# Patient Record
Sex: Male | Born: 1940 | Race: White | Hispanic: No | Marital: Married | Smoking: Never smoker
Health system: Southern US, Community
[De-identification: ages and names within clinical notes are randomized; demographics above are authoritative.]

## PROBLEM LIST (undated history)

## (undated) DIAGNOSIS — M5416 Radiculopathy, lumbar region: Secondary | ICD-10-CM

## (undated) DIAGNOSIS — N62 Hypertrophy of breast: Secondary | ICD-10-CM

## (undated) DIAGNOSIS — I499 Cardiac arrhythmia, unspecified: Secondary | ICD-10-CM

## (undated) DIAGNOSIS — I10 Essential (primary) hypertension: Secondary | ICD-10-CM

## (undated) DIAGNOSIS — M1990 Unspecified osteoarthritis, unspecified site: Secondary | ICD-10-CM

## (undated) DIAGNOSIS — I219 Acute myocardial infarction, unspecified: Secondary | ICD-10-CM

## (undated) DIAGNOSIS — R7303 Prediabetes: Secondary | ICD-10-CM

## (undated) DIAGNOSIS — E278 Other specified disorders of adrenal gland: Secondary | ICD-10-CM

## (undated) DIAGNOSIS — I2510 Atherosclerotic heart disease of native coronary artery without angina pectoris: Secondary | ICD-10-CM

## (undated) DIAGNOSIS — J189 Pneumonia, unspecified organism: Secondary | ICD-10-CM

## (undated) DIAGNOSIS — Z973 Presence of spectacles and contact lenses: Secondary | ICD-10-CM

## (undated) DIAGNOSIS — I9789 Other postprocedural complications and disorders of the circulatory system, not elsewhere classified: Secondary | ICD-10-CM

## (undated) DIAGNOSIS — I4891 Unspecified atrial fibrillation: Secondary | ICD-10-CM

## (undated) DIAGNOSIS — M4800 Spinal stenosis, site unspecified: Secondary | ICD-10-CM

## (undated) DIAGNOSIS — M26609 Unspecified temporomandibular joint disorder, unspecified side: Secondary | ICD-10-CM

## (undated) HISTORY — PX: COLONOSCOPY: SHX174

## (undated) HISTORY — DX: Unspecified atrial fibrillation: I48.91

## (undated) HISTORY — DX: Spinal stenosis, site unspecified: M48.00

## (undated) HISTORY — DX: Acute myocardial infarction, unspecified: I21.9

## (undated) HISTORY — PX: EYE SURGERY: SHX253

## (undated) HISTORY — DX: Essential (primary) hypertension: I10

## (undated) HISTORY — PX: TONSILLECTOMY: SUR1361

## (undated) HISTORY — PX: VASECTOMY: SHX75

## (undated) HISTORY — DX: Hypertrophy of breast: N62

## (undated) HISTORY — DX: Atherosclerotic heart disease of native coronary artery without angina pectoris: I25.10

## (undated) HISTORY — DX: Unspecified temporomandibular joint disorder, unspecified side: M26.609

## (undated) HISTORY — DX: Other postprocedural complications and disorders of the circulatory system, not elsewhere classified: I97.89

---

## 1991-01-26 HISTORY — PX: CERVICAL FUSION: SHX112

## 1993-01-25 HISTORY — PX: CHOLECYSTECTOMY: SHX55

## 1999-06-10 ENCOUNTER — Ambulatory Visit (HOSPITAL_COMMUNITY): Admission: RE | Admit: 1999-06-10 | Discharge: 1999-06-10 | Payer: Self-pay | Admitting: Pulmonary Disease

## 1999-06-15 ENCOUNTER — Ambulatory Visit (HOSPITAL_COMMUNITY): Admission: RE | Admit: 1999-06-15 | Discharge: 1999-06-15 | Payer: Self-pay | Admitting: Pulmonary Disease

## 1999-09-18 ENCOUNTER — Encounter: Admission: RE | Admit: 1999-09-18 | Discharge: 1999-09-18 | Payer: Self-pay | Admitting: Nephrology

## 1999-09-18 ENCOUNTER — Encounter: Payer: Self-pay | Admitting: Nephrology

## 2001-01-12 ENCOUNTER — Ambulatory Visit (HOSPITAL_COMMUNITY): Admission: RE | Admit: 2001-01-12 | Discharge: 2001-01-12 | Payer: Self-pay | Admitting: Pulmonary Disease

## 2003-01-28 ENCOUNTER — Encounter: Admission: RE | Admit: 2003-01-28 | Discharge: 2003-01-28 | Payer: Self-pay | Admitting: Nephrology

## 2003-05-21 ENCOUNTER — Encounter: Admission: RE | Admit: 2003-05-21 | Discharge: 2003-05-21 | Payer: Self-pay | Admitting: Nephrology

## 2005-06-23 ENCOUNTER — Ambulatory Visit (HOSPITAL_COMMUNITY): Admission: RE | Admit: 2005-06-23 | Discharge: 2005-06-23 | Payer: Self-pay | Admitting: Nephrology

## 2008-01-10 ENCOUNTER — Ambulatory Visit (HOSPITAL_COMMUNITY): Admission: RE | Admit: 2008-01-10 | Discharge: 2008-01-10 | Payer: Self-pay | Admitting: Orthopaedic Surgery

## 2009-11-21 ENCOUNTER — Encounter: Payer: Self-pay | Admitting: Internal Medicine

## 2009-12-01 ENCOUNTER — Ambulatory Visit (HOSPITAL_COMMUNITY): Admission: RE | Admit: 2009-12-01 | Discharge: 2009-12-01 | Payer: Self-pay | Admitting: Internal Medicine

## 2010-02-26 NOTE — Letter (Signed)
Summary: [facility] ORDER/[facility]IAGE  [facility] ORDER/TRIAGE   Imported By: Rexene Alberts 11/21/2009 11:51:09  _____________________________________________________________________  External Attachment:    Type:   Image     Comment:   External Document

## 2010-11-11 ENCOUNTER — Other Ambulatory Visit: Payer: Self-pay | Admitting: Nephrology

## 2010-11-11 DIAGNOSIS — E278 Other specified disorders of adrenal gland: Secondary | ICD-10-CM

## 2010-11-12 ENCOUNTER — Other Ambulatory Visit: Payer: Self-pay

## 2010-11-17 ENCOUNTER — Ambulatory Visit
Admission: RE | Admit: 2010-11-17 | Discharge: 2010-11-17 | Disposition: A | Discharge status: Home / Self Care | Payer: Medicare Other | Source: Ambulatory Visit | Attending: Nephrology | Admitting: Nephrology

## 2010-11-17 DIAGNOSIS — E278 Other specified disorders of adrenal gland: Secondary | ICD-10-CM

## 2011-03-16 ENCOUNTER — Encounter: Payer: Self-pay | Admitting: Cardiology

## 2011-03-16 ENCOUNTER — Encounter: Payer: Self-pay | Admitting: Pulmonary Disease

## 2011-03-22 ENCOUNTER — Ambulatory Visit (HOSPITAL_COMMUNITY)
Admission: RE | Admit: 2011-03-22 | Discharge: 2011-03-22 | Disposition: A | Discharge status: Home / Self Care | Payer: Medicare Other | Source: Ambulatory Visit | Attending: Pulmonary Disease | Admitting: Pulmonary Disease

## 2011-03-22 DIAGNOSIS — R002 Palpitations: Secondary | ICD-10-CM | POA: Insufficient documentation

## 2011-03-22 DIAGNOSIS — I4949 Other premature depolarization: Secondary | ICD-10-CM

## 2011-03-22 NOTE — Progress Notes (Signed)
*  PRELIMINARY RESULTS* Echocardiogram 48H Holter monitor has been performed.  Conrad Everson 03/22/2011, 3:16 PM

## 2011-03-29 ENCOUNTER — Ambulatory Visit (INDEPENDENT_AMBULATORY_CARE_PROVIDER_SITE_OTHER): Payer: Medicare Other | Admitting: Cardiology

## 2011-03-29 ENCOUNTER — Encounter: Payer: Self-pay | Admitting: Cardiology

## 2011-03-29 VITALS — BP 155/89 | HR 74 | Resp 16 | Ht 67.0 in | Wt 174.0 lb

## 2011-03-29 DIAGNOSIS — I10 Essential (primary) hypertension: Secondary | ICD-10-CM | POA: Insufficient documentation

## 2011-03-29 DIAGNOSIS — R9431 Abnormal electrocardiogram [ECG] [EKG]: Secondary | ICD-10-CM

## 2011-03-29 DIAGNOSIS — R072 Precordial pain: Secondary | ICD-10-CM | POA: Insufficient documentation

## 2011-03-29 DIAGNOSIS — E782 Mixed hyperlipidemia: Secondary | ICD-10-CM

## 2011-03-29 DIAGNOSIS — I493 Ventricular premature depolarization: Secondary | ICD-10-CM

## 2011-03-29 DIAGNOSIS — I4949 Other premature depolarization: Secondary | ICD-10-CM

## 2011-03-29 NOTE — Assessment & Plan Note (Signed)
Symptomatic, relatively infrequent by recent monitoring, some bigeminy and couplets noted. 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 Echocardiogram to be obtained for cardiac structural assessment.

## 2011-03-29 NOTE — Assessment & Plan Note (Signed)
Recently started on Lipitor by Dr. Juanetta Gosling.

## 2011-03-29 NOTE — Assessment & Plan Note (Signed)
Also history of hyperaldosteronism. Recent medical regimen stable.

## 2011-03-29 NOTE — Progress Notes (Signed)
   Clinical Summary Christopher Booth is a 71 y.o.male referred for cardiology consultation by Dr. Juanetta Gosling. He reports episodes of heart skipping, describes bigeminy and trigeminy, usually when he is at rest. No dizziness or syncope. He also feels a chest discomfort sometimes after activity, feels like he is sighing more. He does not notice the palpitations with activity.  He is a retired Garment/textile technologist, previously active with exercise, including competitive roller skating. Now does not have a regular exercise regimen. He takes care of his wife, who had a large stroke.  Holter monitor was arranged by Dr. Juanetta Gosling showing sinus rhythm with rare PACs and infrequent PVCs, less than 1% total beats. There was some bigeminy and couplets. We discussed this today. WE also reviewed his ECG.  Lab work from February showed hemoglobin 15.1, platelets 266, potassium 3.7, BUN 17, creatinine 0.9, sodium 138, cholesterol 240, triglycerides 150, HDL 42, LDL 168, AST 23, ALT 28, TSH 1.3. He is now on Lipitor.   Allergies  Allergen Reactions  . Diovan (Valsartan)     Current Outpatient Prescriptions  Medication Sig Dispense Refill  . atorvastatin (LIPITOR) 20 MG tablet Take 20 mg by mouth daily.      Marland Kitchen eplerenone (INSPRA) 25 MG tablet Take 25 mg by mouth daily.      Marland Kitchen telmisartan-hydrochlorothiazide (MICARDIS HCT) 80-12.5 MG per tablet Take 1 tablet by mouth daily.        Past Medical History  Diagnosis Date  . Temporomandibular joint disease   . Hyperaldosteronism   . Spinal stenosis   . Essential hypertension, benign   . Gynecomastia     Past Surgical History  Procedure Date  . Colonoscopy   . Cervical fusion   . Cholecystectomy     Family History  Problem Relation Age of Onset  . Hypertension    . Cancer Brother     Colon  . Cancer Brother     Melanoma    Social History [name] reports that he has never smoked. He has never used smokeless tobacco. [name] reports that he drinks  alcohol.  Review of Systems Some neuropathic discomfort left leg. No orthopnea or PND. Stable appetite. No  Bleeding problems - reports negative screening colonoscopy.  Physical Examination Filed Vitals:   03/29/11 0922  BP: 155/89  Pulse: 74  Resp: 16   Normally nourished male in NAD. HEENT: Conjunctiva and lids normal, oropharynx clear. Neck: Supple, no elevated JVP or carotid bruits, no thyromegaly. Lungs: Clear to auscultation, nonlabored breathing at rest. Cardiac: Regular rate and rhythm with occasional ectopic beat, no S3 or significant systolic murmur, no pericardial rub. Abdomen: Soft, nontender, bowel sounds present, no guarding or rebound. Extremities: No pitting edema, distal pulses 2+. Skin: Warm and dry. Musculoskeletal: No kyphosis. Neuropsychiatric: Alert and oriented x3, affect grossly appropriate.   ECG SInus rhythm with PVC, LAFB, NSST changes.    Problem List and Plan

## 2011-03-29 NOTE — Patient Instructions (Signed)
**Note De-Identified Quinisha Mould Obfuscation** Your physician has requested that you have an echocardiogram. Echocardiography is a painless test that uses sound waves to create images of your heart. It provides your doctor with information about the size and shape of your heart and how well your heart's chambers and valves are working. This procedure takes approximately one hour. There are no restrictions for this procedure.  Your physician has requested that you have en exercise stress myoview. For further information please visit [email or web address] Please follow instruction sheet, as given.  Your physician recommends that you continue on your current medications as directed. Please refer to the Current Medication list given to you today.  Your physician recommends that you schedule a follow-up appointment in: 2 to 3 weeks

## 2011-03-29 NOTE — Assessment & Plan Note (Signed)
Describes a vague chest discomfort, usually after activity, not necessarily noticed with exertion. He is not exercising regularly at this point as noted above. Reports a stress test around age 71, no followup ischemic evaluation with long-standing hypertension also in the setting of hyperaldosteronism, and recently documented hyperlipidemia, placed on Lipitor. Plan is to proceed with an exercise Myoview. Office followup arranged to review.

## 2011-03-30 ENCOUNTER — Encounter (HOSPITAL_COMMUNITY)
Admission: RE | Admit: 2011-03-30 | Discharge: 2011-03-30 | Disposition: A | Discharge status: Home / Self Care | Payer: Medicare Other | Source: Ambulatory Visit | Attending: Cardiology | Admitting: Cardiology

## 2011-03-30 ENCOUNTER — Ambulatory Visit (INDEPENDENT_AMBULATORY_CARE_PROVIDER_SITE_OTHER): Payer: Medicare Other

## 2011-03-30 ENCOUNTER — Ambulatory Visit (HOSPITAL_COMMUNITY)
Admission: RE | Admit: 2011-03-30 | Discharge: 2011-03-30 | Disposition: A | Discharge status: Home / Self Care | Payer: Medicare Other | Source: Ambulatory Visit | Attending: Cardiology | Admitting: Cardiology

## 2011-03-30 ENCOUNTER — Encounter (HOSPITAL_COMMUNITY): Payer: Self-pay

## 2011-03-30 DIAGNOSIS — R072 Precordial pain: Secondary | ICD-10-CM

## 2011-03-30 DIAGNOSIS — I10 Essential (primary) hypertension: Secondary | ICD-10-CM | POA: Insufficient documentation

## 2011-03-30 DIAGNOSIS — I517 Cardiomegaly: Secondary | ICD-10-CM

## 2011-03-30 DIAGNOSIS — I493 Ventricular premature depolarization: Secondary | ICD-10-CM

## 2011-03-30 DIAGNOSIS — E785 Hyperlipidemia, unspecified: Secondary | ICD-10-CM | POA: Insufficient documentation

## 2011-03-30 DIAGNOSIS — R0609 Other forms of dyspnea: Secondary | ICD-10-CM | POA: Insufficient documentation

## 2011-03-30 DIAGNOSIS — R079 Chest pain, unspecified: Secondary | ICD-10-CM | POA: Insufficient documentation

## 2011-03-30 DIAGNOSIS — R0989 Other specified symptoms and signs involving the circulatory and respiratory systems: Secondary | ICD-10-CM | POA: Insufficient documentation

## 2011-03-30 MED ORDER — TECHNETIUM TC 99M TETROFOSMIN IV KIT
30.0000 | PACK | Freq: Once | INTRAVENOUS | Status: AC | PRN
  Administered 2011-03-30: 30 via INTRAVENOUS

## 2011-03-30 MED ORDER — TECHNETIUM TC 99M TETROFOSMIN IV KIT
10.0000 | PACK | Freq: Once | INTRAVENOUS | Status: AC | PRN
  Administered 2011-03-30: 10 via INTRAVENOUS

## 2011-03-30 NOTE — Progress Notes (Signed)
Stress Lab Nurses Notes - Christopher Booth 03/30/2011  Reason for doing test: Arrhythmia/Palpitations and Chest Pain Type of test: Stress Myoview Nurse performing test: Parke Poisson, RN Nuclear Medicine Tech: Lou Cal Echo Tech: Not Applicable MD performing test: Ival Bible & Joni Reining NP Family MD: Juanetta Gosling Test explained and consent signed: yes IV started: 22g jelco, Saline lock flushed, No redness or edema and Saline lock started in radiology Symptoms:  fatigue Treatment/Intervention: None Reason test stopped: fatigue After recovery IV was: Discontinued via X-ray tech and No redness or edema Patient to return to Nuc. Med at : 12:15 Patient discharged: Home Patient's Condition upon discharge was: stable Comments: During test peak BP 178/88 & HR 137.  Recovery BP 148/88 & HR 98.  Symptoms resolved in recovery.  Erskine Speed T

## 2011-03-30 NOTE — Progress Notes (Signed)
*  PRELIMINARY RESULTS* Echocardiogram 2D Echocardiogram has been performed.  Christopher Booth 03/30/2011, 8:21 AM

## 2011-04-13 ENCOUNTER — Ambulatory Visit (INDEPENDENT_AMBULATORY_CARE_PROVIDER_SITE_OTHER): Payer: Medicare Other | Admitting: Cardiology

## 2011-04-13 ENCOUNTER — Encounter: Payer: Self-pay | Admitting: Cardiology

## 2011-04-13 VITALS — BP 144/84 | HR 74 | Resp 16 | Ht 67.0 in | Wt 173.0 lb

## 2011-04-13 DIAGNOSIS — I493 Ventricular premature depolarization: Secondary | ICD-10-CM

## 2011-04-13 DIAGNOSIS — I4949 Other premature depolarization: Secondary | ICD-10-CM

## 2011-04-13 DIAGNOSIS — R072 Precordial pain: Secondary | ICD-10-CM

## 2011-04-13 NOTE — Patient Instructions (Signed)
**Note De-identified Colton Tassin Obfuscation** Your physician recommends that you continue on your current medications as directed. Please refer to the Current Medication list given to you today.  Your physician recommends that you schedule a follow-up appointment in: as needed  

## 2011-04-13 NOTE — Progress Notes (Signed)
   Clinical Summary Christopher Booth is a 71 y.o.male presenting for followup. He was seen earlier in March. Followup testing was arranged to further evaluate intermittent chest pain, palpitations and documented PVCs. He is here with his wife.  Echocardiogram showed LVEF 60-65% with mild BSH, no wall motion abnomalitiies, grade 1 diastolic dysfunction. Exercise Myoview showed no inducible VT, no diagnostic ST changes, and no perfusion evidence of scar or ischemia. We discussed these results today.  He reports that he has been doing better, actually in the interim switched back to previous non-generic ARB, and now has no PVC's. Not sure if this is coincidental or not.   Allergies  Allergen Reactions  . Diovan (Valsartan)     Current Outpatient Prescriptions  Medication Sig Dispense Refill  . atorvastatin (LIPITOR) 20 MG tablet Take 20 mg by mouth daily.      Marland Kitchen eplerenone (INSPRA) 25 MG tablet Take 50 mg by mouth 2 (two) times daily.       . hydrochlorothiazide (HYDRODIURIL) 25 MG tablet Take 25 mg by mouth daily.      Marland Kitchen losartan (COZAAR) 100 MG tablet Take 100 mg by mouth 2 (two) times daily.      . Multiple Vitamins-Minerals (MULTI COMPLETE PO) Take by mouth daily.        Past Medical History  Diagnosis Date  . Temporomandibular joint disease   . Hyperaldosteronism   . Spinal stenosis   . Essential hypertension, benign   . Gynecomastia     Past Surgical History  Procedure Date  . Colonoscopy   . Cervical fusion   . Cholecystectomy     Family History  Problem Relation Age of Onset  . Hypertension    . Cancer Brother     Colon  . Cancer Brother     Melanoma    Social History Christopher Booth reports that he has never smoked. He has never used smokeless tobacco. Christopher Booth reports that he drinks alcohol.  Review of Systems Negative except as outlined.  Physical Examination Filed Vitals:   04/13/11 0951  BP: 144/84  Pulse: 74  Resp: 16    Normally nourished male in NAD.   HEENT: Conjunctiva and lids normal, oropharynx clear.  Neck: Supple, no elevated JVP or carotid bruits, no thyromegaly.  Lungs: Clear to auscultation, nonlabored breathing at rest.  Cardiac: Regular rate and rhythm with occasional ectopic beat, no S3 or significant systolic murmur, no pericardial rub.  Abdomen: Soft, nontender, bowel sounds present, no guarding or rebound.  Extremities: No pitting edema, distal pulses 2+.     Problem List and Plan

## 2011-04-13 NOTE — Assessment & Plan Note (Signed)
Recent cardiac testing shows normal LVEF and no inducible VT or increased frequency of PVC's with exercise. He actually reports improvement in PVC's since changing ARB formulation - not sure if coincidental or not. No further cardiac testing planned at this point. Can followup as needed.

## 2011-04-13 NOTE — Assessment & Plan Note (Signed)
Also resolved - no ischemia by recent Myoview. Could consider baby ASA daily. Observation for now. Keep followup with Dr. Juanetta Gosling.

## 2013-02-02 ENCOUNTER — Other Ambulatory Visit: Payer: Self-pay | Admitting: Orthopedic Surgery

## 2013-02-02 DIAGNOSIS — M25531 Pain in right wrist: Secondary | ICD-10-CM

## 2013-02-13 ENCOUNTER — Ambulatory Visit
Admission: RE | Admit: 2013-02-13 | Discharge: 2013-02-13 | Disposition: A | Discharge status: Home / Self Care | Payer: Medicare Other | Source: Ambulatory Visit | Attending: Orthopedic Surgery | Admitting: Orthopedic Surgery

## 2013-02-13 DIAGNOSIS — M25531 Pain in right wrist: Secondary | ICD-10-CM

## 2013-02-13 MED ORDER — IOHEXOL 180 MG/ML  SOLN
3.0000 mL | Freq: Once | INTRAMUSCULAR | Status: AC | PRN
  Administered 2013-02-13: 3 mL via INTRA_ARTICULAR

## 2013-08-30 ENCOUNTER — Ambulatory Visit (HOSPITAL_COMMUNITY)
Admission: RE | Admit: 2013-08-30 | Discharge: 2013-08-30 | Disposition: A | Discharge status: Home / Self Care | Payer: Medicare Other | Source: Ambulatory Visit | Attending: Pulmonary Disease | Admitting: Pulmonary Disease

## 2013-08-30 ENCOUNTER — Other Ambulatory Visit (HOSPITAL_COMMUNITY): Payer: Self-pay | Admitting: Pulmonary Disease

## 2013-08-30 DIAGNOSIS — M79604 Pain in right leg: Secondary | ICD-10-CM

## 2013-08-30 DIAGNOSIS — M79609 Pain in unspecified limb: Secondary | ICD-10-CM | POA: Insufficient documentation

## 2013-08-30 DIAGNOSIS — M7120 Synovial cyst of popliteal space [Baker], unspecified knee: Secondary | ICD-10-CM | POA: Diagnosis not present

## 2013-08-30 DIAGNOSIS — R609 Edema, unspecified: Secondary | ICD-10-CM

## 2013-08-30 DIAGNOSIS — M7989 Other specified soft tissue disorders: Secondary | ICD-10-CM | POA: Diagnosis not present

## 2013-09-28 ENCOUNTER — Other Ambulatory Visit: Payer: Self-pay | Admitting: Orthopaedic Surgery

## 2013-10-03 ENCOUNTER — Encounter (HOSPITAL_BASED_OUTPATIENT_CLINIC_OR_DEPARTMENT_OTHER): Payer: Self-pay | Admitting: *Deleted

## 2013-10-03 NOTE — Progress Notes (Signed)
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

## 2013-10-04 ENCOUNTER — Encounter (HOSPITAL_BASED_OUTPATIENT_CLINIC_OR_DEPARTMENT_OTHER)
Admission: RE | Admit: 2013-10-04 | Discharge: 2013-10-04 | Disposition: A | Discharge status: Home / Self Care | Payer: Medicare Other | Source: Ambulatory Visit | Attending: Orthopaedic Surgery | Admitting: Orthopaedic Surgery

## 2013-10-04 DIAGNOSIS — M23302 Other meniscus derangements, unspecified lateral meniscus, unspecified knee: Secondary | ICD-10-CM | POA: Diagnosis present

## 2013-10-04 DIAGNOSIS — Z01810 Encounter for preprocedural cardiovascular examination: Secondary | ICD-10-CM | POA: Insufficient documentation

## 2013-10-04 DIAGNOSIS — M129 Arthropathy, unspecified: Secondary | ICD-10-CM | POA: Diagnosis not present

## 2013-10-04 DIAGNOSIS — X58XXXA Exposure to other specified factors, initial encounter: Secondary | ICD-10-CM | POA: Diagnosis not present

## 2013-10-04 DIAGNOSIS — IMO0002 Reserved for concepts with insufficient information to code with codable children: Secondary | ICD-10-CM | POA: Diagnosis not present

## 2013-10-04 DIAGNOSIS — M1710 Unilateral primary osteoarthritis, unspecified knee: Secondary | ICD-10-CM | POA: Diagnosis present

## 2013-10-04 DIAGNOSIS — E269 Hyperaldosteronism, unspecified: Secondary | ICD-10-CM | POA: Diagnosis not present

## 2013-10-04 DIAGNOSIS — S83289A Other tear of lateral meniscus, current injury, unspecified knee, initial encounter: Secondary | ICD-10-CM | POA: Diagnosis present

## 2013-10-04 DIAGNOSIS — I10 Essential (primary) hypertension: Secondary | ICD-10-CM | POA: Diagnosis not present

## 2013-10-04 LAB — BASIC METABOLIC PANEL
Anion gap: 13 (ref 5–15)
BUN: 16 mg/dL (ref 6–23)
CHLORIDE: 99 meq/L (ref 96–112)
CO2: 31 mEq/L (ref 19–32)
Calcium: 9.5 mg/dL (ref 8.4–10.5)
Creatinine, Ser: 0.9 mg/dL (ref 0.50–1.35)
GFR calc Af Amer: >90 mL/min (ref >90)
GFR calc non Af Amer: 83 mL/min — ABNORMAL LOW (ref >90)
Glucose, Bld: 123 mg/dL — ABNORMAL HIGH (ref 70–99)
POTASSIUM: 3.5 meq/L — AB (ref 3.7–5.3)
SODIUM: 143 meq/L (ref 137–147)

## 2013-10-05 NOTE — H&P (Signed)
Christopher Booth is an 73 y.o. male.   Chief Complaint: right knee pain HPI: Christopher Booth continues with right knee swelling and pain.  He does not fish anymore as this knee bothers him.  He feels a catch.  We injected him about 5 years ago.  He is interested in what we can do to make this better.  He is also had an MRI scan done which shows some meniscus tear along with some degenerative changes.  He is interested in a knee arthroscopy at this time.  MRI:  I reviewed an MRI scan films and report of a study done at the [facility] on 09/17/13.  This shows a complex tear of the lateral meniscus and some advanced lateral compartment degenerative change.  Past Medical History  Diagnosis Date  . Temporomandibular joint disease   . Hyperaldosteronism   . Spinal stenosis   . Essential hypertension, benign   . Gynecomastia   . Arthritis   . Wears glasses     Past Surgical History  Procedure Laterality Date  . Colonoscopy    . Cervical fusion  1993  . Cholecystectomy  1995  . Tonsillectomy      Family History  Problem Relation Age of Onset  . Hypertension    . Cancer Brother     Colon  . Cancer Brother     Melanoma   Social History:  reports that he has never smoked. He has never used smokeless tobacco. He reports that he drinks alcohol. He reports that he does not use illicit drugs.  Allergies:  Allergies  Allergen Reactions  . Diovan [Valsartan]     unknown    No prescriptions prior to admission    Results for orders placed during the hospital encounter of 10/09/13 (from the past 48 hour(s))  BASIC METABOLIC PANEL     Status: Abnormal   Collection Time    10/04/13 11:30 AM      Result Value Ref Range   Sodium 143  137 - 147 mEq/L   Potassium 3.5 (*) 3.7 - 5.3 mEq/L   Chloride 99  96 - 112 mEq/L   CO2 31  19 - 32 mEq/L   Glucose, Bld 123 (*) 70 - 99 mg/dL   BUN 16  6 - 23 mg/dL   Creatinine, Ser 0.90  0.50 - 1.35 mg/dL   Calcium 9.5  8.4 - 10.5 mg/dL   GFR calc non Af Amer 83  (*) >90 mL/min   GFR calc Af Amer >90  >90 mL/min   Comment: (NOTE)     The eGFR has been calculated using the CKD EPI equation.     This calculation has not been validated in all clinical situations.     eGFR's persistently <90 mL/min signify possible Chronic Kidney     Disease.   Anion gap 13  5 - 15   No results found.  Review of Systems  Musculoskeletal: Positive for joint pain.       Right knee  All other systems reviewed and are negative.   Height '5\' 7"'  (1.702 m), weight 78.472 kg (173 lb). Physical Exam  Constitutional: He is oriented to person, place, and time. He appears well-developed and well-nourished.  HENT:  Head: Normocephalic and atraumatic.  Eyes: Conjunctivae are normal. Pupils are equal, round, and reactive to light.  Neck: Normal range of motion.  Cardiovascular: Normal rate and regular rhythm.   Respiratory: Effort normal.  GI: Soft.  Musculoskeletal:  Right knee persists with  trace effusion.  His motion is 0-130.  He has pain along the medial and lateral joint lines.  He has pain to lateral McMurray testing.  Ligament exam is stable.  Hip motion is full and painfree and SLR is negative on both sides.  There is no palpable LAD behind either knee.  Sensation and motor function are intact on both sides and there are palpable pulses on both sides.    Neurological: He is alert and oriented to person, place, and time.  Skin: Skin is warm and dry.  Psychiatric: He has a normal mood and affect. His behavior is normal. Judgment and thought content normal.     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  He has swelling and pain which limits his ability to remain active and keeps him from fishing.  I reviewed risks of anesthesia and infection as well as potential for DVT related to a knee arthroscopy.  I've stressed the importance of some  postoperative physical therapy to optimize results and we will try to set up an appointment.  Two to four  weeks for recovery would be typical but that is a little variable.    Ersel Enslin, Larwance Sachs 10/05/2013, 5:00 PM

## 2013-10-09 ENCOUNTER — Encounter (HOSPITAL_BASED_OUTPATIENT_CLINIC_OR_DEPARTMENT_OTHER): Payer: Medicare Other | Admitting: Certified Registered"

## 2013-10-09 ENCOUNTER — Ambulatory Visit (HOSPITAL_BASED_OUTPATIENT_CLINIC_OR_DEPARTMENT_OTHER): Payer: Medicare Other | Admitting: Certified Registered"

## 2013-10-09 ENCOUNTER — Ambulatory Visit (HOSPITAL_BASED_OUTPATIENT_CLINIC_OR_DEPARTMENT_OTHER)
Admission: RE | Admit: 2013-10-09 | Discharge: 2013-10-09 | Disposition: A | Discharge status: Home / Self Care | Payer: Medicare Other | Source: Ambulatory Visit | Attending: Orthopaedic Surgery | Admitting: Orthopaedic Surgery

## 2013-10-09 ENCOUNTER — Encounter (HOSPITAL_BASED_OUTPATIENT_CLINIC_OR_DEPARTMENT_OTHER): Payer: Self-pay | Admitting: Anesthesiology

## 2013-10-09 ENCOUNTER — Encounter (HOSPITAL_BASED_OUTPATIENT_CLINIC_OR_DEPARTMENT_OTHER)
Admission: RE | Disposition: A | Discharge status: Home / Self Care | Payer: Self-pay | Source: Ambulatory Visit | Attending: Orthopaedic Surgery

## 2013-10-09 DIAGNOSIS — S83206A Unspecified tear of unspecified meniscus, current injury, right knee, initial encounter: Secondary | ICD-10-CM

## 2013-10-09 DIAGNOSIS — E269 Hyperaldosteronism, unspecified: Secondary | ICD-10-CM | POA: Insufficient documentation

## 2013-10-09 DIAGNOSIS — S83289A Other tear of lateral meniscus, current injury, unspecified knee, initial encounter: Secondary | ICD-10-CM | POA: Insufficient documentation

## 2013-10-09 DIAGNOSIS — X58XXXA Exposure to other specified factors, initial encounter: Secondary | ICD-10-CM | POA: Insufficient documentation

## 2013-10-09 DIAGNOSIS — IMO0002 Reserved for concepts with insufficient information to code with codable children: Secondary | ICD-10-CM | POA: Insufficient documentation

## 2013-10-09 DIAGNOSIS — I10 Essential (primary) hypertension: Secondary | ICD-10-CM | POA: Insufficient documentation

## 2013-10-09 DIAGNOSIS — M129 Arthropathy, unspecified: Secondary | ICD-10-CM | POA: Insufficient documentation

## 2013-10-09 DIAGNOSIS — M1710 Unilateral primary osteoarthritis, unspecified knee: Secondary | ICD-10-CM | POA: Diagnosis not present

## 2013-10-09 HISTORY — DX: Presence of spectacles and contact lenses: Z97.3

## 2013-10-09 HISTORY — DX: Unspecified osteoarthritis, unspecified site: M19.90

## 2013-10-09 HISTORY — PX: KNEE ARTHROSCOPY: SHX127

## 2013-10-09 LAB — POCT HEMOGLOBIN-HEMACUE: Hemoglobin: 15.9 g/dL (ref 13.0–17.0)

## 2013-10-09 SURGERY — ARTHROSCOPY, KNEE
Anesthesia: Monitor Anesthesia Care | Site: Knee | Laterality: Right

## 2013-10-09 MED ORDER — MORPHINE SULFATE 4 MG/ML IJ SOLN
INTRAMUSCULAR | Status: AC
  Filled 2013-10-09: qty 1

## 2013-10-09 MED ORDER — BUPIVACAINE-EPINEPHRINE (PF) 0.5% -1:200000 IJ SOLN
INTRAMUSCULAR | Status: DC | PRN
  Administered 2013-10-09: 30 mL via PERINEURAL

## 2013-10-09 MED ORDER — FENTANYL CITRATE 0.05 MG/ML IJ SOLN: 50.0000 ug | INTRAMUSCULAR | Status: DC | PRN

## 2013-10-09 MED ORDER — CEFAZOLIN SODIUM-DEXTROSE 2-3 GM-% IV SOLR
INTRAVENOUS | Status: AC
  Filled 2013-10-09: qty 50

## 2013-10-09 MED ORDER — MIDAZOLAM HCL 2 MG/2ML IJ SOLN: 1.0000 mg | INTRAMUSCULAR | Status: DC | PRN

## 2013-10-09 MED ORDER — FENTANYL CITRATE 0.05 MG/ML IJ SOLN
INTRAMUSCULAR | Status: AC
  Filled 2013-10-09: qty 6

## 2013-10-09 MED ORDER — METHYLPREDNISOLONE ACETATE 80 MG/ML IJ SUSP
INTRAMUSCULAR | Status: DC | PRN
  Administered 2013-10-09: 80 mg

## 2013-10-09 MED ORDER — BUPIVACAINE-EPINEPHRINE 0.5% -1:200000 IJ SOLN
INTRAMUSCULAR | Status: DC | PRN
  Administered 2013-10-09: 5 mL

## 2013-10-09 MED ORDER — PROPOFOL 10 MG/ML IV BOLUS
INTRAVENOUS | Status: DC | PRN
  Administered 2013-10-09 (×2): 20 mg via INTRAVENOUS

## 2013-10-09 MED ORDER — MIDAZOLAM HCL 5 MG/5ML IJ SOLN
INTRAMUSCULAR | Status: DC | PRN
  Administered 2013-10-09 (×2): 1 mg via INTRAVENOUS

## 2013-10-09 MED ORDER — OXYCODONE HCL 5 MG PO TABS: 5.0000 mg | ORAL_TABLET | Freq: Once | ORAL | Status: DC | PRN

## 2013-10-09 MED ORDER — HYDROMORPHONE HCL PF 1 MG/ML IJ SOLN: 0.2500 mg | INTRAMUSCULAR | Status: DC | PRN

## 2013-10-09 MED ORDER — LACTATED RINGERS IV SOLN
INTRAVENOUS | Status: DC
  Administered 2013-10-09: 15:00:00 via INTRAVENOUS

## 2013-10-09 MED ORDER — OXYCODONE HCL 5 MG/5ML PO SOLN: 5.0000 mg | Freq: Once | ORAL | Status: DC | PRN

## 2013-10-09 MED ORDER — HYDROCODONE-ACETAMINOPHEN 5-325 MG PO TABS: 1.0000 | ORAL_TABLET | Freq: Four times a day (QID) | ORAL | Status: DC | PRN

## 2013-10-09 MED ORDER — MIDAZOLAM HCL 2 MG/2ML IJ SOLN
INTRAMUSCULAR | Status: AC
  Filled 2013-10-09: qty 2

## 2013-10-09 MED ORDER — LACTATED RINGERS IV SOLN: INTRAVENOUS | Status: DC

## 2013-10-09 MED ORDER — METHYLPREDNISOLONE ACETATE 80 MG/ML IJ SUSP
INTRAMUSCULAR | Status: AC
  Filled 2013-10-09: qty 1

## 2013-10-09 MED ORDER — CHLORHEXIDINE GLUCONATE 4 % EX LIQD: 60.0000 mL | Freq: Once | CUTANEOUS | Status: DC

## 2013-10-09 MED ORDER — ONDANSETRON HCL 4 MG/2ML IJ SOLN: 4.0000 mg | Freq: Four times a day (QID) | INTRAMUSCULAR | Status: DC | PRN

## 2013-10-09 MED ORDER — SODIUM CHLORIDE 0.9 % IR SOLN
Status: DC | PRN
  Administered 2013-10-09: 5800 mL

## 2013-10-09 MED ORDER — FENTANYL CITRATE 0.05 MG/ML IJ SOLN
INTRAMUSCULAR | Status: DC | PRN
  Administered 2013-10-09: 50 ug via INTRAVENOUS

## 2013-10-09 MED ORDER — PROPOFOL INFUSION 10 MG/ML OPTIME
INTRAVENOUS | Status: DC | PRN
  Administered 2013-10-09: 100 ug/min via INTRAVENOUS

## 2013-10-09 MED ORDER — BUPIVACAINE-EPINEPHRINE (PF) 0.5% -1:200000 IJ SOLN
INTRAMUSCULAR | Status: AC
  Filled 2013-10-09: qty 30

## 2013-10-09 MED ORDER — ONDANSETRON HCL 4 MG/2ML IJ SOLN
INTRAMUSCULAR | Status: DC | PRN
  Administered 2013-10-09: 4 mg via INTRAVENOUS

## 2013-10-09 MED ORDER — CEFAZOLIN SODIUM-DEXTROSE 2-3 GM-% IV SOLR
2.0000 g | INTRAVENOUS | Status: AC
Start: 2013-10-10 — End: 2013-10-09
  Administered 2013-10-09: 2 g via INTRAVENOUS

## 2013-10-09 SURGICAL SUPPLY — 39 items
BANDAGE ELASTIC 6 VELCRO ST LF (GAUZE/BANDAGES/DRESSINGS) ×2 IMPLANT
BLADE CUDA 5.5 (BLADE) IMPLANT
BLADE GREAT WHITE 4.2 (BLADE) ×2 IMPLANT
BNDG GAUZE ELAST 4 BULKY (GAUZE/BANDAGES/DRESSINGS) ×2 IMPLANT
CANISTER SUCT 3000ML (MISCELLANEOUS) IMPLANT
DRAPE ARTHROSCOPY W/POUCH 114 (DRAPES) ×2 IMPLANT
DRAPE U-SHAPE 47X51 STRL (DRAPES) ×2 IMPLANT
DRSG EMULSION OIL 3X3 NADH (GAUZE/BANDAGES/DRESSINGS) ×2 IMPLANT
DURAPREP 26ML APPLICATOR (WOUND CARE) ×2 IMPLANT
ELECT MENISCUS 165MM 90D (ELECTRODE) IMPLANT
ELECT REM PT RETURN 9FT ADLT (ELECTROSURGICAL)
ELECTRODE REM PT RTRN 9FT ADLT (ELECTROSURGICAL) IMPLANT
GAUZE SPONGE 4X4 12PLY STRL (GAUZE/BANDAGES/DRESSINGS) ×2 IMPLANT
GLOVE BIO SURGEON STRL SZ7.5 (GLOVE) ×2 IMPLANT
GLOVE BIO SURGEON STRL SZ8 (GLOVE) ×4 IMPLANT
GLOVE BIOGEL PI IND STRL 8 (GLOVE) ×5 IMPLANT
GLOVE BIOGEL PI IND STRL 8.5 (GLOVE) IMPLANT
GLOVE BIOGEL PI INDICATOR 8 (GLOVE) ×5
GLOVE BIOGEL PI INDICATOR 8.5 (GLOVE) ×1
GOWN STRL REUS W/ TWL LRG LVL3 (GOWN DISPOSABLE) ×1 IMPLANT
GOWN STRL REUS W/ TWL XL LVL3 (GOWN DISPOSABLE) ×2 IMPLANT
GOWN STRL REUS W/TWL LRG LVL3 (GOWN DISPOSABLE) ×2
GOWN STRL REUS W/TWL XL LVL3 (GOWN DISPOSABLE) ×8
IMMOBILIZER KNEE 22 UNIV (SOFTGOODS) ×2 IMPLANT
IV NS IRRIG 3000ML ARTHROMATIC (IV SOLUTION) IMPLANT
KNEE WRAP E Z 3 GEL PACK (MISCELLANEOUS) ×2 IMPLANT
MANIFOLD NEPTUNE II (INSTRUMENTS) IMPLANT
PACK ARTHROSCOPY DSU (CUSTOM PROCEDURE TRAY) ×2 IMPLANT
PACK BASIN DAY SURGERY FS (CUSTOM PROCEDURE TRAY) ×2 IMPLANT
PENCIL BUTTON HOLSTER BLD 10FT (ELECTRODE) IMPLANT
SET ARTHROSCOPY TUBING (MISCELLANEOUS) ×2
SET ARTHROSCOPY TUBING LN (MISCELLANEOUS) ×1 IMPLANT
SHEET MEDIUM DRAPE 40X70 STRL (DRAPES) ×2 IMPLANT
SYR 3ML 18GX1 1/2 (SYRINGE) IMPLANT
TOWEL OR 17X24 6PK STRL BLUE (TOWEL DISPOSABLE) ×2 IMPLANT
TOWEL OR NON WOVEN STRL DISP B (DISPOSABLE) ×2 IMPLANT
WAND 30 DEG SABER W/CORD (SURGICAL WAND) IMPLANT
WAND STAR VAC 90 (SURGICAL WAND) IMPLANT
WATER STERILE IRR 1000ML POUR (IV SOLUTION) ×2 IMPLANT

## 2013-10-09 NOTE — Interval H&P Note (Signed)
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 After consideration of risks, benefits and other options for treatment, the patient has consented to  Procedure(s): RIGHT ARTHROSCOPY KNEE (Right) as a surgical intervention .  The patient's history has been reviewed, patient examined, no change in status, stable for surgery.  I have reviewed the patient's chart and labs.  Questions were answered to the patient's satisfaction.     Jullien Granquist G

## 2013-10-09 NOTE — Discharge Instructions (Signed)
°  Post Anesthesia Home Care Instructions ° °Activity: °Get plenty of rest for the remainder of the day. A responsible adult should stay with you for 24 hours following the procedure.  °For the next 24 hours, DO NOT: °-Drive a car °-Operate machinery °-Drink alcoholic beverages °-Take any medication unless instructed by your physician °-Make any legal decisions or sign important papers. ° °Meals: °Start with liquid foods such as gelatin or soup. Progress to regular foods as tolerated. Avoid greasy, spicy, heavy foods. If nausea and/or vomiting occur, drink only clear liquids until the nausea and/or vomiting subsides. Call your physician if vomiting continues. ° °Special Instructions/Symptoms: °Your throat may feel dry or sore from the anesthesia or the breathing tube placed in your throat during surgery. If this causes discomfort, gargle with warm salt water. The discomfort should disappear within 24 hours. ° °Regional Anesthesia Blocks ° °1. Numbness or the inability to move the "blocked" extremity may last from 3-48 hours after placement. The length of time depends on the medication injected and your individual response to the medication. If the numbness is not going away after 48 hours, call your surgeon. ° °2. The extremity that is blocked will need to be protected until the numbness is gone and the  Strength has returned. Because you cannot feel it, you will need to take extra care to avoid injury. Because it may be weak, you may have difficulty moving it or using it. You may not know what position it is in without looking at it while the block is in effect. ° °3. For blocks in the legs and feet, returning to weight bearing and walking needs to be done carefully. You will need to wait until the numbness is entirely gone and the strength has returned. You should be able to move your leg and foot normally before you try and bear weight or walk. You will need someone to be with you when you first try to ensure you  do not fall and possibly risk injury. ° °4. Bruising and tenderness at the needle site are common side effects and will resolve in a few days. ° °5. Persistent numbness or new problems with movement should be communicated to the surgeon or the [facility] ([phone])/  Surgery Center ([phone]). °

## 2013-10-09 NOTE — Op Note (Signed)
#  [id number] 

## 2013-10-09 NOTE — Anesthesia Postprocedure Evaluation (Signed)
Anesthesia Post Note  Patient: Christopher Booth  Procedure(s) Performed: Procedure(s) (LRB): RIGHT ARTHROSCOPY KNEE Partial medial and lateral meniscal tear and chondroplasty (Right)  Anesthesia type: MAC and femoral nerve block  Patient location: PACU  Post pain: Pain level controlled and Adequate analgesia  Post assessment: Post-op Vital signs reviewed, Patient's Cardiovascular Status Stable and Respiratory Function Stable  Last Vitals:  Filed Vitals:   10/09/13 1630  BP: 129/68  Pulse: 78  Temp:   Resp: 20    Post vital signs: Reviewed and stable  Level of consciousness: awake, alert  and oriented  Complications: No apparent anesthesia complications

## 2013-10-09 NOTE — Transfer of Care (Signed)
Immediate Anesthesia Transfer of Care Note  Patient: Christopher Booth  Procedure(s) Performed: Procedure(s) with comments: RIGHT ARTHROSCOPY KNEE (Right) - Partial medial and lateral meniscal tear and chondroplasty   Patient Location: PACU  Anesthesia Type:MAC and Regional  Level of Consciousness: awake, alert  and oriented  Airway & Oxygen Therapy: Patient Spontanous Breathing  Post-op Assessment: Report given to PACU RN and Post -op Vital signs reviewed and stable  Post vital signs: Reviewed and stable  Complications: No apparent anesthesia complications

## 2013-10-09 NOTE — Anesthesia Preprocedure Evaluation (Signed)
Anesthesia Evaluation  Patient identified by MRN, date of birth, ID band Patient awake    Reviewed: Allergy & Precautions, H&P , NPO status , Patient's Chart, lab work & pertinent test results  Airway Mallampati: II  Neck ROM: full    Dental   Pulmonary neg pulmonary ROS,          Cardiovascular hypertension,     Neuro/Psych    GI/Hepatic   Endo/Other    Renal/GU      Musculoskeletal  (+) Arthritis -,   Abdominal   Peds  Hematology   Anesthesia Other Findings   Reproductive/Obstetrics                           Anesthesia Physical Anesthesia Plan  ASA: II  Anesthesia Plan: General   Post-op Pain Management:    Induction: Intravenous  Airway Management Planned: LMA  Additional Equipment:   Intra-op Plan:   Post-operative Plan:   Informed Consent: I have reviewed the patients History and Physical, chart, labs and discussed the procedure including the risks, benefits and alternatives for the proposed anesthesia with the patient or authorized representative who has indicated his/her understanding and acceptance.     Plan Discussed with: CRNA, Anesthesiologist and Surgeon  Anesthesia Plan Comments:         Anesthesia Quick Evaluation

## 2013-10-09 NOTE — Anesthesia Procedure Notes (Signed)
Anesthesia Regional Block:  Femoral nerve block  Pre-Anesthetic Checklist: ,, timeout performed, Correct Patient, Correct Site, Correct Laterality, Correct Procedure,, site marked, risks and benefits discussed, Surgical consent,  Pre-op evaluation,  At surgeon's request and post-op pain management  Laterality: Right  Prep: chloraprep       Needles:  Injection technique: Single-shot  Needle Type: Echogenic Stimulator Needle     Needle Length: 9cm 9 cm Needle Gauge: 21 and 21 G    Additional Needles:  Procedures: nerve stimulator Femoral nerve block  Nerve Stimulator or Paresthesia:  Response: Quadriceps muscle contraction, 0.45 mA,   Additional Responses:   Narrative:  Start time: 10/09/2013 3:09 PM End time: 10/09/2013 3:18 PM Injection made incrementally with aspirations every 5 mL.  Performed by: Personally  Anesthesiologist: Dr Marcie Bal  Additional Notes: Functioning IV was confirmed and monitors were applied.  A 63mm 21ga Arrow echogenic stimulator needle was used. Sterile prep and drape,hand hygiene and sterile gloves were used.  Negative aspiration and negative test dose prior to incremental administration of local anesthetic. The patient tolerated the procedure well.

## 2013-10-10 ENCOUNTER — Encounter (HOSPITAL_BASED_OUTPATIENT_CLINIC_OR_DEPARTMENT_OTHER): Payer: Self-pay | Admitting: Orthopaedic Surgery

## 2013-10-10 NOTE — Op Note (Signed)
NAME:  Christopher Booth, DILL NO.:  [id number]  MEDICAL RECORD NO.:  [id number]  LOCATION:                                 FACILITY:  PHYSICIAN:  Monico Blitz. Ricarda Atayde, M.D.DATE OF BIRTH:  Sep 25, 1940  DATE OF PROCEDURE:  10/09/2013 DATE OF DISCHARGE:  10/09/2013                              OPERATIVE REPORT   PREOPERATIVE DIAGNOSES: 1. Right knee torn lateral meniscus. 2. Right knee degenerative joint disease.  POSTOPERATIVE DIAGNOSES: 1. Right knee torn medial and torn lateral meniscus. 2. Right knee degenerative joint disease.  PROCEDURES: 1. Right knee partial medial and partial lateral meniscectomies. 2. Right knee abrasion chondroplasty.  ANESTHESIA:  Femoral nerve block and MAC.  ATTENDING SURGEON:  Monico Blitz. Rhona Raider, M.D.  ASSISTANT:  Loni Dolly, PA.  INDICATION FOR PROCEDURE:  The patient is a 73 year old man who has a very long history of right knee pain and swelling along with mechanical symptoms.  This has persisted despite oral anti-inflammatories and an injection.  He has also been through an exercise program.  By MRI scan, he has some meniscal injury and degenerative change.  His pain limits his ability to walk and rest and at this point he is offered an arthroscopy.  Informed operative consent was obtained after discussion of possible complications including reaction to anesthesia and infection.  The fact that this would likely improve but not completely eliminate his knee issue was stressed extensively with the patient. Informed operative consent was obtained.  SUMMARY OF FINDINGS AND PROCEDURE:  Under femoral nerve block and MAC, a right knee arthroscopy was performed.  Suprapatellar pouch was benign while the patellofemoral joint exhibited some grade 3 and 4 change to the intertrochlear groove.  The chondroplasty was done along with abrasion to bleeding bone with some small areas through the groove.  The medial compartment exhibited a small  posterior horn medial meniscus tear addressed with about a 5% partial medial meniscectomy.  No degenerative change in this compartment.  ACL was normal.  The lateral compartment exhibited a large tear of most of the posterior horn of lateral meniscus which was displaced in a superior direction.  This was removed performing about 25% partial lateral meniscectomy.  Unfortunately, he also had some grade 4 change on both the tibial plateau and lateral femoral condyle.  He was discharged home the same day.  DESCRIPTION OF PROCEDURE:  The patient was taken to the operating suite where a femoral nerve block was applied along with some sedation.  He was positioned in supine and prepped and draped in normal sterile fashion.  After the administration of IV Kefzol and an appropriate time- out, an arthroscopy of the right knee was performed through total of 2 portals.  Findings were as noted above and procedures consisted of the partial medial and partial lateral meniscectomies which were done with baskets and shavers followed by abrasion chondroplasty, as described above.  The knee was thoroughly irrigated followed by placement of Marcaine with epinephrine and some Depo-Medrol.  Adaptic was applied to the portals followed by dry gauze and loose Ace wrap.  ESTIMATED BLOOD LOSS AND FLUIDS:  Obtained from anesthesia records.  DISPOSITION:  The patient was  taken to the recovery room in stable condition.  He was to go home same-day and follow up in the office closely.  I will contact him by phone tonight.     Monico Blitz Rhona Raider, M.D.     PGD/MEDQ  D:  10/09/2013  T:  10/10/2013  Job:  [id number]

## 2013-10-10 NOTE — Addendum Note (Signed)
Addendum created 10/10/13 0740 by Tawni Millers, CRNA   Modules edited: Charges VN

## 2015-01-01 DIAGNOSIS — M25541 Pain in joints of right hand: Secondary | ICD-10-CM | POA: Insufficient documentation

## 2015-01-02 LAB — HM DIABETES EYE EXAM

## 2015-01-14 NOTE — Patient Instructions (Signed)
Your procedure is scheduled on: 01/28/2015  Report to [facility] at  43   AM.  Call this number if you have problems the morning of surgery: [phone]   Do not eat food or drink liquids :After Midnight.      Take these medicines the morning of surgery with A SIP OF WATER: inspra   Do not wear jewelry, make-up or nail polish.  Do not wear lotions, powders, or perfumes. You may wear deodorant.  Do not shave 48 hours prior to surgery.  Do not bring valuables to the hospital.  Contacts, dentures or bridgework may not be worn into surgery.  Leave suitcase in the car. After surgery it may be brought to your room.  For patients admitted to the hospital, checkout time is 11:00 AM the day of discharge.   Patients discharged the day of surgery will not be allowed to drive home.  :     Please read over the following fact sheets that you were given: Coughing and Deep Breathing, Surgical Site Infection Prevention, Anesthesia Post-op Instructions and Care and Recovery After Surgery    Cataract A cataract is a clouding of the lens of the eye. When a lens becomes cloudy, vision is reduced based on the degree and nature of the clouding. Many cataracts reduce vision to some degree. Some cataracts make people more near-sighted as they develop. Other cataracts increase glare. Cataracts that are ignored and become worse can sometimes look white. The white color can be seen through the pupil. CAUSES   Aging. However, cataracts may occur at any age, even in newborns.   Certain drugs.   Trauma to the eye.   Certain diseases such as diabetes.   Specific eye diseases such as chronic inflammation inside the eye or a sudden attack of a rare form of glaucoma.   Inherited or acquired medical problems.  SYMPTOMS   Gradual, progressive drop in vision in the affected eye.   Severe, rapid visual loss. This most often happens when trauma is the cause.  DIAGNOSIS  To detect a cataract, an eye doctor examines  the lens. Cataracts are best diagnosed with an exam of the eyes with the pupils enlarged (dilated) by drops.  TREATMENT  For an early cataract, vision may improve by using different eyeglasses or stronger lighting. If that does not help your vision, surgery is the only effective treatment. A cataract needs to be surgically removed when vision loss interferes with your everyday activities, such as driving, reading, or watching TV. A cataract may also have to be removed if it prevents examination or treatment of another eye problem. Surgery removes the cloudy lens and usually replaces it with a substitute lens (intraocular lens, IOL).  At a time when both you and your doctor agree, the cataract will be surgically removed. If you have cataracts in both eyes, only one is usually removed at a time. This allows the operated eye to heal and be out of danger from any possible problems after surgery (such as infection or poor wound healing). In rare cases, a cataract may be doing damage to your eye. In these cases, your caregiver may advise surgical removal right away. The vast majority of people who have cataract surgery have better vision afterward. HOME CARE INSTRUCTIONS  If you are not planning surgery, you may be asked to do the following:  Use different eyeglasses.   Use stronger or brighter lighting.   Ask your eye doctor about reducing your medicine dose  or changing medicines if it is thought that a medicine caused your cataract. Changing medicines does not make the cataract go away on its own.   Become familiar with your surroundings. Poor vision can lead to injury. Avoid bumping into things on the affected side. You are at a higher risk for tripping or falling.   Exercise extreme care when driving or operating machinery.   Wear sunglasses if you are sensitive to bright light or experiencing problems with glare.  SEEK IMMEDIATE MEDICAL CARE IF:   You have a worsening or sudden vision loss.    You notice redness, swelling, or increasing pain in the eye.   You have a fever.  Document Released: 01/11/2005 Document Revised: 12/31/2010 Document Reviewed: 09/04/2010 [facility] Patient Information 2012 [place].PATIENT INSTRUCTIONS POST-ANESTHESIA  IMMEDIATELY FOLLOWING SURGERY:  Do not drive or operate machinery for the first twenty four hours after surgery.  Do not make any important decisions for twenty four hours after surgery or while taking narcotic pain medications or sedatives.  If you develop intractable nausea and vomiting or a severe headache please notify your doctor immediately.  FOLLOW-UP:  Please make an appointment with your surgeon as instructed. You do not need to follow up with anesthesia unless specifically instructed to do so.  WOUND CARE INSTRUCTIONS (if applicable):  Keep a dry clean dressing on the anesthesia/puncture wound site if there is drainage.  Once the wound has quit draining you may leave it open to air.  Generally you should leave the bandage intact for twenty four hours unless there is drainage.  If the epidural site drains for more than 36-48 hours please call the anesthesia department.  QUESTIONS?:  Please feel free to call your physician or the hospital operator if you have any questions, and they will be happy to assist you.

## 2015-01-15 ENCOUNTER — Other Ambulatory Visit: Payer: Self-pay

## 2015-01-15 ENCOUNTER — Encounter (HOSPITAL_COMMUNITY)
Admission: RE | Admit: 2015-01-15 | Discharge: 2015-01-15 | Disposition: A | Discharge status: Home / Self Care | Payer: PPO | Source: Ambulatory Visit | Attending: Ophthalmology | Admitting: Ophthalmology

## 2015-01-15 ENCOUNTER — Encounter (HOSPITAL_COMMUNITY): Payer: Self-pay

## 2015-01-15 DIAGNOSIS — H2511 Age-related nuclear cataract, right eye: Secondary | ICD-10-CM | POA: Insufficient documentation

## 2015-01-15 DIAGNOSIS — Z01812 Encounter for preprocedural laboratory examination: Secondary | ICD-10-CM | POA: Insufficient documentation

## 2015-01-15 DIAGNOSIS — Z01818 Encounter for other preprocedural examination: Secondary | ICD-10-CM | POA: Diagnosis present

## 2015-01-15 DIAGNOSIS — R9431 Abnormal electrocardiogram [ECG] [EKG]: Secondary | ICD-10-CM | POA: Diagnosis not present

## 2015-01-15 LAB — CBC WITH DIFFERENTIAL/PLATELET
BASOS ABS: 0.1 10*3/uL (ref 0.0–0.1)
BASOS PCT: 1 %
EOS PCT: 7 %
Eosinophils Absolute: 0.6 10*3/uL (ref 0.0–0.7)
HCT: 43.1 % (ref 39.0–52.0)
Hemoglobin: 15 g/dL (ref 13.0–17.0)
Lymphocytes Relative: 24 %
Lymphs Abs: 2.2 10*3/uL (ref 0.7–4.0)
MCH: 30.5 pg (ref 26.0–34.0)
MCHC: 34.8 g/dL (ref 30.0–36.0)
MCV: 87.6 fL (ref 78.0–100.0)
MONO ABS: 0.7 10*3/uL (ref 0.1–1.0)
Monocytes Relative: 7 %
NEUTROS ABS: 5.7 10*3/uL (ref 1.7–7.7)
Neutrophils Relative %: 61 %
PLATELETS: 251 10*3/uL (ref 150–400)
RBC: 4.92 MIL/uL (ref 4.22–5.81)
RDW: 12.6 % (ref 11.5–15.5)
WBC: 9.3 10*3/uL (ref 4.0–10.5)

## 2015-01-15 LAB — BASIC METABOLIC PANEL
ANION GAP: 6 (ref 5–15)
BUN: 18 mg/dL (ref 6–20)
CALCIUM: 9.4 mg/dL (ref 8.9–10.3)
CO2: 34 mmol/L — ABNORMAL HIGH (ref 22–32)
Chloride: 101 mmol/L (ref 101–111)
Creatinine, Ser: 0.92 mg/dL (ref 0.61–1.24)
Glucose, Bld: 126 mg/dL — ABNORMAL HIGH (ref 65–99)
Potassium: 3.2 mmol/L — ABNORMAL LOW (ref 3.5–5.1)
Sodium: 141 mmol/L (ref 135–145)

## 2015-01-15 NOTE — Pre-Procedure Instructions (Signed)
Patient given information to sign up for my chart at home. 

## 2015-01-28 ENCOUNTER — Ambulatory Visit (HOSPITAL_COMMUNITY)
Admission: RE | Admit: 2015-01-28 | Discharge: 2015-01-28 | Disposition: A | Discharge status: Home / Self Care | Payer: PPO | Source: Ambulatory Visit | Attending: Ophthalmology | Admitting: Ophthalmology

## 2015-01-28 ENCOUNTER — Ambulatory Visit (HOSPITAL_COMMUNITY): Payer: PPO | Admitting: Anesthesiology

## 2015-01-28 ENCOUNTER — Encounter (HOSPITAL_COMMUNITY): Payer: Self-pay | Admitting: *Deleted

## 2015-01-28 ENCOUNTER — Encounter (HOSPITAL_COMMUNITY)
Admission: RE | Disposition: A | Discharge status: Home / Self Care | Payer: Self-pay | Source: Ambulatory Visit | Attending: Ophthalmology

## 2015-01-28 DIAGNOSIS — H2511 Age-related nuclear cataract, right eye: Secondary | ICD-10-CM | POA: Diagnosis not present

## 2015-01-28 DIAGNOSIS — H269 Unspecified cataract: Secondary | ICD-10-CM | POA: Diagnosis not present

## 2015-01-28 DIAGNOSIS — I10 Essential (primary) hypertension: Secondary | ICD-10-CM | POA: Insufficient documentation

## 2015-01-28 DIAGNOSIS — Z79899 Other long term (current) drug therapy: Secondary | ICD-10-CM | POA: Insufficient documentation

## 2015-01-28 DIAGNOSIS — H538 Other visual disturbances: Secondary | ICD-10-CM | POA: Diagnosis not present

## 2015-01-28 DIAGNOSIS — M1991 Primary osteoarthritis, unspecified site: Secondary | ICD-10-CM | POA: Diagnosis not present

## 2015-01-28 HISTORY — PX: CATARACT EXTRACTION W/PHACO: SHX586

## 2015-01-28 SURGERY — PHACOEMULSIFICATION, CATARACT, WITH IOL INSERTION
Anesthesia: Monitor Anesthesia Care | Site: Eye | Laterality: Right

## 2015-01-28 MED ORDER — PHENYLEPHRINE-KETOROLAC 1-0.3 % IO SOLN
INTRAOCULAR | Status: DC | PRN
  Administered 2015-01-28: 500 mL via OPHTHALMIC

## 2015-01-28 MED ORDER — PHENYLEPHRINE-KETOROLAC 1-0.3 % IO SOLN
INTRAOCULAR | Status: AC
  Filled 2015-01-28: qty 4

## 2015-01-28 MED ORDER — POVIDONE-IODINE 5 % OP SOLN
OPHTHALMIC | Status: DC | PRN
  Administered 2015-01-28: 1 via OPHTHALMIC

## 2015-01-28 MED ORDER — NA HYALUR & NA CHOND-NA HYALUR 0.55-0.5 ML IO KIT
PACK | INTRAOCULAR | Status: DC | PRN
  Administered 2015-01-28: 1 via OPHTHALMIC

## 2015-01-28 MED ORDER — LACTATED RINGERS IV SOLN
INTRAVENOUS | Status: DC
  Administered 2015-01-28: 07:00:00 via INTRAVENOUS

## 2015-01-28 MED ORDER — BSS IO SOLN
INTRAOCULAR | Status: DC | PRN
  Administered 2015-01-28: 15 mL via INTRAOCULAR

## 2015-01-28 MED ORDER — TETRACAINE HCL 0.5 % OP SOLN
1.0000 [drp] | OPHTHALMIC | Status: AC
Start: 2015-01-28 — End: 2015-01-28
  Administered 2015-01-28 (×3): 1 [drp] via OPHTHALMIC

## 2015-01-28 MED ORDER — FENTANYL CITRATE (PF) 100 MCG/2ML IJ SOLN
25.0000 ug | INTRAMUSCULAR | Status: AC
  Administered 2015-01-28: 25 ug via INTRAVENOUS

## 2015-01-28 MED ORDER — TETRACAINE 0.5 % OP SOLN OPTIME - NO CHARGE
OPHTHALMIC | Status: DC | PRN
  Administered 2015-01-28: 1 [drp]

## 2015-01-28 MED ORDER — LIDOCAINE HCL 3.5 % OP GEL: 1.0000 "application " | Freq: Once | OPHTHALMIC | Status: DC

## 2015-01-28 MED ORDER — CYCLOPENTOLATE-PHENYLEPHRINE 0.2-1 % OP SOLN
1.0000 [drp] | OPHTHALMIC | Status: AC
  Administered 2015-01-28 (×3): 1 [drp] via OPHTHALMIC

## 2015-01-28 MED ORDER — ONDANSETRON HCL 4 MG/2ML IJ SOLN: 4.0000 mg | Freq: Once | INTRAMUSCULAR | Status: DC | PRN

## 2015-01-28 MED ORDER — LIDOCAINE HCL 3.5 % OP GEL
OPHTHALMIC | Status: DC | PRN
  Administered 2015-01-28: 1 via OPHTHALMIC

## 2015-01-28 MED ORDER — MIDAZOLAM HCL 2 MG/2ML IJ SOLN
1.0000 mg | INTRAMUSCULAR | Status: DC | PRN
  Administered 2015-01-28: 2 mg via INTRAVENOUS

## 2015-01-28 MED ORDER — FENTANYL CITRATE (PF) 100 MCG/2ML IJ SOLN: 25.0000 ug | INTRAMUSCULAR | Status: DC | PRN

## 2015-01-28 SURGICAL SUPPLY — 28 items
CAPSULAR TENSION RING-AMO (OPHTHALMIC RELATED) IMPLANT
CLOTH BEACON ORANGE TIMEOUT ST (SAFETY) ×1 IMPLANT
GLOVE BIO SURGEON STRL SZ7.5 (GLOVE) IMPLANT
GLOVE BIOGEL M 6.5 STRL (GLOVE) IMPLANT
GLOVE BIOGEL PI IND STRL 6.5 (GLOVE) IMPLANT
GLOVE BIOGEL PI IND STRL 7.0 (GLOVE) IMPLANT
GLOVE BIOGEL PI INDICATOR 6.5 (GLOVE) ×1
GLOVE BIOGEL PI INDICATOR 7.0 (GLOVE)
GLOVE ECLIPSE 6.5 STRL STRAW (GLOVE) IMPLANT
GLOVE ECLIPSE 7.5 STRL STRAW (GLOVE) IMPLANT
GLOVE EXAM NITRILE LRG STRL (GLOVE) IMPLANT
GLOVE EXAM NITRILE MD LF STRL (GLOVE) IMPLANT
GLOVE SKINSENSE NS SZ6.5 (GLOVE)
GLOVE SKINSENSE NS SZ7.0 (GLOVE)
GLOVE SKINSENSE STRL SZ6.5 (GLOVE) IMPLANT
GLOVE SKINSENSE STRL SZ7.0 (GLOVE) IMPLANT
INST SET CATARACT ~~LOC~~ (KITS) ×2 IMPLANT
KIT VITRECTOMY (OPHTHALMIC RELATED) IMPLANT
LENS ALC ACRYL/TECN (Ophthalmic Related) ×2 IMPLANT
PAD ARMBOARD 7.5X6 YLW CONV (MISCELLANEOUS) ×1 IMPLANT
PROC W NO LENS (INTRAOCULAR LENS)
PROC W SPEC LENS (INTRAOCULAR LENS)
PROCESS W NO LENS (INTRAOCULAR LENS) IMPLANT
PROCESS W SPEC LENS (INTRAOCULAR LENS) IMPLANT
RETRACTOR IRIS SIGHTPATH (OPHTHALMIC RELATED) IMPLANT
RING MALYGIN (MISCELLANEOUS) IMPLANT
VISCOELASTIC ADDITIONAL (OPHTHALMIC RELATED) IMPLANT
WATER STERILE IRR 250ML POUR (IV SOLUTION) ×1 IMPLANT

## 2015-01-28 NOTE — Anesthesia Postprocedure Evaluation (Signed)
Anesthesia Post Note  Patient: Christopher Booth  Procedure(s) Performed: Procedure(s) (LRB): CATARACT EXTRACTION PHACO AND INTRAOCULAR LENS PLACEMENT RIGHT EYE (Right)  Patient location during evaluation: Short Stay Anesthesia Type: MAC Level of consciousness: awake and alert and patient cooperative Pain management: pain level controlled Vital Signs Assessment: post-procedure vital signs reviewed and stable Respiratory status: nonlabored ventilation Cardiovascular status: blood pressure returned to baseline and stable Postop Assessment: adequate PO intake and no signs of nausea or vomiting Anesthetic complications: no    Last Vitals:  Filed Vitals:   01/28/15 0720 01/28/15 0725  BP: 150/107 122/79  Temp:    Resp: 12 14    Last Pain: There were no vitals filed for this visit.               Sukhdeep Wieting J

## 2015-01-28 NOTE — Transfer of Care (Signed)
Immediate Anesthesia Transfer of Care Note  Patient: Christopher Booth  Procedure(s) Performed: Procedure(s) with comments: CATARACT EXTRACTION PHACO AND INTRAOCULAR LENS PLACEMENT RIGHT EYE (Right) - CDE:27.23  Patient Location: Short Stay  Anesthesia Type:MAC  Level of Consciousness: awake, alert , oriented and patient cooperative  Airway & Oxygen Therapy: Patient Spontanous Breathing  Post-op Assessment: Report given to RN, Post -op Vital signs reviewed and stable and Patient moving all extremities  Post vital signs: Reviewed and stable  Last Vitals:  Filed Vitals:   01/28/15 0720 01/28/15 0725  BP: 150/107 122/79  Temp:    Resp: 12 14    Complications: No apparent anesthesia complications

## 2015-01-28 NOTE — H&P (Signed)
I have reviewed the pre printed H&P, the patient was re-examined, and I have identified no significant interval changes in the patient's medical condition.  There is no change in the plan of care since the history and physical of record. 

## 2015-01-28 NOTE — Brief Op Note (Signed)
01/28/2015  8:27 AM  PATIENT:  Christopher Booth  75 y.o. male  PRE-OPERATIVE DIAGNOSIS:  nuclear cataract right eye  POST-OPERATIVE DIAGNOSIS:  nuclear cataract right eye  PROCEDURE:  Procedure(s): CATARACT EXTRACTION PHACO AND INTRAOCULAR LENS PLACEMENT RIGHT EYE  SURGEON:  Surgeon(s): Williams Che, MD  ASSISTANTS: Zoila Shutter, CST   ANESTHESIA STAFF: Anesthesiologist: Lerry Liner, MD CRNA: Charmaine Downs, CRNA  ANESTHESIA:   topical and MAC  REQUESTED LENS POWER: 21.5  LENS IMPLANT INFORMATION:   Alcon SN60WF  S/n [id number]  Exp 06/2019  CUMULATIVE DISSIPATED ENERGY:27.23  INDICATIONS:see scanned office H&P for particulars  OP FINDINGS:dense NS  COMPLICATIONS:None  DICTATION #: none  PLAN OF CARE: as above  PATIENT DISPOSITION:  Short Stay

## 2015-01-28 NOTE — Op Note (Signed)
01/28/2015  8:27 AM  PATIENT:  Christopher Booth  75 y.o. male  PRE-OPERATIVE DIAGNOSIS:  nuclear cataract right eye  POST-OPERATIVE DIAGNOSIS:  nuclear cataract right eye  PROCEDURE:  Procedure(s): CATARACT EXTRACTION PHACO AND INTRAOCULAR LENS PLACEMENT RIGHT EYE  SURGEON:  Surgeon(s): Williams Che, MD  ASSISTANTS: Zoila Shutter, CST   ANESTHESIA STAFF: Anesthesiologist: Lerry Liner, MD CRNA: Charmaine Downs, CRNA  ANESTHESIA:   topical and MAC  REQUESTED LENS POWER: 21.5  LENS IMPLANT INFORMATION:   Alcon SN60WF  S/n [id number]  Exp 06/2019  CUMULATIVE DISSIPATED ENERGY:27.23  INDICATIONS:see scanned office H&P for particulars  OP FINDINGS:dense NS  COMPLICATIONS:None  PROCEDURE:  The patient was brought to the operating room in good condition.  The operative eye was prepped and draped in the usual fashion for intraocular surgery.  Lidocaine gel was dropped onto the eye.  A 2.4 mm 10 O'clock near clear corneal stepped incision and a 12 O'clock stab incision were created.  Viscoat was instilled into the anterior chamber.  The 5 mm anterior capsulorhexis was performed with a bent needle cystotome and Utrata forceps.  The lens was hydrodissected and hydrodelineated with a cannula and balanced salt solution and rotated with a Kuglen hook.  Phacoemulsification was perfomed in the divide and conquer technique.  The remaining cortex was removed with I&A and the capsular surfaces polished as necessary.  Provisc was placed into the capsular bag and the lens inserted with the Alcon inserter.  The viscoelastic was removed with I&A and the lens "rocked" into position.  The wounds were hydrated and te anterior chamber was refilled with balanced salt solution.  The wounds were checked for leakage and rehydrated as necessary.  The lid speculum and drapes were removed and the patient was transported to short stay in good condition.  PATIENT DISPOSITION:  Short Stay

## 2015-01-28 NOTE — Anesthesia Preprocedure Evaluation (Signed)
Anesthesia Evaluation  Patient identified by MRN, date of birth, ID band Patient awake    Reviewed: Allergy & Precautions, H&P , NPO status , Patient's Chart, lab work & pertinent test results  Airway Mallampati: II   Neck ROM: full    Dental   Pulmonary neg pulmonary ROS,    breath sounds clear to auscultation       Cardiovascular hypertension, Pt. on medications  Rhythm:Regular Rate:Normal     Neuro/Psych    GI/Hepatic negative GI ROS,   Endo/Other    Renal/GU      Musculoskeletal  (+) Arthritis ,   Abdominal   Peds  Hematology   Anesthesia Other Findings   Reproductive/Obstetrics                             Anesthesia Physical Anesthesia Plan  ASA: II  Anesthesia Plan: MAC   Post-op Pain Management:    Induction: Intravenous  Airway Management Planned: Nasal Cannula  Additional Equipment:   Intra-op Plan:   Post-operative Plan:   Informed Consent: I have reviewed the patients History and Physical, chart, labs and discussed the procedure including the risks, benefits and alternatives for the proposed anesthesia with the patient or authorized representative who has indicated his/her understanding and acceptance.     Plan Discussed with:   Anesthesia Plan Comments:         Anesthesia Quick Evaluation

## 2015-01-28 NOTE — Discharge Instructions (Signed)

## 2015-01-29 ENCOUNTER — Encounter (HOSPITAL_COMMUNITY): Payer: Self-pay | Admitting: Ophthalmology

## 2015-03-31 DIAGNOSIS — H2512 Age-related nuclear cataract, left eye: Secondary | ICD-10-CM | POA: Diagnosis not present

## 2015-03-31 DIAGNOSIS — H538 Other visual disturbances: Secondary | ICD-10-CM | POA: Diagnosis not present

## 2015-03-31 DIAGNOSIS — M1811 Unilateral primary osteoarthritis of first carpometacarpal joint, right hand: Secondary | ICD-10-CM | POA: Diagnosis not present

## 2015-04-08 ENCOUNTER — Encounter (HOSPITAL_COMMUNITY): Payer: Self-pay

## 2015-04-08 ENCOUNTER — Encounter (HOSPITAL_COMMUNITY)
Admission: RE | Admit: 2015-04-08 | Discharge: 2015-04-08 | Disposition: A | Discharge status: Home / Self Care | Payer: PPO | Source: Ambulatory Visit | Attending: Ophthalmology | Admitting: Ophthalmology

## 2015-04-08 NOTE — Patient Instructions (Signed)
Your procedure is scheduled on: 04/14/2015  Report to [facility] at  940   AM.  Call this number if you have problems the morning of surgery: [phone]   Do not eat food or drink liquids :After Midnight.      Take these medicines the morning of surgery with A SIP OF WATER: inspra   Do not wear jewelry, make-up or nail polish.  Do not wear lotions, powders, or perfumes. You may wear deodorant.  Do not shave 48 hours prior to surgery.  Do not bring valuables to the hospital.  Contacts, dentures or bridgework may not be worn into surgery.  Leave suitcase in the car. After surgery it may be brought to your room.  For patients admitted to the hospital, checkout time is 11:00 AM the day of discharge.   Patients discharged the day of surgery will not be allowed to drive home.  :     Please read over the following fact sheets that you were given: Coughing and Deep Breathing, Surgical Site Infection Prevention, Anesthesia Post-op Instructions and Care and Recovery After Surgery    Cataract A cataract is a clouding of the lens of the eye. When a lens becomes cloudy, vision is reduced based on the degree and nature of the clouding. Many cataracts reduce vision to some degree. Some cataracts make people more near-sighted as they develop. Other cataracts increase glare. Cataracts that are ignored and become worse can sometimes look white. The white color can be seen through the pupil. CAUSES   Aging. However, cataracts may occur at any age, even in newborns.   Certain drugs.   Trauma to the eye.   Certain diseases such as diabetes.   Specific eye diseases such as chronic inflammation inside the eye or a sudden attack of a rare form of glaucoma.   Inherited or acquired medical problems.  SYMPTOMS   Gradual, progressive drop in vision in the affected eye.   Severe, rapid visual loss. This most often happens when trauma is the cause.  DIAGNOSIS  To detect a cataract, an eye doctor examines  the lens. Cataracts are best diagnosed with an exam of the eyes with the pupils enlarged (dilated) by drops.  TREATMENT  For an early cataract, vision may improve by using different eyeglasses or stronger lighting. If that does not help your vision, surgery is the only effective treatment. A cataract needs to be surgically removed when vision loss interferes with your everyday activities, such as driving, reading, or watching TV. A cataract may also have to be removed if it prevents examination or treatment of another eye problem. Surgery removes the cloudy lens and usually replaces it with a substitute lens (intraocular lens, IOL).  At a time when both you and your doctor agree, the cataract will be surgically removed. If you have cataracts in both eyes, only one is usually removed at a time. This allows the operated eye to heal and be out of danger from any possible problems after surgery (such as infection or poor wound healing). In rare cases, a cataract may be doing damage to your eye. In these cases, your caregiver may advise surgical removal right away. The vast majority of people who have cataract surgery have better vision afterward. HOME CARE INSTRUCTIONS  If you are not planning surgery, you may be asked to do the following:  Use different eyeglasses.   Use stronger or brighter lighting.   Ask your eye doctor about reducing your medicine dose  or changing medicines if it is thought that a medicine caused your cataract. Changing medicines does not make the cataract go away on its own.   Become familiar with your surroundings. Poor vision can lead to injury. Avoid bumping into things on the affected side. You are at a higher risk for tripping or falling.   Exercise extreme care when driving or operating machinery.   Wear sunglasses if you are sensitive to bright light or experiencing problems with glare.  SEEK IMMEDIATE MEDICAL CARE IF:   You have a worsening or sudden vision loss.    You notice redness, swelling, or increasing pain in the eye.   You have a fever.  Document Released: 01/11/2005 Document Revised: 12/31/2010 Document Reviewed: 09/04/2010 [facility] Patient Information 2012 [place].PATIENT INSTRUCTIONS POST-ANESTHESIA  IMMEDIATELY FOLLOWING SURGERY:  Do not drive or operate machinery for the first twenty four hours after surgery.  Do not make any important decisions for twenty four hours after surgery or while taking narcotic pain medications or sedatives.  If you develop intractable nausea and vomiting or a severe headache please notify your doctor immediately.  FOLLOW-UP:  Please make an appointment with your surgeon as instructed. You do not need to follow up with anesthesia unless specifically instructed to do so.  WOUND CARE INSTRUCTIONS (if applicable):  Keep a dry clean dressing on the anesthesia/puncture wound site if there is drainage.  Once the wound has quit draining you may leave it open to air.  Generally you should leave the bandage intact for twenty four hours unless there is drainage.  If the epidural site drains for more than 36-48 hours please call the anesthesia department.  QUESTIONS?:  Please feel free to call your physician or the hospital operator if you have any questions, and they will be happy to assist you.

## 2015-04-14 ENCOUNTER — Ambulatory Visit (HOSPITAL_COMMUNITY): Payer: PPO | Admitting: Anesthesiology

## 2015-04-14 ENCOUNTER — Encounter (HOSPITAL_COMMUNITY)
Admission: RE | Disposition: A | Discharge status: Home / Self Care | Payer: Self-pay | Source: Ambulatory Visit | Attending: Ophthalmology

## 2015-04-14 ENCOUNTER — Ambulatory Visit (HOSPITAL_COMMUNITY)
Admission: RE | Admit: 2015-04-14 | Discharge: 2015-04-14 | Disposition: A | Discharge status: Home / Self Care | Payer: PPO | Source: Ambulatory Visit | Attending: Ophthalmology | Admitting: Ophthalmology

## 2015-04-14 DIAGNOSIS — H2512 Age-related nuclear cataract, left eye: Secondary | ICD-10-CM | POA: Diagnosis not present

## 2015-04-14 DIAGNOSIS — I10 Essential (primary) hypertension: Secondary | ICD-10-CM | POA: Diagnosis not present

## 2015-04-14 DIAGNOSIS — M1991 Primary osteoarthritis, unspecified site: Secondary | ICD-10-CM | POA: Insufficient documentation

## 2015-04-14 DIAGNOSIS — H269 Unspecified cataract: Secondary | ICD-10-CM | POA: Insufficient documentation

## 2015-04-14 DIAGNOSIS — Z79899 Other long term (current) drug therapy: Secondary | ICD-10-CM | POA: Insufficient documentation

## 2015-04-14 DIAGNOSIS — H538 Other visual disturbances: Secondary | ICD-10-CM | POA: Diagnosis not present

## 2015-04-14 HISTORY — PX: CATARACT EXTRACTION W/PHACO: SHX586

## 2015-04-14 SURGERY — PHACOEMULSIFICATION, CATARACT, WITH IOL INSERTION
Anesthesia: Monitor Anesthesia Care | Laterality: Left

## 2015-04-14 MED ORDER — MIDAZOLAM HCL 2 MG/2ML IJ SOLN
1.0000 mg | INTRAMUSCULAR | Status: DC | PRN
  Administered 2015-04-14 (×2): 1 mg via INTRAVENOUS

## 2015-04-14 MED ORDER — CYCLOPENTOLATE-PHENYLEPHRINE 0.2-1 % OP SOLN
1.0000 [drp] | OPHTHALMIC | Status: AC
  Administered 2015-04-14 (×2): 1 [drp] via OPHTHALMIC

## 2015-04-14 MED ORDER — LIDOCAINE HCL 3.5 % OP GEL
OPHTHALMIC | Status: AC
  Filled 2015-04-14: qty 1

## 2015-04-14 MED ORDER — LIDOCAINE HCL 3.5 % OP GEL: 1.0000 "application " | Freq: Once | OPHTHALMIC | Status: DC

## 2015-04-14 MED ORDER — PHENYLEPHRINE-KETOROLAC 1-0.3 % IO SOLN
INTRAOCULAR | Status: DC | PRN
  Administered 2015-04-14: 500 mL via OPHTHALMIC

## 2015-04-14 MED ORDER — TETRACAINE HCL 0.5 % OP SOLN
OPHTHALMIC | Status: AC
  Filled 2015-04-14: qty 4

## 2015-04-14 MED ORDER — FENTANYL CITRATE (PF) 100 MCG/2ML IJ SOLN
25.0000 ug | INTRAMUSCULAR | Status: AC
  Administered 2015-04-14 (×2): 25 ug via INTRAVENOUS

## 2015-04-14 MED ORDER — TETRACAINE HCL 0.5 % OP SOLN
1.0000 [drp] | OPHTHALMIC | Status: AC
  Administered 2015-04-14 (×2): 1 [drp] via OPHTHALMIC

## 2015-04-14 MED ORDER — POVIDONE-IODINE 5 % OP SOLN
OPHTHALMIC | Status: DC | PRN
  Administered 2015-04-14: 1 via OPHTHALMIC

## 2015-04-14 MED ORDER — TETRACAINE 0.5 % OP SOLN OPTIME - NO CHARGE
OPHTHALMIC | Status: DC | PRN
  Administered 2015-04-14: 2 [drp] via OPHTHALMIC

## 2015-04-14 MED ORDER — NA HYALUR & NA CHOND-NA HYALUR 0.55-0.5 ML IO KIT
PACK | INTRAOCULAR | Status: DC | PRN
  Administered 2015-04-14: 1 via OPHTHALMIC

## 2015-04-14 MED ORDER — LIDOCAINE HCL 3.5 % OP GEL
OPHTHALMIC | Status: DC | PRN
  Administered 2015-04-14: 1 via OPHTHALMIC

## 2015-04-14 MED ORDER — CYCLOPENTOLATE-PHENYLEPHRINE OP SOLN OPTIME - NO CHARGE
OPHTHALMIC | Status: AC
  Filled 2015-04-14: qty 2

## 2015-04-14 MED ORDER — MIDAZOLAM HCL 2 MG/2ML IJ SOLN
INTRAMUSCULAR | Status: AC
  Filled 2015-04-14: qty 2

## 2015-04-14 MED ORDER — PHENYLEPHRINE-KETOROLAC 1-0.3 % IO SOLN
INTRAOCULAR | Status: AC
  Filled 2015-04-14: qty 4

## 2015-04-14 MED ORDER — LACTATED RINGERS IV SOLN
INTRAVENOUS | Status: DC
  Administered 2015-04-14: 11:00:00 via INTRAVENOUS

## 2015-04-14 MED ORDER — BSS IO SOLN
INTRAOCULAR | Status: DC | PRN
  Administered 2015-04-14: 15 mL

## 2015-04-14 MED ORDER — FENTANYL CITRATE (PF) 100 MCG/2ML IJ SOLN
INTRAMUSCULAR | Status: AC
  Filled 2015-04-14: qty 2

## 2015-04-14 SURGICAL SUPPLY — 9 items
CLOTH BEACON ORANGE TIMEOUT ST (SAFETY) ×1 IMPLANT
GLOVE BIOGEL PI IND STRL 6.5 (GLOVE) IMPLANT
GLOVE BIOGEL PI IND STRL 7.0 (GLOVE) IMPLANT
GLOVE BIOGEL PI INDICATOR 6.5 (GLOVE) ×1
GLOVE BIOGEL PI INDICATOR 7.0 (GLOVE) ×1
INST SET CATARACT ~~LOC~~ (KITS) ×2 IMPLANT
LENS ALC ACRYL/TECN (Ophthalmic Related) ×2 IMPLANT
PAD ARMBOARD 7.5X6 YLW CONV (MISCELLANEOUS) ×2 IMPLANT
WATER STERILE IRR 250ML POUR (IV SOLUTION) ×1 IMPLANT

## 2015-04-14 NOTE — Brief Op Note (Signed)
04/14/2015  11:53 AM  PATIENT:  Katha Cabal  75 y.o. male  PRE-OPERATIVE DIAGNOSIS:  cataract left eye, difficulty performing daily activities  POST-OPERATIVE DIAGNOSIS:  cataract left eye, difficulty performing daily activities  PROCEDURE:  Procedure(s): CATARACT EXTRACTION PHACO AND INTRAOCULAR LENS PLACEMENT LEFT EYE CDE=15.86  SURGEON:  Surgeon(s): Williams Che, MD  ASSISTANTS: Zoila Shutter, CST   ANESTHESIA STAFF: Anesthesiologist: Lerry Liner, MD CRNA: Vista Deck, CRNA  ANESTHESIA:   topical and MAC  REQUESTED LENS POWER: 22.5  LENS IMPLANT INFORMATION:   Alcon SN60WF  +22.5  S/n [id number]   Exp 07/2019  CUMULATIVE DISSIPATED ENERGY:15.86  INDICATIONS:see scanned office H&P for particulars  OP FINDINGS:dense NS  COMPLICATIONS:None  DICTATION #: none  PLAN OF CARE: as above  PATIENT DISPOSITION:  Short Stay

## 2015-04-14 NOTE — Anesthesia Postprocedure Evaluation (Signed)
  Anesthesia Post-op Note  Patient: Christopher Booth  Procedure(s) Performed: Procedure(s) (LRB): CATARACT EXTRACTION PHACO AND INTRAOCULAR LENS PLACEMENT LEFT EYE CDE=15.86 (Left)  Patient Location:  Short Stay  Anesthesia Type: MAC  Level of Consciousness: awake  Airway and Oxygen Therapy: Patient Spontanous Breathing  Post-op Pain: none  Post-op Assessment: Post-op Vital signs reviewed, Patient's Cardiovascular Status Stable, Respiratory Function Stable, Patent Airway, No signs of Nausea or vomiting and Pain level controlled  Post-op Vital Signs: Reviewed and stable  Complications: No apparent anesthesia complications

## 2015-04-14 NOTE — Discharge Instructions (Signed)
Cataract Surgery, Care After °Refer to this sheet in the next few weeks. These instructions provide you with information on caring for yourself after your procedure. Your caregiver may also give you more specific instructions. Your treatment has been planned according to current medical practices, but problems sometimes occur. Call your caregiver if you have any problems or questions after your procedure.  °HOME CARE INSTRUCTIONS  °· Avoid strenuous activities as directed by your caregiver. °· Ask your caregiver when you can resume driving. °· Use eyedrops or other medicines to help healing and control pressure inside your eye as directed by your caregiver. °· Only take over-the-counter or prescription medicines for pain, discomfort, or fever as directed by your caregiver. °· Do not to touch or rub your eyes. °· You may be instructed to use a protective shield during the first few days and nights after surgery. If not, wear sunglasses to protect your eyes. This is to protect the eye from pressure or from being accidentally bumped. °· Keep the area around your eye clean and dry. Avoid swimming or allowing water to hit you directly in the face while showering. Keep soap and shampoo out of your eyes. °· Do not bend or lift heavy objects. Bending increases pressure in the eye. You can walk, climb stairs, and do light household chores. °· Do not put a contact lens into the eye that had surgery until your caregiver says it is okay to do so. °· Ask your doctor when you can return to work. This will depend on the kind of work that you do. If you work in a dusty environment, you may be advised to wear protective eyewear for a period of time. °· Ask your caregiver when it will be safe to engage in sexual activity. °· Continue with your regular eye exams as directed by your caregiver. °What to expect: °· It is normal to feel itching and mild discomfort for a few days after cataract surgery. Some fluid discharge is also common,  and your eye may be sensitive to light and touch. °· After 1 to 2 days, even moderate discomfort should disappear. In most cases, healing will take about 6 weeks. °· If you received an intraocular lens (IOL), you may notice that colors are very bright or have a blue tinge. Also, if you have been in bright sunlight, everything may appear reddish for a few hours. If you see these color tinges, it is because your lens is clear and no longer cloudy. Within a few months after receiving an IOL, these extra colors should go away. When you have healed, you will probably need new glasses. °SEEK MEDICAL CARE IF:  °· You have increased bruising around your eye. °· You have discomfort not helped by medicine. °SEEK IMMEDIATE MEDICAL CARE IF:  °· You have a  fever. °· You have a worsening or sudden vision loss. °· You have redness, swelling, or increasing pain in the eye. °· You have a thick discharge from the eye that had surgery. °MAKE SURE YOU: °· Understand these instructions. °· Will watch your condition. °· Will get help right away if you are not doing well or get worse. °  °This information is not intended to replace advice given to you by your health care provider. Make sure you discuss any questions you have with your health care provider. °  °Document Released: 07/31/2004 Document Revised: 02/01/2014 Document Reviewed: 09/04/2010 °Elsevier Interactive Patient Education ©2016 [employer] ° °

## 2015-04-14 NOTE — H&P (Signed)
I have reviewed the pre printed H&P, the patient was re-examined, and I have identified no significant interval changes in the patient's medical condition.  There is no change in the plan of care since the history and physical of record. 

## 2015-04-14 NOTE — Anesthesia Preprocedure Evaluation (Signed)
Anesthesia Evaluation  Patient identified by MRN, date of birth, ID band Patient awake    Reviewed: Allergy & Precautions, H&P , NPO status , Patient's Chart, lab work & pertinent test results  Airway Mallampati: II   Neck ROM: full    Dental  (+) Teeth Intact   Pulmonary neg pulmonary ROS,    breath sounds clear to auscultation       Cardiovascular hypertension, Pt. on medications  Rhythm:Regular Rate:Normal     Neuro/Psych    GI/Hepatic negative GI ROS,   Endo/Other    Renal/GU      Musculoskeletal  (+) Arthritis ,   Abdominal   Peds  Hematology   Anesthesia Other Findings   Reproductive/Obstetrics                             Anesthesia Physical Anesthesia Plan  ASA: II  Anesthesia Plan: MAC   Post-op Pain Management:    Induction: Intravenous  Airway Management Planned: Nasal Cannula  Additional Equipment:   Intra-op Plan:   Post-operative Plan:   Informed Consent: I have reviewed the patients History and Physical, chart, labs and discussed the procedure including the risks, benefits and alternatives for the proposed anesthesia with the patient or authorized representative who has indicated his/her understanding and acceptance.     Plan Discussed with:   Anesthesia Plan Comments:         Anesthesia Quick Evaluation

## 2015-04-14 NOTE — Op Note (Signed)
04/14/2015  11:53 AM  PATIENT:  Christopher Booth  75 y.o. male  PRE-OPERATIVE DIAGNOSIS:  cataract left eye, difficulty performing daily activities  POST-OPERATIVE DIAGNOSIS:  cataract left eye, difficulty performing daily activities  PROCEDURE:  Procedure(s): CATARACT EXTRACTION PHACO AND INTRAOCULAR LENS PLACEMENT LEFT EYE CDE=15.86  SURGEON:  Surgeon(s): Williams Che, MD  ASSISTANTS: Zoila Shutter, CST   ANESTHESIA STAFF: Anesthesiologist: Lerry Liner, MD CRNA: Vista Deck, CRNA  ANESTHESIA:   topical and MAC  REQUESTED LENS POWER: 22.5  LENS IMPLANT INFORMATION:   Alcon [id number]  +22.5  S/n [id number]   Exp 07/2019  CUMULATIVE DISSIPATED ENERGY:15.86  INDICATIONS:see scanned office H&P for particulars  OP FINDINGS:dense NS  COMPLICATIONS:None  PROCEDURE:  The patient was brought to the operating room in good condition.  The operative eye was prepped and draped in the usual fashion for intraocular surgery.  Lidocaine gel was dropped onto the eye.  A 2.4 mm 10 O'clock near clear corneal stepped incision and a 12 O'clock stab incision were created.  Viscoat was instilled into the anterior chamber.  The 5 mm anterior capsulorhexis was performed with a bent needle cystotome and Utrata forceps.  The lens was hydrodissected and hydrodelineated with a cannula and balanced salt solution and rotated with a Kuglen hook.  Phacoemulsification was perfomed in the divide and conquer technique.  The remaining cortex was removed with I&A and the capsular surfaces polished as necessary.  Provisc was placed into the capsular bag and the lens inserted with the Alcon inserter.  The viscoelastic was removed with I&A and the lens "rocked" into position.  The wounds were hydrated and te anterior chamber was refilled with balanced salt solution.  The wounds were checked for leakage and rehydrated as necessary.  The lid speculum and drapes were removed and the patient was transported to short stay  in good condition.  PATIENT DISPOSITION:  Short Stay

## 2015-04-14 NOTE — Anesthesia Procedure Notes (Signed)
Procedure Name: MAC Date/Time: 04/14/2015 11:21 AM Performed by: Vista Deck Pre-anesthesia Checklist: Patient identified, Emergency Drugs available, Suction available, Timeout performed and Patient being monitored Patient Re-evaluated:Patient Re-evaluated prior to inductionOxygen Delivery Method: Nasal Cannula

## 2015-04-14 NOTE — Transfer of Care (Signed)
Immediate Anesthesia Transfer of Care Note  Patient: Christopher Booth  Procedure(s) Performed: Procedure(s) (LRB): CATARACT EXTRACTION PHACO AND INTRAOCULAR LENS PLACEMENT LEFT EYE CDE=15.86 (Left)  Patient Location: Shortstay  Anesthesia Type: MAC  Level of Consciousness: awake  Airway & Oxygen Therapy: Patient Spontanous Breathing   Post-op Assessment: Report given to PACU RN, Post -op Vital signs reviewed and stable and Patient moving all extremities  Post vital signs: Reviewed and stable  Complications: No apparent anesthesia complications

## 2015-04-15 ENCOUNTER — Encounter (HOSPITAL_COMMUNITY): Payer: Self-pay | Admitting: Ophthalmology

## 2015-05-19 DIAGNOSIS — E2609 Other primary hyperaldosteronism: Secondary | ICD-10-CM | POA: Diagnosis not present

## 2015-05-19 DIAGNOSIS — N62 Hypertrophy of breast: Secondary | ICD-10-CM | POA: Diagnosis not present

## 2015-05-19 DIAGNOSIS — N182 Chronic kidney disease, stage 2 (mild): Secondary | ICD-10-CM | POA: Diagnosis not present

## 2015-05-19 DIAGNOSIS — Z0000 Encounter for general adult medical examination without abnormal findings: Secondary | ICD-10-CM | POA: Diagnosis not present

## 2015-05-19 DIAGNOSIS — M1711 Unilateral primary osteoarthritis, right knee: Secondary | ICD-10-CM | POA: Diagnosis not present

## 2015-05-19 DIAGNOSIS — I152 Hypertension secondary to endocrine disorders: Secondary | ICD-10-CM | POA: Diagnosis not present

## 2015-05-19 DIAGNOSIS — E782 Mixed hyperlipidemia: Secondary | ICD-10-CM | POA: Diagnosis not present

## 2015-10-14 DIAGNOSIS — Z0000 Encounter for general adult medical examination without abnormal findings: Secondary | ICD-10-CM | POA: Diagnosis not present

## 2015-10-20 DIAGNOSIS — M26629 Arthralgia of temporomandibular joint, unspecified side: Secondary | ICD-10-CM | POA: Diagnosis not present

## 2015-10-20 DIAGNOSIS — E2609 Other primary hyperaldosteronism: Secondary | ICD-10-CM | POA: Diagnosis not present

## 2015-10-20 DIAGNOSIS — I10 Essential (primary) hypertension: Secondary | ICD-10-CM | POA: Diagnosis not present

## 2015-10-20 DIAGNOSIS — Z0000 Encounter for general adult medical examination without abnormal findings: Secondary | ICD-10-CM | POA: Diagnosis not present

## 2015-10-20 DIAGNOSIS — N62 Hypertrophy of breast: Secondary | ICD-10-CM | POA: Diagnosis not present

## 2015-10-20 DIAGNOSIS — M4800 Spinal stenosis, site unspecified: Secondary | ICD-10-CM | POA: Diagnosis not present

## 2015-10-20 DIAGNOSIS — Z125 Encounter for screening for malignant neoplasm of prostate: Secondary | ICD-10-CM | POA: Diagnosis not present

## 2015-10-20 DIAGNOSIS — R739 Hyperglycemia, unspecified: Secondary | ICD-10-CM | POA: Diagnosis not present

## 2015-11-10 DIAGNOSIS — D485 Neoplasm of uncertain behavior of skin: Secondary | ICD-10-CM | POA: Diagnosis not present

## 2015-11-10 DIAGNOSIS — D2261 Melanocytic nevi of right upper limb, including shoulder: Secondary | ICD-10-CM | POA: Diagnosis not present

## 2015-11-10 DIAGNOSIS — L570 Actinic keratosis: Secondary | ICD-10-CM | POA: Diagnosis not present

## 2015-11-10 DIAGNOSIS — D1800 Hemangioma unspecified site: Secondary | ICD-10-CM | POA: Diagnosis not present

## 2015-11-10 DIAGNOSIS — D225 Melanocytic nevi of trunk: Secondary | ICD-10-CM | POA: Diagnosis not present

## 2015-11-25 DIAGNOSIS — I152 Hypertension secondary to endocrine disorders: Secondary | ICD-10-CM | POA: Diagnosis not present

## 2015-11-25 DIAGNOSIS — M1711 Unilateral primary osteoarthritis, right knee: Secondary | ICD-10-CM | POA: Diagnosis not present

## 2015-11-25 DIAGNOSIS — E2609 Other primary hyperaldosteronism: Secondary | ICD-10-CM | POA: Diagnosis not present

## 2015-11-25 DIAGNOSIS — N182 Chronic kidney disease, stage 2 (mild): Secondary | ICD-10-CM | POA: Diagnosis not present

## 2015-11-25 DIAGNOSIS — E782 Mixed hyperlipidemia: Secondary | ICD-10-CM | POA: Diagnosis not present

## 2015-11-27 DIAGNOSIS — D485 Neoplasm of uncertain behavior of skin: Secondary | ICD-10-CM | POA: Diagnosis not present

## 2015-11-27 DIAGNOSIS — L988 Other specified disorders of the skin and subcutaneous tissue: Secondary | ICD-10-CM | POA: Diagnosis not present

## 2016-05-21 DIAGNOSIS — E2609 Other primary hyperaldosteronism: Secondary | ICD-10-CM | POA: Diagnosis not present

## 2016-05-21 DIAGNOSIS — I152 Hypertension secondary to endocrine disorders: Secondary | ICD-10-CM | POA: Diagnosis not present

## 2016-05-21 DIAGNOSIS — E782 Mixed hyperlipidemia: Secondary | ICD-10-CM | POA: Diagnosis not present

## 2016-05-21 DIAGNOSIS — N62 Hypertrophy of breast: Secondary | ICD-10-CM | POA: Diagnosis not present

## 2016-05-21 DIAGNOSIS — N182 Chronic kidney disease, stage 2 (mild): Secondary | ICD-10-CM | POA: Diagnosis not present

## 2016-06-03 DIAGNOSIS — N182 Chronic kidney disease, stage 2 (mild): Secondary | ICD-10-CM | POA: Diagnosis not present

## 2016-11-11 DIAGNOSIS — Z0000 Encounter for general adult medical examination without abnormal findings: Secondary | ICD-10-CM | POA: Diagnosis not present

## 2016-11-16 DIAGNOSIS — I10 Essential (primary) hypertension: Secondary | ICD-10-CM | POA: Diagnosis not present

## 2016-11-16 DIAGNOSIS — M4800 Spinal stenosis, site unspecified: Secondary | ICD-10-CM | POA: Diagnosis not present

## 2016-11-16 DIAGNOSIS — R739 Hyperglycemia, unspecified: Secondary | ICD-10-CM | POA: Diagnosis not present

## 2016-11-16 DIAGNOSIS — N62 Hypertrophy of breast: Secondary | ICD-10-CM | POA: Diagnosis not present

## 2016-11-19 LAB — CBC WITH DIFF/PLATELET
BASO(ABSOLUTE): 77
Basophils: 0.9
Eosinophils Absolute: 519
Eosinophils, %: 6.1
HCT: 43 — AB (ref 29–41)
Hemoglobin: 14.8
Lymphocytes: 23.7
Lymphs Abs: 2015
MCH: 30.3
MCHC: 34.8
MCV: 86.9 (ref 76–111)
MPV: 9.6 fL (ref 7.5–11.5)
Monocytes(Absolute): 714
Monocytes: 8.4
Neutro Abs: 5177
Neutrophils: 60.9
RBC: 4.89 (ref 3.87–5.11)
RDW: 12.5
WBC: 8.5
platelet count: 301

## 2016-11-19 LAB — CMP 10231
ALBUMIN/GLOBULIN RATIO: 1.5
ALT: 39 — AB (ref 3–30)
AST: 22
Albumin: 4.3
Alkaline Phosphatase: 88
BUN: 17 (ref 4–21)
Calcium: 9.5
Carbon Dioxide, Total: 35
Chloride: 99
Creat: 0.95
EGFR (African American): 90
EGFR (Non-African Amer.): 78
Globulin: 2.8
Glucose: 123
Potassium: 3.4
Sodium: 140
Total Bilirubin: 0.8
Total Protein: 7.1 (ref 6.4–8.2)

## 2016-11-19 LAB — PSA: PSA, Total: 0.5

## 2016-11-19 LAB — LAB REPORT - SCANNED
Chol/HDL Ratio, serum: 3.3
Cholesterol, Total: 153
HDL Cholesterol: 47 (ref 35–70)
LDL Cholesterol: 83
Non HDL Cholesterol: 106
Triglycerides: 126 (ref 40–160)

## 2016-11-19 LAB — HEMOGLOBIN A1C: Hgb A1c MFr Bld: 6.3 — AB (ref 4.0–6.0)

## 2017-01-11 ENCOUNTER — Encounter (HOSPITAL_COMMUNITY): Payer: Self-pay

## 2017-01-11 ENCOUNTER — Encounter (HOSPITAL_COMMUNITY)
Admission: EM | Disposition: A | Discharge status: Home / Self Care | Payer: Self-pay | Source: Home / Self Care | Attending: Cardiothoracic Surgery

## 2017-01-11 ENCOUNTER — Emergency Department (HOSPITAL_COMMUNITY): Payer: PPO

## 2017-01-11 ENCOUNTER — Inpatient Hospital Stay (HOSPITAL_COMMUNITY): Payer: PPO

## 2017-01-11 ENCOUNTER — Inpatient Hospital Stay (HOSPITAL_COMMUNITY)
Admission: EM | Admit: 2017-01-11 | Discharge: 2017-01-17 | DRG: 234 | Disposition: A | Discharge status: Home / Self Care | Payer: PPO | Attending: Cardiothoracic Surgery | Admitting: Cardiothoracic Surgery

## 2017-01-11 DIAGNOSIS — E782 Mixed hyperlipidemia: Secondary | ICD-10-CM | POA: Diagnosis present

## 2017-01-11 DIAGNOSIS — I2510 Atherosclerotic heart disease of native coronary artery without angina pectoris: Secondary | ICD-10-CM | POA: Diagnosis not present

## 2017-01-11 DIAGNOSIS — Z7901 Long term (current) use of anticoagulants: Secondary | ICD-10-CM | POA: Diagnosis not present

## 2017-01-11 DIAGNOSIS — R079 Chest pain, unspecified: Secondary | ICD-10-CM | POA: Diagnosis not present

## 2017-01-11 DIAGNOSIS — N62 Hypertrophy of breast: Secondary | ICD-10-CM | POA: Diagnosis not present

## 2017-01-11 DIAGNOSIS — Z4682 Encounter for fitting and adjustment of non-vascular catheter: Secondary | ICD-10-CM | POA: Diagnosis not present

## 2017-01-11 DIAGNOSIS — Z9841 Cataract extraction status, right eye: Secondary | ICD-10-CM | POA: Diagnosis not present

## 2017-01-11 DIAGNOSIS — Z9049 Acquired absence of other specified parts of digestive tract: Secondary | ICD-10-CM

## 2017-01-11 DIAGNOSIS — Z808 Family history of malignant neoplasm of other organs or systems: Secondary | ICD-10-CM

## 2017-01-11 DIAGNOSIS — I083 Combined rheumatic disorders of mitral, aortic and tricuspid valves: Secondary | ICD-10-CM | POA: Diagnosis present

## 2017-01-11 DIAGNOSIS — I4891 Unspecified atrial fibrillation: Secondary | ICD-10-CM | POA: Diagnosis not present

## 2017-01-11 DIAGNOSIS — Z9842 Cataract extraction status, left eye: Secondary | ICD-10-CM

## 2017-01-11 DIAGNOSIS — E876 Hypokalemia: Secondary | ICD-10-CM | POA: Diagnosis not present

## 2017-01-11 DIAGNOSIS — Z981 Arthrodesis status: Secondary | ICD-10-CM | POA: Diagnosis not present

## 2017-01-11 DIAGNOSIS — I5030 Unspecified diastolic (congestive) heart failure: Secondary | ICD-10-CM

## 2017-01-11 DIAGNOSIS — I493 Ventricular premature depolarization: Secondary | ICD-10-CM | POA: Diagnosis not present

## 2017-01-11 DIAGNOSIS — I25110 Atherosclerotic heart disease of native coronary artery with unstable angina pectoris: Secondary | ICD-10-CM | POA: Diagnosis not present

## 2017-01-11 DIAGNOSIS — E2740 Unspecified adrenocortical insufficiency: Secondary | ICD-10-CM | POA: Diagnosis present

## 2017-01-11 DIAGNOSIS — R918 Other nonspecific abnormal finding of lung field: Secondary | ICD-10-CM | POA: Diagnosis not present

## 2017-01-11 DIAGNOSIS — M26609 Unspecified temporomandibular joint disorder, unspecified side: Secondary | ICD-10-CM | POA: Diagnosis present

## 2017-01-11 DIAGNOSIS — I214 Non-ST elevation (NSTEMI) myocardial infarction: Secondary | ICD-10-CM | POA: Diagnosis not present

## 2017-01-11 DIAGNOSIS — I2584 Coronary atherosclerosis due to calcified coronary lesion: Secondary | ICD-10-CM | POA: Diagnosis present

## 2017-01-11 DIAGNOSIS — Z888 Allergy status to other drugs, medicaments and biological substances status: Secondary | ICD-10-CM | POA: Diagnosis not present

## 2017-01-11 DIAGNOSIS — E119 Type 2 diabetes mellitus without complications: Secondary | ICD-10-CM | POA: Diagnosis present

## 2017-01-11 DIAGNOSIS — I119 Hypertensive heart disease without heart failure: Secondary | ICD-10-CM | POA: Diagnosis not present

## 2017-01-11 DIAGNOSIS — M1990 Unspecified osteoarthritis, unspecified site: Secondary | ICD-10-CM | POA: Diagnosis not present

## 2017-01-11 DIAGNOSIS — Z09 Encounter for follow-up examination after completed treatment for conditions other than malignant neoplasm: Secondary | ICD-10-CM

## 2017-01-11 DIAGNOSIS — Z01810 Encounter for preprocedural cardiovascular examination: Secondary | ICD-10-CM | POA: Diagnosis not present

## 2017-01-11 DIAGNOSIS — I10 Essential (primary) hypertension: Secondary | ICD-10-CM

## 2017-01-11 DIAGNOSIS — E8770 Fluid overload, unspecified: Secondary | ICD-10-CM | POA: Diagnosis not present

## 2017-01-11 DIAGNOSIS — D62 Acute posthemorrhagic anemia: Secondary | ICD-10-CM | POA: Diagnosis not present

## 2017-01-11 DIAGNOSIS — Z79899 Other long term (current) drug therapy: Secondary | ICD-10-CM

## 2017-01-11 DIAGNOSIS — I080 Rheumatic disorders of both mitral and aortic valves: Secondary | ICD-10-CM | POA: Diagnosis not present

## 2017-01-11 DIAGNOSIS — Z951 Presence of aortocoronary bypass graft: Secondary | ICD-10-CM

## 2017-01-11 DIAGNOSIS — Z961 Presence of intraocular lens: Secondary | ICD-10-CM | POA: Diagnosis present

## 2017-01-11 DIAGNOSIS — I25810 Atherosclerosis of coronary artery bypass graft(s) without angina pectoris: Secondary | ICD-10-CM | POA: Diagnosis not present

## 2017-01-11 HISTORY — PX: LEFT HEART CATH AND CORONARY ANGIOGRAPHY: CATH118249

## 2017-01-11 LAB — ECHOCARDIOGRAM COMPLETE
Ao-asc: 33 cm
CHL CUP MV DEC (S): 257
E decel time: 257 msec
E/e' ratio: 11.5
FS: 38 % (ref 28–44)
Height: 67 in
IV/PV OW: 2.08
LA vol A4C: 20.8 ml
LA vol index: 14.1 mL/m2
LA vol: 26.7 mL
LADIAMINDEX: 1.37 cm/m2
LASIZE: 26 mm
LEFT ATRIUM END SYS DIAM: 26 mm
LV E/e' medial: 11.5
LV PW d: 8.76 mm — AB (ref 0.6–1.1)
LV SIMPSON'S DISK: 60
LV TDI E'LATERAL: 5.66
LV TDI E'MEDIAL: 4.68
LV dias vol: 57 mL — AB (ref 62–150)
LV sys vol index: 12 mL/m2
LVDIAVOLIN: 30 mL/m2
LVEEAVG: 11.5
LVELAT: 5.66 cm/s
LVOT VTI: 15.6 cm
LVOT area: 2.84 cm2
LVOT peak vel: 79.1 cm/s
LVOTD: 19 mm
LVOTSV: 44 mL
LVSYSVOL: 23 mL (ref 21–61)
MVPKAVEL: 113 m/s
MVPKEVEL: 65.1 m/s
P 1/2 time: 589 ms
RV LATERAL S' VELOCITY: 12.7 cm/s
Stroke v: 34 ml
TAPSE: 26.1 mm
Weight: 2768 oz

## 2017-01-11 LAB — PROTIME-INR
INR: 0.94
PROTHROMBIN TIME: 12.5 s (ref 11.4–15.2)

## 2017-01-11 LAB — URINALYSIS, ROUTINE W REFLEX MICROSCOPIC
Bilirubin Urine: NEGATIVE
Glucose, UA: NEGATIVE mg/dL
Hgb urine dipstick: NEGATIVE
Ketones, ur: 20 mg/dL — AB
Leukocytes, UA: NEGATIVE
Nitrite: NEGATIVE
Protein, ur: NEGATIVE mg/dL
Specific Gravity, Urine: 1.026 (ref 1.005–1.030)
pH: 7 (ref 5.0–8.0)

## 2017-01-11 LAB — TROPONIN I
Troponin I: 0.2 ng/mL (ref <0.03)
Troponin I: 9.85 ng/mL (ref <0.03)

## 2017-01-11 LAB — CBC
HEMATOCRIT: 42.3 % (ref 39.0–52.0)
HEMOGLOBIN: 14.8 g/dL (ref 13.0–17.0)
MCH: 30.6 pg (ref 26.0–34.0)
MCHC: 35 g/dL (ref 30.0–36.0)
MCV: 87.4 fL (ref 78.0–100.0)
Platelets: 248 10*3/uL (ref 150–400)
RBC: 4.84 MIL/uL (ref 4.22–5.81)
RDW: 12.6 % (ref 11.5–15.5)
WBC: 11.4 10*3/uL — AB (ref 4.0–10.5)

## 2017-01-11 LAB — CBC WITH DIFFERENTIAL/PLATELET
BASOS PCT: 0 %
Basophils Absolute: 0 10*3/uL (ref 0.0–0.1)
Eosinophils Absolute: 0.7 10*3/uL (ref 0.0–0.7)
Eosinophils Relative: 6 %
HEMATOCRIT: 46.2 % (ref 39.0–52.0)
Hemoglobin: 16 g/dL (ref 13.0–17.0)
Lymphocytes Relative: 33 %
Lymphs Abs: 3.6 10*3/uL (ref 0.7–4.0)
MCH: 30.4 pg (ref 26.0–34.0)
MCHC: 34.6 g/dL (ref 30.0–36.0)
MCV: 87.8 fL (ref 78.0–100.0)
MONO ABS: 0.9 10*3/uL (ref 0.1–1.0)
MONOS PCT: 8 %
NEUTROS ABS: 5.6 10*3/uL (ref 1.7–7.7)
Neutrophils Relative %: 53 %
Platelets: 293 10*3/uL (ref 150–400)
RBC: 5.26 MIL/uL (ref 4.22–5.81)
RDW: 12.3 % (ref 11.5–15.5)
WBC: 10.8 10*3/uL — ABNORMAL HIGH (ref 4.0–10.5)

## 2017-01-11 LAB — BASIC METABOLIC PANEL
Anion gap: 15 (ref 5–15)
BUN: 15 mg/dL (ref 6–20)
CALCIUM: 9.8 mg/dL (ref 8.9–10.3)
CO2: 26 mmol/L (ref 22–32)
CREATININE: 0.95 mg/dL (ref 0.61–1.24)
Chloride: 97 mmol/L — ABNORMAL LOW (ref 101–111)
GFR calc non Af Amer: >60 mL/min (ref >60)
Glucose, Bld: 130 mg/dL — ABNORMAL HIGH (ref 65–99)
Potassium: 3 mmol/L — ABNORMAL LOW (ref 3.5–5.1)
Sodium: 138 mmol/L (ref 135–145)

## 2017-01-11 LAB — CREATININE, SERUM: CREATININE: 0.94 mg/dL (ref 0.61–1.24)

## 2017-01-11 LAB — APTT: APTT: 33 s (ref 24–36)

## 2017-01-11 SURGERY — LEFT HEART CATH AND CORONARY ANGIOGRAPHY
Anesthesia: LOCAL

## 2017-01-11 MED ORDER — NITROGLYCERIN IN D5W 200-5 MCG/ML-% IV SOLN
2.0000 ug/min | INTRAVENOUS | Status: DC
  Filled 2017-01-11: qty 250

## 2017-01-11 MED ORDER — HEPARIN (PORCINE) IN NACL 100-0.45 UNIT/ML-% IJ SOLN
1050.0000 [IU]/h | INTRAMUSCULAR | Status: DC
  Administered 2017-01-11: 1050 [IU]/h via INTRAVENOUS
  Filled 2017-01-11: qty 250

## 2017-01-11 MED ORDER — MIDAZOLAM HCL 2 MG/2ML IJ SOLN
INTRAMUSCULAR | Status: DC | PRN
  Administered 2017-01-11: 2 mg via INTRAVENOUS

## 2017-01-11 MED ORDER — VERAPAMIL HCL 2.5 MG/ML IV SOLN
INTRAVENOUS | Status: DC | PRN
  Administered 2017-01-11: 10 mL via INTRA_ARTERIAL

## 2017-01-11 MED ORDER — ASPIRIN EC 81 MG PO TBEC: 81.0000 mg | DELAYED_RELEASE_TABLET | Freq: Every day | ORAL | Status: DC

## 2017-01-11 MED ORDER — EPINEPHRINE PF 1 MG/ML IJ SOLN
0.0000 ug/min | INTRAVENOUS | Status: DC
  Filled 2017-01-11: qty 4

## 2017-01-11 MED ORDER — HEPARIN SODIUM (PORCINE) 1000 UNIT/ML IJ SOLN
INTRAMUSCULAR | Status: DC
Start: 2017-01-12 — End: 2017-01-11
  Filled 2017-01-11: qty 30

## 2017-01-11 MED ORDER — SODIUM CHLORIDE 0.9% FLUSH: 3.0000 mL | Freq: Two times a day (BID) | INTRAVENOUS | Status: DC

## 2017-01-11 MED ORDER — ONDANSETRON HCL 4 MG/2ML IJ SOLN: 4.0000 mg | Freq: Four times a day (QID) | INTRAMUSCULAR | Status: DC | PRN

## 2017-01-11 MED ORDER — IOPAMIDOL (ISOVUE-370) INJECTION 76%
INTRAVENOUS | Status: DC | PRN
  Administered 2017-01-11: 55 mL via INTRA_ARTERIAL

## 2017-01-11 MED ORDER — ATORVASTATIN CALCIUM 20 MG PO TABS
20.0000 mg | ORAL_TABLET | Freq: Every day | ORAL | Status: DC
  Administered 2017-01-13 – 2017-01-16 (×4): 20 mg via ORAL
  Filled 2017-01-11 (×4): qty 1

## 2017-01-11 MED ORDER — TRANEXAMIC ACID 1000 MG/10ML IV SOLN
1.5000 mg/kg/h | INTRAVENOUS | Status: DC
  Filled 2017-01-11: qty 25

## 2017-01-11 MED ORDER — SODIUM CHLORIDE 0.9 % IV SOLN: INTRAVENOUS | Status: AC

## 2017-01-11 MED ORDER — ACETAMINOPHEN 325 MG PO TABS: 650.0000 mg | ORAL_TABLET | ORAL | Status: DC | PRN

## 2017-01-11 MED ORDER — SODIUM CHLORIDE 0.9 % IV SOLN: 250.0000 mL | INTRAVENOUS | Status: DC | PRN

## 2017-01-11 MED ORDER — HYDROCHLOROTHIAZIDE 25 MG PO TABS: 25.0000 mg | ORAL_TABLET | Freq: Every day | ORAL | Status: DC

## 2017-01-11 MED ORDER — CHLORHEXIDINE GLUCONATE CLOTH 2 % EX PADS: 6.0000 | MEDICATED_PAD | Freq: Once | CUTANEOUS | Status: DC

## 2017-01-11 MED ORDER — NITROGLYCERIN IN D5W 200-5 MCG/ML-% IV SOLN
5.0000 ug/min | Freq: Once | INTRAVENOUS | Status: AC
  Administered 2017-01-11: 5 ug/min via INTRAVENOUS
  Filled 2017-01-11: qty 250

## 2017-01-11 MED ORDER — CHLORHEXIDINE GLUCONATE CLOTH 2 % EX PADS
6.0000 | MEDICATED_PAD | Freq: Once | CUTANEOUS | Status: AC
  Administered 2017-01-11: 6 via TOPICAL

## 2017-01-11 MED ORDER — TEMAZEPAM 15 MG PO CAPS: 15.0000 mg | ORAL_CAPSULE | Freq: Once | ORAL | Status: DC | PRN

## 2017-01-11 MED ORDER — IOPAMIDOL (ISOVUE-370) INJECTION 76%
INTRAVENOUS | Status: AC
  Filled 2017-01-11: qty 100

## 2017-01-11 MED ORDER — DEXTROSE 5 % IV SOLN
1.5000 g | INTRAVENOUS | Status: AC
  Administered 2017-01-12: 1.5 g via INTRAVENOUS
  Filled 2017-01-11: qty 1.5

## 2017-01-11 MED ORDER — SODIUM CHLORIDE 0.9 % IV SOLN
INTRAVENOUS | Status: DC
  Administered 2017-01-11: 75 mL/h via INTRAVENOUS

## 2017-01-11 MED ORDER — SODIUM CHLORIDE 0.9 % IV SOLN
30.0000 ug/min | INTRAVENOUS | Status: AC
  Administered 2017-01-12: 25 ug/min via INTRAVENOUS
  Filled 2017-01-11: qty 2

## 2017-01-11 MED ORDER — POTASSIUM CHLORIDE 2 MEQ/ML IV SOLN
80.0000 meq | INTRAVENOUS | Status: DC
  Filled 2017-01-11: qty 40

## 2017-01-11 MED ORDER — VERAPAMIL HCL 2.5 MG/ML IV SOLN
INTRAVENOUS | Status: AC
  Filled 2017-01-11: qty 2

## 2017-01-11 MED ORDER — FENTANYL CITRATE (PF) 100 MCG/2ML IJ SOLN
INTRAMUSCULAR | Status: DC | PRN
  Administered 2017-01-11: 25 ug via INTRAVENOUS

## 2017-01-11 MED ORDER — DOPAMINE-DEXTROSE 3.2-5 MG/ML-% IV SOLN
0.0000 ug/kg/min | INTRAVENOUS | Status: DC
  Filled 2017-01-11: qty 250

## 2017-01-11 MED ORDER — HEPARIN (PORCINE) IN NACL 2-0.9 UNIT/ML-% IJ SOLN
INTRAMUSCULAR | Status: AC | PRN
  Administered 2017-01-11: 1000 mL

## 2017-01-11 MED ORDER — LIDOCAINE HCL (PF) 1 % IJ SOLN
INTRAMUSCULAR | Status: DC | PRN
  Administered 2017-01-11: 2 mL via INTRADERMAL

## 2017-01-11 MED ORDER — TRANEXAMIC ACID (OHS) BOLUS VIA INFUSION
15.0000 mg/kg | INTRAVENOUS | Status: DC
  Filled 2017-01-11: qty 1178

## 2017-01-11 MED ORDER — MAGNESIUM SULFATE 50 % IJ SOLN
40.0000 meq | INTRAMUSCULAR | Status: DC
  Filled 2017-01-11: qty 9.85

## 2017-01-11 MED ORDER — HEPARIN (PORCINE) IN NACL 100-0.45 UNIT/ML-% IJ SOLN
1000.0000 [IU]/h | INTRAMUSCULAR | Status: DC
  Filled 2017-01-11: qty 250

## 2017-01-11 MED ORDER — NITROGLYCERIN 0.4 MG SL SUBL: 0.4000 mg | SUBLINGUAL_TABLET | SUBLINGUAL | Status: DC | PRN

## 2017-01-11 MED ORDER — LIDOCAINE HCL (PF) 1 % IJ SOLN
INTRAMUSCULAR | Status: AC
  Filled 2017-01-11: qty 30

## 2017-01-11 MED ORDER — ACETAMINOPHEN 325 MG PO TABS
650.0000 mg | ORAL_TABLET | ORAL | Status: DC | PRN
  Administered 2017-01-11: 650 mg via ORAL
  Filled 2017-01-11: qty 2

## 2017-01-11 MED ORDER — ASPIRIN 81 MG PO CHEW
324.0000 mg | CHEWABLE_TABLET | Freq: Once | ORAL | Status: AC
  Administered 2017-01-11: 324 mg via ORAL
  Filled 2017-01-11: qty 4

## 2017-01-11 MED ORDER — VANCOMYCIN HCL 10 G IV SOLR
1250.0000 mg | INTRAVENOUS | Status: AC
  Administered 2017-01-12: 1250 mg via INTRAVENOUS
  Filled 2017-01-11: qty 1250

## 2017-01-11 MED ORDER — FENTANYL CITRATE (PF) 100 MCG/2ML IJ SOLN
INTRAMUSCULAR | Status: AC
  Filled 2017-01-11: qty 2

## 2017-01-11 MED ORDER — MIDAZOLAM HCL 2 MG/2ML IJ SOLN
INTRAMUSCULAR | Status: AC
  Filled 2017-01-11: qty 2

## 2017-01-11 MED ORDER — HEPARIN SODIUM (PORCINE) 1000 UNIT/ML IJ SOLN
INTRAMUSCULAR | Status: AC
  Filled 2017-01-11: qty 1

## 2017-01-11 MED ORDER — BISACODYL 5 MG PO TBEC: 5.0000 mg | DELAYED_RELEASE_TABLET | Freq: Once | ORAL | Status: DC

## 2017-01-11 MED ORDER — NITROGLYCERIN 0.4 MG SL SUBL
0.4000 mg | SUBLINGUAL_TABLET | Freq: Once | SUBLINGUAL | Status: AC
  Administered 2017-01-11: 0.4 mg via SUBLINGUAL
  Filled 2017-01-11: qty 1

## 2017-01-11 MED ORDER — DEXTROSE 5 % IV SOLN
1.5000 g | INTRAVENOUS | Status: DC
  Filled 2017-01-11: qty 1.5

## 2017-01-11 MED ORDER — DEXTROSE 5 % IV SOLN
750.0000 mg | INTRAVENOUS | Status: DC
  Filled 2017-01-11: qty 750

## 2017-01-11 MED ORDER — TRANEXAMIC ACID (OHS) BOLUS VIA INFUSION
15.0000 mg/kg | INTRAVENOUS | Status: AC
  Administered 2017-01-12: 1177.5 mg via INTRAVENOUS
  Filled 2017-01-11: qty 1178

## 2017-01-11 MED ORDER — SODIUM CHLORIDE 0.9% FLUSH: 3.0000 mL | INTRAVENOUS | Status: DC | PRN

## 2017-01-11 MED ORDER — SODIUM CHLORIDE 0.9 % IV SOLN
INTRAVENOUS | Status: DC
  Filled 2017-01-11: qty 30

## 2017-01-11 MED ORDER — HEPARIN (PORCINE) IN NACL 2-0.9 UNIT/ML-% IJ SOLN
INTRAMUSCULAR | Status: AC
  Filled 2017-01-11: qty 1000

## 2017-01-11 MED ORDER — DEXMEDETOMIDINE HCL IN NACL 400 MCG/100ML IV SOLN
0.1000 ug/kg/h | INTRAVENOUS | Status: DC
  Filled 2017-01-11: qty 100

## 2017-01-11 MED ORDER — SODIUM CHLORIDE 0.9 % IV SOLN
1250.0000 mg | INTRAVENOUS | Status: DC
  Filled 2017-01-11: qty 1250

## 2017-01-11 MED ORDER — SODIUM CHLORIDE 0.9 % IV SOLN
INTRAVENOUS | Status: AC
  Administered 2017-01-12: .8 [IU]/h via INTRAVENOUS
  Filled 2017-01-11: qty 1

## 2017-01-11 MED ORDER — CHLORHEXIDINE GLUCONATE CLOTH 2 % EX PADS
6.0000 | MEDICATED_PAD | Freq: Once | CUTANEOUS | Status: AC
  Administered 2017-01-12: 6 via TOPICAL

## 2017-01-11 MED ORDER — POTASSIUM CHLORIDE CRYS ER 20 MEQ PO TBCR
60.0000 meq | EXTENDED_RELEASE_TABLET | Freq: Once | ORAL | Status: AC
  Administered 2017-01-11: 60 meq via ORAL
  Filled 2017-01-11: qty 3

## 2017-01-11 MED ORDER — HEPARIN BOLUS VIA INFUSION
4000.0000 [IU] | Freq: Once | INTRAVENOUS | Status: AC
  Administered 2017-01-11: 4000 [IU] via INTRAVENOUS

## 2017-01-11 MED ORDER — METOPROLOL TARTRATE 12.5 MG HALF TABLET
12.5000 mg | ORAL_TABLET | Freq: Once | ORAL | Status: AC
  Administered 2017-01-12: 12.5 mg via ORAL
  Filled 2017-01-11: qty 1

## 2017-01-11 MED ORDER — PLASMA-LYTE 148 IV SOLN
INTRAVENOUS | Status: AC
  Administered 2017-01-12: 500 mL
  Filled 2017-01-11: qty 2.5

## 2017-01-11 MED ORDER — METOPROLOL TARTRATE 12.5 MG HALF TABLET: 12.5000 mg | ORAL_TABLET | Freq: Once | ORAL | Status: DC

## 2017-01-11 MED ORDER — CHLORHEXIDINE GLUCONATE 0.12 % MT SOLN
15.0000 mL | Freq: Once | OROMUCOSAL | Status: AC
  Administered 2017-01-12: 15 mL via OROMUCOSAL
  Filled 2017-01-11: qty 15

## 2017-01-11 MED ORDER — DEXMEDETOMIDINE HCL IN NACL 400 MCG/100ML IV SOLN
0.1000 ug/kg/h | INTRAVENOUS | Status: AC
  Administered 2017-01-12: .3 ug/kg/h via INTRAVENOUS
  Filled 2017-01-11: qty 100

## 2017-01-11 MED ORDER — CHLORHEXIDINE GLUCONATE 0.12 % MT SOLN
15.0000 mL | Freq: Once | OROMUCOSAL | Status: DC
  Filled 2017-01-11: qty 15

## 2017-01-11 MED ORDER — PLASMA-LYTE 148 IV SOLN
INTRAVENOUS | Status: DC
  Filled 2017-01-11: qty 2.5

## 2017-01-11 MED ORDER — SODIUM CHLORIDE 0.9 % IV SOLN
INTRAVENOUS | Status: DC
  Filled 2017-01-11: qty 1

## 2017-01-11 MED ORDER — HEPARIN SODIUM (PORCINE) 1000 UNIT/ML IJ SOLN
INTRAMUSCULAR | Status: DC | PRN
  Administered 2017-01-11: 4000 [IU] via INTRAVENOUS

## 2017-01-11 MED ORDER — TRANEXAMIC ACID 1000 MG/10ML IV SOLN
1.5000 mg/kg/h | INTRAVENOUS | Status: AC
  Administered 2017-01-12: 1.5 mg/kg/h via INTRAVENOUS
  Filled 2017-01-11: qty 25

## 2017-01-11 MED ORDER — SODIUM CHLORIDE 0.9 % IV SOLN
30.0000 ug/min | INTRAVENOUS | Status: DC
  Filled 2017-01-11: qty 2

## 2017-01-11 MED ORDER — TRANEXAMIC ACID (OHS) PUMP PRIME SOLUTION
2.0000 mg/kg | INTRAVENOUS | Status: DC
  Filled 2017-01-11: qty 1.57

## 2017-01-11 SURGICAL SUPPLY — 11 items
CATH INFINITI 5 FR JL3.5 (CATHETERS) ×1 IMPLANT
CATH INFINITI JR4 5F (CATHETERS) ×2 IMPLANT
DEVICE RAD COMP TR BAND LRG (VASCULAR PRODUCTS) ×1 IMPLANT
GLIDESHEATH SLEND SS 6F .021 (SHEATH) ×1 IMPLANT
GUIDEWIRE INQWIRE 1.5J.035X260 (WIRE) IMPLANT
INQWIRE 1.5J .035X260CM (WIRE) ×2
KIT HEART LEFT (KITS) ×2 IMPLANT
PACK CARDIAC CATHETERIZATION (CUSTOM PROCEDURE TRAY) ×2 IMPLANT
TRANSDUCER W/STOPCOCK (MISCELLANEOUS) ×2 IMPLANT
TUBING CIL FLEX 10 FLL-RA (TUBING) ×2 IMPLANT
WIRE MICROINTRODUCER 60CM (WIRE) ×2 IMPLANT

## 2017-01-11 NOTE — H&P (Signed)
Cardiology History and Physical    Patient ID: Christopher Booth; [id number]; Jan 07, 1941   Admit date: 01/11/2017 Date of Admission: 01/11/2017  Primary Care Provider: Sinda Du, MD Primary Cardiologist: Previously evaluated by [name] in 2013  Patient Profile    Christopher Booth is a 76 y.o. male with past medical history of hyperaldosteronism, HTN, and prediabetes who is being seen today for the evaluation of NSTEMI at the request of Dr. Alvino Chapel.   History of Present Illness    Christopher Booth was previously evaluated by cardiology in 03/2011 for intermittent chest pain and palpitations, found to have frequent PVCs. An exercise Myoview was obtained at that time and showed no significant ischemia or evidence of scar. He was placed on different ARB therapy and palpitations resolved at that time.    In talking with the patient today, he reports having episodes of bilateral jaw pain for the past several weeks. He initially thought this was musculoskeletal and has been taking Tylenol as needed. Over the past week, he had started to notice associated substernal chest discomfort which occurred with the pain but would spontaneously resolve. 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  This morning, he developed the bilateral jaw pain and substernal chest discomfort which awoke him from sleep and prompted him to come to the ED for further evaluation. He was given a sublingual nitroglycerin with complete resolution of his symptoms. He denies any discomfort at this current time.  He denies any known history of CAD. His last ischemic evaluation was a low-risk NST in 03/2011.  He is followed by Nephrology for hyperaldosteronism and remains on Eplerenone and HCTZ. Reports BP is usually well-controlled. He does not exercise regularly but is the primary caregiver for his wife who suffered a  CVA 8+ years ago. Performs all of the household chores and landscaping.    Initial labs showed WBC of 10.8, Hgb 16.0, and platelets 293. K+ 3.0 with creatinine of 0.95. Initial troponin 0.20. Initial EKG showing NSR, HR 76, with slight ST depression along anterior leads, improved on repeat tracings. CXR with no acute cardiopulmonary abnormalities.    Past Medical History:  Diagnosis Date  . Arthritis   . Essential hypertension, benign   . Gynecomastia   . Hyperaldosteronism   . Spinal stenosis   . Temporomandibular joint disease   . Wears glasses     Past Surgical History:  Procedure Laterality Date  . CATARACT EXTRACTION W/PHACO Right 01/28/2015   Procedure: CATARACT EXTRACTION PHACO AND INTRAOCULAR LENS PLACEMENT RIGHT EYE;  Surgeon: Williams Che, MD;  Location: [facility];  Service: Ophthalmology;  Laterality: Right;  CDE:27.23  . CATARACT EXTRACTION W/PHACO Left 04/14/2015   Procedure: CATARACT EXTRACTION PHACO AND INTRAOCULAR LENS PLACEMENT LEFT EYE CDE=15.86;  Surgeon: Williams Che, MD;  Location: [facility];  Service: Ophthalmology;  Laterality: Left;  . CERVICAL FUSION  1993  . CHOLECYSTECTOMY  1995  . COLONOSCOPY    . KNEE ARTHROSCOPY Right 10/09/2013   Procedure: RIGHT ARTHROSCOPY KNEE Partial medial and lateral meniscal tear and chondroplasty;  Surgeon: Hessie Dibble, MD;  Location: [place];  Service: Orthopedics;  Laterality: Right;  Partial medial and lateral meniscal tear and chondroplasty   . TONSILLECTOMY       Home Medications:  Prior to Admission medications   Medication Sig Start Date End Date Taking?  Authorizing Provider  acetaminophen (TYLENOL) 500 MG tablet Take 1,000 mg by mouth every 6 (six) hours as needed for mild pain.    [provider]  atorvastatin (LIPITOR) 20 MG tablet Take 20 mg by mouth daily.    [provider]  eplerenone (INSPRA) 25 MG tablet Take 50 mg by mouth 2 (two) times daily.     [provider]  hydrochlorothiazide (HYDRODIURIL) 25 MG tablet Take 25 mg by mouth daily.    [provider]  Multiple Vitamins-Minerals (MULTI COMPLETE PO) Take by mouth daily.    [provider]    Inpatient Medications: Scheduled Meds: . potassium chloride  60 mEq Oral Once   Continuous Infusions: . heparin 1,050 Units/hr (01/11/17 0927)   PRN Meds:   Allergies:    Allergies  Allergen Reactions  . Diovan [Valsartan]     unknown    Social History:   Social History   Socioeconomic History  . Marital status: Married    Spouse name: Not on file  . Number of children: Not on file  . Years of education: Not on file  . Highest education level: Not on file  Social Needs  . Financial resource strain: Not on file  . Food insecurity - worry: Not on file  . Food insecurity - inability: Not on file  . Transportation needs - medical: Not on file  . Transportation needs - non-medical: Not on file  Occupational History  . Occupation: Pension scheme manager: RETIRED    Comment: Retired  Tobacco Use  . Smoking status: Never Smoker  . Smokeless tobacco: Never Used  Substance and Sexual Activity  . Alcohol use: Yes    Comment: Occasional  . Drug use: No  . Sexual activity: No    Birth control/protection: None  Other Topics Concern  . Not on file  Social History Narrative  . Not on file     Family History:    Family History  Problem Relation Age of Onset  . Hypertension Unknown   . Cancer Brother        Colon  . Cancer Brother        Melanoma      Review of Systems    General:  No chills, fever, night sweats or weight changes.  Cardiovascular:  No dyspnea on exertion, edema, orthopnea, palpitations, paroxysmal nocturnal dyspnea. Positive for chest pain.  Dermatological: No rash, lesions/masses Respiratory: No cough, dyspnea Urologic: No hematuria, dysuria Abdominal:   No nausea, vomiting, diarrhea, bright red blood per rectum, melena, or  hematemesis Neurologic:  No visual changes, wkns, changes in mental status.  All other systems reviewed and are otherwise negative except as noted above.  Physical Exam/Data    Vitals:   01/11/17 0800 01/11/17 0830 01/11/17 0900 01/11/17 0930  BP: (!) 148/97 (!) 143/116 112/79 127/84  Pulse: 78  69   Resp: (!) 21 13 15 14   SpO2: 100%  100%   Weight:      Height:       No intake or output data in the 24 hours ending 01/11/17 1006 Filed Weights   01/11/17 0745  Weight: 173 lb (78.5 kg)   Body mass index is 27.1 kg/m.   General: Pleasant, Caucasian male appearing in NAD Psych: Normal affect. Neuro: Alert and oriented X 3. Moves all extremities spontaneously. HEENT: Normal  Neck: Supple without bruits or JVD. Lungs:  Resp regular and unlabored, CTA without wheezing or rales. Heart: RRR  no s3, s4, or murmurs. Abdomen: Soft, non-tender, non-distended, BS + x 4.  Extremities: No clubbing, cyanosis or edema. DP/PT/Radials 2+ and equal bilaterally.   EKG:  The EKG was personally reviewed and demonstrates: NSR, HR 76, with slight ST depression along anterior leads, improved on repeat tracings.   Telemetry:  Telemetry was personally reviewed and demonstrates:  NSR with frequent PVC's. 3 beats NSVT.    Labs/Studies     Relevant CV Studies:  Echocardiogram: 03/2011 Study Conclusions  - Left ventricle: The cavity size was normal. There was mild focal basal hypertrophy of the septum. Systolic function was normal. The estimated ejection fraction was in the range of 60% to 65%. Wall motion was normal; there were no regional wall motion abnormalities. Doppler parameters are consistent with abnormal left ventricular relaxation (grade 1 diastolic dysfunction). - Aortic valve: Trivial regurgitation. - Mitral valve: Calcified annulus. Trivial regurgitation. - Left atrium: The atrium was at the upper limits of normal in size. - Tricuspid valve: Trivial  regurgitation. - Pericardium, extracardiac: There was no pericardial effusion.  Laboratory Data:  Chemistry Recent Labs  Lab 01/11/17 0749  NA 138  K 3.0*  CL 97*  CO2 26  GLUCOSE 130*  BUN 15  CREATININE 0.95  CALCIUM 9.8  GFRNONAA >60  GFRAA >60  ANIONGAP 15    No results for input(s): PROT, ALBUMIN, AST, ALT, ALKPHOS, BILITOT in the last 168 hours. Hematology Recent Labs  Lab 01/11/17 0749  WBC 10.8*  RBC 5.26  HGB 16.0  HCT 46.2  MCV 87.8  MCH 30.4  MCHC 34.6  RDW 12.3  PLT 293   Cardiac Enzymes Recent Labs  Lab 01/11/17 0749  TROPONINI 0.20*   No results for input(s): TROPIPOC in the last 168 hours.   Radiology/Studies:  Dg Chest 2 View  Result Date: 01/11/2017 CLINICAL DATA:  Chest pain. EXAM: CHEST  2 VIEW COMPARISON:  01/12/2001 report . FINDINGS: Mediastinum and hilar structures normal. Heart size normal. No focal infiltrate. No pleural effusion or pneumothorax. Degenerative changes thoracic spine. IMPRESSION: No acute cardiopulmonary disease. Electronically Signed   By: Marcello Moores  Register   On: 01/11/2017 08:54     Assessment & Plan    1. NSTEMI - the patient has been experiencing episodes of bilateral jaw pain for the past several weeks and recently noticed it was radiating into his chest. No association with exertion. This morning, he developed jaw pain along with substernal chest pain which awoke him from sleep. Pain resolved with SL NTG.  - Initial troponin 0.20. EKG shows NSR, HR 76, with slight ST depression along anterior leads, improved on repeat tracings. Continue to cycle cardiac enzymes. - with his presenting symptoms and elevated cardiac enzymes, will plan for a cardiac catheterization later today for definitive evaluation. The patient understands that risks include but are not limited to stroke (1 in 1000), death (1 in 31), kidney failure [usually temporary] (1 in 500), bleeding (1 in 200), allergic reaction [possibly serious] (1 in  200). Continue Heparin along with ASA and statin therapy. BP soft at this time but consider addition of BB therapy if BP allows.   2.Hyperaldosteronism - hold PTA Eplerenone and HCTZ today pending cardiac catheterization. Anticipate resuming tomorrow if creatinine remains stable.   3. HLD - followed by PCP. Continue Atorvastatin 20mg  daily. - recheck FLP.   4. Hypokalemia - K+ 3.0. Will replace prior to cath.    For questions or updates, please contact [facility] Please consult [email or web address] for contact  info under Cardiology/STEMI.  Signed, Bernerd Pho, PA-C 01/11/2017, 10:06 AM Pager: [phone]   Attending note:  Patient seen and examined. Reviewed records and discussed the case with Ms. Delano Metz. Christopher Booth presents to the hospital with recent, intermittent jaw and upper chest discomfort, fairly sporadic within the last few weeks culminating in an episode of intense pain that woke him up this morning. He presented to the ER with ECG showing nonspecific ST segment changes, initial troponin I 0.20. Symptoms improved after aspirin and nitroglycerin. He has a history of hypertension, hyperaldosteronism, and hyperlipidemia. He underwent cardiac testing back in 2013 for evaluation of PVCs, had a low risk exercise Myoview at that point. PVCs resolved with changes in his medications. He is a retired Music therapist, previously worked at [employer].  On examination in the ER he reports no residual chest discomfort, on heparin infusion. Systolic blood pressure ranging 110-140, heart rate in the 70s in sinus rhythm by telemetry which I reviewed. Lungs are clear without labored breathing. Cardiac exam reveals RRR without gallop. He has no peripheral edema. Radial pulses are full. Lab work shows potassium 3.0, BUN 15, creatinine 0.95, troponin I 0.20, hemoglobin 16.0, platelets 293. Chest x-ray reports no acute process. ECG which I personally reviewed shows sinus rhythm with left  anterior sick or block, fairly diffuse nonspecific ST-T wave changes with flattening and mild depression, also mild elevation in aVR.  Patient presents with unstable angina, increase in troponin I of 0.20 consistent with involving ACS. His ECG is overall nonspecific with concern for global ischemia, diffuse ST-T segment changes with some elevation in aVR. No prior history of obstructive CAD with reassuring workup in 2013. He is currently pain-free on heparin. Has received aspirin and nitroglycerin. He is on Lipitor at home which will be continued. After discussion with patient, plan is to have him transferred to [facility] for a cardiac catheterization today. He is in agreement to proceed.  Satira Sark, M.D., F.A.C.C.

## 2017-01-11 NOTE — Progress Notes (Signed)
  Echocardiogram 2D Echocardiogram has been performed.  Bobbye Charleston 01/11/2017, 7:38 PM

## 2017-01-11 NOTE — Consult Note (Signed)
[address]       [place] [address]             [phone]        Garrie J [facility]cl[facility] [place] Medical Record #[id number] Date of Birth: 06-07-1940  Referring:Dr Irish Lack Cardiology: Dr Domenic Polite Primary Care: Sinda Du, MD  Chief Complaint:    Chief Complaint  Patient presents with  . 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  He had mild symptoms similar for the past 2 weeks, but because they were becoming worse this morning he went to the [facility] emergency room 7:00 this morning.  Is transferred to [facility] was delayed until this afternoon because of bed availability. Cardiac catheterization was performed late this afternoon noon demonstrating significant three-vessel coronary artery disease, with very high-grade proximal circumflex lesion.  The patient is now comfortable without chest pain.    Current Activity/ Functional Status: Patient is independent with mobility/ambulation, transfers, ADL's, IADL's.  And cares for his wife who had a stroke approximately 7 years ago   Zubrod Score: At the time of surgery this patient's most appropriate activity status/level should be described as: [x]     0    Normal activity, no symptoms []     1    Restricted in physical strenuous activity but ambulatory, able to do out light work []     2    Ambulatory and capable of self care, unable to do work activities, up and about                 more than 50%  Of the time                            []     3    Only limited self care, in bed greater than 50% of waking hours []     4    Completely disabled, no self care, confined to bed or chair []     5    Moribund  Past Medical History:  Diagnosis Date  . Arthritis   . Essential hypertension, benign   . Gynecomastia   . Hyperaldosteronism   . Spinal  stenosis   . Temporomandibular joint disease   . Wears glasses     Past Surgical History:  Procedure Laterality Date  . CATARACT EXTRACTION W/PHACO Right 01/28/2015   Procedure: CATARACT EXTRACTION PHACO AND INTRAOCULAR LENS PLACEMENT RIGHT EYE;  Surgeon: Williams Che, MD;  Location: [facility];  Service: Ophthalmology;  Laterality: Right;  CDE:27.23  . CATARACT EXTRACTION W/PHACO Left 04/14/2015   Procedure: CATARACT EXTRACTION PHACO AND INTRAOCULAR LENS PLACEMENT LEFT EYE CDE=15.86;  Surgeon: Williams Che, MD;  Location: [facility];  Service: Ophthalmology;  Laterality: Left;  . CERVICAL FUSION  1993  . CHOLECYSTECTOMY  1995  . COLONOSCOPY    . KNEE ARTHROSCOPY Right 10/09/2013   Procedure: RIGHT ARTHROSCOPY KNEE Partial medial and lateral meniscal tear and chondroplasty;  Surgeon: Hessie Dibble, MD;  Location: [facility];  Service: Orthopedics;  Laterality: Right;  Partial medial and lateral meniscal tear and chondroplasty   . TONSILLECTOMY      Social History   Tobacco Use  Smoking Status Never Smoker  Smokeless Tobacco Never Used  Social History   Substance and Sexual Activity  Alcohol Use Yes   Comment: Occasional    Social History   Socioeconomic History  . Marital status: Married    Spouse name: Not on file  . Number of children: Not on file  . Years of education: Not on file  . Highest education level: Not on file  Occupational History  . Occupation: Pension scheme manager: RETIRED    Comment: Retired  Tobacco Use  . Smoking status: Never Smoker  . Smokeless tobacco: Never Used  Substance and Sexual Activity  . Alcohol use: Yes    Comment: Occasional  . Drug use: No    Allergies  Allergen Reactions  . Diovan [Valsartan]     unknown    Current Facility-Administered Medications  Medication Dose Route Frequency Provider Last Rate Last Dose  . 0.9 %  sodium chloride infusion   Intravenous Continuous Erma Heritage, PA-C 75  mL/hr at 01/11/17 1117 75 mL/hr at 01/11/17 1117  . 0.9 %  sodium chloride infusion   Intravenous Continuous Jettie Booze, MD        Medications Prior to Admission  Medication Sig Dispense Refill Last Dose  . acetaminophen (TYLENOL) 500 MG tablet Take 500 mg by mouth every 6 (six) hours as needed for mild pain.    01/11/2017 at 0615  . atorvastatin (LIPITOR) 20 MG tablet Take 20 mg by mouth daily.   01/10/2017 at Unknown time  . eplerenone (INSPRA) 25 MG tablet Take 50 mg by mouth 2 (two) times daily.    01/11/2017 at 0615  . hydrochlorothiazide (HYDRODIURIL) 25 MG tablet Take 25 mg by mouth daily.   01/11/2017 at 0615  . Multiple Vitamins-Minerals (MULTI COMPLETE PO) Take by mouth daily.   01/11/2017 at 0615    Family History  Problem Relation Age of Onset  . Hypertension Unknown   . Cancer Brother        Colon  . Cancer Brother        Melanoma     Review of Systems:   Review of Systems  Constitutional: Positive for malaise/fatigue. Negative for chills, diaphoresis, fever and weight loss.  HENT: Negative.   Eyes: Negative.   Respiratory: Negative.   Cardiovascular: Positive for chest pain and palpitations. Negative for orthopnea, claudication, leg swelling and PND.  Gastrointestinal: Negative.   Genitourinary: Positive for frequency. Negative for dysuria and flank pain.  Musculoskeletal: Negative.   Skin: Negative for itching and rash.  Neurological: Positive for weakness.  Endo/Heme/Allergies: Negative.   Psychiatric/Behavioral: Negative.   Patient has previous neck surgery, but notes that he has no limitation in movement of his neck at this time Pertinent items are noted in HPI.      Physical Exam: BP 132/82   Pulse 73   Temp 98 F (36.7 C) (Oral)   Resp 14   Ht 5\' 7"  (1.702 m)   Wt 173 lb (78.5 kg)   SpO2 96%   BMI 27.10 kg/m    General appearance: alert, cooperative, appears stated age and no distress Head: Normocephalic, without obvious  abnormality, atraumatic Neck: no adenopathy, no carotid bruit, no JVD, supple, symmetrical, trachea midline and thyroid not enlarged, symmetric, no tenderness/mass/nodules Lymph nodes: Cervical, supraclavicular, and axillary nodes normal. Resp: clear to auscultation bilaterally Back: symmetric, no curvature. ROM normal. No CVA tenderness. Cardio: regular rate and rhythm, S1, S2 normal, no murmur, click, rub or gallop GI: soft, non-tender; bowel sounds normal; no masses,  no organomegaly Extremities: extremities normal, atraumatic, no cyanosis or edema Neurologic: Grossly normal  Diagnostic Studies & Laboratory data:     Recent Radiology Findings:   Dg Chest 2 View  Result Date: 01/11/2017 CLINICAL DATA:  Chest pain. EXAM: CHEST  2 VIEW COMPARISON:  01/12/2001 report . FINDINGS: Mediastinum and hilar structures normal. Heart size normal. No focal infiltrate. No pleural effusion or pneumothorax. Degenerative changes thoracic spine. IMPRESSION: No acute cardiopulmonary disease. Electronically Signed   By: Marcello Moores  Register   On: 01/11/2017 08:54     I have independently reviewed the above radiologic studies.  Recent Lab Findings: Lab Results  Component Value Date   WBC 10.8 (H) 01/11/2017   HGB 16.0 01/11/2017   HCT 46.2 01/11/2017   PLT 293 01/11/2017   GLUCOSE 130 (H) 01/11/2017   NA 138 01/11/2017   K 3.0 (L) 01/11/2017   CL 97 (L) 01/11/2017   CREATININE 0.95 01/11/2017   BUN 15 01/11/2017   CO2 26 01/11/2017   INR 0.94 01/11/2017   Echocardiogram pending Preop Dopplers pending  Cardiac cath: Procedures   LEFT HEART CATH AND CORONARY ANGIOGRAPHY  Conclusion     Ost Cx to Prox Cx lesion is 90% stenosed.  Mid LM to Dist LM lesion is 50% stenosed.  Prox to mid LAD lesion is 70% stenosed. Eccentric, best seen in the RAO caudal view.  Ost LAD to Prox LAD lesion is 40% stenosed.  Ost RCA to Prox RCA lesion is 80% stenosed.  Dist RCA lesion is 80% stenosed.  The  left ventricular ejection fraction is 45-50% by visual estimate.  There is mild left ventricular systolic dysfunction.  LV end diastolic pressure is normal.  There is no aortic valve stenosis.   Multivessel disease with culprit lesion being heavily calcified distal left main lesion extending into the circumflex ostium.   Coronary Findings   Diagnostic  Dominance: Right  Left Main  Mid LM to Dist LM lesion 50% stenosed  Mid LM to Dist LM lesion is 50% stenosed. The lesion is calcified.  Left Anterior Descending  Ost LAD to Prox LAD lesion 40% stenosed  Ost LAD to Prox LAD lesion is 40% stenosed. The lesion is calcified.  Prox LAD lesion 70% stenosed  Prox LAD lesion is 70% stenosed.  Left Circumflex  Ost Cx to Prox Cx lesion 90% stenosed  Ost Cx to Prox Cx lesion is 90% stenosed.  Right Coronary Artery  Ost RCA to Prox RCA lesion 80% stenosed  Ost RCA to Prox RCA lesion is 80% stenosed.  Dist RCA lesion 80% stenosed  Dist RCA lesion is 80% stenosed.  Intervention   No interventions have been documented.  Wall Motion   Resting               Left Heart   Left Ventricle The left ventricular size is normal. There is mild left ventricular systolic dysfunction. LV end diastolic pressure is normal. The left ventricular ejection fraction is 45-50% by visual estimate. There are LV function abnormalities due to segmental dysfunction.  Aortic Valve There is no aortic valve stenosis.  Coronary Diagrams   Diagnostic Diagram         I have independently reviewed the above  cath films and reviewed the findings with the  patient .  Lab Results  Component Value Date   TROPONINI 0.20 ([place]) 01/11/2017     Assessment / Plan:   Patient with non-STEMI myocardial infarction, now pain-free with cardiac catheterization showing complex  high-grade proximal circumflex lesion in addition left main 50%, 80-85% proximal LAD overall preserved LV function.  With the patient's-new onset of  symptoms and high-grade three-vessel coronary artery disease coronary artery bypass grafting tomorrow morning is recommended to the patient.  Risks and options were discussed in detail with the patient and his wife. Patient to admitted ICU, pharmacy consulted to start heparin tonight,   Patient also has history of primary hyper aldosteronism-treated by [place]   The goals risks and alternatives of the planned surgical procedure Procedure(s): LEFT HEART CATH AND CORONARY ANGIOGRAPHY (N/A)  have been discussed with the patient in detail. The risks of the procedure including death, infection, stroke, myocardial infarction, bleeding, blood transfusion have all been discussed specifically.  I have quoted Katha Cabal a 2% of perioperative mortality and a complication rate as high as 35%. The patient's questions have been answered.STANLY SI is willing  to proceed with the planned procedure.  I  spent 40 minutes counseling the patient face to face and 50% or more the  time was spent in counseling and coordination of care.   Grace Isaac MD      [address] [place] [address] Office [phone]   Beeper [phone]  01/11/2017 6:30 PM

## 2017-01-11 NOTE — Interval H&P Note (Signed)
Cath Lab Visit (complete for each Cath Lab visit)  Clinical Evaluation Leading to the Procedure:   ACS: Yes.    Non-ACS:    Anginal Classification: CCS IV  Anti-ischemic medical therapy: Minimal Therapy (1 class of medications)  Non-Invasive Test Results: No non-invasive testing performed  Prior CABG: No previous CABG   NSTEMI. No bleeding problems.   History and Physical Interval Note:  01/11/2017 4:02 PM  Christopher Booth  has presented today for surgery, with the diagnosis of cp  The various methods of treatment have been discussed with the patient and family. After consideration of risks, benefits and other options for treatment, the patient has consented to  Procedure(s): LEFT HEART CATH AND CORONARY ANGIOGRAPHY (N/A) as a surgical intervention .  The patient's history has been reviewed, patient examined, no change in status, stable for surgery.  I have reviewed the patient's chart and labs.  Questions were answered to the patient's satisfaction.     Larae Grooms

## 2017-01-11 NOTE — ED Triage Notes (Signed)
Pt reports vague pain in chest and jaws for the past couple of weeks.  Reports this morning his pain was more severe.  Reports pain in jaws, neck glands, sternum, and vague left arm pain.

## 2017-01-11 NOTE — ED Notes (Signed)
Going to xray 

## 2017-01-11 NOTE — Progress Notes (Signed)
Pre-op Cardiac Surgery  Carotid Findings:  Bilateral 1-39% ICA stenosis, antegrade vertebral flow.  Upper Extremity Right Left  Brachial Pressures n/a 110  Radial Waveforms Tri Tri  Ulnar Waveforms Tri Tri  Palmar Arch (Allen's Test) Waveform obliterates with radial compression and is unchanged with ulnar compression. Waveform decreases greater than 50% with radial compression and decreases slightly with ulnar compression.   Findings:  Patient has TR band, Allen's test may be skewed? unable to get BP.  Lower  Extremity Right Left  Dorsalis Pedis 138, Bi 141, Tri  Posterior Tibial 151, Tri 135, Tri  Ankle/Brachial Indices 1.37 1.28    Lita Mains- RDMS, RVT 6:51 PM  01/11/2017

## 2017-01-11 NOTE — ED Notes (Signed)
[facility] in route, report given, repeated EKG

## 2017-01-11 NOTE — Progress Notes (Signed)
ANTICOAGULATION CONSULT NOTE - Initial Consult  Pharmacy Consult for Heparin Indication: chest pain/ACS  Allergies  Allergen Reactions  . Diovan [Valsartan]     unknown    Patient Measurements: Height: 5\' 7"  (170.2 cm) Weight: 173 lb (78.5 kg) IBW/kg (Calculated) : 66.1 HEPARIN DW (KG): 78.5   Vital Signs: BP: 148/97 (12/18 0800) Pulse Rate: 78 (12/18 0800)  Labs: Recent Labs    01/11/17 0749  HGB 16.0  HCT 46.2  PLT 293  CREATININE 0.95  TROPONINI 0.20*    Estimated Creatinine Clearance: 62.8 mL/min (by C-G formula based on SCr of 0.95 mg/dL).   Medical History: Past Medical History:  Diagnosis Date  . Arthritis   . Essential hypertension, benign   . Gynecomastia   . Hyperaldosteronism   . Spinal stenosis   . Temporomandibular joint disease   . Wears glasses    Medications:   (Not in a hospital admission)  Assessment: Okay for Protocol, baseline anticoagulation labs pending.  No bleeding noted.  Elevated troponin.  Goal of Therapy:  Heparin level 0.3-0.7 units/ml   Plan:  Give 4000 units bolus x 1 Start heparin infusion at 1050 units/hr Check anti-Xa level in 6-8 hours and daily while on heparin Continue to monitor H&H and platelets  Pricilla Larsson 01/11/2017,8:49 AM

## 2017-01-11 NOTE — ED Provider Notes (Addendum)
[facility] EMERGENCY DEPARTMENT Provider Note   CSN: [id number] Arrival date & time: 01/11/17  0740     History   Chief Complaint Chief Complaint  Patient presents with  . Chest Pain    HPI Christopher Booth is a 76 y.o. male.  HPI Patient presents with chest pain and jaw pain.  States that he has had pain below his jaw for the last couple weeks.  States it feels full there are 2.  No change with eating.  The pain comes and goes.  Lasts around 20 minutes prior to today but he woke up with the pain today it has been going on for around 2 hours.  States that will go down to his chest.  States the pain gets worse when he bends over.  States that today it actually went down to his left arm.  States before today has not had any of the pain going down his arm.  No change in his physical activity.  Is able to do his ADLs and the same activities without any jaw pain or shortness of breath.  He does not smoke.  Previous PVCs and history of hypertension.  Around 5 years ago had negative stress test.  No swelling in his legs.  No difficulty swallowing or change with eating. Past Medical History:  Diagnosis Date  . Arthritis   . Essential hypertension, benign   . Gynecomastia   . Hyperaldosteronism   . Spinal stenosis   . Temporomandibular joint disease   . Wears glasses     Patient Active Problem List   Diagnosis Date Noted  . PVCs (premature ventricular contractions) 03/29/2011  . Precordial pain 03/29/2011  . Nonspecific abnormal electrocardiogram (ECG) (EKG) 03/29/2011  . Essential hypertension, benign 03/29/2011  . Mixed hyperlipidemia 03/29/2011    Past Surgical History:  Procedure Laterality Date  . CATARACT EXTRACTION W/PHACO Right 01/28/2015   Procedure: CATARACT EXTRACTION PHACO AND INTRAOCULAR LENS PLACEMENT RIGHT EYE;  Surgeon: Williams Che, MD;  Location: [facility];  Service: Ophthalmology;  Laterality: Right;  CDE:27.23  . CATARACT EXTRACTION W/PHACO Left 04/14/2015   Procedure: CATARACT EXTRACTION PHACO AND INTRAOCULAR LENS PLACEMENT LEFT EYE CDE=15.86;  Surgeon: Williams Che, MD;  Location: [facility];  Service: Ophthalmology;  Laterality: Left;  . CERVICAL FUSION  1993  . CHOLECYSTECTOMY  1995  . COLONOSCOPY    . KNEE ARTHROSCOPY Right 10/09/2013   Procedure: RIGHT ARTHROSCOPY KNEE Partial medial and lateral meniscal tear and chondroplasty;  Surgeon: Hessie Dibble, MD;  Location: [facility];  Service: Orthopedics;  Laterality: Right;  Partial medial and lateral meniscal tear and chondroplasty   . TONSILLECTOMY         Home Medications    Prior to Admission medications   Medication Sig Start Date End Date Taking? Authorizing Provider  acetaminophen (TYLENOL) 500 MG tablet Take 500 mg by mouth every 6 (six) hours as needed for mild pain.    Yes [provider]  atorvastatin (LIPITOR) 20 MG tablet Take 20 mg by mouth daily.   Yes [provider]  eplerenone (INSPRA) 25 MG tablet Take 50 mg by mouth 2 (two) times daily.    Yes [provider]  hydrochlorothiazide (HYDRODIURIL) 25 MG tablet Take 25 mg by mouth daily.   Yes [provider]  Multiple Vitamins-Minerals (MULTI COMPLETE PO) Take by mouth daily.   Yes [provider]    Family History Family History  Problem Relation Age of Onset  .  Hypertension Unknown   . Cancer Brother        Colon  . Cancer Brother        Melanoma    Social History Social History   Tobacco Use  . Smoking status: Never Smoker  . Smokeless tobacco: Never Used  Substance Use Topics  . Alcohol use: Yes    Comment: Occasional  . Drug use: No     Allergies   Diovan [valsartan]   Review of Systems Review of Systems  Constitutional: Negative for appetite change.  HENT: Negative for trouble swallowing and voice change.   Respiratory: Negative for shortness of breath.   Cardiovascular: Positive for chest pain.  Gastrointestinal: Negative for  abdominal pain.  Genitourinary: Negative for flank pain.  Musculoskeletal: Positive for neck pain.  Skin: Negative for rash.  Neurological: Negative for weakness.  Hematological: Negative for adenopathy.  Psychiatric/Behavioral: Negative for confusion.     Physical Exam Updated Vital Signs BP 123/84   Pulse 66   Temp 98 F (36.7 C) (Oral)   Resp 15   Ht 5\' 7"  (1.702 m)   Wt 78.5 kg (173 lb)   SpO2 96%   BMI 27.10 kg/m   Physical Exam  Constitutional: He appears well-developed.  HENT:  Head: Normocephalic.  Neck:  Some swollen lymph nodes bilaterally in the submandibular area.  Cardiovascular: Normal rate, regular rhythm and normal pulses.  No murmur heard. Pulmonary/Chest: Effort normal and breath sounds normal.  Abdominal: Soft.  Musculoskeletal:       Right lower leg: He exhibits no edema.       Left lower leg: He exhibits no edema.  Neurological: He is alert.  Skin: Skin is warm. Capillary refill takes less than 2 seconds.     ED Treatments / Results  Labs (all labs ordered are listed, but only abnormal results are displayed) Labs Reviewed  CBC WITH DIFFERENTIAL/PLATELET - Abnormal; Notable for the following components:      Result Value   WBC 10.8 (*)    All other components within normal limits  BASIC METABOLIC PANEL - Abnormal; Notable for the following components:   Potassium 3.0 (*)    Chloride 97 (*)    Glucose, Bld 130 (*)    All other components within normal limits  TROPONIN I - Abnormal; Notable for the following components:   Troponin I 0.20 (*)    All other components within normal limits  PROTIME-INR  APTT  HEPARIN LEVEL (UNFRACTIONATED)    EKG  EKG Interpretation  Date/Time:  Tuesday January 11 2017 07:51:49 EST Ventricular Rate:  78 PR Interval:    QRS Duration: 88 QT Interval:  412 QTC Calculation: 470 R Axis:   -55 Text Interpretation:  Sinus rhythm Abnormal R-wave progression, early transition Inferior infarct, old ) ST  depression anteriorly and in II. new since prior. Reconfirmed by Davonna Belling [phone]) on 01/11/2017 8:12:55 AM       Radiology Dg Chest 2 View  Result Date: 01/11/2017 CLINICAL DATA:  Chest pain. EXAM: CHEST  2 VIEW COMPARISON:  01/12/2001 report . FINDINGS: Mediastinum and hilar structures normal. Heart size normal. No focal infiltrate. No pleural effusion or pneumothorax. Degenerative changes thoracic spine. IMPRESSION: No acute cardiopulmonary disease. Electronically Signed   By: Marcello Moores  Register   On: 01/11/2017 08:54    Procedures Procedures (including critical care time)  Medications Ordered in ED Medications  heparin bolus via infusion 4,000 Units (4,000 Units Intravenous Bolus from Bag 01/11/17 0926)  Followed by  heparin ADULT infusion 100 units/mL (25000 units/228mL sodium chloride 0.45%) (1,050 Units/hr Intravenous New Bag/Given 01/11/17 0927)  0.9 %  sodium chloride infusion (75 mL/hr Intravenous New Bag/Given 01/11/17 1117)  nitroGLYCERIN 50 mg in dextrose 5 % 250 mL (0.2 mg/mL) infusion (not administered)  nitroGLYCERIN (NITROSTAT) SL tablet 0.4 mg (0.4 mg Sublingual Given 01/11/17 0816)  aspirin chewable tablet 324 mg (324 mg Oral Given 01/11/17 0815)  potassium chloride SA (K-DUR,KLOR-CON) CR tablet 60 mEq (60 mEq Oral Given 01/11/17 1006)     Initial Impression / Assessment and Plan / ED Course  I have reviewed the triage vital signs and the nursing notes.  Pertinent labs & imaging results that were available during my care of the patient were reviewed by me and considered in my medical decision making (see chart for details).     Patient presents with chest pain.  Has had episodes over the last 2 weeks but had pain that lasted longer and went to chest and arm today.  Had previously mostly been in the jaw and then into the chest and over.  Has some EKG changes but not STEMI criteria.  Troponin elevated.  Pain-free after nitroglycerin although his pain has  been decreasing before.  Discussed with [name] who will admit the patient down to [facility].  Also discussed gust with the [facility] physician, who thought that the physicians at [facility] could do the admission.  CRITICAL CARE Performed by: Davonna Belling Total critical care time: 30 minutes Critical care time was exclusive of separately billable procedures and treating other patients. Critical care was necessary to treat or prevent imminent or life-threatening deterioration. Critical care was time spent personally by me on the following activities: development of treatment plan with patient and/or surrogate as well as nursing, discussions with consultants, evaluation of patient's response to treatment, examination of patient, obtaining history from patient or surrogate, ordering and performing treatments and interventions, ordering and review of laboratory studies, ordering and review of radiographic studies, pulse oximetry and re-evaluation of patient's condition.     Final Clinical Impressions(s) / ED Diagnoses   Final diagnoses:  Non-STEMI (non-ST elevated myocardial infarction) [facility])    ED Discharge Orders    None       Davonna Belling, MD 01/11/17 [phone]  Patient had return of chest pain.  Walk to bathroom and had more pain.  Will start nitroglycerin drip.  I discussed with [name].  States that with return of the pain patient did benefit from the cath that is planned for today.  However still waiting for bed.  Suggested discussing with [facility] to hope to expedite transport.    Davonna Belling, MD 01/11/17 1322

## 2017-01-11 NOTE — ED Notes (Signed)
Pt complaining of mild jaw pain, up to bathroom, states pain got more intense and radiating in chest 3/10, EDP aware, orders given.

## 2017-01-11 NOTE — ED Notes (Signed)
CRITICAL VALUE ALERT  Critical Value: trop 0.20  Date & Time Notied:  0832  Provider Notified: Alvino Chapel

## 2017-01-11 NOTE — Progress Notes (Signed)
[place] for Heparin Indication: chest pain/ACS  Allergies  Allergen Reactions  . Diovan [Valsartan]     unknown    Patient Measurements: Height: 5\' 7"  (170.2 cm) Weight: 173 lb (78.5 kg) IBW/kg (Calculated) : 66.1 HEPARIN DW (KG): 78.5   Vital Signs: Temp: 98 F (36.7 C) (12/18 0800) Temp Source: Oral (12/18 0800) BP: 132/82 (12/18 1720) Pulse Rate: 73 (12/18 1725)  Labs: Recent Labs    01/11/17 0749 01/11/17 0800  HGB 16.0  --   HCT 46.2  --   PLT 293  --   APTT  --  33  LABPROT  --  12.5  INR  --  0.94  CREATININE 0.95  --   TROPONINI 0.20*  --     Estimated Creatinine Clearance: 62.8 mL/min (by C-G formula based on SCr of 0.95 mg/dL).   Medical History: Past Medical History:  Diagnosis Date  . Arthritis   . Essential hypertension, benign   . Gynecomastia   . Hyperaldosteronism   . Spinal stenosis   . Temporomandibular joint disease   . Wears glasses    Medications:  Medications Prior to Admission  Medication Sig Dispense Refill Last Dose  . acetaminophen (TYLENOL) 500 MG tablet Take 500 mg by mouth every 6 (six) hours as needed for mild pain.    01/11/2017 at 0615  . atorvastatin (LIPITOR) 20 MG tablet Take 20 mg by mouth daily.   01/10/2017 at Unknown time  . eplerenone (INSPRA) 25 MG tablet Take 50 mg by mouth 2 (two) times daily.    01/11/2017 at 0615  . hydrochlorothiazide (HYDRODIURIL) 25 MG tablet Take 25 mg by mouth daily.   01/11/2017 at 0615  . Multiple Vitamins-Minerals (MULTI COMPLETE PO) Take by mouth daily.   01/11/2017 at 0615    Assessment: 76 year old male s/p cath this evening found to have multivessel CAD. Orders to resume heparin tonight and planning OHS in am.   Goal of Therapy:  Heparin level 0.3-0.7 units/ml   Plan:  Restart heparin at 1000/hr at 11pm tonight Heparin level with am labs prior to surgery  Erin Hearing PharmD., BCPS Clinical Pharmacist Pager [phone] 01/11/2017  6:49 PM

## 2017-01-11 NOTE — ED Notes (Signed)
[facility] here for transport.  

## 2017-01-11 NOTE — ED Notes (Signed)
MD at the bedside  

## 2017-01-12 ENCOUNTER — Other Ambulatory Visit: Payer: Self-pay

## 2017-01-12 ENCOUNTER — Inpatient Hospital Stay (HOSPITAL_COMMUNITY): Payer: PPO

## 2017-01-12 ENCOUNTER — Inpatient Hospital Stay (HOSPITAL_COMMUNITY): Payer: PPO | Admitting: Anesthesiology

## 2017-01-12 ENCOUNTER — Inpatient Hospital Stay (HOSPITAL_COMMUNITY)
Admission: EM | Disposition: A | Discharge status: Home / Self Care | Payer: Self-pay | Source: Home / Self Care | Attending: Cardiothoracic Surgery

## 2017-01-12 ENCOUNTER — Encounter (HOSPITAL_COMMUNITY): Payer: Self-pay | Admitting: Interventional Cardiology

## 2017-01-12 DIAGNOSIS — I2510 Atherosclerotic heart disease of native coronary artery without angina pectoris: Secondary | ICD-10-CM | POA: Diagnosis present

## 2017-01-12 HISTORY — PX: CORONARY ARTERY BYPASS GRAFT: SHX141

## 2017-01-12 HISTORY — PX: TEE WITHOUT CARDIOVERSION: SHX5443

## 2017-01-12 LAB — CBC
HCT: 41.9 % (ref 39.0–52.0)
HCT: 32 % — ABNORMAL LOW (ref 39.0–52.0)
HEMATOCRIT: 34.3 % — AB (ref 39.0–52.0)
HEMOGLOBIN: 12 g/dL — AB (ref 13.0–17.0)
Hemoglobin: 14.6 g/dL (ref 13.0–17.0)
Hemoglobin: 11.1 g/dL — ABNORMAL LOW (ref 13.0–17.0)
MCH: 30.5 pg (ref 26.0–34.0)
MCH: 30.5 pg (ref 26.0–34.0)
MCH: 30.2 pg (ref 26.0–34.0)
MCHC: 34.8 g/dL (ref 30.0–36.0)
MCHC: 35 g/dL (ref 30.0–36.0)
MCHC: 34.7 g/dL (ref 30.0–36.0)
MCV: 87.5 fL (ref 78.0–100.0)
MCV: 87.1 fL (ref 78.0–100.0)
MCV: 87 fL (ref 78.0–100.0)
Platelets: 242 10*3/uL (ref 150–400)
Platelets: 185 10*3/uL (ref 150–400)
Platelets: 206 10*3/uL (ref 150–400)
RBC: 4.79 MIL/uL (ref 4.22–5.81)
RBC: 3.94 MIL/uL — ABNORMAL LOW (ref 4.22–5.81)
RBC: 3.68 MIL/uL — ABNORMAL LOW (ref 4.22–5.81)
RDW: 12.5 % (ref 11.5–15.5)
RDW: 12.6 % (ref 11.5–15.5)
RDW: 12.6 % (ref 11.5–15.5)
WBC: 11.5 10*3/uL — ABNORMAL HIGH (ref 4.0–10.5)
WBC: 20.9 10*3/uL — ABNORMAL HIGH (ref 4.0–10.5)
WBC: 19.6 10*3/uL — ABNORMAL HIGH (ref 4.0–10.5)

## 2017-01-12 LAB — POCT I-STAT 3, ART BLOOD GAS (G3+)
ACID-BASE DEFICIT: 3 mmol/L — AB (ref 0.0–2.0)
ACID-BASE EXCESS: 1 mmol/L (ref 0.0–2.0)
Acid-Base Excess: 2 mmol/L (ref 0.0–2.0)
Acid-Base Excess: 2 mmol/L (ref 0.0–2.0)
Acid-base deficit: 3 mmol/L — ABNORMAL HIGH (ref 0.0–2.0)
Acid-base deficit: 2 mmol/L (ref 0.0–2.0)
BICARBONATE: 28.1 mmol/L — AB (ref 20.0–28.0)
BICARBONATE: 22.5 mmol/L (ref 20.0–28.0)
BICARBONATE: 22.6 mmol/L (ref 20.0–28.0)
Bicarbonate: 26.1 mmol/L (ref 20.0–28.0)
Bicarbonate: 25 mmol/L (ref 20.0–28.0)
Bicarbonate: 20.9 mmol/L (ref 20.0–28.0)
O2 SAT: 100 %
O2 SAT: 100 %
O2 SAT: 100 %
O2 Saturation: 100 %
O2 Saturation: 99 %
O2 Saturation: 100 %
PCO2 ART: 46.8 mmHg (ref 32.0–48.0)
PCO2 ART: 36.7 mmHg (ref 32.0–48.0)
PCO2 ART: 35.9 mmHg (ref 32.0–48.0)
PCO2 ART: 43.1 mmHg (ref 32.0–48.0)
PCO2 ART: 39.2 mmHg (ref 32.0–48.0)
PH ART: 7.386 (ref 7.350–7.450)
PH ART: 7.326 — AB (ref 7.350–7.450)
PO2 ART: 156 mmHg — AB (ref 83.0–108.0)
PO2 ART: 173 mmHg — AB (ref 83.0–108.0)
Patient temperature: 35.7
Patient temperature: 37
Patient temperature: 37.3
TCO2: 29 mmol/L (ref 22–32)
TCO2: 27 mmol/L (ref 22–32)
TCO2: 26 mmol/L (ref 22–32)
TCO2: 22 mmol/L (ref 22–32)
TCO2: 24 mmol/L (ref 22–32)
TCO2: 24 mmol/L (ref 22–32)
pCO2 arterial: 29.9 mmHg — ABNORMAL LOW (ref 32.0–48.0)
pH, Arterial: 7.46 — ABNORMAL HIGH (ref 7.350–7.450)
pH, Arterial: 7.452 — ABNORMAL HIGH (ref 7.350–7.450)
pH, Arterial: 7.448 (ref 7.350–7.450)
pH, Arterial: 7.371 (ref 7.350–7.450)
pO2, Arterial: 468 mmHg — ABNORMAL HIGH (ref 83.0–108.0)
pO2, Arterial: 406 mmHg — ABNORMAL HIGH (ref 83.0–108.0)
pO2, Arterial: 307 mmHg — ABNORMAL HIGH (ref 83.0–108.0)
pO2, Arterial: 127 mmHg — ABNORMAL HIGH (ref 83.0–108.0)

## 2017-01-12 LAB — POCT I-STAT, CHEM 8
BUN: 12 mg/dL (ref 6–20)
BUN: 11 mg/dL (ref 6–20)
BUN: 12 mg/dL (ref 6–20)
BUN: 12 mg/dL (ref 6–20)
BUN: 11 mg/dL (ref 6–20)
BUN: 12 mg/dL (ref 6–20)
CALCIUM ION: 0.93 mmol/L — AB (ref 1.15–1.40)
CALCIUM ION: 1.03 mmol/L — AB (ref 1.15–1.40)
CALCIUM ION: 1.12 mmol/L — AB (ref 1.15–1.40)
CHLORIDE: 100 mmol/L — AB (ref 101–111)
CHLORIDE: 91 mmol/L — AB (ref 101–111)
CHLORIDE: 99 mmol/L — AB (ref 101–111)
CHLORIDE: 95 mmol/L — AB (ref 101–111)
CHLORIDE: 103 mmol/L (ref 101–111)
CREATININE: 0.5 mg/dL — AB (ref 0.61–1.24)
CREATININE: 0.6 mg/dL — AB (ref 0.61–1.24)
CREATININE: 0.8 mg/dL (ref 0.61–1.24)
Calcium, Ion: 1.15 mmol/L (ref 1.15–1.40)
Calcium, Ion: 1.13 mmol/L — ABNORMAL LOW (ref 1.15–1.40)
Calcium, Ion: 1.13 mmol/L — ABNORMAL LOW (ref 1.15–1.40)
Chloride: 97 mmol/L — ABNORMAL LOW (ref 101–111)
Creatinine, Ser: 0.7 mg/dL (ref 0.61–1.24)
Creatinine, Ser: 0.7 mg/dL (ref 0.61–1.24)
Creatinine, Ser: 0.6 mg/dL — ABNORMAL LOW (ref 0.61–1.24)
GLUCOSE: 100 mg/dL — AB (ref 65–99)
GLUCOSE: 116 mg/dL — AB (ref 65–99)
GLUCOSE: 130 mg/dL — AB (ref 65–99)
GLUCOSE: 115 mg/dL — AB (ref 65–99)
Glucose, Bld: 136 mg/dL — ABNORMAL HIGH (ref 65–99)
Glucose, Bld: 135 mg/dL — ABNORMAL HIGH (ref 65–99)
HCT: 38 % — ABNORMAL LOW (ref 39.0–52.0)
HCT: 27 % — ABNORMAL LOW (ref 39.0–52.0)
HCT: 36 % — ABNORMAL LOW (ref 39.0–52.0)
HCT: 30 % — ABNORMAL LOW (ref 39.0–52.0)
HCT: 30 % — ABNORMAL LOW (ref 39.0–52.0)
HCT: 30 % — ABNORMAL LOW (ref 39.0–52.0)
Hemoglobin: 12.9 g/dL — ABNORMAL LOW (ref 13.0–17.0)
Hemoglobin: 12.2 g/dL — ABNORMAL LOW (ref 13.0–17.0)
Hemoglobin: 9.2 g/dL — ABNORMAL LOW (ref 13.0–17.0)
Hemoglobin: 10.2 g/dL — ABNORMAL LOW (ref 13.0–17.0)
Hemoglobin: 10.2 g/dL — ABNORMAL LOW (ref 13.0–17.0)
Hemoglobin: 10.2 g/dL — ABNORMAL LOW (ref 13.0–17.0)
POTASSIUM: 3.3 mmol/L — AB (ref 3.5–5.1)
POTASSIUM: 3.2 mmol/L — AB (ref 3.5–5.1)
POTASSIUM: 3.7 mmol/L (ref 3.5–5.1)
Potassium: 3 mmol/L — ABNORMAL LOW (ref 3.5–5.1)
Potassium: 3.6 mmol/L (ref 3.5–5.1)
Potassium: 3 mmol/L — ABNORMAL LOW (ref 3.5–5.1)
SODIUM: 138 mmol/L (ref 135–145)
SODIUM: 136 mmol/L (ref 135–145)
Sodium: 132 mmol/L — ABNORMAL LOW (ref 135–145)
Sodium: 139 mmol/L (ref 135–145)
Sodium: 134 mmol/L — ABNORMAL LOW (ref 135–145)
Sodium: 139 mmol/L (ref 135–145)
TCO2: 30 mmol/L (ref 22–32)
TCO2: 28 mmol/L (ref 22–32)
TCO2: 32 mmol/L (ref 22–32)
TCO2: 30 mmol/L (ref 22–32)
TCO2: 27 mmol/L (ref 22–32)
TCO2: 24 mmol/L (ref 22–32)

## 2017-01-12 LAB — POCT I-STAT 4, (NA,K, GLUC, HGB,HCT)
GLUCOSE: 141 mg/dL — AB (ref 65–99)
HEMATOCRIT: 34 % — AB (ref 39.0–52.0)
Hemoglobin: 11.6 g/dL — ABNORMAL LOW (ref 13.0–17.0)
Potassium: 3.5 mmol/L (ref 3.5–5.1)
SODIUM: 140 mmol/L (ref 135–145)

## 2017-01-12 LAB — GLUCOSE, CAPILLARY
GLUCOSE-CAPILLARY: 113 mg/dL — AB (ref 65–99)
GLUCOSE-CAPILLARY: 129 mg/dL — AB (ref 65–99)
GLUCOSE-CAPILLARY: 116 mg/dL — AB (ref 65–99)
Glucose-Capillary: 115 mg/dL — ABNORMAL HIGH (ref 65–99)
Glucose-Capillary: 134 mg/dL — ABNORMAL HIGH (ref 65–99)
Glucose-Capillary: 135 mg/dL — ABNORMAL HIGH (ref 65–99)
Glucose-Capillary: 98 mg/dL (ref 65–99)

## 2017-01-12 LAB — SURGICAL PCR SCREEN
MRSA, PCR: NEGATIVE
Staphylococcus aureus: NEGATIVE

## 2017-01-12 LAB — HEPARIN LEVEL (UNFRACTIONATED): HEPARIN UNFRACTIONATED: 0.18 [IU]/mL — AB (ref 0.30–0.70)

## 2017-01-12 LAB — BASIC METABOLIC PANEL
Anion gap: 9 (ref 5–15)
BUN: 12 mg/dL (ref 6–20)
CO2: 24 mmol/L (ref 22–32)
Calcium: 8.7 mg/dL — ABNORMAL LOW (ref 8.9–10.3)
Chloride: 102 mmol/L (ref 101–111)
Creatinine, Ser: 0.91 mg/dL (ref 0.61–1.24)
GFR calc Af Amer: >60 mL/min (ref >60)
GFR calc non Af Amer: >60 mL/min (ref >60)
Glucose, Bld: 105 mg/dL — ABNORMAL HIGH (ref 65–99)
Potassium: 3.3 mmol/L — ABNORMAL LOW (ref 3.5–5.1)
Sodium: 135 mmol/L (ref 135–145)

## 2017-01-12 LAB — LIPID PANEL
CHOL/HDL RATIO: 3.5 ratio
CHOLESTEROL: 137 mg/dL (ref 0–200)
HDL: 39 mg/dL — ABNORMAL LOW (ref >40)
LDL Cholesterol: 81 mg/dL (ref 0–99)
TRIGLYCERIDES: 84 mg/dL (ref <150)
VLDL: 17 mg/dL (ref 0–40)

## 2017-01-12 LAB — HEMOGLOBIN A1C
Hgb A1c MFr Bld: 6.2 % — ABNORMAL HIGH (ref 4.8–5.6)
Mean Plasma Glucose: 131.24 mg/dL

## 2017-01-12 LAB — TYPE AND SCREEN
ABO/RH(D): A POS
Antibody Screen: NEGATIVE

## 2017-01-12 LAB — CREATININE, SERUM
Creatinine, Ser: 0.96 mg/dL (ref 0.61–1.24)
GFR calc Af Amer: >60 mL/min (ref >60)
GFR calc non Af Amer: >60 mL/min (ref >60)

## 2017-01-12 LAB — APTT: aPTT: 34 seconds (ref 24–36)

## 2017-01-12 LAB — MAGNESIUM: Magnesium: 2.9 mg/dL — ABNORMAL HIGH (ref 1.7–2.4)

## 2017-01-12 LAB — HEMOGLOBIN AND HEMATOCRIT, BLOOD
HCT: 31.5 % — ABNORMAL LOW (ref 39.0–52.0)
Hemoglobin: 11 g/dL — ABNORMAL LOW (ref 13.0–17.0)

## 2017-01-12 LAB — PLATELET COUNT: Platelets: 190 10*3/uL (ref 150–400)

## 2017-01-12 LAB — ABO/RH: ABO/RH(D): A POS

## 2017-01-12 LAB — PROTIME-INR
INR: 1.44
PROTHROMBIN TIME: 17.4 s — AB (ref 11.4–15.2)

## 2017-01-12 LAB — TROPONIN I: Troponin I: 8.84 ng/mL (ref <0.03)

## 2017-01-12 SURGERY — CORONARY ARTERY BYPASS GRAFTING (CABG)
Anesthesia: General | Site: Chest

## 2017-01-12 MED ORDER — ACETAMINOPHEN 650 MG RE SUPP: 650.0000 mg | Freq: Once | RECTAL | Status: AC

## 2017-01-12 MED ORDER — SODIUM CHLORIDE 0.45 % IV SOLN: INTRAVENOUS | Status: DC | PRN

## 2017-01-12 MED ORDER — 0.9 % SODIUM CHLORIDE (POUR BTL) OPTIME
TOPICAL | Status: DC | PRN
  Administered 2017-01-12: 1000 mL

## 2017-01-12 MED ORDER — MIDAZOLAM HCL 5 MG/5ML IJ SOLN
INTRAMUSCULAR | Status: DC | PRN
Start: 2017-01-12 — End: 2017-01-12
  Administered 2017-01-12: 2 mg via INTRAVENOUS
  Administered 2017-01-12: 1 mg via INTRAVENOUS
  Administered 2017-01-12: 2 mg via INTRAVENOUS
  Administered 2017-01-12: 1 mg via INTRAVENOUS
  Administered 2017-01-12: 2 mg via INTRAVENOUS

## 2017-01-12 MED ORDER — MIDAZOLAM HCL 10 MG/2ML IJ SOLN
INTRAMUSCULAR | Status: AC
  Filled 2017-01-12: qty 2

## 2017-01-12 MED ORDER — FENTANYL CITRATE (PF) 250 MCG/5ML IJ SOLN
INTRAMUSCULAR | Status: AC
  Filled 2017-01-12: qty 25

## 2017-01-12 MED ORDER — TRAMADOL HCL 50 MG PO TABS: 50.0000 mg | ORAL_TABLET | ORAL | Status: DC | PRN

## 2017-01-12 MED ORDER — HEMOSTATIC AGENTS (NO CHARGE) OPTIME
TOPICAL | Status: DC | PRN
  Administered 2017-01-12: 1 via TOPICAL

## 2017-01-12 MED ORDER — CHLORHEXIDINE GLUCONATE CLOTH 2 % EX PADS: 6.0000 | MEDICATED_PAD | Freq: Every day | CUTANEOUS | Status: DC

## 2017-01-12 MED ORDER — POTASSIUM CHLORIDE 10 MEQ/50ML IV SOLN
10.0000 meq | INTRAVENOUS | Status: DC
  Administered 2017-01-12: 10 meq via INTRAVENOUS
  Filled 2017-01-12 (×4): qty 50

## 2017-01-12 MED ORDER — SODIUM CHLORIDE 0.9 % IV SOLN: 250.0000 mL | INTRAVENOUS | Status: DC

## 2017-01-12 MED ORDER — INSULIN ASPART 100 UNIT/ML ~~LOC~~ SOLN
0.0000 [IU] | SUBCUTANEOUS | Status: DC
  Administered 2017-01-13 (×4): 2 [IU] via SUBCUTANEOUS

## 2017-01-12 MED ORDER — MORPHINE SULFATE (PF) 4 MG/ML IV SOLN
2.0000 mg | INTRAVENOUS | Status: DC | PRN
  Administered 2017-01-13: 2 mg via INTRAVENOUS
  Administered 2017-01-13: 4 mg via INTRAVENOUS
  Administered 2017-01-13: 2 mg via INTRAVENOUS
  Filled 2017-01-12 (×3): qty 1

## 2017-01-12 MED ORDER — METOPROLOL TARTRATE 25 MG/10 ML ORAL SUSPENSION: 12.5000 mg | Freq: Two times a day (BID) | ORAL | Status: DC

## 2017-01-12 MED ORDER — HEPARIN SODIUM (PORCINE) 1000 UNIT/ML IJ SOLN
INTRAMUSCULAR | Status: AC
  Filled 2017-01-12: qty 1

## 2017-01-12 MED ORDER — ONDANSETRON HCL 4 MG/2ML IJ SOLN: 4.0000 mg | Freq: Four times a day (QID) | INTRAMUSCULAR | Status: DC | PRN

## 2017-01-12 MED ORDER — AMIODARONE HCL IN DEXTROSE 360-4.14 MG/200ML-% IV SOLN
INTRAVENOUS | Status: DC | PRN
  Administered 2017-01-12: 14:00:00 via INTRAVENOUS
  Administered 2017-01-12: 60 mg/h via INTRAVENOUS

## 2017-01-12 MED ORDER — PHENYLEPHRINE 40 MCG/ML (10ML) SYRINGE FOR IV PUSH (FOR BLOOD PRESSURE SUPPORT)
PREFILLED_SYRINGE | INTRAVENOUS | Status: DC | PRN
  Administered 2017-01-12: 40 ug via INTRAVENOUS

## 2017-01-12 MED ORDER — FENTANYL CITRATE (PF) 250 MCG/5ML IJ SOLN
INTRAMUSCULAR | Status: AC
  Filled 2017-01-12: qty 5

## 2017-01-12 MED ORDER — PROTAMINE SULFATE 10 MG/ML IV SOLN
INTRAVENOUS | Status: AC
  Filled 2017-01-12: qty 5

## 2017-01-12 MED ORDER — LACTATED RINGERS IV SOLN
INTRAVENOUS | Status: DC | PRN
Start: 2017-01-12 — End: 2017-01-12
  Administered 2017-01-12 (×2): via INTRAVENOUS

## 2017-01-12 MED ORDER — CALCIUM CHLORIDE 10 % IV SOLN
INTRAVENOUS | Status: AC
  Filled 2017-01-12: qty 10

## 2017-01-12 MED ORDER — POTASSIUM CHLORIDE 10 MEQ/50ML IV SOLN
10.0000 meq | INTRAVENOUS | Status: AC
  Administered 2017-01-12 (×3): 10 meq via INTRAVENOUS

## 2017-01-12 MED ORDER — ALBUMIN HUMAN 5 % IV SOLN
250.0000 mL | INTRAVENOUS | Status: DC | PRN
  Administered 2017-01-12 (×3): 250 mL via INTRAVENOUS
  Filled 2017-01-12 (×2): qty 250

## 2017-01-12 MED ORDER — METOPROLOL TARTRATE 5 MG/5ML IV SOLN: 2.5000 mg | INTRAVENOUS | Status: DC | PRN

## 2017-01-12 MED ORDER — INSULIN REGULAR BOLUS VIA INFUSION
0.0000 [IU] | Freq: Three times a day (TID) | INTRAVENOUS | Status: DC
  Filled 2017-01-12: qty 10

## 2017-01-12 MED ORDER — SODIUM CHLORIDE 0.9 % IV SOLN
INTRAVENOUS | Status: DC
  Filled 2017-01-12: qty 1

## 2017-01-12 MED ORDER — BISACODYL 10 MG RE SUPP: 10.0000 mg | Freq: Every day | RECTAL | Status: DC

## 2017-01-12 MED ORDER — VANCOMYCIN HCL IN DEXTROSE 1-5 GM/200ML-% IV SOLN
1000.0000 mg | Freq: Once | INTRAVENOUS | Status: AC
  Administered 2017-01-12: 1000 mg via INTRAVENOUS
  Filled 2017-01-12: qty 200

## 2017-01-12 MED ORDER — SODIUM CHLORIDE 0.9% FLUSH
10.0000 mL | Freq: Two times a day (BID) | INTRAVENOUS | Status: DC
  Administered 2017-01-12 – 2017-01-13 (×3): 10 mL

## 2017-01-12 MED ORDER — POTASSIUM CHLORIDE 10 MEQ/50ML IV SOLN
10.0000 meq | INTRAVENOUS | Status: AC
  Administered 2017-01-12 – 2017-01-13 (×3): 10 meq via INTRAVENOUS
  Filled 2017-01-12 (×3): qty 50

## 2017-01-12 MED ORDER — AMIODARONE HCL IN DEXTROSE 360-4.14 MG/200ML-% IV SOLN: 30.0000 mg/h | INTRAVENOUS | Status: DC

## 2017-01-12 MED ORDER — OXYCODONE HCL 5 MG PO TABS: 5.0000 mg | ORAL_TABLET | ORAL | Status: DC | PRN

## 2017-01-12 MED ORDER — MILRINONE LACTATE IN DEXTROSE 20-5 MG/100ML-% IV SOLN
0.3750 ug/kg/min | INTRAVENOUS | Status: AC
  Administered 2017-01-12: .3 ug/kg/min via INTRAVENOUS
  Filled 2017-01-12: qty 100

## 2017-01-12 MED ORDER — LACTATED RINGERS IV SOLN: INTRAVENOUS | Status: DC

## 2017-01-12 MED ORDER — NITROGLYCERIN IN D5W 200-5 MCG/ML-% IV SOLN
0.0000 ug/min | INTRAVENOUS | Status: DC
Start: 2017-01-12 — End: 2017-01-14

## 2017-01-12 MED ORDER — SODIUM CHLORIDE 0.9 % IV SOLN
0.0000 ug/min | INTRAVENOUS | Status: DC
  Administered 2017-01-13: 10 ug/min via INTRAVENOUS
  Filled 2017-01-12: qty 2

## 2017-01-12 MED ORDER — PROTAMINE SULFATE 10 MG/ML IV SOLN
INTRAVENOUS | Status: AC
  Filled 2017-01-12: qty 25

## 2017-01-12 MED ORDER — FENTANYL CITRATE (PF) 250 MCG/5ML IJ SOLN
INTRAMUSCULAR | Status: DC | PRN
  Administered 2017-01-12: 100 ug via INTRAVENOUS
  Administered 2017-01-12: 150 ug via INTRAVENOUS
  Administered 2017-01-12: 100 ug via INTRAVENOUS
  Administered 2017-01-12: 150 ug via INTRAVENOUS
  Administered 2017-01-12: 50 ug via INTRAVENOUS
  Administered 2017-01-12 (×4): 100 ug via INTRAVENOUS
  Administered 2017-01-12 (×2): 150 ug via INTRAVENOUS

## 2017-01-12 MED ORDER — FAMOTIDINE IN NACL 20-0.9 MG/50ML-% IV SOLN
20.0000 mg | Freq: Two times a day (BID) | INTRAVENOUS | Status: DC
  Administered 2017-01-12: 20 mg via INTRAVENOUS

## 2017-01-12 MED ORDER — SODIUM CHLORIDE 0.9 % IJ SOLN
OROMUCOSAL | Status: DC | PRN
  Administered 2017-01-12: 1 mL via TOPICAL

## 2017-01-12 MED ORDER — PROPOFOL 10 MG/ML IV BOLUS
INTRAVENOUS | Status: AC
  Filled 2017-01-12: qty 20

## 2017-01-12 MED ORDER — ALBUMIN HUMAN 5 % IV SOLN
INTRAVENOUS | Status: DC | PRN
  Administered 2017-01-12 (×2): via INTRAVENOUS

## 2017-01-12 MED ORDER — DEXTROSE 5 % IV SOLN
INTRAVENOUS | Status: DC | PRN
  Administered 2017-01-12: 750 mg via INTRAVENOUS

## 2017-01-12 MED ORDER — ARTIFICIAL TEARS OPHTHALMIC OINT
TOPICAL_OINTMENT | OPHTHALMIC | Status: DC | PRN
  Administered 2017-01-12: 1 via OPHTHALMIC

## 2017-01-12 MED ORDER — CALCIUM CHLORIDE 10 % IV SOLN
INTRAVENOUS | Status: DC | PRN
  Administered 2017-01-12: 400 mg via INTRAVENOUS

## 2017-01-12 MED ORDER — DEXTROSE 5 % IV SOLN
1.5000 g | Freq: Two times a day (BID) | INTRAVENOUS | Status: AC
  Administered 2017-01-12 – 2017-01-14 (×4): 1.5 g via INTRAVENOUS
  Filled 2017-01-12 (×4): qty 1.5

## 2017-01-12 MED ORDER — MILRINONE LACTATE IN DEXTROSE 20-5 MG/100ML-% IV SOLN: 0.3000 ug/kg/min | INTRAVENOUS | Status: DC

## 2017-01-12 MED ORDER — ORAL CARE MOUTH RINSE
15.0000 mL | Freq: Two times a day (BID) | OROMUCOSAL | Status: DC
  Administered 2017-01-12 – 2017-01-13 (×2): 15 mL via OROMUCOSAL

## 2017-01-12 MED ORDER — ACETAMINOPHEN 160 MG/5ML PO SOLN
650.0000 mg | Freq: Once | ORAL | Status: AC
  Administered 2017-01-12: 650 mg

## 2017-01-12 MED ORDER — AMIODARONE HCL IN DEXTROSE 360-4.14 MG/200ML-% IV SOLN: 60.0000 mg/h | INTRAVENOUS | Status: AC

## 2017-01-12 MED ORDER — SODIUM CHLORIDE 0.9% FLUSH: 3.0000 mL | INTRAVENOUS | Status: DC | PRN

## 2017-01-12 MED ORDER — CHLORHEXIDINE GLUCONATE 0.12 % MT SOLN
15.0000 mL | OROMUCOSAL | Status: AC
  Administered 2017-01-12: 15 mL via OROMUCOSAL

## 2017-01-12 MED ORDER — ASPIRIN 81 MG PO CHEW: 324.0000 mg | CHEWABLE_TABLET | Freq: Every day | ORAL | Status: DC

## 2017-01-12 MED ORDER — ARTIFICIAL TEARS OPHTHALMIC OINT
TOPICAL_OINTMENT | OPHTHALMIC | Status: AC
  Filled 2017-01-12: qty 3.5

## 2017-01-12 MED ORDER — ORAL CARE MOUTH RINSE: 15.0000 mL | Freq: Four times a day (QID) | OROMUCOSAL | Status: DC

## 2017-01-12 MED ORDER — SODIUM CHLORIDE 0.9% FLUSH: 10.0000 mL | INTRAVENOUS | Status: DC | PRN

## 2017-01-12 MED ORDER — PROTAMINE SULFATE 10 MG/ML IV SOLN
INTRAVENOUS | Status: DC | PRN
  Administered 2017-01-12: 270 mg via INTRAVENOUS

## 2017-01-12 MED ORDER — SODIUM CHLORIDE 0.9% FLUSH
3.0000 mL | Freq: Two times a day (BID) | INTRAVENOUS | Status: DC
  Administered 2017-01-13: 3 mL via INTRAVENOUS

## 2017-01-12 MED ORDER — METOPROLOL TARTRATE 12.5 MG HALF TABLET
12.5000 mg | ORAL_TABLET | Freq: Two times a day (BID) | ORAL | Status: DC
  Administered 2017-01-13 (×2): 12.5 mg via ORAL
  Filled 2017-01-12 (×2): qty 1

## 2017-01-12 MED ORDER — ACETAMINOPHEN 160 MG/5ML PO SOLN
1000.0000 mg | Freq: Four times a day (QID) | ORAL | Status: DC
Start: 2017-01-13 — End: 2017-01-14

## 2017-01-12 MED ORDER — ACETAMINOPHEN 500 MG PO TABS
1000.0000 mg | ORAL_TABLET | Freq: Four times a day (QID) | ORAL | Status: DC
  Administered 2017-01-12 – 2017-01-14 (×6): 1000 mg via ORAL
  Filled 2017-01-12 (×6): qty 2

## 2017-01-12 MED ORDER — AMIODARONE HCL IN DEXTROSE 360-4.14 MG/200ML-% IV SOLN
60.0000 mg/h | INTRAVENOUS | Status: DC
  Filled 2017-01-12: qty 200

## 2017-01-12 MED ORDER — AMIODARONE IV BOLUS ONLY 150 MG/100ML
INTRAVENOUS | Status: DC | PRN
  Administered 2017-01-12: 150 mg via INTRAVENOUS

## 2017-01-12 MED ORDER — MORPHINE SULFATE (PF) 4 MG/ML IV SOLN: 1.0000 mg | INTRAVENOUS | Status: DC | PRN

## 2017-01-12 MED ORDER — PROPOFOL 10 MG/ML IV BOLUS
INTRAVENOUS | Status: DC | PRN
  Administered 2017-01-12: 70 mg via INTRAVENOUS

## 2017-01-12 MED ORDER — LACTATED RINGERS IV SOLN
INTRAVENOUS | Status: DC
  Administered 2017-01-13: 04:00:00 via INTRAVENOUS

## 2017-01-12 MED ORDER — MAGNESIUM SULFATE 4 GM/100ML IV SOLN
4.0000 g | Freq: Once | INTRAVENOUS | Status: AC
  Administered 2017-01-12: 4 g via INTRAVENOUS
  Filled 2017-01-12: qty 100

## 2017-01-12 MED ORDER — ROCURONIUM BROMIDE 10 MG/ML (PF) SYRINGE
PREFILLED_SYRINGE | INTRAVENOUS | Status: DC | PRN
  Administered 2017-01-12 (×3): 50 mg via INTRAVENOUS

## 2017-01-12 MED ORDER — LACTATED RINGERS IV SOLN: 500.0000 mL | Freq: Once | INTRAVENOUS | Status: DC | PRN

## 2017-01-12 MED ORDER — LACTATED RINGERS IV SOLN
INTRAVENOUS | Status: DC | PRN
  Administered 2017-01-12: 08:00:00 via INTRAVENOUS

## 2017-01-12 MED ORDER — DOCUSATE SODIUM 100 MG PO CAPS
200.0000 mg | ORAL_CAPSULE | Freq: Every day | ORAL | Status: DC
  Administered 2017-01-13: 200 mg via ORAL
  Filled 2017-01-12: qty 2

## 2017-01-12 MED ORDER — HEPARIN SODIUM (PORCINE) 1000 UNIT/ML IJ SOLN
INTRAMUSCULAR | Status: DC | PRN
  Administered 2017-01-12: 27000 [IU] via INTRAVENOUS

## 2017-01-12 MED ORDER — PHENYLEPHRINE 40 MCG/ML (10ML) SYRINGE FOR IV PUSH (FOR BLOOD PRESSURE SUPPORT)
PREFILLED_SYRINGE | INTRAVENOUS | Status: AC
  Filled 2017-01-12: qty 10

## 2017-01-12 MED ORDER — BISACODYL 5 MG PO TBEC
10.0000 mg | DELAYED_RELEASE_TABLET | Freq: Every day | ORAL | Status: DC
  Administered 2017-01-13: 10 mg via ORAL
  Filled 2017-01-12: qty 2

## 2017-01-12 MED ORDER — SODIUM CHLORIDE 0.9 % IV SOLN
0.0000 ug/kg/h | INTRAVENOUS | Status: DC
  Filled 2017-01-12: qty 2

## 2017-01-12 MED ORDER — ASPIRIN EC 325 MG PO TBEC
325.0000 mg | DELAYED_RELEASE_TABLET | Freq: Every day | ORAL | Status: DC
  Administered 2017-01-13: 325 mg via ORAL
  Filled 2017-01-12: qty 1

## 2017-01-12 MED ORDER — MIDAZOLAM HCL 2 MG/2ML IJ SOLN: 2.0000 mg | INTRAMUSCULAR | Status: DC | PRN

## 2017-01-12 MED ORDER — CHLORHEXIDINE GLUCONATE CLOTH 2 % EX PADS
6.0000 | MEDICATED_PAD | Freq: Every day | CUTANEOUS | Status: DC
  Administered 2017-01-13 – 2017-01-15 (×2): 6 via TOPICAL

## 2017-01-12 MED ORDER — ROCURONIUM BROMIDE 10 MG/ML (PF) SYRINGE
PREFILLED_SYRINGE | INTRAVENOUS | Status: AC
  Filled 2017-01-12: qty 5

## 2017-01-12 MED ORDER — SODIUM CHLORIDE 0.9 % IV SOLN: INTRAVENOUS | Status: DC

## 2017-01-12 MED ORDER — PANTOPRAZOLE SODIUM 40 MG PO TBEC: 40.0000 mg | DELAYED_RELEASE_TABLET | Freq: Every day | ORAL | Status: DC

## 2017-01-12 MED ORDER — CHLORHEXIDINE GLUCONATE 0.12% ORAL RINSE (MEDLINE KIT): 15.0000 mL | Freq: Two times a day (BID) | OROMUCOSAL | Status: DC

## 2017-01-12 MED ORDER — PHENYLEPHRINE HCL 10 MG/ML IJ SOLN: INTRAVENOUS | Status: DC | PRN

## 2017-01-12 SURGICAL SUPPLY — 77 items
ADH SKN CLS APL DERMABOND .7 (GAUZE/BANDAGES/DRESSINGS) ×2
BAG DECANTER FOR FLEXI CONT (MISCELLANEOUS) ×3 IMPLANT
BANDAGE ACE 4X5 VEL STRL LF (GAUZE/BANDAGES/DRESSINGS) ×3 IMPLANT
BANDAGE ACE 6X5 VEL STRL LF (GAUZE/BANDAGES/DRESSINGS) ×3 IMPLANT
BLADE STERNUM SYSTEM 6 (BLADE) ×3 IMPLANT
BLADE SURG 11 STRL SS (BLADE) ×1 IMPLANT
BNDG GAUZE ELAST 4 BULKY (GAUZE/BANDAGES/DRESSINGS) ×3 IMPLANT
CANISTER SUCT 3000ML PPV (MISCELLANEOUS) ×3 IMPLANT
CATH CPB KIT GERHARDT (MISCELLANEOUS) ×3 IMPLANT
CATH THORACIC 28FR (CATHETERS) ×3 IMPLANT
CLIP VESOCCLUDE SM WIDE 24/CT (CLIP) ×1 IMPLANT
CRADLE DONUT ADULT HEAD (MISCELLANEOUS) ×3 IMPLANT
DERMABOND ADVANCED (GAUZE/BANDAGES/DRESSINGS) ×1
DERMABOND ADVANCED .7 DNX12 (GAUZE/BANDAGES/DRESSINGS) ×1 IMPLANT
DRAIN CHANNEL 28F RND 3/8 FF (WOUND CARE) ×3 IMPLANT
DRAPE CARDIOVASCULAR INCISE (DRAPES) ×3
DRAPE SLUSH/WARMER DISC (DRAPES) ×3 IMPLANT
DRAPE SRG 135X102X78XABS (DRAPES) ×2 IMPLANT
DRSG AQUACEL AG ADV 3.5X14 (GAUZE/BANDAGES/DRESSINGS) ×3 IMPLANT
ELECT BLADE 4.0 EZ CLEAN MEGAD (MISCELLANEOUS) ×3
ELECT REM PT RETURN 9FT ADLT (ELECTROSURGICAL) ×6
ELECTRODE BLDE 4.0 EZ CLN MEGD (MISCELLANEOUS) ×2 IMPLANT
ELECTRODE REM PT RTRN 9FT ADLT (ELECTROSURGICAL) ×4 IMPLANT
FELT TEFLON 1X6 (MISCELLANEOUS) ×6 IMPLANT
FLOSEAL 10ML (HEMOSTASIS) ×2 IMPLANT
GAUZE SPONGE 4X4 12PLY STRL (GAUZE/BANDAGES/DRESSINGS) ×6 IMPLANT
GAUZE SPONGE 4X4 12PLY STRL LF (GAUZE/BANDAGES/DRESSINGS) ×2 IMPLANT
GLOVE BIO SURGEON STRL SZ 6.5 (GLOVE) ×9 IMPLANT
GLOVE BIOGEL M 6.5 STRL (GLOVE) ×6 IMPLANT
GLOVE BIOGEL PI IND STRL 6 (GLOVE) IMPLANT
GLOVE BIOGEL PI IND STRL 6.5 (GLOVE) IMPLANT
GLOVE BIOGEL PI IND STRL 7.0 (GLOVE) IMPLANT
GLOVE BIOGEL PI IND STRL 7.5 (GLOVE) ×2 IMPLANT
GLOVE BIOGEL PI INDICATOR 6 (GLOVE) ×1
GLOVE BIOGEL PI INDICATOR 6.5 (GLOVE) ×3
GLOVE BIOGEL PI INDICATOR 7.0 (GLOVE) ×4
GLOVE BIOGEL PI INDICATOR 7.5 (GLOVE) ×1
GOWN STRL REUS W/ TWL LRG LVL3 (GOWN DISPOSABLE) ×8 IMPLANT
GOWN STRL REUS W/TWL LRG LVL3 (GOWN DISPOSABLE) ×12
HEMOSTAT POWDER SURGIFOAM 1G (HEMOSTASIS) ×9 IMPLANT
HEMOSTAT SURGICEL 2X14 (HEMOSTASIS) ×3 IMPLANT
KIT BASIN OR (CUSTOM PROCEDURE TRAY) ×3 IMPLANT
KIT CATH SUCT 8FR (CATHETERS) ×3 IMPLANT
KIT ROOM TURNOVER OR (KITS) ×3 IMPLANT
KIT SUCTION CATH 14FR (SUCTIONS) ×6 IMPLANT
KIT VASOVIEW HEMOPRO VH 3000 (KITS) ×3 IMPLANT
LEAD PACING MYOCARDI (MISCELLANEOUS) ×3 IMPLANT
MARKER GRAFT CORONARY BYPASS (MISCELLANEOUS) ×9 IMPLANT
NS IRRIG 1000ML POUR BTL (IV SOLUTION) ×15 IMPLANT
PACK E OPEN HEART (SUTURE) ×3 IMPLANT
PACK OPEN HEART (CUSTOM PROCEDURE TRAY) ×3 IMPLANT
PAD ARMBOARD 7.5X6 YLW CONV (MISCELLANEOUS) ×6 IMPLANT
PAD ELECT DEFIB RADIOL ZOLL (MISCELLANEOUS) ×3 IMPLANT
PENCIL BUTTON HOLSTER BLD 10FT (ELECTRODE) ×3 IMPLANT
PUNCH AORTIC ROTATE  4.5MM 8IN (MISCELLANEOUS) ×1 IMPLANT
SET CARDIOPLEGIA MPS 5001102 (MISCELLANEOUS) ×1 IMPLANT
SPONGE LAP 18X18 X RAY DECT (DISPOSABLE) ×4 IMPLANT
SUT BONE WAX W31G (SUTURE) ×3 IMPLANT
SUT MNCRL AB 4-0 PS2 18 (SUTURE) ×1 IMPLANT
SUT PROLENE 3 0 SH1 36 (SUTURE) ×3 IMPLANT
SUT PROLENE 4 0 TF (SUTURE) ×6 IMPLANT
SUT PROLENE 6 0 CC (SUTURE) ×6 IMPLANT
SUT PROLENE 7 0 BV1 MDA (SUTURE) ×5 IMPLANT
SUT PROLENE 8 0 BV175 6 (SUTURE) ×12 IMPLANT
SUT STEEL 6MS V (SUTURE) ×3 IMPLANT
SUT STEEL SZ 6 DBL 3X14 BALL (SUTURE) ×3 IMPLANT
SUT VIC AB 1 CTX 18 (SUTURE) ×6 IMPLANT
SUT VIC AB 2-0 CT1 27 (SUTURE) ×6
SUT VIC AB 2-0 CT1 TAPERPNT 27 (SUTURE) IMPLANT
SYSTEM SAHARA CHEST DRAIN ATS (WOUND CARE) ×3 IMPLANT
TAPE CLOTH SURG 4X10 WHT LF (GAUZE/BANDAGES/DRESSINGS) ×2 IMPLANT
TOWEL GREEN STERILE (TOWEL DISPOSABLE) ×3 IMPLANT
TOWEL GREEN STERILE FF (TOWEL DISPOSABLE) ×3 IMPLANT
TRAY FOLEY SILVER 16FR TEMP (SET/KITS/TRAYS/PACK) ×3 IMPLANT
TUBING INSUFFLATION (TUBING) ×3 IMPLANT
UNDERPAD 30X30 (UNDERPADS AND DIAPERS) ×3 IMPLANT
WATER STERILE IRR 1000ML POUR (IV SOLUTION) ×6 IMPLANT

## 2017-01-12 NOTE — Brief Op Note (Signed)
01/11/2017 - 01/12/2017  2:31 PM  PATIENT:  Christopher Booth  76 y.o. male  PRE-OPERATIVE DIAGNOSIS:  1. S/p NSTEMI  Less then 24 hours preop 2.CAD with left main disease   POST-OPERATIVE DIAGNOSIS:  Same   PROCEDURE:  TRANSESOPHAGEAL ECHOCARDIOGRAM (TEE), MEDIAN STERNOTOMY for CORONARY ARTERY BYPASS GRAFTING (CABG) x 3 (LIMA to LAD, SVG to OM, SVG to RCA) with EVH from [place] and LEFT INTERNAL MAMMARY ARTERY  SURGEON:  Surgeon(s) and Role:    Grace Isaac, MD - Primary  PHYSICIAN ASSISTANT: Lars Pinks PA-C  ASSISTANTS: Ara Kussmaul RNFA  ANESTHESIA:   general  EBL:  150 mL   DRAINS: Chest tubes placed in the mediastinal and pleural spaces   LOCAL MEDICATIONS USED:  NONE  COUNTS CORRECT:  YES  DICTATION: .Dragon Dictation  PLAN OF CARE: Admit to inpatient   PATIENT DISPOSITION:  ICU - intubated and hemodynamically stable.   Delay start of Pharmacological VTE agent (>24hrs) due to surgical blood loss or risk of bleeding: yes  BASELINE WEIGHT: 76.3 kg

## 2017-01-12 NOTE — Progress Notes (Signed)
Patient ID: Christopher Booth, male   DOB: 1940/06/26, 76 y.o.   MRN: [id number] EVENING ROUNDS NOTE :     [address]       [place] [address]             [phone]                 Day of Surgery Procedure(s) (LRB): CORONARY ARTERY BYPASS GRAFTING (CABG) (N/A) TRANSESOPHAGEAL ECHOCARDIOGRAM (TEE) (N/A)  Total Length of Stay:  LOS: 1 day  BP 94/77   Pulse 89   Temp (!) 96.4 F (35.8 C)   Resp 12   Ht 5\' 7"  (1.702 m)   Wt 168 lb 3.4 oz (76.3 kg)   SpO2 100%   BMI 26.35 kg/m   .Intake/Output      12/18 0701 - 12/19 0700 12/19 0701 - 12/20 0700   P.O. 0    I.V. (mL/kg) 640 (8.4) 2509.4 (32.9)   Blood  300   IV Piggyback  1350   Total Intake(mL/kg) 640 (8.4) 4159.4 (54.5)   Urine (mL/kg/hr) 225 2345 (2.7)   Emesis/NG output  0   Blood  150   Chest Tube  110   Total Output 225 2605   Net +415 +1554.4          . sodium chloride 20 mL/hr at 01/12/17 1500  . [START ON 01/13/2017] sodium chloride    . sodium chloride 20 mL/hr at 01/12/17 1500  . albumin human    . amiodarone 60 mg/hr (01/12/17 1500)   Followed by  . amiodarone    . cefUROXime (ZINACEF)  IV    . dexmedetomidine (PRECEDEX) IV infusion Stopped (01/12/17 1740)  . famotidine (PEPCID) IV Stopped (01/12/17 1515)  . insulin (NOVOLIN-R) infusion 1.7 Units/hr (01/12/17 1800)  . lactated ringers    . lactated ringers    . lactated ringers 20 mL/hr at 01/12/17 1500  . magnesium sulfate 4 g (01/12/17 1445)  . milrinone 0.3 mcg/kg/min (01/12/17 1500)  . nitroGLYCERIN    . phenylephrine (NEO-SYNEPHRINE) Adult infusion 12 mcg/min (01/12/17 1620)  . potassium chloride 10 mEq (01/12/17 1718)  . vancomycin       Lab Results  Component Value Date   WBC 20.9 (H) 01/12/2017   HGB 11.6 (L) 01/12/2017   HCT 34.0 (L) 01/12/2017   PLT 185 01/12/2017   GLUCOSE 141 (H) 01/12/2017   CHOL 137 01/12/2017   TRIG 84 01/12/2017   HDL 39 (L) 01/12/2017   LDLCALC 81 01/12/2017   NA 140 01/12/2017   K 3.5  01/12/2017   CL 97 (L) 01/12/2017   CREATININE 0.60 (L) 01/12/2017   BUN 11 01/12/2017   CO2 24 01/12/2017   INR 1.44 01/12/2017   HGBA1C 6.2 (H) 01/12/2017   Still on vent, not bleeding  Wean vent as tolerated     Grace Isaac MD  Beeper [phone] Office [phone] 01/12/2017 6:14 PM

## 2017-01-12 NOTE — Anesthesia Procedure Notes (Signed)
Central Venous Catheter Insertion Performed by: Roberts Gaudy, MD, anesthesiologist Start/End12/19/2018 8:00 AM, 01/12/2017 8:10 AM Preanesthetic checklist: patient identified, IV checked, site marked, risks and benefits discussed, surgical consent, monitors and equipment checked, pre-op evaluation and timeout performed Position: supine Lidocaine 1% used for infiltration Hand hygiene performed , maximum sterile barriers used  and Seldinger technique used Catheter size: 8.5 Fr PA cath was placed.Sheath introducer Procedure performed using ultrasound guided technique. Ultrasound Notes:anatomy identified, needle tip was noted to be adjacent to the nerve/plexus identified, no ultrasound evidence of intravascular and/or intraneural injection and image(s) printed for medical record Attempts: 1 Following insertion, line sutured, dressing applied and Biopatch. Post procedure assessment: blood return through all ports  Patient tolerated the procedure well with no immediate complications.

## 2017-01-12 NOTE — Progress Notes (Signed)
CRITICAL VALUE ALERT  Critical Value:  Troponin 9.85  Date & Time Notied: 01/11/17 2324  Provider Notified: MD Beatriz Stallion via text page  Orders Received/Actions taken: No orders received, pt recently had heart cath & not having active chest pain/symptoms. Will continue to monitor.

## 2017-01-12 NOTE — Anesthesia Procedure Notes (Signed)
Arterial Line Insertion Start/End12/19/2018 7:45 AM Performed by: Candis Shine, CRNA, CRNA  Patient location: Pre-op. Preanesthetic checklist: patient identified, IV checked, site marked, risks and benefits discussed, surgical consent, monitors and equipment checked, pre-op evaluation, timeout performed and anesthesia consent Lidocaine 1% used for infiltration Left, radial was placed Catheter size: 20 G Hand hygiene performed  and maximum sterile barriers used   Attempts: 1 Procedure performed without using ultrasound guided technique. Following insertion, dressing applied and Biopatch. Post procedure assessment: normal and unchanged  Patient tolerated the procedure well with no immediate complications.

## 2017-01-12 NOTE — Progress Notes (Signed)
      [address]       [place] [address]             [phone]    Pre Procedure note for inpatients:   Christopher Booth has been scheduled for Procedure(s): LEFT HEART CATH AND CORONARY ANGIOGRAPHY (N/A) today. The various methods of treatment have been discussed with the patient. After consideration of the risks, benefits and treatment options the patient has consented to the planned procedure.   The patient has been seen and labs reviewed. There are no changes in the patient's condition to prevent proceeding with the planned procedure today.  Recent labs:  Lab Results  Component Value Date   WBC 11.5 (H) 01/12/2017   HGB 14.6 01/12/2017   HCT 41.9 01/12/2017   PLT 242 01/12/2017   GLUCOSE 105 (H) 01/12/2017   CHOL 137 01/12/2017   TRIG 84 01/12/2017   HDL 39 (L) 01/12/2017   LDLCALC 81 01/12/2017   NA 135 01/12/2017   K 3.3 (L) 01/12/2017   CL 102 01/12/2017   CREATININE 0.91 01/12/2017   BUN 12 01/12/2017   CO2 24 01/12/2017   INR 0.94 01/11/2017   HGBA1C 6.2 (H) 01/12/2017    Grace Isaac, MD 01/12/2017 6:48 AM

## 2017-01-12 NOTE — Anesthesia Procedure Notes (Signed)
Central Venous Catheter Insertion Performed by: Roberts Gaudy, MD, anesthesiologist Start/End12/19/2018 7:50 AM, 01/12/2017 7:55 AM Patient location: Pre-op. Preanesthetic checklist: patient identified, IV checked, site marked, risks and benefits discussed, surgical consent, monitors and equipment checked, pre-op evaluation and timeout performed Position: supine Hand hygiene performed , maximum sterile barriers used  and Seldinger technique used Catheter size: 8 Fr Central line was placed.Double lumen Procedure performed using ultrasound guided technique. Ultrasound Notes:anatomy identified, needle tip was noted to be adjacent to the nerve/plexus identified and image(s) printed for medical record Attempts: 1 Following insertion, line sutured, dressing applied and Biopatch. Post procedure assessment: blood return through all ports, free fluid flow and no air  Patient tolerated the procedure well with no immediate complications.

## 2017-01-12 NOTE — Progress Notes (Signed)
  Amiodarone Drug - Drug Interaction Consult Note  Recommendations: Monitor vital signs and for additional drug-drug interactions.   Amiodarone is metabolized by the cytochrome P450 system and therefore has the potential to cause many drug interactions. Amiodarone has an average plasma half-life of 50 days (range 20 to 100 days).   There is potential for drug interactions to occur several weeks or months after stopping treatment and the onset of drug interactions may be slow after initiating amiodarone.   [x]  Statins: Increased risk of myopathy. Simvastatin- restrict dose to 20mg  daily. Other statins: counsel patients to report any muscle pain or weakness immediately.  []  Anticoagulants: Amiodarone can increase anticoagulant effect. Consider warfarin dose reduction. Patients should be monitored closely and the dose of anticoagulant altered accordingly, remembering that amiodarone levels take several weeks to stabilize.  []  Antiepileptics: Amiodarone can increase plasma concentration of phenytoin, the dose should be reduced. Note that small changes in phenytoin dose can result in large changes in levels. Monitor patient and counsel on signs of toxicity.  [x]  Beta blockers: increased risk of bradycardia, AV block and myocardial depression. Sotalol - avoid concomitant use.  []   Calcium channel blockers (diltiazem and verapamil): increased risk of bradycardia, AV block and myocardial depression.  []   Cyclosporine: Amiodarone increases levels of cyclosporine. Reduced dose of cyclosporine is recommended.  []  Digoxin dose should be halved when amiodarone is started.  []  Diuretics: increased risk of cardiotoxicity if hypokalemia occurs.  []  Oral hypoglycemic agents (glyburide, glipizide, glimepiride): increased risk of hypoglycemia. Patient's glucose levels should be monitored closely when initiating amiodarone therapy.   []  Drugs that prolong the QT interval:  Torsades de pointes risk may be  increased with concurrent use - avoid if possible.  Monitor QTc, also keep magnesium/potassium WNL if concurrent therapy can't be avoided. Marland Kitchen Antibiotics: e.g. fluoroquinolones, erythromycin. . Antiarrhythmics: e.g. quinidine, procainamide, disopyramide, sotalol. . Antipsychotics: e.g. phenothiazines, haloperidol.  . Lithium, tricyclic antidepressants, and methadone.  Thank You,  Doylene Canard, PharmD Clinical Pharmacist  Pager: [phone] Phone: [phone]

## 2017-01-12 NOTE — Anesthesia Procedure Notes (Signed)
Central Venous Catheter Insertion Performed by: Roberts Gaudy, MD, anesthesiologist Start/End12/19/2018 7:00 AM, 01/12/2017 7:10 AM Preanesthetic checklist: patient identified, IV checked, site marked, risks and benefits discussed, surgical consent, monitors and equipment checked, pre-op evaluation and timeout performed Position: supine Lidocaine 1% used for infiltration Hand hygiene performed , maximum sterile barriers used  and Seldinger technique used Catheter size: 8.5 Fr Sheath introducer Procedure performed using ultrasound guided technique. Attempts: 1 Following insertion, line sutured and dressing applied. Post procedure assessment: blood return through all ports, free fluid flow and no air

## 2017-01-12 NOTE — Anesthesia Procedure Notes (Signed)
Procedure Name: Intubation Date/Time: 01/12/2017 8:50 AM Performed by: Candis Shine, CRNA Pre-anesthesia Checklist: Patient identified, Emergency Drugs available, Suction available and Patient being monitored Patient Re-evaluated:Patient Re-evaluated prior to induction Oxygen Delivery Method: Circle System Utilized Preoxygenation: Pre-oxygenation with 100% oxygen Induction Type: IV induction Ventilation: Mask ventilation without difficulty and Oral airway inserted - appropriate to patient size Laryngoscope Size: Sabra Heck and 2 Grade View: Grade II Tube type: Oral Tube size: 8.0 mm Number of attempts: 2 Airway Equipment and Method: Stylet and Oral airway Placement Confirmation: ETT inserted through vocal cords under direct vision,  positive ETCO2 and breath sounds checked- equal and bilateral Secured at: 23 cm Tube secured with: Tape Dental Injury: Teeth and Oropharynx as per pre-operative assessment  Comments: DL x 1 by CRNA with Mac 3, grade 3 view. DL x 1 by MDA with Miller 2, grade 2 view. Successful atraumatic oral intubation.

## 2017-01-12 NOTE — Transfer of Care (Signed)
Immediate Anesthesia Transfer of Care Note  Patient: Christopher Booth  Procedure(s) Performed: CORONARY ARTERY BYPASS GRAFTING (CABG) (N/A Chest) TRANSESOPHAGEAL ECHOCARDIOGRAM (TEE) (N/A )  Patient Location: ICU  Anesthesia Type:General  Level of Consciousness: sedated and Patient remains intubated per anesthesia plan  Airway & Oxygen Therapy: Patient remains intubated per anesthesia plan and Patient placed on Ventilator (see vital sign flow sheet for setting)  Post-op Assessment: Report given to RN and Post -op Vital signs reviewed and stable  Post vital signs: Reviewed and stable  Last Vitals:  Vitals:   01/12/17 0804 01/12/17 0805  BP:    Pulse: 72 71  Resp: 15 17  Temp:    SpO2: 99% 99%    Last Pain:  Vitals:   01/12/17 0403  TempSrc: Oral  PainSc:       Patients Stated Pain Goal: 0 (50/53/97 6734)  Complications: No apparent anesthesia complications

## 2017-01-12 NOTE — Anesthesia Preprocedure Evaluation (Signed)
Anesthesia Evaluation  Patient identified by MRN, date of birth, ID band Patient awake    Reviewed: Allergy & Precautions, NPO status , Patient's Chart, lab work & pertinent test results  Airway Mallampati: II  TM Distance: >3 FB Neck ROM: Full    Dental  (+) Teeth Intact, Dental Advisory Given   Pulmonary    breath sounds clear to auscultation       Cardiovascular hypertension,  Rhythm:Regular Rate:Normal     Neuro/Psych    GI/Hepatic   Endo/Other    Renal/GU      Musculoskeletal   Abdominal   Peds  Hematology   Anesthesia Other Findings   Reproductive/Obstetrics                             Anesthesia Physical Anesthesia Plan  ASA: III  Anesthesia Plan: General   Post-op Pain Management:    Induction: Intravenous  PONV Risk Score and Plan: 1 and Dexamethasone and Ondansetron  Airway Management Planned: Oral ETT  Additional Equipment: 3D TEE, PA Cath, CVP and Arterial line  Intra-op Plan:   Post-operative Plan: Post-operative intubation/ventilation  Informed Consent: I have reviewed the patients History and Physical, chart, labs and discussed the procedure including the risks, benefits and alternatives for the proposed anesthesia with the patient or authorized representative who has indicated his/her understanding and acceptance.   Dental advisory given  Plan Discussed with: CRNA and Anesthesiologist  Anesthesia Plan Comments:         Anesthesia Quick Evaluation

## 2017-01-12 NOTE — Anesthesia Postprocedure Evaluation (Signed)
Anesthesia Post Note  Patient: Christopher Booth  Procedure(s) Performed: CORONARY ARTERY BYPASS GRAFTING (CABG) (N/A Chest) TRANSESOPHAGEAL ECHOCARDIOGRAM (TEE) (N/A )     Patient location during evaluation: SICU Anesthesia Type: General Level of consciousness: awake, awake and alert and oriented Pain management: pain level controlled Vital Signs Assessment: post-procedure vital signs reviewed and stable Respiratory status: spontaneous breathing, nonlabored ventilation and respiratory function stable Cardiovascular status: blood pressure returned to baseline Anesthetic complications: no    Last Vitals:  Vitals:   01/12/17 2000 01/12/17 2015  BP: 98/75 104/78  Pulse: 89 89  Resp: (!) 22 (!) 7  Temp: 37.2 C 37.2 C  SpO2: 100% 100%    Last Pain:  Vitals:   01/12/17 2000  TempSrc: Core  PainSc:                  Christopher Booth,Christopher Booth

## 2017-01-12 NOTE — Progress Notes (Signed)
  Echocardiogram Echocardiogram Transesophageal has been performed.  Christopher Booth 01/12/2017, 9:32 AM

## 2017-01-12 NOTE — Procedures (Signed)
Extubation Procedure Note  Patient Details:   Name: KAHLE MCQUEEN DOB: [birth year] MRN: [id number]   Airway Documentation:  Airway 8 mm (Active)  Secured at (cm) 22 cm 01/12/2017  3:00 PM  Measured From Lips 01/12/2017  3:00 PM  Secured Location Right 01/12/2017  3:00 PM  Secured By Pink Tape 01/12/2017  3:00 PM  Cuff Pressure (cm H2O) 30 cm H2O 01/12/2017  2:55 PM  Site Condition Dry 01/12/2017  2:55 PM    Evaluation  O2 sats: stable throughout Complications: No apparent complications Patient did tolerate procedure well. Bilateral Breath Sounds: Clear, Diminished   Yes  Charyl Dancer 01/12/2017, 7:26 PM   Pt extubated per rapid wean protocol. Pt able to perform VC of 950 mL and a NIF of -20. Pt had positive cuff leak. RT and RN got the okay from MD to extubate. Pt was placed on 4L [place] and doing well. RT will continue to monitor.

## 2017-01-13 ENCOUNTER — Encounter (HOSPITAL_COMMUNITY): Payer: Self-pay | Admitting: Cardiothoracic Surgery

## 2017-01-13 ENCOUNTER — Inpatient Hospital Stay (HOSPITAL_COMMUNITY): Payer: PPO

## 2017-01-13 LAB — CREATININE, SERUM: CREATININE: 1.03 mg/dL (ref 0.61–1.24)

## 2017-01-13 LAB — CBC
HCT: 31.7 % — ABNORMAL LOW (ref 39.0–52.0)
HEMATOCRIT: 31.2 % — AB (ref 39.0–52.0)
HEMOGLOBIN: 10.9 g/dL — AB (ref 13.0–17.0)
Hemoglobin: 11 g/dL — ABNORMAL LOW (ref 13.0–17.0)
MCH: 30.8 pg (ref 26.0–34.0)
MCH: 30.6 pg (ref 26.0–34.0)
MCHC: 34.9 g/dL (ref 30.0–36.0)
MCHC: 34.7 g/dL (ref 30.0–36.0)
MCV: 88.1 fL (ref 78.0–100.0)
MCV: 88.1 fL (ref 78.0–100.0)
PLATELETS: 186 10*3/uL (ref 150–400)
Platelets: 172 10*3/uL (ref 150–400)
RBC: 3.54 MIL/uL — AB (ref 4.22–5.81)
RBC: 3.6 MIL/uL — ABNORMAL LOW (ref 4.22–5.81)
RDW: 12.8 % (ref 11.5–15.5)
RDW: 13 % (ref 11.5–15.5)
WBC: 13.1 10*3/uL — AB (ref 4.0–10.5)
WBC: 17.9 10*3/uL — ABNORMAL HIGH (ref 4.0–10.5)

## 2017-01-13 LAB — GLUCOSE, CAPILLARY
GLUCOSE-CAPILLARY: 121 mg/dL — AB (ref 65–99)
GLUCOSE-CAPILLARY: 126 mg/dL — AB (ref 65–99)
Glucose-Capillary: 106 mg/dL — ABNORMAL HIGH (ref 65–99)
Glucose-Capillary: 143 mg/dL — ABNORMAL HIGH (ref 65–99)
Glucose-Capillary: 171 mg/dL — ABNORMAL HIGH (ref 65–99)

## 2017-01-13 LAB — BASIC METABOLIC PANEL
ANION GAP: 9 (ref 5–15)
BUN: 12 mg/dL (ref 6–20)
CHLORIDE: 106 mmol/L (ref 101–111)
CO2: 21 mmol/L — AB (ref 22–32)
Calcium: 7.8 mg/dL — ABNORMAL LOW (ref 8.9–10.3)
Creatinine, Ser: 0.94 mg/dL (ref 0.61–1.24)
GFR calc non Af Amer: >60 mL/min (ref >60)
Glucose, Bld: 114 mg/dL — ABNORMAL HIGH (ref 65–99)
POTASSIUM: 3.5 mmol/L (ref 3.5–5.1)
Sodium: 136 mmol/L (ref 135–145)

## 2017-01-13 LAB — POCT I-STAT, CHEM 8
BUN: 17 mg/dL (ref 6–20)
CREATININE: 0.9 mg/dL (ref 0.61–1.24)
Calcium, Ion: 1.08 mmol/L — ABNORMAL LOW (ref 1.15–1.40)
Chloride: 101 mmol/L (ref 101–111)
GLUCOSE: 160 mg/dL — AB (ref 65–99)
HEMATOCRIT: 31 % — AB (ref 39.0–52.0)
HEMOGLOBIN: 10.5 g/dL — AB (ref 13.0–17.0)
Potassium: 3.8 mmol/L (ref 3.5–5.1)
Sodium: 138 mmol/L (ref 135–145)
TCO2: 22 mmol/L (ref 22–32)

## 2017-01-13 LAB — MAGNESIUM
MAGNESIUM: 2.3 mg/dL (ref 1.7–2.4)
Magnesium: 2.2 mg/dL (ref 1.7–2.4)

## 2017-01-13 LAB — TSH: TSH: 0.493 u[IU]/mL (ref 0.350–4.500)

## 2017-01-13 MED ORDER — INSULIN ASPART 100 UNIT/ML ~~LOC~~ SOLN
0.0000 [IU] | SUBCUTANEOUS | Status: DC
  Administered 2017-01-13: 4 [IU] via SUBCUTANEOUS
  Administered 2017-01-14 (×2): 2 [IU] via SUBCUTANEOUS

## 2017-01-13 MED ORDER — ENOXAPARIN SODIUM 30 MG/0.3ML ~~LOC~~ SOLN
30.0000 mg | Freq: Every day | SUBCUTANEOUS | Status: DC
  Administered 2017-01-13: 30 mg via SUBCUTANEOUS
  Filled 2017-01-13: qty 0.3

## 2017-01-13 MED ORDER — POTASSIUM CHLORIDE 10 MEQ/50ML IV SOLN
10.0000 meq | INTRAVENOUS | Status: AC
  Administered 2017-01-13 (×3): 10 meq via INTRAVENOUS
  Filled 2017-01-13 (×3): qty 50

## 2017-01-13 MED FILL — Magnesium Sulfate Inj 50%: INTRAMUSCULAR | Qty: 10 | Status: AC

## 2017-01-13 MED FILL — Potassium Chloride Inj 2 mEq/ML: INTRAVENOUS | Qty: 40 | Status: AC

## 2017-01-13 MED FILL — Heparin Sodium (Porcine) Inj 1000 Unit/ML: INTRAMUSCULAR | Qty: 30 | Status: AC

## 2017-01-13 NOTE — CV Procedure (Signed)
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 He had been having intermittent right neck pain for 2 weeks prior to this episode. In the [place], he was noted to have mildly elevated troponins and was transferred to [facility] for cardiac catheterization. This revealed extensive three-vessel coronary artery disease with a very high-grade proximal circumflex lesion. Troponins peaked at 8.84 on the morning of 01/12/2017. He was then scheduled to undergo coronary artery bypass grafting by [name] on 01/12/17. Intraoperative transesophageal echocardiography was indicated to evaluate the right and left ventricular function intraoperatively, to assess for any valvular pathology and to serve as a monitor for intraoperative volume status and intracardiac air.  The patient was brought to the operating room at [facility] and general anesthesia was induced without difficulty. Following endotracheal intubation and orogastric suctioning, the transesophageal echocardiography probe was inserted in the esophagus without difficulty.  Impression: Pre-bypass findings:  1. Aortic valve: The aortic valve was trileaflet. The leaflets were thin and pliable and opened normally without restriction. There was trace to 1+ aortic insufficiency with a central jet. The aortic annulus measured 2.4 cm in diameter.  2. Mitral valve: Mitral leaflets were thin and pliable and opened normally without restriction.There was adequate coaptation of the leaflets without prolapsing or flail segments. There was trace mitral insufficiency with a central jet  3. Left ventricle: There was akinesis of the basilar inferior-lateral wall. There were no other regional wall motion abnormalities the remainder of the left  ventricular segments contracted normally. Ejection fraction was estimated at 45-50% using the Simpson's biplane method. Left ventricular end-diastolic diameter measured 4.0 cm at the mid-papillary level in the transgastric short axis view. There was moderate concentric left ventricular hypertrophy. The inferior wall measured 1.18 cm in anterior wall measured 1.13 cm at end diastole at the mid-papillary level in the transgastric short axis view.  4. Right ventricle: The right ventricular cavity was of normal size and there was normal contractility of the right ventricular free wall and normal appearing right ventricular systolic function.  5. Tricuspid valve: The tricuspid valve leaflets appeared structurally normal. There was trace tricuspid insufficiency.  6. Interatrial septum: Interatrial septum was intact without evidence of patent foramen ovale or atrial septal defect by color Doppler.  7. Left atrium: There was no thrombus noted within left atrium or left atrial appendage.  8. Ascending aorta: The ascending aorta appeared free of atheromatous disease. There was a well-defined aortic root and sinotubular ridge which was mildly dilated. The aortic root diameter at the sinuses of Valsalva was 3.81 cm: The sinotubular junction measured 3.52 cm, and the proximal a Sinding aorta measured 3.60 cm.  9. Descending aorta: The descending aorta showed mild intimal thickening and was of normal diameter and measured 2.55 cm.  Post-bypass findings:  1. Aortic valve: The aortic valve appeared unchanged from the pre-bypass study. There was trace aortic insufficiency.  2. Mitral valve: The mitral valve appeared unchanged from the pre-bypass study. There was trace mitral insufficiency.  3. Left ventricle: There was persistent akinesis of the basilar lateral wall. The remaining left ventricular segments contracted normally. The ejection fraction was estimated at 45-50%.  4. Right ventricle: The right  ventricular function appeared normal and unchanged from the pre-bypass study.  5. Tricuspid valve: There was trace tricuspid insufficiency.  Roberts Gaudy, MD

## 2017-01-13 NOTE — Progress Notes (Signed)
Patient ID: Christopher Booth, male   DOB: 1940/11/29, 76 y.o.   MRN: [id number] TCTS DAILY ICU PROGRESS NOTE                   [address]            [place] [address]          [phone]   1 Day Post-Op Procedure(s) (LRB): CORONARY ARTERY BYPASS GRAFTING (CABG) (N/A) TRANSESOPHAGEAL ECHOCARDIOGRAM (TEE) (N/A)  Total Length of Stay:  LOS: 2 days   Subjective: Patient awake alert neurologically intact extubated without difficulty.  Objective: Vital signs in last 24 hours: Temp:  [95.7 F (35.4 C)-99.5 F (37.5 C)] 99.5 F (37.5 C) (12/20 0600) Pulse Rate:  [61-98] 98 (12/20 0600) Cardiac Rhythm: A-V Sequential paced (12/20 0400) Resp:  [6-25] 21 (12/20 0600) BP: (75-115)/(65-80) 108/73 (12/20 0500) SpO2:  [98 %-100 %] 98 % (12/20 0600) Arterial Line BP: (83-138)/(51-73) 138/63 (12/20 0600) FiO2 (%):  [40 %-50 %] 40 % (12/19 1833) Weight:  [178 lb 2.1 oz (80.8 kg)] 178 lb 2.1 oz (80.8 kg) (12/20 0600)  Filed Weights   01/11/17 2000 01/12/17 0500 01/13/17 0600  Weight: 170 lb 13.7 oz (77.5 kg) 168 lb 3.4 oz (76.3 kg) 178 lb 2.1 oz (80.8 kg)    Weight change: 5 lb 2.1 oz (2.328 kg)   Hemodynamic parameters for last 24 hours: PAP: (12-30)/(1-16) 22/10 CO:  [2.9 L/min-5.4 L/min] 5.4 L/min CI:  [1.5 L/min/m2-2.9 L/min/m2] 2.9 L/min/m2  Intake/Output from previous day: 12/19 0701 - 12/20 0700 In: 5597.8 [I.V.:3197.8; Blood:300; IV Piggyback:2100] Out: 3705 [Urine:3200; Emesis/NG output:25; Blood:150; Chest Tube:330]  Intake/Output this shift: Total I/O In: 1076.4 [I.V.:576.4; IV Piggyback:500] Out: 970 [Urine:770; Chest Tube:200]  Current Meds: Scheduled Meds: . acetaminophen  1,000 mg Oral Q6H   Or  . acetaminophen (TYLENOL) oral liquid 160 mg/5 mL  1,000 mg Per Tube Q6H  . aspirin EC  325 mg Oral Daily   Or  . aspirin  324 mg Per Tube Daily  . atorvastatin  20 mg Oral q1800  . bisacodyl  10 mg Oral Daily   Or  . bisacodyl  10 mg Rectal Daily    . Chlorhexidine Gluconate Cloth  6 each Topical Daily  . docusate sodium  200 mg Oral Daily  . insulin aspart  0-24 Units Subcutaneous Q4H  . mouth rinse  15 mL Mouth Rinse BID  . metoprolol tartrate  12.5 mg Oral BID   Or  . metoprolol tartrate  12.5 mg Per Tube BID  . [START ON 01/14/2017] pantoprazole  40 mg Oral Daily  . sodium chloride flush  10-40 mL Intracatheter Q12H  . sodium chloride flush  3 mL Intravenous Q12H   Continuous Infusions: . sodium chloride 20 mL/hr at 01/13/17 0600  . sodium chloride Stopped (01/13/17 0500)  . sodium chloride Stopped (01/12/17 2100)  . albumin human Stopped (01/12/17 1915)  . amiodarone    . cefUROXime (ZINACEF)  IV Stopped (01/12/17 2030)  . dexmedetomidine (PRECEDEX) IV infusion Stopped (01/12/17 1740)  . famotidine (PEPCID) IV Stopped (01/12/17 1515)  . lactated ringers    . lactated ringers    . lactated ringers 20 mL/hr at 01/13/17 0600  . milrinone Stopped (01/12/17 1900)  . nitroGLYCERIN    . phenylephrine (NEO-SYNEPHRINE) Adult infusion Stopped (01/13/17 0300)  . potassium chloride 10 mEq (01/13/17 0600)   PRN Meds:.sodium chloride, albumin human, lactated ringers, metoprolol tartrate, midazolam, morphine injection, ondansetron (  ZOFRAN) IV, oxyCODONE, sodium chloride flush, sodium chloride flush, traMADol  General appearance: alert, cooperative and no distress Neurologic: intact Heart: regular rate and rhythm, S1, S2 normal, no murmur, click, rub or gallop Lungs: diminished breath sounds bibasilar Abdomen: soft, non-tender; bowel sounds normal; no masses,  no organomegaly Extremities: extremities normal, atraumatic, no cyanosis or edema and Homans sign is negative, no sign of DVT Wound: Dressing intact sternum stable  Lab Results: CBC: Recent Labs    01/12/17 2025 01/12/17 2038 01/13/17 0339  WBC 19.6*  --  13.1*  HGB 11.1* 10.2* 10.9*  HCT 32.0* 30.0* 31.2*  PLT 206  --  172   BMET:  Recent Labs    01/12/17 0148   01/12/17 2038 01/13/17 0339  NA 135   < > 139 136  K 3.3*   < > 3.7 3.5  CL 102   < > 103 106  CO2 24  --   --  21*  GLUCOSE 105*   < > 115* 114*  BUN 12   < > 12 12  CREATININE 0.91   < > 0.80 0.94  CALCIUM 8.7*  --   --  7.8*   < > = values in this interval not displayed.    CMET: Lab Results  Component Value Date   WBC 13.1 (H) 01/13/2017   HGB 10.9 (L) 01/13/2017   HCT 31.2 (L) 01/13/2017   PLT 172 01/13/2017   GLUCOSE 114 (H) 01/13/2017   CHOL 137 01/12/2017   TRIG 84 01/12/2017   HDL 39 (L) 01/12/2017   LDLCALC 81 01/12/2017   NA 136 01/13/2017   K 3.5 01/13/2017   CL 106 01/13/2017   CREATININE 0.94 01/13/2017   BUN 12 01/13/2017   CO2 21 (L) 01/13/2017   TSH 0.493 01/13/2017   INR 1.44 01/12/2017   HGBA1C 6.2 (H) 01/12/2017      PT/INR:  Recent Labs    01/12/17 1439  LABPROT 17.4*  INR 1.44   Radiology: Dg Chest Port 1 View  Result Date: 01/12/2017 CLINICAL DATA:  Status post CABG.  Assess support apparatus. EXAM: PORTABLE CHEST 1 VIEW COMPARISON:  Chest radiograph January 11, 2017 FINDINGS: Interval median sternotomy for CABG. Endotracheal tube tip projects 5.4 cm above the carina. Swan-Ganz catheter via RIGHT internal jugular venous approach with distal tip projecting main pulmonary artery. RIGHT internal jugular central venous catheter distal tip projects in mid superior vena cava. Nasogastric tube side port projecting at GE junction, distal tip out of field-of-view. LEFT chest tube with side port projecting within the chest wall. Mediastinal drain projecting over the spine. Multiple wires overlie the patient. Cardiac silhouette is upper limits of normal in size. Tortuous aorta, mildly widened postoperative mediastinum. No pleural effusion or focal consolidation. No pneumothorax. Mild biapical pleural thickening. Soft tissue planes and included osseous structures are nonsuspicious. IMPRESSION: Well-positioned life-support lines. Borderline cardiomegaly.  No  acute pulmonary process. Electronically Signed   By: Elon Alas M.D.   On: 01/12/2017 15:29     Assessment/Plan: S/P Procedure(s) (LRB): CORONARY ARTERY BYPASS GRAFTING (CABG) (N/A) TRANSESOPHAGEAL ECHOCARDIOGRAM (TEE) (N/A) Mobilize Diuresis Diabetes control d/c tubes/lines See progression orders Expected Acute  Blood - loss Anemia- continue to monitor      Grace Isaac 01/13/2017 6:45 AM

## 2017-01-13 NOTE — Progress Notes (Signed)
TCTS BRIEF SICU PROGRESS NOTE  1 Day Post-Op  S/P Procedure(s) (LRB): CORONARY ARTERY BYPASS GRAFTING (CABG) (N/A) TRANSESOPHAGEAL ECHOCARDIOGRAM (TEE) (N/A)   Stable day NSR w/ stable BP Breathing comfortably on room air UOP marginal but adequate Labs okay  Plan: Continue current plan  Rexene Alberts, MD 01/13/2017 6:58 PM

## 2017-01-13 NOTE — Addendum Note (Signed)
Addendum  created 01/13/17 0609 by Roberts Gaudy, MD   Sign clinical note

## 2017-01-13 NOTE — Care Management Note (Addendum)
Case Management Note  Patient Details  Name: Christopher Booth MRN: [id number] Date of Birth: [birth year]  Subjective/Objective:  From home with spouse, POD 1 CABG.  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  He did ambulate with RN today. He states [name] and [name] lives in [place] and they will be able to assist them at discharge.  He states he is concern for wife who had a stroke and she needs help in getting in tub with shower chair because he is not able to lift the chair.  NCM informed him if need private sitter to go to [employer] to locate this.  Also if she needs [place] she will have to go thru her PCP office.  Patient states he thinks they will be fine because [name] and [name] will be there sometimes, suggested that when they come ,to let wife go ahead and take shower then while they are there.  NCM contacted the [facility]reg[facility], but they are not able to assist with  The needs he has.  NCM also made referral to [place] with [facility], she states patient is on her list to see.   NCM also received a call back from the [place] stating to try the [facility].                  Action/Plan: NCM will follow for dc needs.   Expected Discharge Date:                  Expected Discharge Plan:     In-House Referral:     Discharge planning Services  CM Consult  Post Acute Care Choice:    Choice offered to:     DME Arranged:    DME Agency:     HH Arranged:    HH Agency:     Status of Service:  In process, will continue to follow  If discussed at Long Length of Stay Meetings, dates discussed:    Additional Comments:  Zenon Mayo, RN 01/13/2017, 3:27 PM

## 2017-01-14 ENCOUNTER — Inpatient Hospital Stay (HOSPITAL_COMMUNITY): Payer: PPO

## 2017-01-14 LAB — BASIC METABOLIC PANEL
Anion gap: 5 (ref 5–15)
BUN: 17 mg/dL (ref 6–20)
CO2: 27 mmol/L (ref 22–32)
Calcium: 8 mg/dL — ABNORMAL LOW (ref 8.9–10.3)
Chloride: 102 mmol/L (ref 101–111)
Creatinine, Ser: 0.89 mg/dL (ref 0.61–1.24)
GFR calc Af Amer: >60 mL/min (ref >60)
GFR calc non Af Amer: >60 mL/min (ref >60)
Glucose, Bld: 128 mg/dL — ABNORMAL HIGH (ref 65–99)
Potassium: 3.2 mmol/L — ABNORMAL LOW (ref 3.5–5.1)
Sodium: 134 mmol/L — ABNORMAL LOW (ref 135–145)

## 2017-01-14 LAB — GLUCOSE, CAPILLARY
GLUCOSE-CAPILLARY: 140 mg/dL — AB (ref 65–99)
Glucose-Capillary: 131 mg/dL — ABNORMAL HIGH (ref 65–99)
Glucose-Capillary: 120 mg/dL — ABNORMAL HIGH (ref 65–99)
Glucose-Capillary: 133 mg/dL — ABNORMAL HIGH (ref 65–99)
Glucose-Capillary: 124 mg/dL — ABNORMAL HIGH (ref 65–99)

## 2017-01-14 LAB — CBC
HCT: 31 % — ABNORMAL LOW (ref 39.0–52.0)
Hemoglobin: 10.5 g/dL — ABNORMAL LOW (ref 13.0–17.0)
MCH: 29.7 pg (ref 26.0–34.0)
MCHC: 33.9 g/dL (ref 30.0–36.0)
MCV: 87.8 fL (ref 78.0–100.0)
Platelets: 174 10*3/uL (ref 150–400)
RBC: 3.53 MIL/uL — ABNORMAL LOW (ref 4.22–5.81)
RDW: 12.9 % (ref 11.5–15.5)
WBC: 15.8 10*3/uL — ABNORMAL HIGH (ref 4.0–10.5)

## 2017-01-14 MED ORDER — SODIUM CHLORIDE 0.9 % IV SOLN: 250.0000 mL | INTRAVENOUS | Status: DC | PRN

## 2017-01-14 MED ORDER — SODIUM CHLORIDE 0.9% FLUSH: 3.0000 mL | INTRAVENOUS | Status: DC | PRN

## 2017-01-14 MED ORDER — POTASSIUM CHLORIDE CRYS ER 20 MEQ PO TBCR
20.0000 meq | EXTENDED_RELEASE_TABLET | Freq: Every day | ORAL | Status: DC
  Administered 2017-01-14 – 2017-01-17 (×4): 20 meq via ORAL
  Filled 2017-01-14 (×4): qty 1

## 2017-01-14 MED ORDER — ENOXAPARIN SODIUM 40 MG/0.4ML ~~LOC~~ SOLN
40.0000 mg | Freq: Every day | SUBCUTANEOUS | Status: AC
  Administered 2017-01-14 – 2017-01-15 (×2): 40 mg via SUBCUTANEOUS
  Filled 2017-01-14 (×3): qty 0.4

## 2017-01-14 MED ORDER — SODIUM CHLORIDE 0.9% FLUSH
3.0000 mL | Freq: Two times a day (BID) | INTRAVENOUS | Status: DC
  Administered 2017-01-14 – 2017-01-16 (×5): 3 mL via INTRAVENOUS

## 2017-01-14 MED ORDER — ONDANSETRON HCL 4 MG PO TABS: 4.0000 mg | ORAL_TABLET | Freq: Four times a day (QID) | ORAL | Status: DC | PRN

## 2017-01-14 MED ORDER — DOCUSATE SODIUM 100 MG PO CAPS
200.0000 mg | ORAL_CAPSULE | Freq: Every day | ORAL | Status: DC
  Administered 2017-01-14 – 2017-01-16 (×3): 200 mg via ORAL
  Filled 2017-01-14 (×4): qty 2

## 2017-01-14 MED ORDER — POTASSIUM CHLORIDE 10 MEQ/50ML IV SOLN
10.0000 meq | INTRAVENOUS | Status: DC
  Administered 2017-01-14 (×3): 10 meq via INTRAVENOUS
  Filled 2017-01-14 (×2): qty 50

## 2017-01-14 MED ORDER — ASPIRIN EC 81 MG PO TBEC
81.0000 mg | DELAYED_RELEASE_TABLET | Freq: Every day | ORAL | Status: DC
  Administered 2017-01-14 – 2017-01-17 (×4): 81 mg via ORAL
  Filled 2017-01-14 (×4): qty 1

## 2017-01-14 MED ORDER — TRAMADOL HCL 50 MG PO TABS: 50.0000 mg | ORAL_TABLET | ORAL | Status: DC | PRN

## 2017-01-14 MED ORDER — ACETAMINOPHEN 325 MG PO TABS: 650.0000 mg | ORAL_TABLET | Freq: Four times a day (QID) | ORAL | Status: DC | PRN

## 2017-01-14 MED ORDER — OXYCODONE HCL 5 MG PO TABS: 5.0000 mg | ORAL_TABLET | ORAL | Status: DC | PRN

## 2017-01-14 MED ORDER — METOPROLOL TARTRATE 12.5 MG HALF TABLET
12.5000 mg | ORAL_TABLET | Freq: Two times a day (BID) | ORAL | Status: DC
  Administered 2017-01-14 (×2): 12.5 mg via ORAL
  Filled 2017-01-14 (×2): qty 1

## 2017-01-14 MED ORDER — PANTOPRAZOLE SODIUM 40 MG PO TBEC
40.0000 mg | DELAYED_RELEASE_TABLET | Freq: Every day | ORAL | Status: DC
  Administered 2017-01-14 – 2017-01-17 (×4): 40 mg via ORAL
  Filled 2017-01-14 (×3): qty 1

## 2017-01-14 MED ORDER — BISACODYL 10 MG RE SUPP: 10.0000 mg | Freq: Every day | RECTAL | Status: DC | PRN

## 2017-01-14 MED ORDER — ONDANSETRON HCL 4 MG/2ML IJ SOLN
4.0000 mg | Freq: Four times a day (QID) | INTRAMUSCULAR | Status: DC | PRN
  Administered 2017-01-14: 4 mg via INTRAVENOUS
  Filled 2017-01-14: qty 2

## 2017-01-14 MED ORDER — MOVING RIGHT ALONG BOOK
Freq: Once | Status: AC
  Administered 2017-01-14: 10:00:00
  Filled 2017-01-14: qty 1

## 2017-01-14 MED ORDER — BISACODYL 5 MG PO TBEC
10.0000 mg | DELAYED_RELEASE_TABLET | Freq: Every day | ORAL | Status: DC | PRN
  Filled 2017-01-14: qty 2

## 2017-01-14 MED ORDER — INSULIN ASPART 100 UNIT/ML ~~LOC~~ SOLN
0.0000 [IU] | Freq: Three times a day (TID) | SUBCUTANEOUS | Status: DC
  Administered 2017-01-14: 3 [IU] via SUBCUTANEOUS
  Administered 2017-01-14 – 2017-01-16 (×5): 2 [IU] via SUBCUTANEOUS

## 2017-01-14 MED ORDER — SPIRONOLACTONE 25 MG PO TABS
25.0000 mg | ORAL_TABLET | Freq: Every day | ORAL | Status: DC
Start: 2017-01-14 — End: 2017-01-17
  Administered 2017-01-14 – 2017-01-17 (×4): 25 mg via ORAL
  Filled 2017-01-14 (×4): qty 1

## 2017-01-14 NOTE — Progress Notes (Signed)
Patient ID: Christopher Booth, male   DOB: 1940/04/15, 76 y.o.   MRN: [id number] TCTS DAILY ICU PROGRESS NOTE                   [address]            [place] [address]          [phone]   2 Days Post-Op Procedure(s) (LRB): CORONARY ARTERY BYPASS GRAFTING (CABG) (N/A) TRANSESOPHAGEAL ECHOCARDIOGRAM (TEE) (N/A)  Total Length of Stay:  LOS: 3 days   Subjective: Patient awake alert neurologically intact,   Objective: Vital signs in last 24 hours: Temp:  [98.2 F (36.8 C)-99.7 F (37.6 C)] 98.3 F (36.8 C) (12/21 0741) Pulse Rate:  [79-96] 85 (12/21 0700) Cardiac Rhythm: Normal sinus rhythm (12/21 0000) Resp:  [13-25] 24 (12/21 0700) BP: (89-132)/(61-90) 128/73 (12/21 0700) SpO2:  [93 %-100 %] 95 % (12/21 0700) Arterial Line BP: (103-166)/(48-63) 103/48 (12/20 1700) Weight:  [178 lb 2.1 oz (80.8 kg)] 178 lb 2.1 oz (80.8 kg) (12/21 0600)  Filed Weights   01/12/17 0500 01/13/17 0600 01/14/17 0600  Weight: 168 lb 3.4 oz (76.3 kg) 178 lb 2.1 oz (80.8 kg) 178 lb 2.1 oz (80.8 kg)    Weight change: 0 lb (0 kg)   Hemodynamic parameters for last 24 hours: PAP: (19-21)/(9-10) 21/10 CO:  [6 L/min] 6 L/min CI:  [3.2 L/min/m2] 3.2 L/min/m2  Intake/Output from previous day: 12/20 0701 - 12/21 0700 In: 360 [I.V.:260; IV Piggyback:100] Out: 625 [Urine:565; Chest Tube:60]  Intake/Output this shift: No intake/output data recorded.  Current Meds: Scheduled Meds: . acetaminophen  1,000 mg Oral Q6H   Or  . acetaminophen (TYLENOL) oral liquid 160 mg/5 mL  1,000 mg Per Tube Q6H  . aspirin EC  325 mg Oral Daily   Or  . aspirin  324 mg Per Tube Daily  . atorvastatin  20 mg Oral q1800  . bisacodyl  10 mg Oral Daily   Or  . bisacodyl  10 mg Rectal Daily  . Chlorhexidine Gluconate Cloth  6 each Topical Daily  . docusate sodium  200 mg Oral Daily  . enoxaparin (LOVENOX) injection  30 mg Subcutaneous QHS  . insulin aspart  0-24 Units Subcutaneous Q4H  . metoprolol  tartrate  12.5 mg Oral BID   Or  . metoprolol tartrate  12.5 mg Per Tube BID  . pantoprazole  40 mg Oral Daily  . sodium chloride flush  10-40 mL Intracatheter Q12H  . sodium chloride flush  3 mL Intravenous Q12H   Continuous Infusions: . amiodarone    . cefUROXime (ZINACEF)  IV 1.5 g (01/14/17 0807)  . lactated ringers    . lactated ringers 10 mL/hr at 01/14/17 0500  . milrinone Stopped (01/12/17 1900)  . nitroGLYCERIN    . phenylephrine (NEO-SYNEPHRINE) Adult infusion Stopped (01/13/17 0300)  . potassium chloride 10 mEq (01/14/17 0805)   PRN Meds:.lactated ringers, metoprolol tartrate, midazolam, morphine injection, ondansetron (ZOFRAN) IV, oxyCODONE, sodium chloride flush, sodium chloride flush, traMADol  General appearance: alert, cooperative and no distress Neurologic: intact Heart: regular rate and rhythm, S1, S2 normal, no murmur, click, rub or gallop Lungs: clear to auscultation bilaterally Abdomen: soft, non-tender; bowel sounds normal; no masses,  no organomegaly Extremities: extremities normal, atraumatic, no cyanosis or edema and Homans sign is negative, no sign of DVT Wound: Sternum is stable  Lab Results: CBC: Recent Labs    01/13/17 0339 01/13/17 1557 01/13/17 1610  WBC  13.1* 17.9*  --   HGB 10.9* 11.0* 10.5*  HCT 31.2* 31.7* 31.0*  PLT 172 186  --    BMET:  Recent Labs    01/13/17 0339  01/13/17 1610 01/14/17 0432  NA 136  --  138 134*  K 3.5  --  3.8 3.2*  CL 106  --  101 102  CO2 21*  --   --  27  GLUCOSE 114*  --  160* 128*  BUN 12  --  17 17  CREATININE 0.94   < > 0.90 0.89  CALCIUM 7.8*  --   --  8.0*   < > = values in this interval not displayed.    CMET: Lab Results  Component Value Date   WBC 17.9 (H) 01/13/2017   HGB 10.5 (L) 01/13/2017   HCT 31.0 (L) 01/13/2017   PLT 186 01/13/2017   GLUCOSE 128 (H) 01/14/2017   CHOL 137 01/12/2017   TRIG 84 01/12/2017   HDL 39 (L) 01/12/2017   LDLCALC 81 01/12/2017   NA 134 (L) 01/14/2017     K 3.2 (L) 01/14/2017   CL 102 01/14/2017   CREATININE 0.89 01/14/2017   BUN 17 01/14/2017   CO2 27 01/14/2017   TSH 0.493 01/13/2017   INR 1.44 01/12/2017   HGBA1C 6.2 (H) 01/12/2017      PT/INR:  Recent Labs    01/12/17 1439  LABPROT 17.4*  INR 1.44   Radiology: No results found.   Assessment/Plan: S/P Procedure(s) (LRB): CORONARY ARTERY BYPASS GRAFTING (CABG) (N/A) TRANSESOPHAGEAL ECHOCARDIOGRAM (TEE) (N/A) Mobilize Diuresis Diabetes control d/c tubes/lines Plan for transfer to step-down: see transfer orders Replace potassium    Grace Isaac 01/14/2017 8:07 AM

## 2017-01-14 NOTE — Consult Note (Signed)
   [facility] Inpatient Consult   01/14/2017  Christopher Booth 01/30/1940 [id number]  Call received from inpatient RNCM regarding this [employer] member who has recently had a CABG.  Care Manager's concerns were for the patient's wife who is dependent on the patient for her ADLs and care and they have limited support, no children, lives in [place] and friends live in [place].  [name], wanted information for congregational nurses of [facility].  Phone number provided, [phone].    [name], RN BSN [facility] Liaison  [phone] business mobile phone Toll free office [phone]

## 2017-01-14 NOTE — Discharge Summary (Addendum)
Physician Discharge Summary       [address]       [place] [address]             [phone]    Patient ID: Christopher Booth MRN: [id number] DOB/AGE: August 30, 1940 76 y.o.  Admit date: 01/11/2017 Discharge date: 01/17/2017  Admission Diagnoses: 1. NSTEMI (non-ST elevated myocardial infarction) ([place]) 2. Coronary artery disease  Active Diagnoses:  1. Essential hypertension, benign 2. Arthritis 3. Gynecomastia 4. Hyperaldosteronism 5. Spinal stenosis 6. Temporomandibular joint disease 7. ABL anemia 8. Atrial fibrillation  Procedure (s):  Jettie Booze, MD (Primary)    Procedures   LEFT HEART CATH AND CORONARY ANGIOGRAPHY on 01/11/2017:  Conclusion     Ost Cx to Prox Cx lesion is 90% stenosed.  Mid LM to Dist LM lesion is 50% stenosed.  Prox to mid LAD lesion is 70% stenosed. Eccentric, best seen in the RAO caudal view.  Ost LAD to Prox LAD lesion is 40% stenosed.  Ost RCA to Prox RCA lesion is 80% stenosed.  Dist RCA lesion is 80% stenosed.  The left ventricular ejection fraction is 45-50% by visual estimate.  There is mild left ventricular systolic dysfunction.  LV end diastolic pressure is normal.  There is no aortic valve stenosis.   Multivessel disease with culprit lesion being heavily calcified distal left main lesion extending into the circumflex ostium.     TRANSESOPHAGEAL ECHOCARDIOGRAM (TEE), MEDIAN STERNOTOMY for CORONARY ARTERY BYPASS GRAFTING (CABG) x 3 (LIMA to LAD, SVG to OM, SVG to RCA) with EVH from [place] and LEFT INTERNAL MAMMARY ARTERY by [name] on 01/12/2017.  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  He had mild symptoms similar for the past 2 weeks, but because they were becoming worse this morning he went to the [facility] emergency  room 7:00 this morning.  Is transferred to [facility] was delayed until this afternoon because of bed availability. Cardiac catheterization was performed late this afternoon noon demonstrating significant three-vessel coronary artery disease, with very high-grade proximal circumflex lesion.  The patient is now comfortable without chest pain.  Patient with non-STEMI myocardial infarction, now pain-free with cardiac catheterization showing complex high-grade proximal circumflex lesion in addition left main 50%, 80-85% proximal LAD overall preserved LV function.  With the patient's-new onset of symptoms and high-grade three-vessel coronary artery disease coronary artery bypass grafting is recommended to the patient.  Risks and options were discussed in detail with the patient and his wife. Patient agreed to proceed with surgery.Pre operative carotid duplex US showed no significant internal carotid artery stenosis bilaterally. He underwent a CABG x 3 on 01/12/2017.  Brief Hospital Course:  The patient was extubated the evening of surgery without difficulty. He remained afebrile and hemodynamically stable. He was weaned off of Milrinone, Neo Synephrine, and Amiodarone drips. Gordy Councilman, a line, chest tubes, and foley were removed early in the post operative course. Lopressor was started and titrated accordingly. He was volume over loaded and diuresed. He had ABL anemia. He did not require a post op transfusion. Last H and H was 11.7/34.5. He was weaned off the insulin drip.  The patient's HGA1C pre op was 6.2. 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 In addition, diet recommendations have been included  in his discharge instructions. The patient was felt surgically stable for transfer from the ICU to PCTU for further convalescence on 01/14/2017.  He continued to have very brief episodes of atrial fibrillation.  He was started on oral amiodarone and  Eliquis for this. He was given several boluses of IV Amiodarone to help control the a fib rate. He continues to progress with cardiac rehab. He was ambulating on room air. He has been tolerating a diet and has had a bowel movement. Epicardial pacing wires were removed on 01/14/2017. Chest tube sutures will be removed the day of discharge. The patient is felt surgically stable for discharge today.   Latest Vital Signs: Blood pressure 113/80, pulse 65, temperature 98.8 F (37.1 C), temperature source Oral, resp. rate (!) 22, height 5\' 7"  (1.702 m), weight 171 lb 12.8 oz (77.9 kg), SpO2 95 %.  Physical Exam: Cardiovascular: IRRR IRRR Pulmonary: Clear to auscultation bilaterally Abdomen: Soft, non tender, bowel sounds present. Extremities: Trace bilateral lower extremity edema. Wounds: Clean and dry.  No erythema or signs of infection.    Discharge Condition:Stable and discharged to home.  Recent laboratory studies:  Lab Results  Component Value Date   WBC 17.2 (H) 01/16/2017   HGB 11.7 (L) 01/16/2017   HCT 34.5 (L) 01/16/2017   MCV 88.0 01/16/2017   PLT 293 01/16/2017   Lab Results  Component Value Date   NA 134 (L) 01/16/2017   K 3.9 01/16/2017   CL 104 01/16/2017   CO2 26 01/16/2017   CREATININE 0.91 01/16/2017   GLUCOSE 111 (H) 01/16/2017    Diagnostic Studies:  Dg Chest Port 1 View  Result Date: 01/15/2017 CLINICAL DATA:  Encounter for postop check. EXAM: PORTABLE CHEST 1 VIEW COMPARISON:  01/14/2017 FINDINGS: Since prior study, the right internal jugular central venous lines have been removed. There is no remaining support apparatus. Changes from the recent CABG surgery are stable. Cardiac silhouette is normal in size and configuration. No mediastinal widening. There is opacity at the left lung base obscuring portions of the left hemidiaphragm. This is consistent with a small effusion and atelectasis. A minimal right pleural effusion is suggested. Mild curvilinear  atelectasis extends laterally from the right hilum. Lungs otherwise clear with no evidence of pulmonary edema. No pneumothorax. IMPRESSION: 1. No acute findings or evidence of complication. No pulmonary edema, mediastinal widening or pneumothorax. 2. Lung base opacity, greater on the left, consistent with a combination of atelectasis and small effusions. Electronically Signed   By: Lajean Manes M.D.   On: 01/15/2017 08:29   Discharge Medications:  Allergies as of 01/17/2017      Reactions   Diovan [valsartan]    unknown      Medication List    STOP taking these medications   hydrochlorothiazide 25 MG tablet Commonly known as:  HYDRODIURIL     TAKE these medications   acetaminophen 500 MG tablet Commonly known as:  TYLENOL Take 500 mg by mouth every 6 (six) hours as needed for mild pain.   amiodarone 400 MG tablet Commonly known as:  PACERONE Take 1 tablet (400 mg total) by mouth 2 (two) times daily. For 7 days, then decrease to 200 mg by mouth two times daily for 2 weeks;then take 200 mg daily by thereafter   apixaban 5 MG Tabs tablet Commonly known as:  ELIQUIS Take 1 tablet (5 mg total) by mouth 2 (two) times daily.   aspirin 81 MG EC tablet Take 1 tablet (81 mg total)  by mouth daily.   atorvastatin 20 MG tablet Commonly known as:  LIPITOR Take 20 mg by mouth daily.   eplerenone 25 MG tablet Commonly known as:  INSPRA Take 1 tablet (25 mg total) by mouth daily. What changed:    how much to take  when to take this   metoprolol tartrate 25 MG tablet Commonly known as:  LOPRESSOR Take 1 tablet (25 mg total) by mouth 2 (two) times daily.   MULTI COMPLETE PO Take by mouth daily.   traMADol 50 MG tablet Commonly known as:  ULTRAM Take 1-2 tablets (50-100 mg total) by mouth every 4 (four) hours as needed for moderate pain.      The patient has been discharged on:   1.Beta Blocker:  Yes [  x ]                              No   [   ]                               If No, reason:  2.Ace Inhibitor/ARB: Yes [   ]                                     No  [ x   ]                                     If No, reason: labile BP  3.Statin:   Yes [x   ]                  No  [   ]                  If No, reason:  4.Ecasa:  Yes  [ x  ]                  No   [   ]                  If No, reason:  Follow Up Appointments: Follow-up Information    Grace Isaac, MD. Go on 02/24/2017.   Specialty:  Cardiothoracic Surgery Why:  PA/LAT CXR to be taken (at [facility] which is in the same building as Dr. Emelda Brothers office) on 02/24/2017 at 11:00 am ;Appointment time is at 11:30 am Contact information: [place] [address] [phone]        Satira Sark, MD. Call.   Specialty:  Cardiology Why:  for a follow up appointment for 2 weeks Contact information: [place] [address] [phone]        Sinda Du, MD. Call.   Specialty:  Pulmonary Disease Why:  for a follow up appointment regarding further surveillance of HGA1C 6.2 Contact information: [place] [address] [phone]           Signed: Sharalyn Ink ZimmermanPA-C 01/17/2017, 8:49 AM

## 2017-01-14 NOTE — Progress Notes (Signed)
      [address]       [place] [address]             [phone]      POD # 2 CABG  Stable day  Awaiting bed on 4 E  Zala Degrasse C. Roxan Hockey, MD [facility] [phone]

## 2017-01-14 NOTE — Plan of Care (Signed)
Pt ambulated 640 ft this morning. Tolerated well per report from RN. Pt is eager to walk again.

## 2017-01-15 ENCOUNTER — Inpatient Hospital Stay (HOSPITAL_COMMUNITY): Payer: PPO

## 2017-01-15 DIAGNOSIS — Z951 Presence of aortocoronary bypass graft: Secondary | ICD-10-CM

## 2017-01-15 LAB — BASIC METABOLIC PANEL
Anion gap: 6 (ref 5–15)
BUN: 17 mg/dL (ref 6–20)
CO2: 25 mmol/L (ref 22–32)
Calcium: 7.9 mg/dL — ABNORMAL LOW (ref 8.9–10.3)
Chloride: 102 mmol/L (ref 101–111)
Creatinine, Ser: 0.79 mg/dL (ref 0.61–1.24)
GFR calc Af Amer: >60 mL/min (ref >60)
GFR calc non Af Amer: >60 mL/min (ref >60)
Glucose, Bld: 115 mg/dL — ABNORMAL HIGH (ref 65–99)
Potassium: 4 mmol/L (ref 3.5–5.1)
Sodium: 133 mmol/L — ABNORMAL LOW (ref 135–145)

## 2017-01-15 LAB — CBC
HCT: 34.1 % — ABNORMAL LOW (ref 39.0–52.0)
Hemoglobin: 11.6 g/dL — ABNORMAL LOW (ref 13.0–17.0)
MCH: 30.1 pg (ref 26.0–34.0)
MCHC: 34 g/dL (ref 30.0–36.0)
MCV: 88.3 fL (ref 78.0–100.0)
Platelets: 204 10*3/uL (ref 150–400)
RBC: 3.86 MIL/uL — ABNORMAL LOW (ref 4.22–5.81)
RDW: 12.8 % (ref 11.5–15.5)
WBC: 18.6 10*3/uL — ABNORMAL HIGH (ref 4.0–10.5)

## 2017-01-15 LAB — GLUCOSE, CAPILLARY
Glucose-Capillary: 92 mg/dL (ref 65–99)
Glucose-Capillary: 118 mg/dL — ABNORMAL HIGH (ref 65–99)

## 2017-01-15 MED ORDER — AMIODARONE IV BOLUS ONLY 150 MG/100ML
150.0000 mg | Freq: Once | INTRAVENOUS | Status: AC
  Administered 2017-01-15: 150 mg via INTRAVENOUS
  Filled 2017-01-15: qty 100

## 2017-01-15 MED ORDER — AMIODARONE HCL IN DEXTROSE 360-4.14 MG/200ML-% IV SOLN
30.0000 mg/h | INTRAVENOUS | Status: DC
  Filled 2017-01-15 (×2): qty 200

## 2017-01-15 MED ORDER — METOPROLOL TARTRATE 5 MG/5ML IV SOLN: 5.0000 mg | INTRAVENOUS | Status: DC | PRN

## 2017-01-15 MED ORDER — AMIODARONE HCL IN DEXTROSE 360-4.14 MG/200ML-% IV SOLN
INTRAVENOUS | Status: AC
  Filled 2017-01-15: qty 200

## 2017-01-15 MED ORDER — AMIODARONE HCL 200 MG PO TABS
400.0000 mg | ORAL_TABLET | Freq: Two times a day (BID) | ORAL | Status: DC
  Administered 2017-01-15 – 2017-01-17 (×5): 400 mg via ORAL
  Filled 2017-01-15 (×5): qty 2

## 2017-01-15 MED ORDER — AMIODARONE HCL IN DEXTROSE 360-4.14 MG/200ML-% IV SOLN: 60.0000 mg/h | INTRAVENOUS | Status: AC

## 2017-01-15 MED ORDER — METOPROLOL TARTRATE 25 MG PO TABS
25.0000 mg | ORAL_TABLET | Freq: Two times a day (BID) | ORAL | Status: DC
  Administered 2017-01-15 – 2017-01-17 (×6): 25 mg via ORAL
  Filled 2017-01-15 (×6): qty 1

## 2017-01-15 MED ORDER — APIXABAN 5 MG PO TABS
5.0000 mg | ORAL_TABLET | Freq: Two times a day (BID) | ORAL | Status: DC
  Administered 2017-01-16 – 2017-01-17 (×3): 5 mg via ORAL
  Filled 2017-01-15 (×3): qty 1

## 2017-01-15 MED ORDER — AMIODARONE LOAD VIA INFUSION
150.0000 mg | Freq: Once | INTRAVENOUS | Status: AC
  Administered 2017-01-15: 150 mg via INTRAVENOUS
  Filled 2017-01-15: qty 83.34

## 2017-01-15 NOTE — Progress Notes (Addendum)
ANTICOAGULATION CONSULT NOTE - Initial Consult  Pharmacy Consult for apixaban Indication: atrial fibrillation  Allergies  Allergen Reactions  . Diovan [Valsartan]     unknown    Patient Measurements: Height: 5\' 7"  (170.2 cm) Weight: 179 lb 0.2 oz (81.2 kg) IBW/kg (Calculated) : 66.1  Vital Signs: Temp: 98.7 F (37.1 C) (12/22 0922) Temp Source: Oral (12/22 0922) BP: 128/86 (12/22 0908) Pulse Rate: 84 (12/22 0908)  Labs: Recent Labs    01/12/17 1439  01/13/17 1557 01/13/17 1610 01/14/17 0432 01/15/17 0254  HGB 12.0*   < > 11.0* 10.5* 10.5* 11.6*  HCT 34.3*   < > 31.7* 31.0* 31.0* 34.1*  PLT 185   < > 186  --  174 204  APTT 34  --   --   --   --   --   LABPROT 17.4*  --   --   --   --   --   INR 1.44  --   --   --   --   --   CREATININE  --    < > 1.03 0.90 0.89 0.79   < > = values in this interval not displayed.    Estimated Creatinine Clearance: 81.4 mL/min (by C-G formula based on SCr of 0.79 mg/dL).   Medical History: Past Medical History:  Diagnosis Date  . Arthritis   . Essential hypertension, benign   . Gynecomastia   . Hyperaldosteronism   . Spinal stenosis   . Temporomandibular joint disease   . Wears glasses     Assessment: 76 year old male s/p cabg on 12/19. AFib noted in OR and again overnight. Orders for surgery to start apixaban tomorrow. Currently in NSR.   Hgb stable 11s. No postop bleeding noted. He continues on IV amio for now.   Goal of Therapy:  Monitor platelets by anticoagulation protocol: Yes   Plan:  Apixaban 5mg  bid - starting 12/23  Erin Hearing PharmD., BCPS Clinical Pharmacist 01/15/2017 9:36 AM

## 2017-01-15 NOTE — Progress Notes (Signed)
After ambulating to bathroom, patient went in  Atrial Fibrilation with rapid ventricular response provider on call notified orders given to Start Amiodarone Bolus and continuous drip and Lopressor 25 mg BID. Will continue to monitor patient and provide care

## 2017-01-15 NOTE — Progress Notes (Signed)
3 Days Post-Op Procedure(s) (LRB): CORONARY ARTERY BYPASS GRAFTING (CABG) (N/A) TRANSESOPHAGEAL ECHOCARDIOGRAM (TEE) (N/A) Subjective: Atrial fib last night Feels better this AM  Objective: Vital signs in last 24 hours: Temp:  [97.6 F (36.4 C)-98.5 F (36.9 C)] 98.1 F (36.7 C) (12/21 2346) Pulse Rate:  [79-120] 84 (12/22 0908) Cardiac Rhythm: Atrial fibrillation (12/22 0400) Resp:  [14-29] 27 (12/22 0800) BP: (120-148)/(74-94) 128/86 (12/22 0908) SpO2:  [86 %-100 %] 96 % (12/21 1800) Weight:  [179 lb 0.2 oz (81.2 kg)] 179 lb 0.2 oz (81.2 kg) (12/22 0600)  Hemodynamic parameters for last 24 hours:    Intake/Output from previous day: 12/21 0701 - 12/22 0700 In: 150 [I.V.:50; IV Piggyback:100] Out: 370 [Urine:370] Intake/Output this shift: No intake/output data recorded.  General appearance: alert, cooperative and no distress Neurologic: intact Heart: regular rate and rhythm Lungs: diminished breath sounds bibasilar Abdomen: normal findings: soft, non-tender  Lab Results: Recent Labs    01/14/17 0432 01/15/17 0254  WBC 15.8* 18.6*  HGB 10.5* 11.6*  HCT 31.0* 34.1*  PLT 174 204   BMET:  Recent Labs    01/14/17 0432 01/15/17 0254  NA 134* 133*  K 3.2* 4.0  CL 102 102  CO2 27 25  GLUCOSE 128* 115*  BUN 17 17  CREATININE 0.89 0.79  CALCIUM 8.0* 7.9*    PT/INR:  Recent Labs    01/12/17 1439  LABPROT 17.4*  INR 1.44   ABG    Component Value Date/Time   PHART 7.371 01/12/2017 2037   HCO3 22.6 01/12/2017 2037   TCO2 22 01/13/2017 1610   ACIDBASEDEF 2.0 01/12/2017 2037   O2SAT 100.0 01/12/2017 2037   CBG (last 3)  Recent Labs    01/14/17 1144 01/14/17 1610 01/14/17 2113  GLUCAP 140* 133* 124*    Assessment/Plan: S/P Procedure(s) (LRB): CORONARY ARTERY BYPASS GRAFTING (CABG) (N/A) TRANSESOPHAGEAL ECHOCARDIOGRAM (TEE) (N/A) Plan for transfer to step-down: see transfer orders  CV- went back into atrial fib briefly overnight- now in SR on  amiodarone  Will change amio to PO  Second episode- first was in OR- will start Eliquis prior to dc  Dc pacing wires  RESP_ continue IS  RENAL- creatinine and lytes OK  ENDO- CBG well controlled  Awaiting bed on 4E    LOS: 4 days    Christopher Booth 01/15/2017

## 2017-01-15 NOTE — Progress Notes (Signed)
Anesthesiology Follow-up.  Awake and alert, walking around the unit well, had episode of atrial fib last night lasting approximately 4 hours, given amiodarone, lopressor. Has maintained SR since 7.00 AM today. Now being transferred to 4E.  VS: T-36.7 BP- 120/85 HR- 83 RR- 20 O2 Sat 98% on RA  Na- 133 K-4.0 glucose- 131 BUN/Cr.- 17/0.79 H/H- 11.6/34.1 Platelets- 204,000  Extubated 5 hours post-op.  76 year old male 3 days S/P CABG X 3 following non-stemi. Had transient episode of afib, but maintaining SR for last 10 hours. Otherwise post-op course uncomplicated.  Roberts Gaudy, MD

## 2017-01-16 LAB — BASIC METABOLIC PANEL
Anion gap: 4 — ABNORMAL LOW (ref 5–15)
BUN: 15 mg/dL (ref 6–20)
CHLORIDE: 104 mmol/L (ref 101–111)
CO2: 26 mmol/L (ref 22–32)
CREATININE: 0.91 mg/dL (ref 0.61–1.24)
Calcium: 7.9 mg/dL — ABNORMAL LOW (ref 8.9–10.3)
GFR calc Af Amer: >60 mL/min (ref >60)
GFR calc non Af Amer: >60 mL/min (ref >60)
GLUCOSE: 111 mg/dL — AB (ref 65–99)
Potassium: 3.9 mmol/L (ref 3.5–5.1)
SODIUM: 134 mmol/L — AB (ref 135–145)

## 2017-01-16 LAB — CBC
HCT: 34.5 % — ABNORMAL LOW (ref 39.0–52.0)
HEMOGLOBIN: 11.7 g/dL — AB (ref 13.0–17.0)
MCH: 29.8 pg (ref 26.0–34.0)
MCHC: 33.9 g/dL (ref 30.0–36.0)
MCV: 88 fL (ref 78.0–100.0)
Platelets: 293 10*3/uL (ref 150–400)
RBC: 3.92 MIL/uL — ABNORMAL LOW (ref 4.22–5.81)
RDW: 12.9 % (ref 11.5–15.5)
WBC: 17.2 10*3/uL — ABNORMAL HIGH (ref 4.0–10.5)

## 2017-01-16 LAB — GLUCOSE, CAPILLARY
GLUCOSE-CAPILLARY: 88 mg/dL (ref 65–99)
GLUCOSE-CAPILLARY: 124 mg/dL — AB (ref 65–99)
GLUCOSE-CAPILLARY: 100 mg/dL — AB (ref 65–99)
Glucose-Capillary: 106 mg/dL — ABNORMAL HIGH (ref 65–99)

## 2017-01-16 MED ORDER — AMIODARONE HCL 400 MG PO TABS: 400.0000 mg | ORAL_TABLET | Freq: Two times a day (BID) | ORAL | 0 refills | Status: DC

## 2017-01-16 MED ORDER — TRAMADOL HCL 50 MG PO TABS: 50.0000 mg | ORAL_TABLET | ORAL | 0 refills | Status: DC | PRN

## 2017-01-16 MED ORDER — EPLERENONE 25 MG PO TABS
25.0000 mg | ORAL_TABLET | Freq: Every day | ORAL | Status: DC
Start: 2017-01-16 — End: 2018-01-10

## 2017-01-16 MED ORDER — METOPROLOL TARTRATE 50 MG PO TABS: 50.0000 mg | ORAL_TABLET | Freq: Two times a day (BID) | ORAL | 0 refills | Status: DC

## 2017-01-16 MED ORDER — ASPIRIN 81 MG PO TBEC: 81.0000 mg | DELAYED_RELEASE_TABLET | Freq: Every day | ORAL | Status: DC

## 2017-01-16 MED ORDER — AMIODARONE IV BOLUS ONLY 150 MG/100ML
150.0000 mg | Freq: Once | INTRAVENOUS | Status: AC
  Administered 2017-01-16: 150 mg via INTRAVENOUS
  Filled 2017-01-16: qty 100

## 2017-01-16 MED ORDER — APIXABAN 5 MG PO TABS: 5.0000 mg | ORAL_TABLET | Freq: Two times a day (BID) | ORAL | 0 refills | Status: DC

## 2017-01-16 NOTE — Progress Notes (Signed)
CT sutures removed as ordered steri strips applied. Will monitor patient. Amilya Haver, Bettina Gavia rN

## 2017-01-16 NOTE — Discharge Instructions (Addendum)
Coronary Artery Bypass Grafting, Care After This sheet gives you information about how to care for yourself after your procedure. Your health care provider may also give you more specific instructions. If you have problems or questions, contact your health care provider. What can I expect after the procedure? After the procedure, it is common to have:  Nausea and a lack of appetite.  Constipation.  Weakness and fatigue.  Depression or irritability.  Pain or discomfort in your incision areas.  Follow these instructions at home: Medicines  Take over-the-counter and prescription medicines only as told by your health care provider. Do not stop taking medicines or start any new medicines without approval from your health care provider.  If you were prescribed an antibiotic medicine, take it as told by your health care provider. Do not stop taking the antibiotic even if you start to feel better.  Do not drive or use heavy machinery while taking prescription pain medicine. Incision care  Follow instructions from your health care provider about how to take care of your incisions. Make sure you: ? Wash your hands with soap and water before you change your bandage (dressing). If soap and water are not available, use hand sanitizer. ? Change your dressing as told by your health care provider. ? Leave stitches (sutures), skin glue, or adhesive strips in place. These skin closures may need to stay in place for 2 weeks or longer. If adhesive strip edges start to loosen and curl up, you may trim the loose edges. Do not remove adhesive strips completely unless your health care provider tells you to do that.  Keep incision areas clean, dry, and protected.  Check your incision areas every day for signs of infection. Check for: ? More redness, swelling, or pain. ? More fluid or blood. ? Warmth. ? Pus or a bad smell.  If incisions were made in your legs: ? Avoid crossing your legs. ? Avoid  sitting for long periods of time. Change positions every 30 minutes. ? Raise (elevate) your legs when you are sitting. Bathing  Do not take baths, swim, or use a hot tub until your health care provider approves.  Only take sponge baths. Pat the incisions dry. Do not rub incisions with a washcloth or towel.  Ask your health care provider when you can shower. Eating and drinking  Eat foods that are high in fiber, such as raw fruits and vegetables, whole grains, beans, and nuts. Meats should be lean cut. Avoid canned, processed, and fried foods. This can help prevent constipation and is a recommended part of a heart-healthy diet.  Drink enough fluid to keep your urine clear or pale yellow.  Limit alcohol intake to no more than 1 drink a day for nonpregnant women and 2 drinks a day for men. One drink equals 12 oz of beer, 5 oz of wine, or 1 oz of hard liquor. Activity  Rest and limit your activity as told by your health care provider. You may be instructed to: ? Stop any activity right away if you have chest pain, shortness of breath, irregular heartbeats, or dizziness. Get help right away if you have any of these symptoms. ? Move around frequently for short periods or take short walks as directed by your health care provider. Gradually increase your activities. You may need physical therapy or cardiac rehabilitation to help strengthen your muscles and build your endurance. ? Avoid lifting, pushing, or pulling anything that is heavier than 10 lb (4.5 kg) for at  least 6 weeks or as told by your health care provider.  Do not drive until your health care provider approves.  Ask your health care provider when you may return to work.  Ask your health care provider when you may resume sexual activity. General instructions  Do not use any products that contain nicotine or tobacco, such as cigarettes and e-cigarettes. If you need help quitting, ask your health care provider.  Take 2-3 deep  breaths every few hours during the day, while you recover. This helps expand your lungs and prevent complications like pneumonia after surgery.  If you were given a device called an incentive spirometer, use it several times a day to practice deep breathing. Support your chest with a pillow or your arms when you take deep breaths or cough.  Wear compression stockings as told by your health care provider. These stockings help to prevent blood clots and reduce swelling in your legs.  Weigh yourself every day. This helps identify if your body is holding (retaining) fluid that may make your heart and lungs work harder.  Keep all follow-up visits as told by your health care provider. This is important. Contact a health care provider if:  You have more redness, swelling, or pain around any incision.  You have more fluid or blood coming from any incision.  Any incision feels warm to the touch.  You have pus or a bad smell coming from any incision  You have a fever.  You have swelling in your ankles or legs.  You have pain in your legs.  You gain 2 lb (0.9 kg) or more a day.  You are nauseous or you vomit.  You have diarrhea. Get help right away if:  You have chest pain that spreads to your jaw or arms.  You are short of breath.  You have a fast or irregular heartbeat.  You notice a "clicking" in your breastbone (sternum) when you move.  You have numbness or weakness in your arms or legs.  You feel dizzy or light-headed. Summary  After the procedure, it is common to have pain or discomfort in the incision areas.  Do not take baths, swim, or use a hot tub until your health care provider approves.  Gradually increase your activities. You may need physical therapy or cardiac rehabilitation to help strengthen your muscles and build your endurance.  Weigh yourself every day. This helps identify if your body is holding (retaining) fluid that may make your heart and lungs work  harder. This information is not intended to replace advice given to you by your health care provider. Make sure you discuss any questions you have with your health care provider. Document Released: 07/31/2004 Document Revised: 12/01/2015 Document Reviewed: 12/01/2015 Elsevier Interactive Patient Education  2018 [employer].  Preventing Type 2 Diabetes Mellitus Type 2 diabetes (type 2 diabetes mellitus) is a long-term (chronic) disease that affects blood sugar (glucose) levels. Normally, a hormone called insulin allows glucose to enter cells in the body. The cells use glucose for energy. In type 2 diabetes, one or both of these problems may be present:  The body does not make enough insulin.  The body does not respond properly to insulin that it makes (insulin resistance).  Insulin resistance or lack of insulin causes excess glucose to build up in the blood instead of going into cells. As a result, high blood glucose (hyperglycemia) develops, which can cause many complications. Being overweight or obese and having an inactive (sedentary) lifestyle  can increase your risk for diabetes. Type 2 diabetes can be delayed or prevented by making certain nutrition and lifestyle changes. What nutrition changes can be made?  Eat healthy meals and snacks regularly. Keep a healthy snack with you for when you get hungry between meals, such as fruit or a handful of nuts.  Eat lean meats and proteins that are low in saturated fats, such as chicken, fish, egg whites, and beans. Avoid processed meats.  Eat plenty of fruits and vegetables and plenty of grains that have not been processed (whole grains). It is recommended that you eat: ? 1?2 cups of fruit every day. ? 2?3 cups of vegetables every day. ? 6?8 oz of whole grains every day, such as oats, whole wheat, bulgur, brown rice, quinoa, and millet.  Eat low-fat dairy products, such as milk, yogurt, and cheese.  Eat foods that contain healthy fats, such  as nuts, avocado, olive oil, and canola oil.  Drink water throughout the day. Avoid drinks that contain added sugar, such as soda or sweet tea.  Follow instructions from your health care provider about specific eating or drinking restrictions.  Control how much food you eat at a time (portion size). ? Check food labels to find out the serving sizes of foods. ? Use a kitchen scale to weigh amounts of foods.  Saute or steam food instead of frying it. Cook with water or broth instead of oils or butter.  Limit your intake of: ? Salt (sodium). Have no more than 1 tsp (2,400 mg) of sodium a day. If you have heart disease or high blood pressure, have less than ? tsp (1,500 mg) of sodium a day. ? Saturated fat. This is fat that is solid at room temperature, such as butter or fat on meat. What lifestyle changes can be made?  Activity  Do moderate-intensity physical activity for at least 30 minutes on at least 5 days of the week, or as much as told by your health care provider.  Ask your health care provider what activities are safe for you. A mix of physical activities may be best, such as walking, swimming, cycling, and strength training.  Try to add physical activity into your day. For example: ? Park in spots that are farther away than usual, so that you walk more. For example, park in a far corner of the parking lot when you go to the office or the grocery store. ? Take a walk during your lunch break. ? Use stairs instead of elevators or escalators. Weight Loss  Lose weight as directed. Your health care provider can determine how much weight loss is best for you and can help you lose weight safely.  If you are overweight or obese, you may be instructed to lose at least 5?7 % of your body weight. Alcohol and Tobacco   Limit alcohol intake to no more than 1 drink a day for nonpregnant women and 2 drinks a day for men. One drink equals 12 oz of beer, 5 oz of wine, or 1 oz of hard  liquor.  Do not use any tobacco products, such as cigarettes, chewing tobacco, and e-cigarettes. If you need help quitting, ask your health care provider. Work With [place] Provider  Have your blood glucose tested regularly, as told by your health care provider.  Discuss your risk factors and how you can reduce your risk for diabetes.  Get screening tests as told by your health care provider. You may have screening  tests regularly, especially if you have certain risk factors for type 2 diabetes.  Make an appointment with a diet and nutrition specialist (registered dietitian). A registered dietitian can help you make a healthy eating plan and can help you understand portion sizes and food labels. Why are these changes important?  It is possible to prevent or delay type 2 diabetes and related health problems by making lifestyle and nutrition changes.  It can be difficult to recognize signs of type 2 diabetes. The best way to avoid possible damage to your body is to take actions to prevent the disease before you develop symptoms. What can happen if changes are not made?  Your blood glucose levels may keep increasing. Having high blood glucose for a long time is dangerous. Too much glucose in your blood can damage your blood vessels, heart, kidneys, nerves, and eyes.  You may develop prediabetes or type 2 diabetes. Type 2 diabetes can lead to many chronic health problems and complications, such as: ? Heart disease. ? Stroke. ? Blindness. ? Kidney disease. ? Depression. ? Poor circulation in the feet and legs, which could lead to surgical removal (amputation) in severe cases. Where to find support:  Ask your health care provider to recommend a registered dietitian, diabetes educator, or weight loss program.  Look for local or online weight loss groups.  Join a gym, fitness club, or outdoor activity group, such as a walking club. Where to find more information: To learn more  about diabetes and diabetes prevention, visit:  American Diabetes Association (ADA): www.diabetes.[employer] of Diabetes and Digestive and Kidney Diseases: FindSpin.nl  To learn more about healthy eating, visit:  The U.S. Department of Agriculture Scientist, research (physical sciences)), Choose My Plate: http://wiley-williams.com/  Office of Disease Prevention and Health Promotion (ODPHP), Dietary Guidelines: SurferLive.at  Summary  You can reduce your risk for type 2 diabetes by increasing your physical activity, eating healthy foods, and losing weight as directed.  Talk with your health care provider about your risk for type 2 diabetes. Ask about any blood tests or screening tests that you need to have. This information is not intended to replace advice given to you by your health care provider. Make sure you discuss any questions you have with your health care provider. Document Released: 05/05/2015 Document Revised: 06/19/2015 Document Reviewed: 03/04/2015 Elsevier Interactive Patient Education  2018 [place] on my medicine - ELIQUIS (apixaban)  This medication education was reviewed with me or my healthcare representative as part of my discharge preparation.  Why was Eliquis prescribed for you? Eliquis was prescribed for you to reduce the risk of a blood clot forming that can cause a stroke if you have a medical condition called atrial fibrillation (a type of irregular heartbeat).  What do You need to know about Eliquis ? Take your Eliquis TWICE DAILY - one tablet in the morning and one tablet in the evening with or without food. If you have difficulty swallowing the tablet whole please discuss with your pharmacist how to take the medication safely.  Take Eliquis exactly as prescribed by your doctor and DO NOT stop taking Eliquis without talking to the doctor who prescribed the medication.  Stopping may increase  your risk of developing a stroke.  Refill your prescription before you run out.  After discharge, you should have regular check-up appointments with your healthcare provider that is prescribing your Eliquis.  In the future your dose may need to be changed if your kidney function or  weight changes by a significant amount or as you get older.  What do you do if you miss a dose? If you miss a dose, take it as soon as you remember on the same day and resume taking twice daily.  Do not take more than one dose of ELIQUIS at the same time to make up a missed dose.  Important Safety Information A possible side effect of Eliquis is bleeding. You should call your healthcare provider right away if you experience any of the following: ? Bleeding from an injury or your nose that does not stop. ? Unusual colored urine (red or dark brown) or unusual colored stools (red or black). ? Unusual bruising for unknown reasons. ? A serious fall or if you hit your head (even if there is no bleeding).  Some medicines may interact with Eliquis and might increase your risk of bleeding or clotting while on Eliquis. To help avoid this, consult your healthcare provider or pharmacist prior to using any new prescription or non-prescription medications, including herbals, vitamins, non-steroidal anti-inflammatory drugs (NSAIDs) and supplements.  This website has more information on Eliquis (apixaban): http://www.eliquis.com/eliquis/home

## 2017-01-16 NOTE — Progress Notes (Addendum)
Patient ambulated in hallway with nursing staff. rate 88-103. During the afternoon it appears patient in and out of atrial fib/NSR,  Back in room heart rate 88-91 will monitor patient. Joedy Eickhoff, Bettina Gavia rN

## 2017-01-16 NOTE — Progress Notes (Signed)
1030. Patient up to bathroom, back in Atrial fib on monitor rate up in the 130s. And also ranging from 115-130s, bp 113/94, pt asymptomatic at this time. [place] Dr. Roxan Hockey on floor and made aware will monitor patient. Verlan Grotz, Bettina Gavia RN

## 2017-01-16 NOTE — Op Note (Signed)
NAMEJAILON, SCHAIBLE NO.:  [id number]  MEDICAL RECORD NO.:  [id number]  LOCATION:  8E99B                        FACILITY:  [facility]  PHYSICIAN:  Lanelle Bal, MD    DATE OF BIRTH:  April 12, 1940  DATE OF PROCEDURE:  01/12/2017 DATE OF DISCHARGE:                              OPERATIVE REPORT   PREOPERATIVE DIAGNOSIS:  Three-vessel coronary occlusive disease with non-ST-elevation myocardial infarction less than 24 hours.  POSTOPERATIVE DIAGNOSIS:  Three-vessel coronary occlusive disease with non-ST-elevation myocardial infarction less than 24 hours.  SURGICAL PROCEDURE:  Coronary artery bypass grafting x3 with the left internal mammary to the left anterior descending coronary artery, reverse saphenous vein graft to the obtuse marginal coronary artery, reverse saphenous vein graft to the posterior descending coronary artery with right thigh and calf greater saphenous vein endoscopic vein harvesting.  SURGEON:  Lanelle Bal, MD.  FIRST ASSISTANT:  Lars Pinks, PA.  BRIEF HISTORY:  The patient is a 76 year old male with no previous history of coronary artery disease, who presented with 1-2 weeks of episodic chest discomfort, but on the day of admission presented to [facility] with episode of prolonged chest pain.  Troponins were elevated.  The patient was transferred to [facility] for cardiac catheterization, which was performed demonstrating 3-vessel coronary artery disease with 80% stenosis serially in the right, which was a relatively small vessel, a high-grade proximal circumflex lesion 90%, and proximal LAD disease of 80-90%.  With the patient's symptoms and significant 3-vessel coronary artery disease, coronary artery bypass graft was recommended to the patient.  Risks and options of surgery were discussed in detail and the patient was agreeable with proceeding and signed informed consent.  DESCRIPTION OF PROCEDURE:  With Swan-Ganz and arterial line  monitors in place, the patient underwent general endotracheal anesthesia without incident.  The skin and chest and legs was prepped with Betadine and draped in usual sterile manner.  Using endovein harvesting system, the right greater saphenous vein was harvested endoscopically from the right thigh and upper calf.  The vein was of good quality and caliber.  Median sternotomy was performed.  The left internal mammary artery was dissected down as a pedicle graft.  The distal artery was divided, had good free flow.  The vessel was hydrostatically dilated with heparinized saline.  Pericardium was opened.  Overall, ventricular function appeared preserved.  The patient was systemically heparinized.  Ascending aorta was cannulated.  The right atrium was cannulated.  An aortic root vent cardioplegia needle was introduced into the ascending aorta.  The patient was placed on cardiopulmonary bypass 2.4 L/min per sq m.  Sites of anastomoses were selected and dissected out of the epicardium.  The patient's body temperature was cooled to 32 degrees.  Aortic crossclamp was applied.  500 mL of cold blood potassium cardioplegia was administered with diastolic arrest of the heart.  Myocardial septal temperature was monitored throughout the cross-clamp.  Attention was turned first to the posterior descending coronary artery.  The distal right coronary artery was highly calcified.  The proximal posterior descending coronary artery was relatively small vessel, but was opened and admitted a 1-mm probe distally.  Using a running 7-0 Prolene, a distal  anastomosis was performed.  Additional cold blood cardioplegia was administered down the vein graft.  The heart was elevated and very distal branches of the circumflex were very small vessels and not bypassable, but there was a large obtuse marginal.  The primary supplier of the lateral wall was opened and admitted a 1.5-mm probe distally. Using a running 7-0  Prolene, distal anastomosis was performed. Attention was then turned to the left anterior descending coronary artery.  Between the mid and distal third of the vessel, it was opened and admitted a 1.5-mm probe distally and proximally.  Using a running 8- 0 Prolene, the left internal mammary artery was anastomosed to the left anterior descending coronary artery.  With the crossclamp still in place, 2 punch aortotomies were performed and each of the 2 vein grafts were anastomosed to the ascending aorta.  The bulldog was removed from the mammary artery with prompt rise in myocardial septal temperature. The heart was allowed to passively fill and de-air and the proximal anastomoses were completed.  Aortic cross-clamp was removed with total crossclamp time of 78 minutes.  The patient spontaneously converted to a sinus rhythm.  Sites of anastomoses were inspected, were free of bleeding.  The patient was then ventilated and weaned from cardiopulmonary bypass without difficulty.  He remained hemodynamically stable.  He was decannulated in usual fashion.  Protamine sulfate was administered with the operative field hemostatic.  Atrial and ventricular pacing wires were applied.  Graft markers were applied.  A left pleural tube and a Blake mediastinal drain were left in place.  The pericardium was loosely reapproximated.  Sternum was closed #6 stainless steel wire.  Fascia was closed with interrupted 0 Vicryl, running 3-0 Vicryl in the subcutaneous tissue, 4-0 subcuticular stitch in the skin edges.  Total pump time was 100.  Dry dressings were applied.  Sponge and needle count was reported as correct.  The patient did not require any blood bank blood products during the operative procedure.  Total pump time was 107 minutes.  The patient was transferred to Surgical Intensive Care Unit for further postoperative care.     Lanelle Bal, MD     EG/MEDQ  D:  01/16/2017  T:  01/16/2017  Job:   [id number]

## 2017-01-16 NOTE — Progress Notes (Addendum)
Patient back in Atrial fib  On monitor, EKG obtained, bp 108/74. Rate  116 Amiodarone bolus given as ordered will monitor patient. Natacha Jepsen, Bettina Gavia rN

## 2017-01-16 NOTE — Progress Notes (Signed)
Patient converted back to NSR rate 87 on monitor. Christopher Booth [facility] made aware will monitor patient. Vallen Calabrese, Bettina Gavia rN

## 2017-01-17 ENCOUNTER — Encounter (HOSPITAL_COMMUNITY): Payer: Self-pay | Admitting: Family

## 2017-01-17 LAB — GLUCOSE, CAPILLARY: Glucose-Capillary: 107 mg/dL — ABNORMAL HIGH (ref 65–99)

## 2017-01-17 MED ORDER — FUROSEMIDE 40 MG PO TABS
40.0000 mg | ORAL_TABLET | Freq: Every day | ORAL | Status: DC
  Administered 2017-01-17: 40 mg via ORAL
  Filled 2017-01-17: qty 1

## 2017-01-17 MED ORDER — AMIODARONE IV BOLUS ONLY 150 MG/100ML
150.0000 mg | Freq: Once | INTRAVENOUS | Status: AC
  Administered 2017-01-17: 150 mg via INTRAVENOUS
  Filled 2017-01-17: qty 100

## 2017-01-17 MED ORDER — METOPROLOL TARTRATE 25 MG PO TABS: 25.0000 mg | ORAL_TABLET | Freq: Two times a day (BID) | ORAL | 1 refills | Status: DC

## 2017-01-17 MED ORDER — AMIODARONE HCL 400 MG PO TABS: 400.0000 mg | ORAL_TABLET | Freq: Two times a day (BID) | ORAL | 0 refills | Status: DC

## 2017-01-17 MED ORDER — APIXABAN 5 MG PO TABS: 5.0000 mg | ORAL_TABLET | Freq: Two times a day (BID) | ORAL | 1 refills | Status: DC

## 2017-01-17 NOTE — Progress Notes (Signed)
CARDIAC REHAB PHASE I   Pt in bed with wife at beside. Informed by nurse to complete education due to patient just starting amiodarone drip. Ed completed with pt and wife. Pt very anxious to go home. 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 Pt is more worried about wife than his recovery. Provided emotional support to patient. Reviewed IS use with practice, MI booklet, sternal precautions, exercise guidelines, restrictions, nutrition and open heart surgery discharge video. Pt and wife voiced understanding of education. Pt open to cardiac rehab outpatient. Will send referral to [facility] per pt request. Assisted pt with getting out of bed to readjust sheets and helped pt back to bed. Pt requested potassium pills be crushed for consumption. Notified RN. Was informed by RN to let pt know to put pill in applesauce to help him take them. Told pt information and gave him applesauce. Call bell within reach and wife at beside.   [phone]  Carma Lair MS, ACSM CEP  8:53 AM 01/17/2017

## 2017-01-17 NOTE — Care Management Important Message (Signed)
Important Message  Patient Details  Name: Christopher Booth MRN: [id number] Date of Birth: 15-Feb-1940   Medicare Important Message Given:  Yes    Nathen May 01/17/2017, 9:38 AM

## 2017-01-17 NOTE — Care Management Note (Signed)
Case Management Note Original Note Created Zenon Mayo, RN 01/13/2017, 3:27 PM   Patient Details  Name: Christopher Booth MRN: [id number] Date of Birth: 08-31-1940  Subjective/Objective:  From home with spouse, POD 1 CABG.  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  He did ambulate with RN today. He states [name] and [name] lives in [place] and they will be able to assist them at discharge.  He states he is concern for wife who had a stroke and she needs help in getting in tub with shower chair because he is not able to lift the chair.  NCM informed him if need private sitter to go to Medicare. Gov to locate this.  Also if she needs [place] she will have to go thru her PCP office.  Patient states he thinks they will be fine because [name] and [name] will be there sometimes, suggested that when they come ,to let wife go ahead and take shower then while they are there.  NCM contacted the [facility], but they are not able to assist with  The needs he has.  NCM also made referral to [place] with [facility], she states patient is on her list to see.   NCM also received a call back from the [place] stating to try the [facility].                  Action/Plan: NCM will follow for dc needs.   Expected Discharge Date:  01/17/17               Expected Discharge Plan:  Home/Self Care  In-House Referral:     Discharge planning Services  CM Consult, Medication Assistance  Post Acute Care Choice:  NA Choice offered to:  NA  DME Arranged:    DME Agency:     HH Arranged:    HH Agency:     Status of Service:  Completed, signed off  If discussed at [employer] of Stay Meetings, dates discussed:    Discharge Disposition: home/self care   Additional Comments:  01/17/17- 1000- [name] RN, CM- pt for d/c home today- has been started on Eliquis- spoke with pt at bedside- 30 day free card provided to pt to use today on discharge- unable to check copay benefits as  insurance company closed today due to Christmas holiday- pt will f/u with pharmacy for copay cost.   [name], RN 01/17/2017, 10:08 AM [phone]

## 2017-01-17 NOTE — Progress Notes (Signed)
Discussed with the patient and all questioned fully answered. He will call me if any problems arise.  Telemetry removed, CCMD notified. IV removed.   Fritz Pickerel, RN

## 2017-01-17 NOTE — Consult Note (Addendum)
   [facility] Inpatient Consult   01/17/2017  ORESTES GEIMAN [birth year] [id number]  Follow up:  Met with the patient and wife, Elzie Rings at the bedside.  Patient is in the [employer] plan.  Patient to discharge today home. Patient endorses Dr. Leanora Ivanoff as his primary care provider.  He states he has help coming this week as his wife's cousin is coming down to assist them.Marland Kitchen  His wife has had a stroke and he is her caregiver. They have very limited support.  He would like [facility] for additional resources for possible assistance for transportation [interested in [facility]] , and maybe meals next week.  Explained [facility] services. Consent form signed and copy given.  Will have community to follow up on transition of care needs.  Explained that [place] [facility] does not replace or interfere with any services needed or assigned by inpatient care management staff.  Spoke with inpatient RNCM, Steffanie Dunn that patient to be followed up by [facility].  For questions,   [name], RN BSN [facility] Liaison  [phone] business mobile phone Toll free office [phone]

## 2017-01-17 NOTE — Progress Notes (Addendum)
      [address]       [place] [address]             [phone]        5 Days Post-Op Procedure(s) (LRB): CORONARY ARTERY BYPASS GRAFTING (CABG) (N/A) TRANSESOPHAGEAL ECHOCARDIOGRAM (TEE) (N/A)  Subjective: Patient states he developed a dry cough last night and asked if he was on an ACE. He states his wife fell last night and he really needs to get her and him home.  Objective: Vital signs in last 24 hours: Temp:  [98.4 F (36.9 C)-99.4 F (37.4 C)] 98.4 F (36.9 C) (12/24 0447) Pulse Rate:  [83-125] 94 (12/24 0447) Cardiac Rhythm: Normal sinus rhythm (12/24 0230) Resp:  [18-28] 22 (12/24 0447) BP: (108-119)/(72-94) 113/74 (12/24 0447) SpO2:  [93 %-100 %] 94 % (12/24 0447) Weight:  [171 lb 12.8 oz (77.9 kg)] 171 lb 12.8 oz (77.9 kg) (12/24 0447)  Pre op weight 76.3 kg Current Weight  01/17/17 171 lb 12.8 oz (77.9 kg)    Hemodynamic parameters for last 24 hours:    Intake/Output from previous day: 12/23 0701 - 12/24 0700 In: 240 [P.O.:240] Out: -    Physical Exam:  Cardiovascular: Armandina Stammer Pulmonary: Clear to auscultation bilaterally Abdomen: Soft, non tender, bowel sounds present. Extremities: Trace bilateral lower extremity edema. Wounds: Clean and dry.  No erythema or signs of infection.  Lab Results: CBC: Recent Labs    01/15/17 0254 01/16/17 0252  WBC 18.6* 17.2*  HGB 11.6* 11.7*  HCT 34.1* 34.5*  PLT 204 293   BMET:  Recent Labs    01/15/17 0254 01/16/17 0252  NA 133* 134*  K 4.0 3.9  CL 102 104  CO2 25 26  GLUCOSE 115* 111*  BUN 17 15  CREATININE 0.79 0.91  CALCIUM 7.9* 7.9*    PT/INR:  Lab Results  Component Value Date   INR 1.44 01/12/2017   INR 0.94 01/11/2017   ABG:  INR: Will add last result for INR, ABG once components are confirmed Will add last 4 CBG results once components are confirmed  Assessment/Plan:  1. CV - S/p NSTEMI. A fib with HR 90's to 110's. Will give Amiodarone bolus this am. On  Spironolactone 25 mg daily, Amiodarone 400 mg bid, Lopressor 25 mg bid, and Eliquis 5 mg bid. 2.  Pulmonary - On room air. Encourage incentive spirometer. 3. Volume Overload - Will give Lasix daily 4.  Acute blood loss anemia - Last H and H stable at 11.7 and 34.5 5. CBGs 124/100/107. Pre op HGA1C 6.2. 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 Will need follow up with his medical doctor after discharge. Will provide diet recommendations with discharge instructions. 6. Hope to discharge later today  Sharalyn Ink [facility] 01/17/2017,7:08 AM  Patient wife fell in hospital last night, He wants to get home.  Early follow up with cardiology for afib, now in afib on eliquis  Plan d/c home today  I have seen and examined Katha Cabal and agree with the above assessment  and plan.  Grace Isaac MD Beeper [phone] Office [phone] 01/17/2017 9:07 AM

## 2017-01-19 ENCOUNTER — Encounter: Payer: Self-pay | Admitting: *Deleted

## 2017-01-19 ENCOUNTER — Other Ambulatory Visit: Payer: Self-pay | Admitting: *Deleted

## 2017-01-19 LAB — GLUCOSE, CAPILLARY
GLUCOSE-CAPILLARY: 141 mg/dL — AB (ref 65–99)
Glucose-Capillary: 125 mg/dL — ABNORMAL HIGH (ref 65–99)

## 2017-01-19 NOTE — Patient Outreach (Signed)
[facility]) Care Management [name], Transition of Care day 1  01/19/2017  Christopher Booth 11/15/1940 [id number]  Successful telephone outreach to Christopher Booth, 76 y/o male referred to [place] for transition of care after recent hospitalization December 18-24, 2018 for NSTEMI with CABG x 3 on 01/12/17.  Patient has history including, but not limited to, HTN/ HLD, and arthritis.  Patient was discharged home to self-care without home health services in place.  HIPAA/ identity verified during phone call today, and [name] services were discussed with patient, who provided verbal consent for [name] involvement in his care.  Explained that I was contacting patient for primary [name] RN CM [name], and that [name] would contact patient again next week for ongoing transition of care; patient verbalized understanding and agreement with this plan.  Today, patient self reports additional past medical history of pAF and hyperaldosteronism.  Patient reports "doing quite well" after recent hospital discharge and denies pain, concerns, or issues post-hospital discharge.  Patient sounds to be in no obvious/ apparent distress throughout entirety of today's phone call.  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  Patient further reports:  Medications: -- Has all medicationsand takes as prescribed;denies questions about current medications.  -- Verbalizes good general understanding of the purpose, dosing, and scheduling of medications.   -- denies issues with swallowing medications -- patient was recently discharged from the hospital and all medications were thoroughly reviewed with patient today; during medication reconciliation, patient reports that at the time of hospital discharge (after his discharge instructions were printed), he was verbally told by [name] to continue HCTZ 25 mg po QD, although this medication is listed as  discontinued on his discharge instructions; reports that he was told to follow up with his cardiologist for further instructions.  Patient also reports that he has continued taking inspira as previously instructed (25 mg po BID) (rather than decreasing to QD dosing as instructed on hospital discharge AVS); again states that he will follow up with cardiologist post-hospital discharge to clarify.  Provider appointments: -- All upcoming provider appointments were reviewed with patient today; scheduled appointment with [name] 02/24/17 -- patient verbalizes plans to contact both cardiologist and PCP "today" to schedule post-hospital office visit appointments  Social/ Community Resource needs: -- currently denies community resource needs, stating supportive family members that assist with care needs as indicated, "in the immediate" post-hospital discharge period; however, he reports that he is the primary caregiver for his wife, who has a previous history of CVA; states he "may need additional assistance" once family members return to their home after the holidays -- reports a former nurse colleague has arranged "a food chain," and reports having "plenty of food" currently available, adding that family members in town for holidays are also assisting  -- reports "friends" to provide transportation for patient to all provider appointments, errands, etc, until he is again "released to drive himself."  Advanced Directive (AD) Planning:   --reports currently has 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 -- denies incisional pain, "except" when he coughs, which he reports is self-limiting with use of splinting pillow. -- reports following discharge instructions around activity to "walk three times a day," reports tolerating well.  Patient denies further issues, concerns, or problems today.  I confirmed that patient has my direct phone number, the main [facility] CM office phone number, and the [facility] CM 24-hour nurse advice phone number should issues arise prior to next scheduled [place] outreach next week by phone with primary [place].   Plan:  Patient will promptly schedule post-hospital discharge office visits with cardiologist and PCP  I will make patient's PCP aware of [facility] CM involvement in patient's care, send PCP barrier letter  Will update patient's primary [facility] RN CM of today's successful telephone outreach to patient  [facility] CM Care Plan Problem One     Most Recent Value  Care Plan Problem One  Risk for hospital readmission secondary to recently diagnosed CAD with NSTEMI/ CABG  Role Documenting the Problem One  Care Management [name] for Problem One  Active  [facility] Long Term Goal   Over the next 31 days, patient will not experience hospital readmission, as evidenced by patient reporting and review of EMR during [facility] RN CCM outreach  [place] Term Goal Start Date  01/19/17  Interventions for Problem One Long Term Goal  Discussed with patient current clinical status, upcoming provider appointments,  medication reconciliation completed.  [facility] CCM TOC program initiated  [facility] CM Short Term Goal #1   Over the next 7 days, patient will schedule hospital follow up office visit appointments with cardiologist and PCP, as evidenced by patient  reporting during [facility] RN CCM outreach  [facility] CM Short Term Goal #1 Start Date  01/19/17  Interventions for Short Term Goal #1  Discussed with patient his plans to schedule hospital follow up appointments with cardiologist and PCP,  encouraged patient to schedule appointments promptly     Oneta Rack, RN, BSN, [place] Care Management  [phone]

## 2017-01-20 MED FILL — Sodium Chloride IV Soln 0.9%: INTRAVENOUS | Qty: 2000 | Status: AC

## 2017-01-20 MED FILL — Mannitol IV Soln 20%: INTRAVENOUS | Qty: 500 | Status: AC

## 2017-01-20 MED FILL — Heparin Sodium (Porcine) Inj 1000 Unit/ML: INTRAMUSCULAR | Qty: 10 | Status: AC

## 2017-01-20 MED FILL — Electrolyte-R (PH 7.4) Solution: INTRAVENOUS | Qty: 3000 | Status: AC

## 2017-01-20 MED FILL — Lidocaine HCl IV Inj 20 MG/ML: INTRAVENOUS | Qty: 5 | Status: AC

## 2017-01-20 MED FILL — Sodium Bicarbonate IV Soln 8.4%: INTRAVENOUS | Qty: 50 | Status: AC

## 2017-01-24 ENCOUNTER — Encounter: Payer: Self-pay | Admitting: *Deleted

## 2017-01-26 ENCOUNTER — Other Ambulatory Visit: Payer: Self-pay

## 2017-01-26 ENCOUNTER — Encounter: Payer: Self-pay | Admitting: *Deleted

## 2017-01-26 ENCOUNTER — Ambulatory Visit (INDEPENDENT_AMBULATORY_CARE_PROVIDER_SITE_OTHER): Payer: PPO | Admitting: Cardiology

## 2017-01-26 VITALS — BP 102/65 | HR 83 | Ht 67.0 in | Wt 166.8 lb

## 2017-01-26 DIAGNOSIS — I4891 Unspecified atrial fibrillation: Secondary | ICD-10-CM | POA: Diagnosis not present

## 2017-01-26 DIAGNOSIS — I25119 Atherosclerotic heart disease of native coronary artery with unspecified angina pectoris: Secondary | ICD-10-CM

## 2017-01-26 DIAGNOSIS — E269 Hyperaldosteronism, unspecified: Secondary | ICD-10-CM

## 2017-01-26 DIAGNOSIS — I9789 Other postprocedural complications and disorders of the circulatory system, not elsewhere classified: Secondary | ICD-10-CM | POA: Diagnosis not present

## 2017-01-26 DIAGNOSIS — E782 Mixed hyperlipidemia: Secondary | ICD-10-CM

## 2017-01-26 MED ORDER — FUROSEMIDE 20 MG PO TABS: 20.0000 mg | ORAL_TABLET | ORAL | 3 refills | Status: DC | PRN

## 2017-01-26 NOTE — Patient Instructions (Signed)
Medication Instructions:  Your physician has recommended you make the following change in your medication:    START Lasix 20 mg as needed for swelling   CONTINUE Amiodarone 200 mg daily   Please continue all other medications as prescribed  Labwork: NONE  Testing/Procedures: NONE  Follow-Up: Your physician recommends that you schedule a follow-up appointment in: [place]   Any Other Special Instructions Will Be Listed Below (If Applicable).  If you need a refill on your cardiac medications before your next appointment, please call your pharmacy.

## 2017-01-26 NOTE — Progress Notes (Signed)
Cardiology Office Note  Date: 01/26/2017   ID: Christopher Booth, DOB September 06, 1940, MRN [id number]  PCP: Sinda Du, MD  Primary Cardiologist: Rozann Lesches, MD   Chief Complaint  Patient presents with  . Coronary Artery Disease  . [facility] follow-up    History of Present Illness: Christopher Booth is a 77 y.o. male that I saw recently in consultation in mid December 2018 with NSTEMI. He was transferred from [facility] to [facility] for cardiac catheterization which revealed multivessel CAD including heavily calcified distal left main stenosis extending into the circumflex. He was seen by [name] and underwent CABG including LIMA to the LAD, SVG to the OM, and SVG to the RCA. Preoperative carotid Dopplers did not demonstrate any significant obstructive ICA stenoses. LVEF was found to be in the 60-65% range. Postoperative course was complicated by atrial fibrillation, he was placed on Eliquis and amiodarone. He was recently discharged on December 24.  He presents today for follow-up. Overall feels like he is progressing well. He has had some trouble sleeping, but appetite is good. Also having some tightness and swelling in his right leg, site of vein harvest. He reports compliance with his medications, no intolerances. Amiodarone is down to 200 mg once daily. He is checking blood pressure regularly at home, systolics are generally between 100-120 which is lower than usual for him. He feels like his heart rate is sometimes elevated and irregular, presumably atrial fibrillation, but this is not a persistent issue. He is walking 15 minutes 3 times a day.  I personally reviewed his ECG today which shows normal sinus rhythm.  Past Medical History:  Diagnosis Date  . Arthritis   . 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 Essential hypertension   . Gynecomastia   . Hyperaldosteronism   . Postoperative atrial fibrillation ([place])   . Spinal  stenosis   . Temporomandibular joint disease   . Wears glasses     Past Surgical History:  Procedure Laterality Date  . CATARACT EXTRACTION W/PHACO Right 01/28/2015   Procedure: CATARACT EXTRACTION PHACO AND INTRAOCULAR LENS PLACEMENT RIGHT EYE;  Surgeon: Williams Che, MD;  Location: [facility];  Service: Ophthalmology;  Laterality: Right;  CDE:27.23  . CATARACT EXTRACTION W/PHACO Left 04/14/2015   Procedure: CATARACT EXTRACTION PHACO AND INTRAOCULAR LENS PLACEMENT LEFT EYE CDE=15.86;  Surgeon: Williams Che, MD;  Location: [facility];  Service: Ophthalmology;  Laterality: Left;  . CERVICAL FUSION  1993  . CHOLECYSTECTOMY  1995  . COLONOSCOPY    . CORONARY ARTERY BYPASS GRAFT N/A 01/12/2017   Procedure: CORONARY ARTERY BYPASS GRAFTING (CABG);  Surgeon: Grace Isaac, MD;  Location: [place];  Service: Open Heart Surgery;  Laterality: N/A;  Times 3 using left internal mammary artery to LAD and endoscopically harvested right thigh saphenous vein to OM and PD  . KNEE ARTHROSCOPY Right 10/09/2013   Procedure: RIGHT ARTHROSCOPY KNEE Partial medial and lateral meniscal tear and chondroplasty;  Surgeon: Hessie Dibble, MD;  Location: [place];  Service: Orthopedics;  Laterality: Right;  Partial medial and lateral meniscal tear and chondroplasty   . LEFT HEART CATH AND CORONARY ANGIOGRAPHY N/A 01/11/2017   Procedure: LEFT HEART CATH AND CORONARY ANGIOGRAPHY;  Surgeon: Jettie Booze, MD;  Location: [place] CV [facility];  Service: Cardiovascular;  Laterality: N/A;  . TEE WITHOUT CARDIOVERSION N/A 01/12/2017   Procedure: TRANSESOPHAGEAL ECHOCARDIOGRAM (TEE);  Surgeon: Grace Isaac,  MD;  Location: [facility];  Service: Open Heart Surgery;  Laterality: N/A;  . TONSILLECTOMY      Current Outpatient Medications  Medication Sig Dispense Refill  . acetaminophen (TYLENOL) 500 MG tablet Take 500 mg by mouth every 6 (six) hours as needed for mild pain.     Marland Kitchen amiodarone (PACERONE) 200  MG tablet Take 200 mg by mouth daily.    Marland Kitchen apixaban (ELIQUIS) 5 MG TABS tablet Take 1 tablet (5 mg total) by mouth 2 (two) times daily. 60 tablet 1  . aspirin EC 81 MG EC tablet Take 1 tablet (81 mg total) by mouth daily.    Marland Kitchen atorvastatin (LIPITOR) 20 MG tablet Take 20 mg by mouth daily.    Marland Kitchen eplerenone (INSPRA) 25 MG tablet Take 1 tablet (25 mg total) by mouth daily.    . hydrochlorothiazide (HYDRODIURIL) 25 MG tablet Take 25 mg by mouth daily.    . metoprolol tartrate (LOPRESSOR) 25 MG tablet Take 1 tablet (25 mg total) by mouth 2 (two) times daily. 60 tablet 1  . Multiple Vitamins-Minerals (MULTI COMPLETE PO) Take by mouth daily.    . furosemide (LASIX) 20 MG tablet Take 1 tablet (20 mg total) by mouth as needed (swelling). 30 tablet 3   No current facility-administered medications for this visit.    Allergies:  Diovan [valsartan]   Social History: The patient  reports that  has never smoked. he has never used smokeless tobacco. He reports that he drinks alcohol. He reports that he does not use drugs.   ROS:  Please see the history of present illness. Otherwise, complete review of systems is positive for none.  All other systems are reviewed and negative.   Physical Exam: VS:  BP 102/65   Pulse 83   Ht 5\' 7"  (1.702 m)   Wt 166 lb 12.8 oz (75.7 kg)   SpO2 97% Comment: on room air  BMI 26.12 kg/m , BMI Body mass index is 26.12 kg/m.  Wt Readings from Last 3 Encounters:  01/26/17 166 lb 12.8 oz (75.7 kg)  01/17/17 171 lb 12.8 oz (77.9 kg)  01/15/15 173 lb (78.5 kg)    General: Patient appears comfortable at rest. HEENT: Conjunctiva and lids normal, oropharynx clear. Neck: Supple, no elevated JVP or carotid bruits, no thyromegaly. Lungs: Clear to auscultation, nonlabored breathing at rest. Thorax: Well-healing sternal incision. No erythema or drainage. Cardiac: Regular rate and rhythm, no S3, no pericardial rub. Abdomen: Soft, nontender, bowel sounds present. Extremities: 1-2+  right thigh and lower leg edema, distal pulses 2+. Skin: Warm and dry. Musculoskeletal: No kyphosis. Neuropsychiatric: Alert and oriented x3, affect grossly appropriate.  ECG: I personally reviewed the tracing from 01/16/2017 which showed atrial fibrillation.  Recent Labwork: 01/13/2017: Magnesium 2.3; TSH 0.493 01/16/2017: BUN 15; Creatinine, Ser 0.91; Hemoglobin 11.7; Platelets 293; Potassium 3.9; Sodium 134     Component Value Date/Time   CHOL 137 01/12/2017 0148   TRIG 84 01/12/2017 0148   HDL 39 (L) 01/12/2017 0148   CHOLHDL 3.5 01/12/2017 0148   VLDL 17 01/12/2017 0148   LDLCALC 81 01/12/2017 0148    Other Studies Reviewed Today:  Echocardiogram 01/11/2017: Study Conclusions  - Left ventricle: The cavity size was normal. Wall thickness was   increased in a pattern of mild LVH. Systolic function was normal.   The estimated ejection fraction was in the range of 60% to 65%.   Wall motion was normal; there were no regional wall motion   abnormalities.  Doppler parameters are consistent with abnormal   left ventricular relaxation (grade 1 diastolic dysfunction). The   E/e&' ratio is between 8-15, suggesting indeterminate LV filling   pressure. - Aortic valve: Sclerosis without stenosis. There was trivial   regurgitation. - Aorta: Aortic root dimension: 41 mm (ED). - Aortic root: The aortic root is mildly dilated. - Mitral valve: Calcified annulus. Mildly thickened leaflets .   There was trivial regurgitation. - Left atrium: The atrium was normal in size. - Inferior vena cava: The vessel was normal in size. The   respirophasic diameter changes were in the normal range (= 50%),   consistent with normal central venous pressure.  Urgent and Critical Findings:   A non-critical finding, normal LV function, was reported to [name]. Impressions:  - LVEF 60-65%, mild LVH, normal wall motion, grade 1 DD,   indeterminate LV filling pressure, aortic sclerosis with  trivial   AI, mildly dilated aortic root to 4.1 cm, aortic valve sclerosis,   trace central AI, trivial MR, normal LA size, normal IVC  Cardiac catheterization 01/11/2017:  Ost Cx to Prox Cx lesion is 90% stenosed.  Mid LM to Dist LM lesion is 50% stenosed.  Prox to mid LAD lesion is 70% stenosed. Eccentric, best seen in the RAO caudal view.  Ost LAD to Prox LAD lesion is 40% stenosed.  Ost RCA to Prox RCA lesion is 80% stenosed.  Dist RCA lesion is 80% stenosed.  The left ventricular ejection fraction is 45-50% by visual estimate.  There is mild left ventricular systolic dysfunction.  LV end diastolic pressure is normal.  There is no aortic valve stenosis.   Multivessel disease with culprit lesion being heavily calcified distal left main lesion extending into the circumflex ostium.  Assessment and Plan:  1. Multivessel/left main CAD as outlined above now status post CABG. LVEF normal range. He is recuperating relatively well based on discussion and has follow-up with [name] later this month. He has had some right leg swelling status post vein harvesting, I will give him a prescription for low-dose Lasix to use as needed. He is walking regularly and is interested in cardiac rehabilitation as long as transportation is not an issue for him.  2. Postoperative atrial fibrillation, currently in sinus rhythm. Plan to continue both amiodarone and Eliquis for now. We will reassess over the next month.  3. History of hypertension and hyperaldosteronism. Blood pressure is been running lower than normal recently. He will cut back eplerenone to once daily dosing for now.  4. 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 Recent LDL 81.  Current medicines were reviewed with the patient today.   Orders Placed This Encounter  Procedures  . EKG 12-Lead    Disposition: Follow-up in one month.  Signed, Satira Sark, MD, [facility] 01/26/2017 9:21 AM    [facility] [place], [place], [place] [address] Phone: [phone]; Fax: [phone]

## 2017-01-27 ENCOUNTER — Encounter: Payer: Self-pay | Admitting: Cardiology

## 2017-01-28 ENCOUNTER — Telehealth: Payer: Self-pay | Admitting: *Deleted

## 2017-01-28 NOTE — Patient Outreach (Signed)
[place] [facility]) Care Management  01/28/2017  Christopher Booth 26-Jul-1940 [id number]   Transition of care  CM spoke with Christopher Booth to introduce CM as a [place] in [place] assigned to him.  Referred to previous call from Christopher Booth. 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 He confirms he did see Dr Domenic Polite, CV, on 01/26/17 has made a follow up appointment to see his MD. Dr Domenic Polite gave him a low dose of Lasix for prn and encouraged walking.   He reports being the care giver of his wife who is with a hx of CVA.  Reports he was presently completing laundry and was getting prepared to fix dinner for him and his wife from a selection of food left by family and friends. CM discussed possible available services through [facility] SW services He also reports tightness in his right leg in which he discussed with the CV on 01/26/17 also.  This is the site of vein harvest. CM discussed Resting, ice, compression and elevation (RICE) of the extremity if possible.  He voiced understanding.   He reports activity level as tolerated in his home. He has voiced interest in cardiac rehabilitation. CM discussed the differences in outpatient cardiac rehabilitation and in home physical therapy. He reports transportation is a concern at intervals when family or friends may not be available.  Reports he has heard of RCA TS and SCAT services. CM discussed Aging, Disability and Transit Services of [place])   He denies issues with receiving, paying for or getting medications and DME  Upcoming appointments- CV surgeon 02/24/17 1130 Dr Servando Snare CV Domenic Polite 03/03/17  0840 Dr Domenic Polite 1 month follow up   Plans CM left CM mobile number for any return calls and the [facility] 24 hour nurse number   Referred to [facility] SW services for possible needs related to care of disabled wife, food and transportation in [place] to follow Christopher Booth for  transition of care services   Route this note to Epic care team members and a note to discuss patient desire to go to cardiac rehabilitation outpatient when cleared by MDs, Luan Pulling and Domenic Polite  [facility] CM Care Plan Problem One     Most Recent Value  Care Plan Problem One  Risk for hospital readmission secondary to recently diagnosed CAD with NSTEMI/ CABG  Role Documenting the Problem One  Care Management Coordinator  Care Plan for Problem One  Active  [facility] Long Term Goal   Over the next 31 days, patient will not experience hospital readmission, as evidenced by patient reporting and review of EMR during [facility] RN CCM outreach  [place] Term Goal Start Date  01/19/17  Interventions for Problem One Long Term Goal  Discussed with patient current clinical status, upcoming provider appointments,  medication reconciliation completed.  [facility] CCM TOC program initiated  [facility] CM Short Term Goal #1   Over the next 7 days, patient will schedule hospital follow up office visit appointments with cardiologist and PCP, as evidenced by patient reporting during [facility] RN CCM outreach  [facility] CM Short Term Goal #1 Start Date  01/19/17  [facility] CM Short Term Goal #1 Met Date  01/26/17  Interventions for Short Term Goal #1  Discussed with patient his plans to schedule hospital follow up appointments with cardiologist and PCP,  encouraged patient to schedule appointments promptly    [facility] CM Care Plan Problem Two  Most Recent Value  Care Plan Problem Two  need of community resources for transportation and possible services for disabled wife  Role Documenting the Problem Two  Care Management [place] for Problem Two  Active  Interventions for Problem Two Long Term Goal   Discussed known available services, referral to [facility] SW, follow up on contact with [facility] SW after referral entered in [place] Goal  over the next 31 days patient will receive assistance from [facility] SW for community resources prn for transportation  and continued care of disabled spouse  [facility] Long Term Goal Start Date  01/28/17  [facility] CM Short Term Goal #1   over the next 10 days contact will be received from [place] Term Goal #1 Start Date  01/28/17  Interventions for Short Term Goal #2   Discussed known available services, referral to [facility] SW, follow up on contact with [facility] SW after referral entered in [place]. Lavina Hamman, RN, BSN, [place] Care Management [phone]

## 2017-02-04 ENCOUNTER — Other Ambulatory Visit: Payer: Self-pay | Admitting: *Deleted

## 2017-02-04 NOTE — Patient Outreach (Signed)
[place] [facility]) Care Management  02/04/2017  Christopher Booth 10/25/1940 [id number]   CSW had received referral from [place], Joellyn Quails for transportation & possible food resources. CSW called & spoke with patient who reports that he is now 24 days post op and has been doing ok - he has family & friends that have stepped up and provided "tremendous support". Patient states that he was concerned initially that until he sees the surgeon again, he's unable to drive but due to his friends and family helping out he does not see a need for CSW assistance but was appreciative of call.   CSW will perform a case closure on patient & will notify patient's RNCM with [facility], Kim of CSW's plans to close patient's case.    [name], LCSW [facility]  Clinical Social Worker cell #: [phone]

## 2017-02-08 ENCOUNTER — Telehealth: Payer: Self-pay | Admitting: *Deleted

## 2017-02-08 NOTE — Patient Outreach (Signed)
[place] [facility] [place] [facility]) Care Management  02/08/2017  VALERIO PINARD [birth year] [id number]   Transition of care  Call to Mr [place] home number but the electricity ws off He and his wife have been staying at friends in [place] during the early week inclement weather time He has been speaking with Dr Lianne Bushy Rn and confirmed not taking narcotics since discharge but only tylenol and he was cleared to drive Reports he and his wife are still without cable only related to inclement weather but are scheduled to go out of home to get hair cuts and run errands Continues to do light house work and care for his disabled wife  He has been contacted and assisted by [facility] SW on 02/04/17 with case closure   Plans continue to follow Mr Ar[place]y for Transition of care   [place]. Lavina Hamman, RN, BSN, [place] Care Management [phone]

## 2017-02-09 ENCOUNTER — Other Ambulatory Visit: Payer: Self-pay | Admitting: *Deleted

## 2017-02-11 NOTE — Patient Outreach (Signed)
Christopher Booth [facility]) Care Management   02/09/2017  Christopher Booth 1940/05/03 [id number]  Christopher Booth is an 77 y.o. male with a past medical history of PAF, hyperaldosteronism, PVCs (premature ventricular contractions), HTN, NSTEMI (non ST elevated Myocardial infarction), CAD, Mixed Hyperlipidemia, Arthritis and S/P CABG x 2.  [name] was referred to [place] for transition of care after recent hospitalization December 18-24, 2018 for NSTEMI with CABG x 3 on 01/12/17.    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    Subjective: see notes below  Objective:   BP 102/62   Pulse 89   Temp 98.2 F (36.8 C) (Oral)   Resp 20   SpO2 98%  Review of Systems  Constitutional: Negative for chills, diaphoresis, fever, malaise/fatigue and weight loss.  HENT: Negative.  Negative for congestion, ear discharge, ear pain, hearing loss, nosebleeds, sinus pain, sore throat and tinnitus.   Eyes: Negative.  Negative for blurred vision, double vision, photophobia, pain, discharge and redness.  Respiratory: Negative.  Negative for cough, hemoptysis, sputum production, shortness of breath, wheezing and stridor.   Cardiovascular: Positive for leg swelling. Negative for chest pain, palpitations, orthopnea, claudication and PND.  Gastrointestinal: Negative.  Negative for abdominal pain, blood in stool, constipation, diarrhea, heartburn, melena, nausea and vomiting.  Genitourinary: Negative.  Negative for dysuria, flank pain, frequency, hematuria and urgency.  Musculoskeletal: Positive for joint pain. Negative for back pain, falls, myalgias and neck pain.  Skin: Negative.  Negative for itching and rash.  Neurological: Positive for weakness. Negative for dizziness, tingling, tremors, sensory change, speech change, focal weakness, seizures, loss of consciousness and headaches.  Endo/Heme/Allergies:  Negative.  Negative for environmental allergies and polydipsia. Does not bruise/bleed easily.  Psychiatric/Behavioral: Negative.  Negative for depression, hallucinations, memory loss, substance abuse and suicidal ideas. The patient is not nervous/anxious and does not have insomnia.     Physical Exam  Constitutional: He is oriented to person, place, and time. He appears well-developed and well-nourished.  HENT:  Head: Normocephalic.  Eyes: Conjunctivae are normal. Pupils are equal, round, and reactive to light.  Neck: Normal range of motion. Neck supple.  Cardiovascular: Normal rate and regular rhythm.  Respiratory: Effort normal and breath sounds normal.  GI: Soft. Bowel sounds are normal.  Musculoskeletal: He exhibits edema.  Neurological: He is alert and oriented to person, place, and time.  Skin: Skin is warm and dry.  Psychiatric: He has a normal mood and affect. His behavior is normal. Judgment and thought content normal.    Encounter Medications:   Outpatient Encounter Medications as of 02/09/2017  Medication Sig Note  . acetaminophen (TYLENOL) 500 MG tablet Take 500 mg by mouth every 6 (six) hours as needed for mild pain.    Marland Kitchen amiodarone (PACERONE) 200 MG tablet Take 200 mg by mouth daily.   Marland Kitchen apixaban (ELIQUIS) 5 MG TABS tablet Take 1 tablet (5 mg total) by mouth 2 (two) times daily.   Marland Kitchen aspirin EC 81 MG EC tablet Take 1 tablet (81 mg total) by mouth daily.   Marland Kitchen atorvastatin (LIPITOR) 20 MG tablet Take 20 mg by mouth daily.   Marland Kitchen eplerenone (INSPRA) 25 MG tablet Take 1 tablet (25 mg total) by mouth daily. 01/19/2017: Pt. reports after hospital discharge AVS printed, was told by [name] to continue taking twice daily until he sees cardiologist for follow up  visit/ clarification  . furosemide (LASIX) 20 MG tablet Take 1 tablet (20 mg total) by mouth as needed (swelling).   . hydrochlorothiazide (HYDRODIURIL) 25 MG tablet Take 25 mg by mouth daily. 01/19/2017: Pt. reports after  hospital discharge AVS printed, was told by [name] to continue taking daily until he sees cardiologist for follow up visit/ clarification   . metoprolol tartrate (LOPRESSOR) 25 MG tablet Take 1 tablet (25 mg total) by mouth 2 (two) times daily.   . Multiple Vitamins-Minerals (MULTI COMPLETE PO) Take by mouth daily.    No facility-administered encounter medications on file as of 02/09/2017.     Functional Status:   In your present state of health, do you have any difficulty performing the following activities: 01/11/2017  Hearing? N  Vision? N  Difficulty concentrating or making decisions? N  Walking or climbing stairs? N  Dressing or bathing? N  Doing errands, shopping? Y  Some recent data might be hidden    Fall/Depression Screening:    Fall Risk  02/09/2017  Falls in the past year? No  Risk for fall due to : History of fall(s)   PHQ Mar 25, 2022 Scores 02/09/2017  PHQ - 2 Score 0    Assessment:   Community CM met with [name] at his home.  He had within the last 30 minutes completed a walk in his community and greets Cm at his door.   [name] voices that his he doing very well and maintaining activity and diet as ordered He discusses preparing to cook salmon today for him and his wife He has some right leg swelling but reports being aware of rest, ice, compression and elevation as interventions to assist with this plus use of his lasix as ordered He has been seen by Dr Domenic Polite, Cardiologist and will follow up in a month. Atrial fibrillation is managed with voice concerns with medications. His heart rate today is 63  Denies concerns with medication administration and cost of medications He has had good follow up visits with his providers and is awaiting the start of cardiac rehabilitation services He has been contacted as ordered by [facility] SW on 02/04/17 to offer assistance prn for his care and the care he provides to his wife . Cm has been notified of services/resources offered and SW  case closure   Provider appointments: -- All upcoming provider appointments were reviewed with patient today; scheduled appointment with [name] 02/24/17 --03/04/17 1320  Patient denies further issues, concerns, or problems today. CM confirmed that patient hasmy direct phone number, the main [facility] CM office phone number, and the [facility] CM 24-hour nurse advice phone number should issues arise prior to next scheduled [place] outreach     Plan:  Completed home visit and reviewed CAD, Afib action plans  Provided with [facility] Calendar Successful outreach letter sent to patient    Joelene Millin L. Lavina Hamman, RN, BSN, [place] Care Management [phone]

## 2017-02-14 ENCOUNTER — Encounter (HOSPITAL_COMMUNITY): Payer: Self-pay | Admitting: *Deleted

## 2017-02-23 ENCOUNTER — Other Ambulatory Visit: Payer: Self-pay | Admitting: Cardiothoracic Surgery

## 2017-02-23 DIAGNOSIS — Z951 Presence of aortocoronary bypass graft: Secondary | ICD-10-CM

## 2017-02-24 ENCOUNTER — Ambulatory Visit (INDEPENDENT_AMBULATORY_CARE_PROVIDER_SITE_OTHER): Payer: Self-pay | Admitting: Cardiothoracic Surgery

## 2017-02-24 ENCOUNTER — Encounter: Payer: Self-pay | Admitting: Cardiothoracic Surgery

## 2017-02-24 ENCOUNTER — Other Ambulatory Visit: Payer: Self-pay

## 2017-02-24 ENCOUNTER — Ambulatory Visit
Admission: RE | Admit: 2017-02-24 | Discharge: 2017-02-24 | Disposition: A | Discharge status: Home / Self Care | Payer: PPO | Source: Ambulatory Visit | Attending: Cardiothoracic Surgery | Admitting: Cardiothoracic Surgery

## 2017-02-24 VITALS — BP 148/97 | HR 77 | Resp 18 | Ht 67.0 in | Wt 173.6 lb

## 2017-02-24 DIAGNOSIS — Z09 Encounter for follow-up examination after completed treatment for conditions other than malignant neoplasm: Secondary | ICD-10-CM

## 2017-02-24 DIAGNOSIS — Z951 Presence of aortocoronary bypass graft: Secondary | ICD-10-CM

## 2017-02-24 DIAGNOSIS — R05 Cough: Secondary | ICD-10-CM | POA: Diagnosis not present

## 2017-02-24 NOTE — Progress Notes (Signed)
[address]       ,[place] [address]             [phone]      Christopher Booth [place] Medical Record #[id number] Date of Birth: 1940/02/06  Referring: Satira Sark, MD Primary Care: Sinda Du, MD Primary Cardiologist: No primary care provider on file.   Chief Complaint:   POST OP FOLLOW UP 01/12/2017  OPERATIVE REPORT PREOPERATIVE DIAGNOSIS:  Three-vessel coronary occlusive disease with non-ST-elevation myocardial infarction less than 24 hours. POSTOPERATIVE DIAGNOSIS:  Three-vessel coronary occlusive disease with non-ST-elevation myocardial infarction less than 24 hours. SURGICAL PROCEDURE:  Coronary artery bypass grafting x3 with the left internal mammary to the left anterior descending coronary artery, reverse saphenous vein graft to the obtuse marginal coronary artery, reverse saphenous vein graft to the posterior descending coronary artery with right thigh and calf greater saphenous vein endoscopic vein harvesting. SURGEON:  Lanelle Bal, MD.   History of Present Illness:     Patient doing well following coronary artery bypass grafting in December.  He is making good progress increase in his activity appropriately doing housework and caring for his wife who is incapacitated from a stroke.  He notes the last episode of atrial fib that he is aware of was January 2. He has had no overt symptoms of congestive heart failure     Past Medical History:  Diagnosis Date  . Arthritis   . 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 Essential hypertension   . Gynecomastia   . Hyperaldosteronism   . Postoperative atrial fibrillation ([place])   . Spinal stenosis   . Temporomandibular joint disease   . Wears glasses      Social History   Tobacco Use  Smoking Status Never Smoker  Smokeless Tobacco Never Used    Social History   Substance and Sexual Activity  Alcohol Use Yes   Comment:  Occasional     Allergies  Allergen Reactions  . Diovan [Valsartan]     unknown    Current Outpatient Medications  Medication Sig Dispense Refill  . acetaminophen (TYLENOL) 500 MG tablet Take 500 mg by mouth every 6 (six) hours as needed for mild pain.     Marland Kitchen amiodarone (PACERONE) 200 MG tablet Take 200 mg by mouth daily.    Marland Kitchen apixaban (ELIQUIS) 5 MG TABS tablet Take 1 tablet (5 mg total) by mouth 2 (two) times daily. 60 tablet 1  . aspirin EC 81 MG EC tablet Take 1 tablet (81 mg total) by mouth daily.    Marland Kitchen atorvastatin (LIPITOR) 20 MG tablet Take 20 mg by mouth daily.    Marland Kitchen eplerenone (INSPRA) 25 MG tablet Take 1 tablet (25 mg total) by mouth daily.    . furosemide (LASIX) 20 MG tablet Take 1 tablet (20 mg total) by mouth as needed (swelling). 30 tablet 3  . hydrochlorothiazide (HYDRODIURIL) 25 MG tablet Take 25 mg by mouth daily.    . metoprolol tartrate (LOPRESSOR) 25 MG tablet Take 1 tablet (25 mg total) by mouth 2 (two) times daily. 60 tablet 1  . Multiple Vitamins-Minerals (MULTI COMPLETE PO) Take by mouth daily.     No current facility-administered medications for this visit.        Physical Exam: BP (!) 148/97 (BP Location: Right Arm, Patient Position: Sitting, Cuff Size: Large)   Pulse 77   Resp 18   Ht 5\' 7"  (  1.702 m)   Wt 173 lb 9.6 oz (78.7 kg)   SpO2 98% Comment: RA  BMI 27.19 kg/m   General appearance: alert and cooperative Neurologic: intact Heart: regular rate and rhythm, S1, S2 normal, no murmur, click, rub or gallop Lungs: clear to auscultation bilaterally Abdomen: soft, non-tender; bowel sounds normal; no masses,  no organomegaly Extremities: edema Mild edema right lower extremity and Homans sign is negative, no sign of DVT Wound: Sternum is stable and well-healed has mild edema and thickening along the right thigh and a vein harvest site but without evidence of infection,    Diagnostic Studies & Laboratory data:     Recent Radiology Findings:   Dg  Chest 2 View  Result Date: 02/24/2017 CLINICAL DATA:  CABG.  Cough. EXAM: CHEST  2 VIEW COMPARISON:  01/15/2017.  01/14/2017.  01/13/2017. FINDINGS: Prior CABG. Cardiomegaly with normal pulmonary vascularity. Mild bibasilar densities again noted, left side greater than right. Left base pleural thickening. Similar findings noted on prior exam. Changes may be related to basilar atelectasis and/or scarring. Mild bibasilar pneumonia cannot be excluded. Small left pleural effusion cannot be excluded. No pneumothorax. IMPRESSION: 1.  Prior CABG.  No pulmonary venous congestion. 2. Mild bibasilar densities again noted, again left side greater than right. Mild left base pleural thickening. Similar findings noted on multiple prior exams. These changes could be related atelectasis and/or scarring. Mild pneumonia cannot be excluded. Small left pleural effusion cannot be excluded. Electronically Signed   By: Marcello Moores  Register   On: 02/24/2017 11:16   I have independently reviewed the above radiology studies  and reviewed the findings with the patient.     Recent Lab Findings: Lab Results  Component Value Date   WBC 17.2 (H) 01/16/2017   HGB 11.7 (L) 01/16/2017   HCT 34.5 (L) 01/16/2017   PLT 293 01/16/2017   GLUCOSE 111 (H) 01/16/2017   CHOL 137 01/12/2017   TRIG 84 01/12/2017   HDL 39 (L) 01/12/2017   LDLCALC 81 01/12/2017   NA 134 (L) 01/16/2017   K 3.9 01/16/2017   CL 104 01/16/2017   CREATININE 0.91 01/16/2017   BUN 15 01/16/2017   CO2 26 01/16/2017   TSH 0.493 01/13/2017   INR 1.44 01/12/2017   HGBA1C 6.2 (H) 01/12/2017      Assessment / Plan:      Patient doing well following coronary artery bypass grafting approximately 6 weeks ago.  He has been referred to cardiac rehab in [place] He continues on amiodarone and Eliquis for persistent postop atrial fibrillation History of hypertension and hyperaldosteronism We will plan to see the patient back as needed  Please with the  patient's progress, he will start in cardiac rehab in the near future  Grace Isaac MD      [address] [place] [address] Office [phone]   Beeper [phone]  02/24/2017 12:30 PM

## 2017-02-24 NOTE — Patient Instructions (Signed)
    [address]       [place] [address]             [phone]       Coronary Artery Bypass Grafting  Care After  Refer to this sheet in the next few weeks. These instructions provide you with information on caring for yourself after your procedure. Your caregiver may also give you more specific instructions. Your treatment has been planned according to current medical practices, but problems sometimes occur. Call your caregiver if you have any problems or questions after your procedure.  Recovery from open heart surgery will be different for everyone. Some people feel well after 3 or 4 weeks, while for others it takes longer. After heart surgery, it may be normal to:  Not have an appetite, feel nauseated by the smell of food, or only want to eat a small amount.   Be constipated because of changes in your diet, activity, and medicines. Eat foods high in fiber. Add fresh fruits and vegetables to your diet. Stool softeners may be helpful.   Feel sad or unhappy. You may be frustrated or cranky. You may have good days and bad days. Do not give up. Talk to your caregiver if you do not feel better.   Feel weakness and fatigue. You many need physical therapy or cardiac rehabilitation to get your strength back.   Develop an irregular heartbeat called atrial fibrillation. Symptoms of atrial fibrillation are a fast, irregular heartbeat or feelings of fluttery heartbeats, shortness of breath, low blood pressure, and dizziness. If these symptoms develop, see your caregiver right away.  MEDICATION  Have a list of all the medicines you will be taking when you leave the hospital. For every medicine, know the following:   Name.   Exact dose.   Time of day to be taken.   How often it should be taken.   Why you are taking it.   Ask which medicines should or should not be taken together. If you take more than one heart medicine, ask if it is okay to take them together. Some  heart medicines should not be taken at the same time because they may lower your blood pressure too much.   Narcotic pain medicine can cause constipation. Eat fresh fruits and vegetables. Add fiber to your diet. Stool softener medicine may help relieve constipation.   Keep a copy of your medicines with you at all times.   Do not add or stop taking any medicine until you check with your caregiver.   Medicines can have side effects. Call your caregiver who prescribed the medicine if you:   Start throwing up, have diarrhea, or have stomach pain.   Feel dizzy or lightheaded when you stand up.   Feel your heart is skipping beats or is beating too fast or too slow.   Develop a rash.   Notice unusual bruising or bleeding.  HOME CARE INSTRUCTIONS  After heart surgery, it is important to learn how to take your pulse. Have your caregiver show you how to take your pulse.   Use your incentive spirometer. Ask your caregiver how long after surgery you need to use it.  Care of your chest incision  Tell your caregiver right away if you notice clicking in your chest (sternum).   Support your chest with a pillow or your arms when you take deep breaths and cough.   Follow your caregiver's instructions about when you can bathe or   swim.   Protect your incision from sunlight during the first year to keep the scar from getting dark.   Tell your caregiver if you notice:   Increased tenderness of your incision.   Increased redness or swelling around your incision.   Drainage or pus from your incision.  Care of your leg incision(s)  Avoid crossing your legs.   Avoid sitting for long periods of time. Change positions every half hour.   Elevate your leg(s) when you are sitting.   Check your leg(s) daily for swelling. Check the incisions for redness or drainage.   Diet is very important to heart health.   Eat plenty of fresh fruits and vegetables. Meats should be lean cut. Avoid canned,  processed, and fried foods.   Talk to a dietician. They can teach you how to make healthy food and drink choices.  Weight  Weigh yourself every day. This is important because it helps to know if you are retaining fluid that may make your heart and lungs work harder.   Use the same scale each time.   Weigh yourself every morning at the same time. You should do this after you go to the bathroom, but before you eat breakfast.   Your weight will be more accurate if you do not wear any clothes.   Record your weight.   Tell your caregiver if you have gained 2 pounds or more overnight.  Activity Stop any activity at once if you have chest pain, shortness of breath, irregular heartbeats, or dizziness. Get help right away if you have any of these symptoms.  Bathing.  Avoid soaking in a bath or hot tub until your incisions are healed.   Rest. You need a balance of rest and activity.   Exercise. Exercise per your caregiver's advice. You may need physical therapy or cardiac rehabilitation to help strengthen your muscles and build your endurance.   Climbing stairs. Unless your caregiver tells you not to climb stairs, go up stairs slowly and rest if you tire. Do not pull yourself up by the handrail.   Driving a car. Follow your caregiver's advice on when you may drive. You may ride as a passenger at any time. When traveling for long periods of time in a car, get out of the car and walk around for a few minutes every 2 hours.   Lifting. Avoid lifting, pushing, or pulling anything heavier than 10 pounds for 6 weeks after surgery or as told by your caregiver.   Returning to work. Check with your caregiver. People heal at different rates. Most people will be able to go back to work 6 to 12 weeks after surgery.   Sexual activity. You may resume sexual relations as told by your caregiver.  SEEK MEDICAL CARE IF:  Any of your incisions are red, painful, or have any type of drainage coming from them.     You have an oral temperature above 101.5 F .   You have ankle or leg swelling.   You have pain in your legs.   You have weight gain of 2 or more pounds a day.   You feel dizzy or lightheaded when you stand up.  SEEK IMMEDIATE MEDICAL CARE IF:  You have angina or chest pain that goes to your jaw or arms. Call your local emergency services right away.   You have shortness of breath at rest or with activity.   You have a fast or irregular heartbeat (arrhythmia).   There is   a "clicking" in your sternum when you move.   You have numbness or weakness in your arms or legs.  MAKE SURE YOU:  Understand these instructions.   Will watch your condition.   Will get help right away if you are not doing well or get worse.    No lifting over 25 lbs for 3 months 

## 2017-03-03 ENCOUNTER — Ambulatory Visit: Payer: Self-pay | Admitting: Cardiology

## 2017-03-03 NOTE — Progress Notes (Signed)
Cardiology Office Note  Date: 03/04/2017   ID: Christopher Booth, DOB May 03, 1940, MRN [id number]  PCP: Sinda Du, MD  Primary Cardiologist: Rozann Lesches, MD   Chief Complaint  Patient presents with  . Coronary Artery Disease    History of Present Illness: Christopher Booth is a 77 y.o. male last seen in January, history outlined in the previous note.  He presents today for a follow-up visit, states that he has been doing very well overall recovering from CABG. He saw [name] in late January, I reviewed the note.  He is now walking up to 2 miles at a time, exercising at home with plan to start cardiac rehabilitation eventually.  He does not report any angina symptoms.  He does have a history of postoperative atrial fibrillation and has remained on amiodarone and Eliquis.  No definite recurrent arrhythmia, although he did feel rapid palpitations after his last visit with me.  We have discussed the matter and plan to have him drop amiodarone at this time but continue Eliquis until we are more certain he has not get to have recurring arrhythmias.  Of note, he remains on aspirin for now following his CABG.  He has had no bleeding episodes.  Also states that his blood pressure has gradually trended up.  He may need to end up increasing eplerenone to 25 mg twice daily.  Past Medical History:  Diagnosis Date  . Arthritis   . 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 Essential hypertension   . Gynecomastia   . Hyperaldosteronism   . Postoperative atrial fibrillation ([place])   . Spinal stenosis   . Temporomandibular joint disease   . Wears glasses     Past Surgical History:  Procedure Laterality Date  . CATARACT EXTRACTION W/PHACO Right 01/28/2015   Procedure: CATARACT EXTRACTION PHACO AND INTRAOCULAR LENS PLACEMENT RIGHT EYE;  Surgeon: Williams Che, MD;  Location: [facility];  Service: Ophthalmology;  Laterality: Right;  CDE:27.23  .  CATARACT EXTRACTION W/PHACO Left 04/14/2015   Procedure: CATARACT EXTRACTION PHACO AND INTRAOCULAR LENS PLACEMENT LEFT EYE CDE=15.86;  Surgeon: Williams Che, MD;  Location: [facility];  Service: Ophthalmology;  Laterality: Left;  . CERVICAL FUSION  1993  . CHOLECYSTECTOMY  1995  . COLONOSCOPY    . CORONARY ARTERY BYPASS GRAFT N/A 01/12/2017   Procedure: CORONARY ARTERY BYPASS GRAFTING (CABG);  Surgeon: Grace Isaac, MD;  Location: [facility];  Service: Open Heart Surgery;  Laterality: N/A;  Times 3 using left internal mammary artery to LAD and endoscopically harvested right thigh saphenous vein to OM and PD  . KNEE ARTHROSCOPY Right 10/09/2013   Procedure: RIGHT ARTHROSCOPY KNEE Partial medial and lateral meniscal tear and chondroplasty;  Surgeon: Hessie Dibble, MD;  Location: [facility];  Service: Orthopedics;  Laterality: Right;  Partial medial and lateral meniscal tear and chondroplasty   . LEFT HEART CATH AND CORONARY ANGIOGRAPHY N/A 01/11/2017   Procedure: LEFT HEART CATH AND CORONARY ANGIOGRAPHY;  Surgeon: Jettie Booze, MD;  Location: [facility];  Service: Cardiovascular;  Laterality: N/A;  . TEE WITHOUT CARDIOVERSION N/A 01/12/2017   Procedure: TRANSESOPHAGEAL ECHOCARDIOGRAM (TEE);  Surgeon: Grace Isaac, MD;  Location: [facility];  Service: Open Heart Surgery;  Laterality: N/A;  . TONSILLECTOMY      Current Outpatient Medications  Medication Sig Dispense Refill  . acetaminophen (TYLENOL) 500 MG tablet Take 500 mg by mouth every  6 (six) hours as needed for mild pain.     Marland Kitchen apixaban (ELIQUIS) 5 MG TABS tablet Take 1 tablet (5 mg total) by mouth 2 (two) times daily. 60 tablet 1  . aspirin EC 81 MG EC tablet Take 1 tablet (81 mg total) by mouth daily.    Marland Kitchen atorvastatin (LIPITOR) 20 MG tablet Take 20 mg by mouth daily.    Marland Kitchen eplerenone (INSPRA) 25 MG tablet Take 1 tablet (25 mg total) by mouth daily.    . furosemide (LASIX) 20 MG tablet Take 1 tablet (20 mg  total) by mouth as needed (swelling). 30 tablet 3  . hydrochlorothiazide (HYDRODIURIL) 25 MG tablet Take 25 mg by mouth daily.    . metoprolol tartrate (LOPRESSOR) 25 MG tablet Take 1 tablet (25 mg total) by mouth 2 (two) times daily. 60 tablet 1  . Multiple Vitamins-Minerals (MULTI COMPLETE PO) Take by mouth daily.     No current facility-administered medications for this visit.    Allergies:  Diovan [valsartan]   Social History: The patient  reports that  has never smoked. he has never used smokeless tobacco. He reports that he drinks alcohol. He reports that he does not use drugs.   ROS:  Please see the history of present illness. Otherwise, complete review of systems is positive for improving cough.  All other systems are reviewed and negative.   Physical Exam: VS:  BP 138/84   Pulse 75   Ht 5\' 7"  (1.702 m)   Wt 169 lb (76.7 kg)   SpO2 97%   BMI 26.47 kg/m , BMI Body mass index is 26.47 kg/m.  Wt Readings from Last 3 Encounters:  03/04/17 169 lb (76.7 kg)  02/24/17 173 lb 9.6 oz (78.7 kg)  01/26/17 166 lb 12.8 oz (75.7 kg)    General: Patient appears comfortable at rest. HEENT: Conjunctiva and lids normal, oropharynx clear. Neck: Supple, no elevated JVP or carotid bruits, no thyromegaly. Lungs: Clear to auscultation, nonlabored breathing at rest. Thorax: Well-healed sternal incision. Cardiac: Regular rate and rhythm, no S3 or significant systolic murmur, no pericardial rub. Abdomen: Soft, nontender, bowel sounds present. Extremities: No significant leg edema, distal pulses 2+. Skin: Warm and dry. Musculoskeletal: No kyphosis. Neuropsychiatric: Alert and oriented x3, affect grossly appropriate.  ECG: I personally reviewed the tracing from 01/26/2017 which showed sinus rhythm with nonspecific T wave changes.  Recent Labwork: 01/13/2017: Magnesium 2.3; TSH 0.493 01/16/2017: BUN 15; Creatinine, Ser 0.91; Hemoglobin 11.7; Platelets 293; Potassium 3.9; Sodium 134       Component Value Date/Time   CHOL 137 01/12/2017 0148   TRIG 84 01/12/2017 0148   HDL 39 (L) 01/12/2017 0148   CHOLHDL 3.5 01/12/2017 0148   VLDL 17 01/12/2017 0148   LDLCALC 81 01/12/2017 0148    Other Studies Reviewed Today:  Echocardiogram 01/11/2017: Study Conclusions  - Left ventricle: The cavity size was normal. Wall thickness was increased in a pattern of mild LVH. Systolic function was normal. The estimated ejection fraction was in the range of 60% to 65%. Wall motion was normal; there were no regional wall motion abnormalities. Doppler parameters are consistent with abnormal left ventricular relaxation (grade 1 diastolic dysfunction). The E/e&' ratio is between 8-15, suggesting indeterminate LV filling pressure. - Aortic valve: Sclerosis without stenosis. There was trivial regurgitation. - Aorta: Aortic root dimension: 41 mm (ED). - Aortic root: The aortic root is mildly dilated. - Mitral valve: Calcified annulus. Mildly thickened leaflets . There was trivial regurgitation. - Left  atrium: The atrium was normal in size. - Inferior vena cava: The vessel was normal in size. The respirophasic diameter changes were in the normal range (= 50%), consistent with normal central venous pressure.  Urgent and Critical Findings: A non-critical finding, normal LV function, was reported to [name]. Impressions:  - LVEF 60-65%, mild LVH, normal wall motion, grade 1 DD, indeterminate LV filling pressure, aortic sclerosis with trivial AI, mildly dilated aortic root to 4.1 cm, aortic valve sclerosis, trace central AI, trivial MR, normal LA size, normal IVC  Cardiac catheterization 01/11/2017:  Ost Cx to Prox Cx lesion is 90% stenosed.  Mid LM to Dist LM lesion is 50% stenosed.  Prox to mid LAD lesion is 70% stenosed. Eccentric, best seen in the RAO caudal view.  Ost LAD to Prox LAD lesion is 40% stenosed.  Ost RCA to Prox RCA lesion is  80% stenosed.  Dist RCA lesion is 80% stenosed.  The left ventricular ejection fraction is 45-50% by visual estimate.  There is mild left ventricular systolic dysfunction.  LV end diastolic pressure is normal.  There is no aortic valve stenosis.  Multivessel disease with culprit lesion being heavily calcified distal left main lesion extending into the circumflex ostium.  Assessment and Plan:  1.  Postoperative atrial fibrillation.  Heart rate is regular today.  He had one episode of rapid palpitations after I saw him in January.  After discussion we have decided to stop amiodarone for now, continue on Lopressor and Eliquis.  We will continue to follow to see if he has recurring arrhythmia or not. CHADSVASC score is 4.  2.  Multivessel CAD status post CABG.  He is recuperating well with plans to start cardiac rehabilitation.  Leg swelling has improved spontaneously, he never did have to use Lasix.  Continue on aspirin for now.  3.  History of hypertension and hyperaldosteronism.  Blood pressure started to trend back up.  Expect that he will likely need to increase eplerenone to 25 mg twice daily.  In the past he required a higher dose.  4.  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  Current medicines were reviewed with the patient today.  Disposition: Follow-up in 2 months.  Signed, Satira Sark, MD, [facility] 03/04/2017 2:02 PM    [facility] [address] [place], [place] [address] Phone: [phone]; Fax: [phone]

## 2017-03-04 ENCOUNTER — Ambulatory Visit: Payer: PPO | Admitting: Cardiology

## 2017-03-04 ENCOUNTER — Encounter: Payer: Self-pay | Admitting: Cardiology

## 2017-03-04 VITALS — BP 138/84 | HR 75 | Ht 67.0 in | Wt 169.0 lb

## 2017-03-04 DIAGNOSIS — I9789 Other postprocedural complications and disorders of the circulatory system, not elsewhere classified: Secondary | ICD-10-CM

## 2017-03-04 DIAGNOSIS — E269 Hyperaldosteronism, unspecified: Secondary | ICD-10-CM

## 2017-03-04 DIAGNOSIS — I4891 Unspecified atrial fibrillation: Secondary | ICD-10-CM | POA: Diagnosis not present

## 2017-03-04 DIAGNOSIS — I25119 Atherosclerotic heart disease of native coronary artery with unspecified angina pectoris: Secondary | ICD-10-CM

## 2017-03-04 DIAGNOSIS — E782 Mixed hyperlipidemia: Secondary | ICD-10-CM | POA: Diagnosis not present

## 2017-03-04 NOTE — Patient Instructions (Addendum)
Your physician wants you to follow-up in: 2 months  with Dr.McDowell     STOP Amiodarone   All other medications stay the same.     No lab work or tests ordered today.      Thank you for choosing [facility] !

## 2017-03-14 DIAGNOSIS — N182 Chronic kidney disease, stage 2 (mild): Secondary | ICD-10-CM | POA: Diagnosis not present

## 2017-03-14 DIAGNOSIS — I129 Hypertensive chronic kidney disease with stage 1 through stage 4 chronic kidney disease, or unspecified chronic kidney disease: Secondary | ICD-10-CM | POA: Diagnosis not present

## 2017-03-14 DIAGNOSIS — M1711 Unilateral primary osteoarthritis, right knee: Secondary | ICD-10-CM | POA: Diagnosis not present

## 2017-03-14 DIAGNOSIS — I4891 Unspecified atrial fibrillation: Secondary | ICD-10-CM | POA: Diagnosis not present

## 2017-03-14 DIAGNOSIS — N62 Hypertrophy of breast: Secondary | ICD-10-CM | POA: Diagnosis not present

## 2017-03-14 DIAGNOSIS — I2510 Atherosclerotic heart disease of native coronary artery without angina pectoris: Secondary | ICD-10-CM | POA: Diagnosis not present

## 2017-03-14 DIAGNOSIS — E2609 Other primary hyperaldosteronism: Secondary | ICD-10-CM | POA: Diagnosis not present

## 2017-03-14 DIAGNOSIS — E782 Mixed hyperlipidemia: Secondary | ICD-10-CM | POA: Diagnosis not present

## 2017-03-18 ENCOUNTER — Other Ambulatory Visit: Payer: Self-pay | Admitting: Cardiology

## 2017-03-18 ENCOUNTER — Encounter (HOSPITAL_COMMUNITY)
Admission: RE | Admit: 2017-03-18 | Discharge: 2017-03-18 | Disposition: A | Discharge status: Home / Self Care | Payer: PPO | Source: Ambulatory Visit | Attending: Cardiology | Admitting: Cardiology

## 2017-03-18 VITALS — BP 112/86 | HR 85 | Ht 67.0 in | Wt 161.8 lb

## 2017-03-18 DIAGNOSIS — Z951 Presence of aortocoronary bypass graft: Secondary | ICD-10-CM | POA: Diagnosis not present

## 2017-03-18 DIAGNOSIS — I214 Non-ST elevation (NSTEMI) myocardial infarction: Secondary | ICD-10-CM | POA: Diagnosis not present

## 2017-03-18 NOTE — Progress Notes (Signed)
Cardiac/Pulmonary Rehab Medication Review by a Pharmacist  Does the patient  feel that his/her medications are working for him/her?  yes  Has the patient been experiencing any side effects to the medications prescribed?  no  Does the patient measure his/her own blood pressure or blood glucose at home?  yes   Does the patient have any problems obtaining medications due to transportation or finances?   no  Understanding of regimen: excellent Understanding of indications: excellent Potential of compliance: excellent  Questions asked to Determine Patient Understanding of Medication Regimen:  1. What is the name of the medication?  2. What is the medication used for?  3. When should it be taken?  4. How much should be taken?  5. How will you take it?  6. What side effects should you report?  Understanding Defined as: Excellent: All questions above are correct Good: Questions 1-4 are correct Fair: Questions 1-2 are correct  Poor: 1 or none of the above questions are correct   Pharmacist comments: Pt does not c/o any side effects.  Pt is a retired Music therapist and very familiar with medications and medical procedures.  No problems noted.  Pt did increase Inspra dose due to blood pressures.  This is managed by nephrologist.    Hart Robinsons A 03/18/2017 2:06 PM

## 2017-03-18 NOTE — Progress Notes (Signed)
Cardiac Individual Treatment Plan  Patient Details  Name: Christopher Booth MRN: [id number] Date of Birth: 1940/07/07 Referring Provider:     [place] from 03/18/2017 in [place]  Referring Provider  [name]      Initial Encounter Date:    CARDIAC REHAB PHASE II ORIENTATION from 03/18/2017 in [place]  Date  03/18/17  Referring Provider  [name]      Visit Diagnosis: NSTEMI (non-ST elevated myocardial infarction) ([place])  S/P CABG x 3  Patient's Home Medications on Admission:  Current Outpatient Medications:  .  acetaminophen (TYLENOL) 500 MG tablet, Take 500 mg by mouth every 6 (six) hours as needed for mild pain. , Disp: , Rfl:  .  aspirin EC 81 MG EC tablet, Take 1 tablet (81 mg total) by mouth daily., Disp: , Rfl:  .  atorvastatin (LIPITOR) 20 MG tablet, Take 20 mg by mouth daily., Disp: , Rfl:  .  ELIQUIS 5 MG TABS tablet, TAKE (1) TABLET BY MOUTH TWICE DAILY., Disp: 180 tablet, Rfl: 3 .  eplerenone (INSPRA) 25 MG tablet, Take 1 tablet (25 mg total) by mouth daily. (Patient taking differently: Take 50 mg by mouth 2 (two) times daily. ), Disp: , Rfl:  .  furosemide (LASIX) 20 MG tablet, Take 1 tablet (20 mg total) by mouth as needed (swelling)., Disp: 30 tablet, Rfl: 3 .  hydrochlorothiazide (HYDRODIURIL) 25 MG tablet, Take 25 mg by mouth daily., Disp: , Rfl:  .  metoprolol tartrate (LOPRESSOR) 25 MG tablet, TAKE (1) TABLET BY MOUTH TWICE DAILY., Disp: 180 tablet, Rfl: 3 .  Multiple Vitamins-Minerals (MULTI COMPLETE PO), Take by mouth daily., Disp: , Rfl:   Past Medical History: Past Medical History:  Diagnosis Date  . Arthritis   . 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 Essential hypertension   . Gynecomastia   . Hyperaldosteronism   . Postoperative atrial fibrillation ([place])   . Spinal stenosis   . Temporomandibular joint disease   . Wears  glasses     Tobacco Use: Social History   Tobacco Use  Smoking Status Never Smoker  Smokeless Tobacco Never Used    Labs: Recent Review Flowsheet Data    Labs for ITP Cardiac and Pulmonary Rehab Latest Ref Rng & Units 01/12/2017 01/12/2017 01/12/2017 01/12/2017 01/13/2017   Cholestrol 0 - 200 mg/dL - - - - -   LDLCALC 0 - 99 mg/dL - - - - -   HDL >40 mg/dL - - - - -   Trlycerides <150 mg/dL - - - - -   Hemoglobin A1c 4.8 - 5.6 % - - - - -   PHART 7.350 - 7.450 7.448 7.326(L) 7.371 - -   PCO2ART 32.0 - 48.0 mmHg 29.9(L) 43.1 39.2 - -   HCO3 20.0 - 28.0 mmol/L 20.9 22.5 22.6 - -   TCO2 22 - 32 mmol/L 22 24 24 24 22    ACIDBASEDEF 0.0 - 2.0 mmol/L 3.0(H) 3.0(H) 2.0 - -   O2SAT % 100.0 99.0 100.0 - -      Capillary Blood Glucose: Lab Results  Component Value Date   GLUCAP 107 (H) 01/17/2017   GLUCAP 100 (H) 01/16/2017   GLUCAP 124 (H) 01/16/2017   GLUCAP 106 (H) 01/16/2017   GLUCAP 88 01/16/2017     Exercise Target Goals: Date: 03/18/17  Exercise Program Goal: Individual exercise prescription set using results from initial 6  min walk test and THRR while considering  patient's activity barriers and safety.   Exercise Prescription Goal: Starting with aerobic activity 30 plus minutes a day, 3 days per week for initial exercise prescription. Provide home exercise prescription and guidelines that participant acknowledges understanding prior to discharge.  Activity Barriers & Risk Stratification: Activity Barriers & Cardiac Risk Stratification - 03/18/17 1409      Activity Barriers & Cardiac Risk Stratification   Activity Barriers  Back Problems    Cardiac Risk Stratification  High       6 Minute Walk: 6 Minute Walk    Row Name 03/18/17 1408         6 Minute Walk   Phase  Initial     Distance  1500 feet     Distance % Change  0 %     Distance Feet Change  0 ft     Walk Time  6 minutes     # of Rest Breaks  0     MPH  2.84     METS  3.17     RPE  11      Perceived Dyspnea   9     VO2 Peak  10.71     Symptoms  No     Resting HR  85 bpm     Resting BP  112/86     Resting Oxygen Saturation   96 %     Exercise Oxygen Saturation  during 6 min walk  93 %     Max Ex. HR  99 bpm     Max Ex. BP  134/84     2 Minute Post BP  120/80        Oxygen Initial Assessment: Oxygen Initial Assessment - 03/18/17 1447      Home Oxygen   Home Oxygen Device  None    Sleep Oxygen Prescription  None    Home Exercise Oxygen Prescription  None    Home at Rest Exercise Oxygen Prescription  None      Initial 6 min Walk   Oxygen Used  None      Program Oxygen Prescription   Program Oxygen Prescription  None       Oxygen Re-Evaluation:   Oxygen Discharge (Final Oxygen Re-Evaluation):   Initial Exercise Prescription: Initial Exercise Prescription - 03/18/17 1400      Date of Initial Exercise RX and Referring Provider   Date  03/18/17    Referring Provider  [name]      Treadmill   MPH  1.8    Grade  0    Minutes  15    METs  2.3      Recumbant Elliptical   Level  1    RPM  49    Watts  62    Minutes  20    METs  3.4      Prescription Details   Frequency (times per week)  3    Duration  Progress to 30 minutes of continuous aerobic without signs/symptoms of physical distress      Intensity   THRR 40-80% of Max Heartrate  109-120-132    Ratings of Perceived Exertion  11-13    Perceived Dyspnea  0-4      Progression   Progression  Continue progressive overload as per policy without signs/symptoms or physical distress.      Resistance Training   Training Prescription  Yes    Weight  1  Reps  10-15       Perform Capillary Blood Glucose checks as needed.  Exercise Prescription Changes:   Exercise Comments:   Exercise Goals and Review:  Exercise Goals    Row Name 03/18/17 1409             Exercise Goals   Increase Physical Activity  Yes       Intervention  Provide advice, education, support and counseling  about physical activity/exercise needs.;Develop an individualized exercise prescription for aerobic and resistive training based on initial evaluation findings, risk stratification, comorbidities and participant's personal goals.       Expected Outcomes  Short Term: Attend rehab on a regular basis to increase amount of physical activity.       Increase Strength and Stamina  Yes       Intervention  Provide advice, education, support and counseling about physical activity/exercise needs.;Develop an individualized exercise prescription for aerobic and resistive training based on initial evaluation findings, risk stratification, comorbidities and participant's personal goals.       Expected Outcomes  Short Term: Increase workloads from initial exercise prescription for resistance, speed, and METs.       Able to understand and use rate of perceived exertion (RPE) scale  Yes       Intervention  Provide education and explanation on how to use RPE scale       Expected Outcomes  Short Term: Able to use RPE daily in rehab to express subjective intensity level;Long Term:  Able to use RPE to guide intensity level when exercising independently       Able to understand and use Dyspnea scale  Yes       Intervention  Provide education and explanation on how to use Dyspnea scale       Expected Outcomes  Long Term: Able to use Dyspnea scale to guide intensity level when exercising independently;Short Term: Able to use Dyspnea scale daily in rehab to express subjective sense of shortness of breath during exertion       Knowledge and understanding of Target Heart Rate Range (THRR)  Yes       Intervention  Provide education and explanation of THRR including how the numbers were predicted and where they are located for reference       Expected Outcomes  Short Term: Able to state/look up THRR;Long Term: Able to use THRR to govern intensity when exercising independently;Short Term: Able to use daily as guideline for intensity  in rehab       Able to check pulse independently  Yes       Intervention  Provide education and demonstration on how to check pulse in carotid and radial arteries.;Review the importance of being able to check your own pulse for safety during independent exercise       Expected Outcomes  Short Term: Able to explain why pulse checking is important during independent exercise;Long Term: Able to check pulse independently and accurately       Understanding of Exercise Prescription  Yes       Intervention  Provide education, explanation, and written materials on patient's individual exercise prescription       Expected Outcomes  Short Term: Able to explain program exercise prescription;Long Term: Able to explain home exercise prescription to exercise independently          Exercise Goals Re-Evaluation :    Discharge Exercise Prescription (Final Exercise Prescription Changes):   Nutrition:  Target Goals: Understanding of nutrition guidelines, daily  intake of sodium 1500mg , cholesterol 200mg , calories 30% from fat and 7% or less from saturated fats, daily to have 5 or more servings of fruits and vegetables.  Biometrics: Pre Biometrics - 03/18/17 1431      Pre Biometrics   Height  5\' 7"  (1.702 m)    Weight  161 lb 12.8 oz (73.4 kg)    Waist Circumference  33 inches    Hip Circumference  39 inches    Waist to Hip Ratio  0.85 %    BMI (Calculated)  25.34    Triceps Skinfold  12 mm    % Body Fat  22.8 %    Grip Strength  56.46 kg    Flexibility  0 in    Single Leg Stand  31 seconds        Nutrition Therapy Plan and Nutrition Goals: Nutrition Therapy & Goals - 03/18/17 1440      Personal Nutrition Goals   Personal Goal #2  Patient would like to learn more about how to cook healthy meals. He is following a heart healthy diet low in carbs and sugar.     Additional Goals?  No      Intervention Plan   Intervention  Nutrition handout(s) given to patient.    Expected Outcomes  Short  Term Goal: Understand basic principles of dietary content, such as calories, fat, sodium, cholesterol and nutrients.       Nutrition Assessments: Nutrition Assessments - 03/18/17 1440      MEDFICTS Scores   Pre Score  32       Nutrition Goals Re-Evaluation:   Nutrition Goals Discharge (Final Nutrition Goals Re-Evaluation):   Psychosocial: Target Goals: Acknowledge presence or absence of significant depression and/or stress, maximize coping skills, provide positive support system. Participant is able to verbalize types and ability to use techniques and skills needed for reducing stress and depression.  Initial Review & Psychosocial Screening: Initial Psych Review & Screening - 03/18/17 1436      Initial Review   Current issues with  None Identified      Family Dynamics   Good Support System?  Yes    Comments  Patient has a good support system. He is taking care of his wife who has had a CVA.       Barriers   Psychosocial barriers to participate in program  There are no identifiable barriers or psychosocial needs.      Screening Interventions   Interventions  Encouraged to exercise;Provide feedback about the scores to participant    Expected Outcomes  Long Term goal: The participant improves quality of Life and PHQ9 Scores as seen by post scores and/or verbalization of changes       Quality of Life Scores: Quality of Life - 03/18/17 1409      Quality of Life Scores   Health/Function Pre  23.37 %    Socioeconomic Pre  23 %    Psych/Spiritual Pre  21.79 %    Family Pre  19.33 %    GLOBAL Pre  22.56 %      Scores of 19 and below usually indicate a poorer quality of life in these areas.  A difference of  2-3 points is a clinically meaningful difference.  A difference of 2-3 points in the total score of the Quality of Life Index has been associated with significant improvement in overall quality of life, self-image, physical symptoms, and general health in studies  assessing change in quality of life.  PHQ-9: Recent Review Flowsheet Data    Depression screen [facility] 04-Apr-2022 03/18/2017 02/09/2017   Decreased Interest 0 0   Down, Depressed, Hopeless 0 0   PHQ - 2 Score 0 0   Altered sleeping 1 -   Tired, decreased energy 0 -   Change in appetite 0 -   Feeling bad or failure about yourself  0 -   Trouble concentrating 0 -   Moving slowly or fidgety/restless 0 -   Suicidal thoughts 0 -   PHQ-9 Score 1 -   Difficult doing work/chores Not difficult at all -     Interpretation of Total Score  Total Score Depression Severity:  1-4 = Minimal depression, 5-9 = Mild depression, 10-14 = Moderate depression, 15-19 = Moderately severe depression, 20-27 = Severe depression   Psychosocial Evaluation and Intervention: Psychosocial Evaluation - 03/18/17 1438      Psychosocial Evaluation & Interventions   Interventions  Encouraged to exercise with the program and follow exercise prescription;Stress management education;Relaxation education    Comments  Patient's initial QOL score was 22.56 and his PHQ-9 score was 1. No pychosocial issues identified.     Expected Outcomes  Patient will have no psychosocial issues identified at discharge.     Continue Psychosocial Services   No Follow up required       Psychosocial Re-Evaluation:   Psychosocial Discharge (Final Psychosocial Re-Evaluation):   Vocational Rehabilitation: Provide vocational rehab assistance to qualifying candidates.   Vocational Rehab Evaluation & Intervention: Vocational Rehab - 03/18/17 1433      Initial Vocational Rehab Evaluation & Intervention   Assessment shows need for Vocational Rehabilitation  No       Education: Education Goals: Education classes will be provided on a weekly basis, covering required topics. Participant will state understanding/return demonstration of topics presented.  Learning Barriers/Preferences: Learning Barriers/Preferences - 03/18/17 1433      Learning  Barriers/Preferences   Learning Barriers  None    Learning Preferences  Audio;Written Material;Skilled Demonstration       Education Topics: Hypertension, Hypertension Reduction -Define heart disease and high blood pressure. Discus how high blood pressure affects the body and ways to reduce high blood pressure.   Exercise and Your Heart -Discuss why it is important to exercise, the FITT principles of exercise, normal and abnormal responses to exercise, and how to exercise safely.   Angina -Discuss definition of angina, causes of angina, treatment of angina, and how to decrease risk of having angina.   Cardiac Medications -Review what the following cardiac medications are used for, how they affect the body, and side effects that may occur when taking the medications.  Medications include Aspirin, Beta blockers, calcium channel blockers, ACE Inhibitors, angiotensin receptor blockers, diuretics, digoxin, and antihyperlipidemics.   Congestive Heart Failure -Discuss the definition of CHF, how to live with CHF, the signs and symptoms of CHF, and how keep track of weight and sodium intake.   Heart Disease and Intimacy -Discus the effect sexual activity has on the heart, how changes occur during intimacy as we age, and safety during sexual activity.   Smoking Cessation / COPD -Discuss different methods to quit smoking, the health benefits of quitting smoking, and the definition of COPD.   Nutrition I: Fats -Discuss the types of cholesterol, what cholesterol does to the heart, and how cholesterol levels can be controlled.   Nutrition II: Labels -Discuss the different components of food labels and how to read food label   Heart Parts/Heart Disease and  PAD -Discuss the anatomy of the heart, the pathway of blood circulation through the heart, and these are affected by heart disease.   Stress I: Signs and Symptoms -Discuss the causes of stress, how stress may lead to anxiety and  depression, and ways to limit stress.   Stress II: Relaxation -Discuss different types of relaxation techniques to limit stress.   Warning Signs of Stroke / TIA -Discuss definition of a stroke, what the signs and symptoms are of a stroke, and how to identify when someone is having stroke.   Knowledge Questionnaire Score: Knowledge Questionnaire Score - 03/18/17 1433      Knowledge Questionnaire Score   Pre Score  22/24       Core Components/Risk Factors/Patient Goals at Admission: Personal Goals and Risk Factors at Admission - 03/18/17 1434      Core Components/Risk Factors/Patient Goals on Admission    Weight Management  Weight Maintenance    Hypertension  Yes    Intervention  Provide education on lifestyle modifcations including regular physical activity/exercise, weight management, moderate sodium restriction and increased consumption of fresh fruit, vegetables, and low fat dairy, alcohol moderation, and smoking cessation.;Monitor prescription use compliance.    Expected Outcomes  Short Term: Continued assessment and intervention until BP is < 140/41mm HG in hypertensive participants. < 130/2mm HG in hypertensive participants with diabetes, heart failure or chronic kidney disease.;Long Term: Maintenance of blood pressure at goal levels.    Personal Goal Other  Yes    Personal Goal  Build upper body strength; improve grip strength; no more heart event.     Intervention  Patient will attend CR 3 days/week and suppment with exercise 2 days/week.     Expected Outcomes  Patient will meet his personal goals.        Core Components/Risk Factors/Patient Goals Review:    Core Components/Risk Factors/Patient Goals at Discharge (Final Review):    ITP Comments:   Comments: Patient arrived for 1st visit/orientation/education at 1230. Patient was referred to CR by [facility] due to NSTEMI (I21.4) and CABGX3 (Z95.1). During orientation advised patient on arrival and appointment times  what to wear, what to do before, during and after exercise. Reviewed attendance and class policy. Talked about inclement weather and class consultation policy. Pt is scheduled to return Cardiac Rehab on 03/21/17 at 3:45. Pt was advised to come to class 15 minutes before class starts. Patient was also given instructions on meeting with the dietician and attending the Family Structure classes. Discussed RPE/Dpysnea scales. Discussed initial THR and how to find their radial and/or carotid pulse. Discussed the initial exercise prescription and how this effects their progress. Pt is eager to get started. Patient participated in warm up stretches followed by light weights and resistance bands. Patient was able to complete 6 minute walk test.  Patient was measured for the equipment. Discussed equipment safety with patient. Took patient pre-anthropometric measurements. Patient finished visit at 1430.

## 2017-03-21 ENCOUNTER — Encounter (HOSPITAL_COMMUNITY)
Admission: RE | Admit: 2017-03-21 | Discharge: 2017-03-21 | Disposition: A | Discharge status: Home / Self Care | Payer: PPO | Source: Ambulatory Visit | Attending: Cardiology | Admitting: Cardiology

## 2017-03-21 DIAGNOSIS — I214 Non-ST elevation (NSTEMI) myocardial infarction: Secondary | ICD-10-CM | POA: Diagnosis not present

## 2017-03-21 DIAGNOSIS — Z951 Presence of aortocoronary bypass graft: Secondary | ICD-10-CM

## 2017-03-21 NOTE — Progress Notes (Signed)
Daily Session Note  Patient Details  Name: ELZIA HOTT MRN: [id number] Date of Birth: [birth year] Referring Provider:     [place] from 03/18/2017 in [place]  Referring Provider  [name]      Encounter Date: 03/18/2017  Check In:   Capillary Blood Glucose: No results found for this or any previous visit (from the past 24 hour(s)).    Social History   Tobacco Use  Smoking Status Never Smoker  Smokeless Tobacco Never Used    Goals Met:  Proper associated with RPD/PD & O2 Sat Exercise tolerated well Personal goals reviewed No report of cardiac concerns or symptoms Strength training completed today  Goals Unmet:  Not Applicable  Comments: Check out 1430.   [name] is Medical Director for [facility] Cardiac and Pulmonary Rehab.

## 2017-03-21 NOTE — Progress Notes (Signed)
Daily Session Note  Patient Details  Name: Christopher Booth MRN: [id number] Date of Birth: 12/26/1940 Referring Provider:     [place] from 03/18/2017 in [place]  Referring Provider  [name]      Encounter Date: 03/21/2017  Check In: Session Check In - 03/21/17 1545      Check-In   Location  [facility]    Staff Present  [name], MS, EP, [facility], Exercise Physiologist;Gregory Luther Parody, BS, EP, Exercise Physiologist;Tamas Suen Wynetta Emery, RN, BSN    Supervising physician immediately available to respond to emergencies  See telemetry face sheet for immediately available MD    Medication changes reported      No    Fall or balance concerns reported     No    Warm-up and Cool-down  Performed as group-led instruction    Resistance Training Performed  Yes    VAD Patient?  No      Pain Assessment   Currently in Pain?  No/denies    Pain Score  0-No pain    Multiple Pain Sites  No       Capillary Blood Glucose: No results found for this or any previous visit (from the past 24 hour(s)).    Social History   Tobacco Use  Smoking Status Never Smoker  Smokeless Tobacco Never Used    Goals Met:  Independence with exercise equipment Exercise tolerated well No report of cardiac concerns or symptoms Strength training completed today  Goals Unmet:  Not Applicable  Comments: Check out 1645.   [name] is Medical Director for [facility] Cardiac and Pulmonary Rehab.

## 2017-03-22 NOTE — Progress Notes (Signed)
Patient received the take home exercise plan 03/21/2017. THR was addressed as were safety guidelines for being active when not in CR. Patient demonstrated and understanding and was encouraged to ask any future questions as they arise.

## 2017-03-23 ENCOUNTER — Encounter (HOSPITAL_COMMUNITY)
Admission: RE | Admit: 2017-03-23 | Discharge: 2017-03-23 | Disposition: A | Discharge status: Home / Self Care | Payer: PPO | Source: Ambulatory Visit | Attending: Cardiology | Admitting: Cardiology

## 2017-03-23 DIAGNOSIS — I214 Non-ST elevation (NSTEMI) myocardial infarction: Secondary | ICD-10-CM

## 2017-03-23 DIAGNOSIS — Z951 Presence of aortocoronary bypass graft: Secondary | ICD-10-CM

## 2017-03-23 NOTE — Progress Notes (Signed)
Daily Session Note  Patient Details  Name: Christopher Booth MRN: [id number] Date of Birth: 11/05/1940 Referring Provider:     [place] from 03/18/2017 in [place]  Referring Provider  [name]      Encounter Date: 03/23/2017  Check In: Session Check In - 03/23/17 1545      Check-In   Location  [facility]    Staff Present  [name], MS, EP, [facility], Exercise Physiologist;Gregory Luther Parody, BS, EP, Exercise Physiologist;Shanasia Ibrahim Wynetta Emery, RN, BSN    Supervising physician immediately available to respond to emergencies  See telemetry face sheet for immediately available MD    Medication changes reported      No    Fall or balance concerns reported     No    Warm-up and Cool-down  Performed as group-led instruction    Resistance Training Performed  Yes    VAD Patient?  No      Pain Assessment   Currently in Pain?  No/denies    Pain Score  0-No pain    Multiple Pain Sites  No       Capillary Blood Glucose: No results found for this or any previous visit (from the past 24 hour(s)).    Social History   Tobacco Use  Smoking Status Never Smoker  Smokeless Tobacco Never Used    Goals Met:  Independence with exercise equipment Exercise tolerated well No report of cardiac concerns or symptoms Strength training completed today  Goals Unmet:  Not Applicable  Comments: Check out 1645.   [name] is Medical Director for [facility] Cardiac and Pulmonary Rehab.

## 2017-03-25 ENCOUNTER — Encounter (HOSPITAL_COMMUNITY)
Admission: RE | Admit: 2017-03-25 | Discharge: 2017-03-25 | Disposition: A | Discharge status: Home / Self Care | Payer: PPO | Source: Ambulatory Visit | Attending: Cardiology | Admitting: Cardiology

## 2017-03-25 DIAGNOSIS — I214 Non-ST elevation (NSTEMI) myocardial infarction: Secondary | ICD-10-CM | POA: Diagnosis not present

## 2017-03-25 DIAGNOSIS — Z951 Presence of aortocoronary bypass graft: Secondary | ICD-10-CM

## 2017-03-25 NOTE — Progress Notes (Signed)
Daily Session Note  Patient Details  Name: Christopher Booth MRN: [id number] Date of Birth: 03-05-1940 Referring Provider:     [place] from 03/18/2017 in [place]  Referring Provider  [name]      Encounter Date: 03/25/2017  Check In: Session Check In - 03/25/17 1545      Check-In   Location  [facility]    Staff Present  [name], MS, EP, [facility], Exercise Physiologist;Gregory Luther Parody, BS, EP, Exercise Physiologist;Marshae Azam Wynetta Emery, RN, BSN    Supervising physician immediately available to respond to emergencies  See telemetry face sheet for immediately available MD    Medication changes reported      No    Fall or balance concerns reported     No    Warm-up and Cool-down  Performed as group-led instruction    Resistance Training Performed  Yes    VAD Patient?  No      Pain Assessment   Currently in Pain?  No/denies    Pain Score  0-No pain    Multiple Pain Sites  No       Capillary Blood Glucose: No results found for this or any previous visit (from the past 24 hour(s)).    Social History   Tobacco Use  Smoking Status Never Smoker  Smokeless Tobacco Never Used    Goals Met:  Independence with exercise equipment Exercise tolerated well No report of cardiac concerns or symptoms Strength training completed today  Goals Unmet:  Not Applicable  Comments: Check out 1645.   [name] is Medical Director for [facility] Cardiac and Pulmonary Rehab.

## 2017-03-28 ENCOUNTER — Encounter (HOSPITAL_COMMUNITY)
Admission: RE | Admit: 2017-03-28 | Discharge: 2017-03-28 | Disposition: A | Discharge status: Home / Self Care | Payer: PPO | Source: Ambulatory Visit | Attending: Cardiology | Admitting: Cardiology

## 2017-03-28 DIAGNOSIS — Z951 Presence of aortocoronary bypass graft: Secondary | ICD-10-CM

## 2017-03-28 DIAGNOSIS — I214 Non-ST elevation (NSTEMI) myocardial infarction: Secondary | ICD-10-CM | POA: Diagnosis not present

## 2017-03-28 NOTE — Progress Notes (Signed)
Daily Session Note  Patient Details  Name: Christopher Booth MRN: [id number] Date of Birth: Jun 05, 1940 Referring Provider:     [place] from 03/18/2017 in [place]  Referring Provider  [name]      Encounter Date: 03/28/2017  Check In: Session Check In - 03/28/17 1545      Check-In   Location  [facility]    Staff Present  [name], MS, EP, [facility], Exercise Physiologist;Gregory Luther Parody, BS, EP, Exercise Physiologist;Adonys Wildes Wynetta Emery, RN, BSN    Supervising physician immediately available to respond to emergencies  See telemetry face sheet for immediately available MD    Medication changes reported      No    Fall or balance concerns reported     No    Warm-up and Cool-down  Performed as group-led instruction    Resistance Training Performed  Yes    VAD Patient?  No      Pain Assessment   Currently in Pain?  No/denies    Pain Score  0-No pain    Multiple Pain Sites  No       Capillary Blood Glucose: No results found for this or any previous visit (from the past 24 hour(s)).    Social History   Tobacco Use  Smoking Status Never Smoker  Smokeless Tobacco Never Used    Goals Met:  Independence with exercise equipment Exercise tolerated well No report of cardiac concerns or symptoms Strength training completed today  Goals Unmet:  Not Applicable  Comments: Check out 1645.   [name] is Medical Director for [facility].

## 2017-03-30 ENCOUNTER — Encounter (HOSPITAL_COMMUNITY)
Admission: RE | Admit: 2017-03-30 | Discharge: 2017-03-30 | Disposition: A | Discharge status: Home / Self Care | Payer: PPO | Source: Ambulatory Visit | Attending: Cardiology | Admitting: Cardiology

## 2017-03-30 DIAGNOSIS — I214 Non-ST elevation (NSTEMI) myocardial infarction: Secondary | ICD-10-CM | POA: Diagnosis not present

## 2017-03-30 DIAGNOSIS — Z951 Presence of aortocoronary bypass graft: Secondary | ICD-10-CM

## 2017-03-30 NOTE — Progress Notes (Signed)
Daily Session Note  Patient Details  Name: Christopher Booth MRN: [id number] Date of Birth: [birth year] Referring Provider:     [place] from 03/18/2017 in [place]  Referring Provider  [name]      Encounter Date: 03/30/2017  Check In: Session Check In - 03/30/17 1520      Check-In   Location  [facility]    Staff Present  [name], MS, EP, [facility], Exercise Physiologist;Gregory Luther Parody, BS, EP, Exercise Physiologist;Uzziel Russey Wynetta Emery, RN, BSN    Supervising physician immediately available to respond to emergencies  See telemetry face sheet for immediately available MD    Medication changes reported      No    Fall or balance concerns reported     No    Warm-up and Cool-down  Performed as group-led instruction    Resistance Training Performed  Yes    VAD Patient?  No      Pain Assessment   Currently in Pain?  No/denies    Pain Score  0-No pain    Multiple Pain Sites  No       Capillary Blood Glucose: No results found for this or any previous visit (from the past 24 hour(s)).  Exercise Prescription Changes - 03/30/17 1400      Response to Exercise   Blood Pressure (Admit)  124/70    Blood Pressure (Exercise)  140/92    Blood Pressure (Exit)  110/70    Heart Rate (Admit)  75 bpm    Heart Rate (Exercise)  777 bpm    Heart Rate (Exit)  83 bpm    Rating of Perceived Exertion (Exercise)  11    Duration  Progress to 30 minutes of  aerobic without signs/symptoms of physical distress    Intensity  THRR New [id number]      Progression   Progression  Continue to progress workloads to maintain intensity without signs/symptoms of physical distress.      Resistance Training   Training Prescription  Yes    Weight  1    Reps  10-15      Treadmill   MPH  2.4    Grade  0    Minutes  20    METs  2.84      Recumbant Elliptical   Level  1    RPM  63    Watts  85    Minutes  20    METs  4.5      Home Exercise  Plan   Plans to continue exercise at  Home (comment)    Frequency  Add 2 additional days to program exercise sessions.    Initial Home Exercises Provided  03/21/17       Social History   Tobacco Use  Smoking Status Never Smoker  Smokeless Tobacco Never Used    Goals Met:  Independence with exercise equipment Exercise tolerated well No report of cardiac concerns or symptoms Strength training completed today  Goals Unmet:  Not Applicable  Comments: Check out 1645.   [name] is Medical Director for [facility].

## 2017-03-30 NOTE — Progress Notes (Signed)
Cardiac Individual Treatment Plan  Patient Details  Name: Christopher Booth MRN: [id number] Date of Birth: Jul 28, 1940 Referring Provider:     [place] from 03/18/2017 in [place]  Referring Provider  [name]      Initial Encounter Date:    CARDIAC REHAB PHASE II ORIENTATION from 03/18/2017 in [facility]  Date  03/18/17  Referring Provider  [name]      Visit Diagnosis: NSTEMI (non-ST elevated myocardial infarction) ([place])  S/P CABG x 3  Patient's Home Medications on Admission:  Current Outpatient Medications:  .  acetaminophen (TYLENOL) 500 MG tablet, Take 500 mg by mouth every 6 (six) hours as needed for mild pain. , Disp: , Rfl:  .  aspirin EC 81 MG EC tablet, Take 1 tablet (81 mg total) by mouth daily., Disp: , Rfl:  .  atorvastatin (LIPITOR) 20 MG tablet, Take 20 mg by mouth daily., Disp: , Rfl:  .  ELIQUIS 5 MG TABS tablet, TAKE (1) TABLET BY MOUTH TWICE DAILY., Disp: 180 tablet, Rfl: 3 .  eplerenone (INSPRA) 25 MG tablet, Take 1 tablet (25 mg total) by mouth daily. (Patient taking differently: Take 50 mg by mouth 2 (two) times daily. ), Disp: , Rfl:  .  furosemide (LASIX) 20 MG tablet, Take 1 tablet (20 mg total) by mouth as needed (swelling)., Disp: 30 tablet, Rfl: 3 .  hydrochlorothiazide (HYDRODIURIL) 25 MG tablet, Take 25 mg by mouth daily., Disp: , Rfl:  .  metoprolol tartrate (LOPRESSOR) 25 MG tablet, TAKE (1) TABLET BY MOUTH TWICE DAILY., Disp: 180 tablet, Rfl: 3 .  Multiple Vitamins-Minerals (MULTI COMPLETE PO), Take by mouth daily., Disp: , Rfl:   Past Medical History: Past Medical History:  Diagnosis Date  . Arthritis   . 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 Essential hypertension   . Gynecomastia   . Hyperaldosteronism   . Postoperative atrial fibrillation ([place])   . Spinal stenosis   . Temporomandibular joint disease   . Wears  glasses     Tobacco Use: Social History   Tobacco Use  Smoking Status Never Smoker  Smokeless Tobacco Never Used    Labs: Recent Review Flowsheet Data    Labs for ITP Cardiac and Pulmonary Rehab Latest Ref Rng & Units 01/12/2017 01/12/2017 01/12/2017 01/12/2017 01/13/2017   Cholestrol 0 - 200 mg/dL - - - - -   LDLCALC 0 - 99 mg/dL - - - - -   HDL >40 mg/dL - - - - -   Trlycerides <150 mg/dL - - - - -   Hemoglobin A1c 4.8 - 5.6 % - - - - -   PHART 7.350 - 7.450 7.448 7.326(L) 7.371 - -   PCO2ART 32.0 - 48.0 mmHg 29.9(L) 43.1 39.2 - -   HCO3 20.0 - 28.0 mmol/L 20.9 22.5 22.6 - -   TCO2 22 - 32 mmol/L 22 24 24 24 22    ACIDBASEDEF 0.0 - 2.0 mmol/L 3.0(H) 3.0(H) 2.0 - -   O2SAT % 100.0 99.0 100.0 - -      Capillary Blood Glucose: Lab Results  Component Value Date   GLUCAP 107 (H) 01/17/2017   GLUCAP 100 (H) 01/16/2017   GLUCAP 124 (H) 01/16/2017   GLUCAP 106 (H) 01/16/2017   GLUCAP 88 01/16/2017     Exercise Target Goals:    Exercise Program Goal: Individual exercise prescription set using results from initial 6  min walk test and THRR while considering  patient's activity barriers and safety.   Exercise Prescription Goal: Starting with aerobic activity 30 plus minutes a day, 3 days per week for initial exercise prescription. Provide home exercise prescription and guidelines that participant acknowledges understanding prior to discharge.  Activity Barriers & Risk Stratification: Activity Barriers & Cardiac Risk Stratification - 03/18/17 1409      Activity Barriers & Cardiac Risk Stratification   Activity Barriers  Back Problems    Cardiac Risk Stratification  High       6 Minute Walk: 6 Minute Walk    Row Name 03/18/17 1408         6 Minute Walk   Phase  Initial     Distance  1500 feet     Distance % Change  0 %     Distance Feet Change  0 ft     Walk Time  6 minutes     # of Rest Breaks  0     MPH  2.84     METS  3.17     RPE  11     Perceived  Dyspnea   9     VO2 Peak  10.71     Symptoms  No     Resting HR  85 bpm     Resting BP  112/86     Resting Oxygen Saturation   96 %     Exercise Oxygen Saturation  during 6 min walk  93 %     Max Ex. HR  99 bpm     Max Ex. BP  134/84     2 Minute Post BP  120/80        Oxygen Initial Assessment: Oxygen Initial Assessment - 03/18/17 1447      Home Oxygen   Home Oxygen Device  None    Sleep Oxygen Prescription  None    Home Exercise Oxygen Prescription  None    Home at Rest Exercise Oxygen Prescription  None      Initial 6 min Walk   Oxygen Used  None      Program Oxygen Prescription   Program Oxygen Prescription  None       Oxygen Re-Evaluation:   Oxygen Discharge (Final Oxygen Re-Evaluation):   Initial Exercise Prescription: Initial Exercise Prescription - 03/18/17 1400      Date of Initial Exercise RX and Referring Provider   Date  03/18/17    Referring Provider  [name]      Treadmill   MPH  1.8    Grade  0    Minutes  15    METs  2.3      Recumbant Elliptical   Level  1    RPM  49    Watts  62    Minutes  20    METs  3.4      Prescription Details   Frequency (times per week)  3    Duration  Progress to 30 minutes of continuous aerobic without signs/symptoms of physical distress      Intensity   THRR 40-80% of Max Heartrate  109-120-132    Ratings of Perceived Exertion  11-13    Perceived Dyspnea  0-4      Progression   Progression  Continue progressive overload as per policy without signs/symptoms or physical distress.      Resistance Training   Training Prescription  Yes    Weight  1  Reps  10-15       Perform Capillary Blood Glucose checks as needed.  Exercise Prescription Changes:  Exercise Prescription Changes    Row Name 03/22/17 0700 03/30/17 1400           Response to Exercise   Blood Pressure (Admit)  -  124/70      Blood Pressure (Exercise)  -  140/92      Blood Pressure (Exit)  -  110/70      Heart Rate  (Admit)  -  75 bpm      Heart Rate (Exercise)  -  777 bpm      Heart Rate (Exit)  -  83 bpm      Rating of Perceived Exertion (Exercise)  -  11      Duration  -  Progress to 30 minutes of  aerobic without signs/symptoms of physical distress      Intensity  -  THRR New 103-116-130        Progression   Progression  -  Continue to progress workloads to maintain intensity without signs/symptoms of physical distress.        Resistance Training   Training Prescription  Yes  Yes      Weight  1  1      Reps  10-15  10-15        Treadmill   MPH  1.8  2.4      Grade  0  0      Minutes  15  20      METs  2.3  2.84        Recumbant Elliptical   Level  1  1      RPM  49  63      Watts  62  85      Minutes  20  20      METs  3.4  4.5        Home Exercise Plan   Plans to continue exercise at  Home (comment)  Home (comment)      Frequency  Add 2 additional days to program exercise sessions.  Add 2 additional days to program exercise sessions.      Initial Home Exercises Provided  03/21/17  03/21/17         Exercise Comments:  Exercise Comments    Row Name 03/22/17 0747 03/30/17 1405         Exercise Comments  Patient received the take home exercise plan 03/21/2017. THR was addressed as were safety guidelines for being active when not in CR. Patient demonstrated and understanding and was encouraged to ask any future questions as they arise.   Patient is doing well in CR and has already progressed his speed on the treadmill as well as his RPMs and watts on the recumbent elliptical. Patient is progressing well and has stated taht he feels well in the program. We are working towards gaining the patients goals.          Exercise Goals and Review:  Exercise Goals    Row Name 03/18/17 1409             Exercise Goals   Increase Physical Activity  Yes       Intervention  Provide advice, education, support and counseling about physical activity/exercise needs.;Develop an individualized  exercise prescription for aerobic and resistive training based on initial evaluation findings, risk stratification, comorbidities and participant's personal goals.  Expected Outcomes  Short Term: Attend rehab on a regular basis to increase amount of physical activity.       Increase Strength and Stamina  Yes       Intervention  Provide advice, education, support and counseling about physical activity/exercise needs.;Develop an individualized exercise prescription for aerobic and resistive training based on initial evaluation findings, risk stratification, comorbidities and participant's personal goals.       Expected Outcomes  Short Term: Increase workloads from initial exercise prescription for resistance, speed, and METs.       Able to understand and use rate of perceived exertion (RPE) scale  Yes       Intervention  Provide education and explanation on how to use RPE scale       Expected Outcomes  Short Term: Able to use RPE daily in rehab to express subjective intensity level;Long Term:  Able to use RPE to guide intensity level when exercising independently       Able to understand and use Dyspnea scale  Yes       Intervention  Provide education and explanation on how to use Dyspnea scale       Expected Outcomes  Long Term: Able to use Dyspnea scale to guide intensity level when exercising independently;Short Term: Able to use Dyspnea scale daily in rehab to express subjective sense of shortness of breath during exertion       Knowledge and understanding of Target Heart Rate Range (THRR)  Yes       Intervention  Provide education and explanation of THRR including how the numbers were predicted and where they are located for reference       Expected Outcomes  Short Term: Able to state/look up THRR;Long Term: Able to use THRR to govern intensity when exercising independently;Short Term: Able to use daily as guideline for intensity in rehab       Able to check pulse independently  Yes        Intervention  Provide education and demonstration on how to check pulse in carotid and radial arteries.;Review the importance of being able to check your own pulse for safety during independent exercise       Expected Outcomes  Short Term: Able to explain why pulse checking is important during independent exercise;Long Term: Able to check pulse independently and accurately       Understanding of Exercise Prescription  Yes       Intervention  Provide education, explanation, and written materials on patient's individual exercise prescription       Expected Outcomes  Short Term: Able to explain program exercise prescription;Long Term: Able to explain home exercise prescription to exercise independently          Exercise Goals Re-Evaluation : Exercise Goals Re-Evaluation    Row Name 03/30/17 1403             Exercise Goal Re-Evaluation   Exercise Goals Review  Increase Physical Activity;Able to understand and use rate of perceived exertion (RPE) scale;Knowledge and understanding of Target Heart Rate Range (THRR);Understanding of Exercise Prescription;Able to understand and use Dyspnea scale       Comments  Patient is doing well in CR and has already progressed his speed on the treadmill as well as his RPMs and watts on the recumbent elliptical. Patient is progressing well and has stated taht he feels well in the program. We are working towards gaining the patients goals.        Expected Outcomes  Patient  wishes to gain upper body strength and to have no more heart events in the future.            Discharge Exercise Prescription (Final Exercise Prescription Changes): Exercise Prescription Changes - 03/30/17 1400      Response to Exercise   Blood Pressure (Admit)  124/70    Blood Pressure (Exercise)  140/92    Blood Pressure (Exit)  110/70    Heart Rate (Admit)  75 bpm    Heart Rate (Exercise)  777 bpm    Heart Rate (Exit)  83 bpm    Rating of Perceived Exertion (Exercise)  11     Duration  Progress to 30 minutes of  aerobic without signs/symptoms of physical distress    Intensity  THRR New 103-116-130      Progression   Progression  Continue to progress workloads to maintain intensity without signs/symptoms of physical distress.      Resistance Training   Training Prescription  Yes    Weight  1    Reps  10-15      Treadmill   MPH  2.4    Grade  0    Minutes  20    METs  2.84      Recumbant Elliptical   Level  1    RPM  63    Watts  85    Minutes  20    METs  4.5      Home Exercise Plan   Plans to continue exercise at  Home (comment)    Frequency  Add 2 additional days to program exercise sessions.    Initial Home Exercises Provided  03/21/17       Nutrition:  Target Goals: Understanding of nutrition guidelines, daily intake of sodium 1500mg , cholesterol 200mg , calories 30% from fat and 7% or less from saturated fats, daily to have 5 or more servings of fruits and vegetables.  Biometrics: Pre Biometrics - 03/18/17 1431      Pre Biometrics   Height  5\' 7"  (1.702 m)    Weight  161 lb 12.8 oz (73.4 kg)    Waist Circumference  33 inches    Hip Circumference  39 inches    Waist to Hip Ratio  0.85 %    BMI (Calculated)  25.34    Triceps Skinfold  12 mm    % Body Fat  22.8 %    Grip Strength  56.46 kg    Flexibility  0 in    Single Leg Stand  31 seconds        Nutrition Therapy Plan and Nutrition Goals: Nutrition Therapy & Goals - 03/18/17 1440      Personal Nutrition Goals   Personal Goal #2  Patient would like to learn more about how to cook healthy meals. He is following a heart healthy diet low in carbs and sugar.     Additional Goals?  No      Intervention Plan   Intervention  Nutrition handout(s) given to patient.    Expected Outcomes  Short Term Goal: Understand basic principles of dietary content, such as calories, fat, sodium, cholesterol and nutrients.       Nutrition Assessments: Nutrition Assessments - 03/18/17 1440       MEDFICTS Scores   Pre Score  32       Nutrition Goals Re-Evaluation:   Nutrition Goals Discharge (Final Nutrition Goals Re-Evaluation):   Psychosocial: Target Goals: Acknowledge presence or absence of significant depression and/or stress,  maximize coping skills, provide positive support system. Participant is able to verbalize types and ability to use techniques and skills needed for reducing stress and depression.  Initial Review & Psychosocial Screening: Initial Psych Review & Screening - 03/18/17 1436      Initial Review   Current issues with  None Identified      Family Dynamics   Good Support System?  Yes    Comments  Patient has a good support system. He is taking care of his wife who has had a CVA.       Barriers   Psychosocial barriers to participate in program  There are no identifiable barriers or psychosocial needs.      Screening Interventions   Interventions  Encouraged to exercise;Provide feedback about the scores to participant    Expected Outcomes  Long Term goal: The participant improves quality of Life and PHQ9 Scores as seen by post scores and/or verbalization of changes       Quality of Life Scores: Quality of Life - 03/18/17 1409      Quality of Life Scores   Health/Function Pre  23.37 %    Socioeconomic Pre  23 %    Psych/Spiritual Pre  21.79 %    Family Pre  19.33 %    GLOBAL Pre  22.56 %      Scores of 19 and below usually indicate a poorer quality of life in these areas.  A difference of  2-3 points is a clinically meaningful difference.  A difference of 2-3 points in the total score of the Quality of Life Index has been associated with significant improvement in overall quality of life, self-image, physical symptoms, and general health in studies assessing change in quality of life.  PHQ-9: Recent Review Flowsheet Data    Depression screen [facility] 03-08-22 03/18/2017 02/09/2017   Decreased Interest 0 0   Down, Depressed, Hopeless 0 0   PHQ - 2  Score 0 0   Altered sleeping 1 -   Tired, decreased energy 0 -   Change in appetite 0 -   Feeling bad or failure about yourself  0 -   Trouble concentrating 0 -   Moving slowly or fidgety/restless 0 -   Suicidal thoughts 0 -   PHQ-9 Score 1 -   Difficult doing work/chores Not difficult at all -     Interpretation of Total Score  Total Score Depression Severity:  1-4 = Minimal depression, 5-9 = Mild depression, 10-14 = Moderate depression, 15-19 = Moderately severe depression, 20-27 = Severe depression   Psychosocial Evaluation and Intervention: Psychosocial Evaluation - 03/18/17 1438      Psychosocial Evaluation & Interventions   Interventions  Encouraged to exercise with the program and follow exercise prescription;Stress management education;Relaxation education    Comments  Patient's initial QOL score was 22.56 and his PHQ-9 score was 1. No pychosocial issues identified.     Expected Outcomes  Patient will have no psychosocial issues identified at discharge.     Continue Psychosocial Services   No Follow up required       Psychosocial Re-Evaluation:   Psychosocial Discharge (Final Psychosocial Re-Evaluation):   Vocational Rehabilitation: Provide vocational rehab assistance to qualifying candidates.   Vocational Rehab Evaluation & Intervention: Vocational Rehab - 03/18/17 1433      Initial Vocational Rehab Evaluation & Intervention   Assessment shows need for Vocational Rehabilitation  No       Education: Education Goals: Education classes will be provided on  a weekly basis, covering required topics. Participant will state understanding/return demonstration of topics presented.  Learning Barriers/Preferences: Learning Barriers/Preferences - 03/18/17 1433      Learning Barriers/Preferences   Learning Barriers  None    Learning Preferences  Audio;Written Material;Skilled Demonstration       Education Topics: Hypertension, Hypertension Reduction -Define heart  disease and high blood pressure. Discus how high blood pressure affects the body and ways to reduce high blood pressure.   Exercise and Your Heart -Discuss why it is important to exercise, the FITT principles of exercise, normal and abnormal responses to exercise, and how to exercise safely.   Angina -Discuss definition of angina, causes of angina, treatment of angina, and how to decrease risk of having angina.   Cardiac Medications -Review what the following cardiac medications are used for, how they affect the body, and side effects that may occur when taking the medications.  Medications include Aspirin, Beta blockers, calcium channel blockers, ACE Inhibitors, angiotensin receptor blockers, diuretics, digoxin, and antihyperlipidemics.   CARDIAC REHAB PHASE II EXERCISE from 03/23/2017 in [place]  Date  03/24/17  Educator  DC  Instruction Review Code  2- Demonstrated Understanding      Congestive Heart Failure -Discuss the definition of CHF, how to live with CHF, the signs and symptoms of CHF, and how keep track of weight and sodium intake.   Heart Disease and Intimacy -Discus the effect sexual activity has on the heart, how changes occur during intimacy as we age, and safety during sexual activity.   Smoking Cessation / COPD -Discuss different methods to quit smoking, the health benefits of quitting smoking, and the definition of COPD.   Nutrition I: Fats -Discuss the types of cholesterol, what cholesterol does to the heart, and how cholesterol levels can be controlled.   Nutrition II: Labels -Discuss the different components of food labels and how to read food label   Heart Parts/Heart Disease and PAD -Discuss the anatomy of the heart, the pathway of blood circulation through the heart, and these are affected by heart disease.   Stress I: Signs and Symptoms -Discuss the causes of stress, how stress may lead to anxiety and depression, and ways  to limit stress.   Stress II: Relaxation -Discuss different types of relaxation techniques to limit stress.   Warning Signs of Stroke / TIA -Discuss definition of a stroke, what the signs and symptoms are of a stroke, and how to identify when someone is having stroke.   Knowledge Questionnaire Score: Knowledge Questionnaire Score - 03/18/17 1433      Knowledge Questionnaire Score   Pre Score  22/24       Core Components/Risk Factors/Patient Goals at Admission: Personal Goals and Risk Factors at Admission - 03/18/17 1434      Core Components/Risk Factors/Patient Goals on Admission    Weight Management  Weight Maintenance    Hypertension  Yes    Intervention  Provide education on lifestyle modifcations including regular physical activity/exercise, weight management, moderate sodium restriction and increased consumption of fresh fruit, vegetables, and low fat dairy, alcohol moderation, and smoking cessation.;Monitor prescription use compliance.    Expected Outcomes  Short Term: Continued assessment and intervention until BP is < 140/54mm HG in hypertensive participants. < 130/70mm HG in hypertensive participants with diabetes, heart failure or chronic kidney disease.;Long Term: Maintenance of blood pressure at goal levels.    Personal Goal Other  Yes    Personal Goal  Build upper body strength; improve  grip strength; no more heart event.     Intervention  Patient will attend CR 3 days/week and suppment with exercise 2 days/week.     Expected Outcomes  Patient will meet his personal goals.        Core Components/Risk Factors/Patient Goals Review:    Core Components/Risk Factors/Patient Goals at Discharge (Final Review):    ITP Comments: ITP Comments    Row Name 03/30/17 1412           ITP Comments  Patient new to program. He has completed 5 sessions. Will continue to monitor for progress.           Comments: ITP 30 Day REVIEW Patient is new to program. He has completed  5 sessions. Will continue to monitor for progress.

## 2017-04-01 ENCOUNTER — Encounter (HOSPITAL_COMMUNITY)
Admission: RE | Admit: 2017-04-01 | Discharge: 2017-04-01 | Disposition: A | Discharge status: Home / Self Care | Payer: PPO | Source: Ambulatory Visit | Attending: Cardiology | Admitting: Cardiology

## 2017-04-01 DIAGNOSIS — I214 Non-ST elevation (NSTEMI) myocardial infarction: Secondary | ICD-10-CM

## 2017-04-01 DIAGNOSIS — Z951 Presence of aortocoronary bypass graft: Secondary | ICD-10-CM

## 2017-04-01 NOTE — Progress Notes (Signed)
Daily Session Note  Patient Details  Name: Christopher Booth MRN: [id number] Date of Birth: March 01, 1940 Referring Provider:     [place] from 03/18/2017 in [place]  Referring Provider  [name]      Encounter Date: 04/01/2017  Check In: Session Check In - 04/01/17 1512      Check-In   Location  [facility]    Staff Present  Suzanne Boron, BS, EP, Exercise Physiologist;Shavette Shoaff Wynetta Emery, RN, BSN    Supervising physician immediately available to respond to emergencies  See telemetry face sheet for immediately available MD    Medication changes reported      No    Fall or balance concerns reported     No    Warm-up and Cool-down  Performed as group-led instruction    Resistance Training Performed  Yes    VAD Patient?  No      Pain Assessment   Currently in Pain?  No/denies    Pain Score  0-No pain    Multiple Pain Sites  No       Capillary Blood Glucose: No results found for this or any previous visit (from the past 24 hour(s)).    Social History   Tobacco Use  Smoking Status Never Smoker  Smokeless Tobacco Never Used    Goals Met:  Independence with exercise equipment Exercise tolerated well No report of cardiac concerns or symptoms Strength training completed today  Goals Unmet:  Not Applicable  Comments: Check out 1645.   [name] is Medical Director for [facility] Cardiac and Pulmonary Rehab.

## 2017-04-04 ENCOUNTER — Encounter (HOSPITAL_COMMUNITY)
Admission: RE | Admit: 2017-04-04 | Discharge: 2017-04-04 | Disposition: A | Discharge status: Home / Self Care | Payer: PPO | Source: Ambulatory Visit | Attending: Cardiology | Admitting: Cardiology

## 2017-04-04 DIAGNOSIS — I214 Non-ST elevation (NSTEMI) myocardial infarction: Secondary | ICD-10-CM | POA: Diagnosis not present

## 2017-04-04 DIAGNOSIS — Z951 Presence of aortocoronary bypass graft: Secondary | ICD-10-CM

## 2017-04-04 NOTE — Progress Notes (Signed)
Daily Session Note  Patient Details  Name: JOHANNA STAFFORD MRN: [id number] Date of Birth: [birth year] Referring Provider:     [place] from 03/18/2017 in [place]  Referring Provider  [name]      Encounter Date: 04/04/2017  Check In: Session Check In - 04/04/17 1524      Check-In   Location  [facility]    Staff Present  [name], MS, EP, [facility], Exercise Physiologist;Vian Fluegel Wynetta Emery, RN, BSN    Supervising physician immediately available to respond to emergencies  See telemetry face sheet for immediately available MD    Medication changes reported      No    Fall or balance concerns reported     No    Warm-up and Cool-down  Performed as group-led instruction    Resistance Training Performed  Yes    VAD Patient?  No      Pain Assessment   Currently in Pain?  No/denies    Pain Score  0-No pain    Multiple Pain Sites  No       Capillary Blood Glucose: No results found for this or any previous visit (from the past 24 hour(s)).    Social History   Tobacco Use  Smoking Status Never Smoker  Smokeless Tobacco Never Used    Goals Met:  Independence with exercise equipment Exercise tolerated well No report of cardiac concerns or symptoms Strength training completed today  Goals Unmet:  Not Applicable  Comments: Check out 1645.   [name] is Medical Director for [facility] Cardiac and Pulmonary Rehab.

## 2017-04-06 ENCOUNTER — Encounter (HOSPITAL_COMMUNITY)
Admission: RE | Admit: 2017-04-06 | Discharge: 2017-04-06 | Disposition: A | Discharge status: Home / Self Care | Payer: PPO | Source: Ambulatory Visit | Attending: Cardiology | Admitting: Cardiology

## 2017-04-06 DIAGNOSIS — Z951 Presence of aortocoronary bypass graft: Secondary | ICD-10-CM

## 2017-04-06 DIAGNOSIS — I214 Non-ST elevation (NSTEMI) myocardial infarction: Secondary | ICD-10-CM

## 2017-04-06 NOTE — Progress Notes (Signed)
Daily Session Note  Patient Details  Name: Christopher Booth MRN: [id number] Date of Birth: [birth year] Referring Provider:     [place] from 03/18/2017 in [place]  Referring Provider  [name]      Encounter Date: 04/06/2017  Check In: Session Check In - 04/06/17 1504      Check-In   Location  [facility]    Staff Present  [name], MS, EP, [facility], Exercise Physiologist;Mykael Trott Wynetta Emery, RN, BSN    Supervising physician immediately available to respond to emergencies  See telemetry face sheet for immediately available MD    Medication changes reported      No    Fall or balance concerns reported     No    Warm-up and Cool-down  Performed as group-led instruction    Resistance Training Performed  Yes    VAD Patient?  No      Pain Assessment   Currently in Pain?  No/denies    Pain Score  0-No pain    Multiple Pain Sites  No       Capillary Blood Glucose: No results found for this or any previous visit (from the past 24 hour(s)).    Social History   Tobacco Use  Smoking Status Never Smoker  Smokeless Tobacco Never Used    Goals Met:  Independence with exercise equipment Exercise tolerated well No report of cardiac concerns or symptoms Strength training completed today  Goals Unmet:  Not Applicable  Comments: Check out 1645.   [name] is Medical Director for [facility] Cardiac and Pulmonary Rehab.

## 2017-04-08 ENCOUNTER — Encounter (HOSPITAL_COMMUNITY)
Admission: RE | Admit: 2017-04-08 | Discharge: 2017-04-08 | Disposition: A | Discharge status: Home / Self Care | Payer: PPO | Source: Ambulatory Visit | Attending: Cardiology | Admitting: Cardiology

## 2017-04-08 DIAGNOSIS — I214 Non-ST elevation (NSTEMI) myocardial infarction: Secondary | ICD-10-CM

## 2017-04-08 DIAGNOSIS — Z951 Presence of aortocoronary bypass graft: Secondary | ICD-10-CM

## 2017-04-08 NOTE — Progress Notes (Signed)
Daily Session Note  Patient Details  Name: Christopher Booth MRN: [id number] Date of Birth: [birth year] Referring Provider:     [place] from 03/18/2017 in [place]  Referring Provider  [name]      Encounter Date: 04/08/2017  Check In: Session Check In - 04/08/17 1523      Check-In   Location  [facility]    Staff Present  Russella Dar, MS, EP, [facility], Exercise Physiologist;Arne Schlender Wynetta Emery, RN, BSN    Supervising physician immediately available to respond to emergencies  See telemetry face sheet for immediately available MD    Medication changes reported      No    Fall or balance concerns reported     No    Warm-up and Cool-down  Performed as group-led instruction    Resistance Training Performed  Yes    VAD Patient?  No      Pain Assessment   Currently in Pain?  No/denies    Pain Score  0-No pain    Multiple Pain Sites  No       Capillary Blood Glucose: No results found for this or any previous visit (from the past 24 hour(s)).    Social History   Tobacco Use  Smoking Status Never Smoker  Smokeless Tobacco Never Used    Goals Met:  Independence with exercise equipment Exercise tolerated well No report of cardiac concerns or symptoms Strength training completed today  Goals Unmet:  Not Applicable  Comments: Check out 1645.   [name] is Medical Director for [facility] Cardiac and Pulmonary Rehab.

## 2017-04-11 ENCOUNTER — Encounter (HOSPITAL_COMMUNITY)
Admission: RE | Admit: 2017-04-11 | Discharge: 2017-04-11 | Disposition: A | Discharge status: Home / Self Care | Payer: PPO | Source: Ambulatory Visit | Attending: Cardiology | Admitting: Cardiology

## 2017-04-11 DIAGNOSIS — I214 Non-ST elevation (NSTEMI) myocardial infarction: Secondary | ICD-10-CM

## 2017-04-11 DIAGNOSIS — Z951 Presence of aortocoronary bypass graft: Secondary | ICD-10-CM

## 2017-04-11 NOTE — Progress Notes (Signed)
Daily Session Note  Patient Details  Name: JP EASTHAM MRN: [id number] Date of Birth: October 28, 1940 Referring Provider:     [place] from 03/18/2017 in [place]  Referring Provider  [name]      Encounter Date: 04/11/2017  Check In: Session Check In - 04/11/17 1519      Check-In   Location  [facility]    Staff Present  [name], MS, EP, [facility], Exercise Physiologist;Hriday Stai Wynetta Emery, RN, BSN    Supervising physician immediately available to respond to emergencies  See telemetry face sheet for immediately available MD    Medication changes reported      No    Fall or balance concerns reported     No    Warm-up and Cool-down  Performed as group-led instruction    Resistance Training Performed  Yes    VAD Patient?  No      Pain Assessment   Currently in Pain?  No/denies    Pain Score  0-No pain    Multiple Pain Sites  No       Capillary Blood Glucose: No results found for this or any previous visit (from the past 24 hour(s)).    Social History   Tobacco Use  Smoking Status Never Smoker  Smokeless Tobacco Never Used    Goals Met:  Independence with exercise equipment Exercise tolerated well No report of cardiac concerns or symptoms Strength training completed today  Goals Unmet:  Not Applicable  Comments: Check out 1645.   [name] is Medical Director for [facility] Cardiac and Pulmonary Rehab.

## 2017-04-13 ENCOUNTER — Encounter (HOSPITAL_COMMUNITY)
Admission: RE | Admit: 2017-04-13 | Discharge: 2017-04-13 | Disposition: A | Discharge status: Home / Self Care | Payer: PPO | Source: Ambulatory Visit | Attending: Cardiology | Admitting: Cardiology

## 2017-04-13 DIAGNOSIS — I214 Non-ST elevation (NSTEMI) myocardial infarction: Secondary | ICD-10-CM | POA: Diagnosis not present

## 2017-04-13 DIAGNOSIS — Z951 Presence of aortocoronary bypass graft: Secondary | ICD-10-CM

## 2017-04-13 NOTE — Progress Notes (Signed)
Daily Session Note  Patient Details  Name: KYROS SALZWEDEL MRN: [id number] Date of Birth: [birth year] Referring Provider:     [place] from 03/18/2017 in [place]  Referring Provider  [name]      Encounter Date: 04/13/2017  Check In: Session Check In - 04/13/17 1524      Check-In   Location  [facility]    Staff Present  [name], MS, EP, [facility], Exercise Physiologist;Veer Elamin Wynetta Emery, RN, BSN    Supervising physician immediately available to respond to emergencies  See telemetry face sheet for immediately available MD    Medication changes reported      No    Fall or balance concerns reported     No    Warm-up and Cool-down  Performed as group-led instruction    Resistance Training Performed  Yes    VAD Patient?  No      Pain Assessment   Currently in Pain?  No/denies    Pain Score  0-No pain    Multiple Pain Sites  No       Capillary Blood Glucose: No results found for this or any previous visit (from the past 24 hour(s)).    Social History   Tobacco Use  Smoking Status Never Smoker  Smokeless Tobacco Never Used    Goals Met:  Independence with exercise equipment Exercise tolerated well No report of cardiac concerns or symptoms Strength training completed today  Goals Unmet:  Not Applicable  Comments: Check out 1645.   [name] is Medical Director for [facility] Cardiac and Pulmonary Rehab.

## 2017-04-15 ENCOUNTER — Encounter (HOSPITAL_COMMUNITY)
Admission: RE | Admit: 2017-04-15 | Discharge: 2017-04-15 | Disposition: A | Discharge status: Home / Self Care | Payer: PPO | Source: Ambulatory Visit | Attending: Cardiology | Admitting: Cardiology

## 2017-04-15 DIAGNOSIS — I214 Non-ST elevation (NSTEMI) myocardial infarction: Secondary | ICD-10-CM | POA: Diagnosis not present

## 2017-04-15 DIAGNOSIS — Z951 Presence of aortocoronary bypass graft: Secondary | ICD-10-CM

## 2017-04-15 NOTE — Progress Notes (Signed)
Daily Session Note  Patient Details  Name: Christopher Booth MRN: [id number] Date of Birth: [birth year] Referring Provider:     [place] from 03/18/2017 in [place]  Referring Provider  [name]      Encounter Date: 04/15/2017  Check In: Session Check In - 04/15/17 1545      Check-In   Location  [facility]    Staff Present  [name], MS, EP, [facility], Exercise Physiologist;Micaylah Bertucci Wynetta Emery, RN, BSN    Supervising physician immediately available to respond to emergencies  See telemetry face sheet for immediately available MD    Medication changes reported      No    Fall or balance concerns reported     No    Warm-up and Cool-down  Performed as group-led instruction    Resistance Training Performed  Yes    VAD Patient?  No      Pain Assessment   Currently in Pain?  No/denies    Pain Score  0-No pain    Multiple Pain Sites  No       Capillary Blood Glucose: No results found for this or any previous visit (from the past 24 hour(s)).    Social History   Tobacco Use  Smoking Status Never Smoker  Smokeless Tobacco Never Used    Goals Met:  Independence with exercise equipment Exercise tolerated well No report of cardiac concerns or symptoms Strength training completed today  Goals Unmet:  Not Applicable  Comments: Check out 1645.   [name] is Medical Director for [facility] Cardiac and Pulmonary Rehab.

## 2017-04-18 ENCOUNTER — Encounter (HOSPITAL_COMMUNITY)
Admission: RE | Admit: 2017-04-18 | Discharge: 2017-04-18 | Disposition: A | Discharge status: Home / Self Care | Payer: PPO | Source: Ambulatory Visit | Attending: Cardiology | Admitting: Cardiology

## 2017-04-18 DIAGNOSIS — I214 Non-ST elevation (NSTEMI) myocardial infarction: Secondary | ICD-10-CM

## 2017-04-18 DIAGNOSIS — Z951 Presence of aortocoronary bypass graft: Secondary | ICD-10-CM

## 2017-04-18 NOTE — Progress Notes (Signed)
Daily Session Note  Patient Details  Name: Christopher Booth MRN: [id number] Date of Birth: 01/16/1941 Referring Provider:     [place] from 03/18/2017 in [place]  Referring Provider  [name]      Encounter Date: 04/18/2017  Check In: Session Check In - 04/18/17 1545      Check-In   Location  [facility]    Staff Present  [name], MS, EP, [facility], Exercise Physiologist;Gleb Mcguire Wynetta Emery, RN, BSN    Supervising physician immediately available to respond to emergencies  See telemetry face sheet for immediately available MD    Medication changes reported      No    Fall or balance concerns reported     No    Warm-up and Cool-down  Performed as group-led instruction    Resistance Training Performed  Yes    VAD Patient?  No      Pain Assessment   Currently in Pain?  No/denies    Pain Score  0-No pain    Multiple Pain Sites  No       Capillary Blood Glucose: No results found for this or any previous visit (from the past 24 hour(s)).    Social History   Tobacco Use  Smoking Status Never Smoker  Smokeless Tobacco Never Used    Goals Met:  Independence with exercise equipment Exercise tolerated well No report of cardiac concerns or symptoms Strength training completed today  Goals Unmet:  Not Applicable  Comments: Check out 1645.   [name] is Medical Director for [facility] Cardiac and Pulmonary Rehab.

## 2017-04-20 ENCOUNTER — Encounter (HOSPITAL_COMMUNITY)
Admission: RE | Admit: 2017-04-20 | Discharge: 2017-04-20 | Disposition: A | Discharge status: Home / Self Care | Payer: PPO | Source: Ambulatory Visit | Attending: Cardiology | Admitting: Cardiology

## 2017-04-20 DIAGNOSIS — Z951 Presence of aortocoronary bypass graft: Secondary | ICD-10-CM

## 2017-04-20 DIAGNOSIS — I214 Non-ST elevation (NSTEMI) myocardial infarction: Secondary | ICD-10-CM

## 2017-04-20 NOTE — Progress Notes (Signed)
Daily Session Note  Patient Details  Name: Christopher Booth MRN: [id number] Date of Birth: 06-Aug-1940 Referring Provider:     [place] from 03/18/2017 in [place]  Referring Provider  [name]      Encounter Date: 04/20/2017  Check In: Session Check In - 04/20/17 1545      Check-In   Location  [facility]    Staff Present  [name], MS, EP, [facility], Exercise Physiologist;Zyona Pettaway Wynetta Emery, RN, BSN    Supervising physician immediately available to respond to emergencies  See telemetry face sheet for immediately available MD    Medication changes reported      No    Fall or balance concerns reported     No    Warm-up and Cool-down  Performed as group-led instruction    Resistance Training Performed  Yes    VAD Patient?  No      Pain Assessment   Currently in Pain?  No/denies    Pain Score  0-No pain    Multiple Pain Sites  No       Capillary Blood Glucose: No results found for this or any previous visit (from the past 24 hour(s)).    Social History   Tobacco Use  Smoking Status Never Smoker  Smokeless Tobacco Never Used    Goals Met:  Independence with exercise equipment Exercise tolerated well No report of cardiac concerns or symptoms Strength training completed today  Goals Unmet:  Not Applicable  Comments: Check out 1645.   [name] is Medical Director for [facility] Cardiac and Pulmonary Rehab.

## 2017-04-22 ENCOUNTER — Encounter (HOSPITAL_COMMUNITY)
Admission: RE | Admit: 2017-04-22 | Discharge: 2017-04-22 | Disposition: A | Discharge status: Home / Self Care | Payer: PPO | Source: Ambulatory Visit | Attending: Cardiology | Admitting: Cardiology

## 2017-04-22 DIAGNOSIS — Z951 Presence of aortocoronary bypass graft: Secondary | ICD-10-CM

## 2017-04-22 DIAGNOSIS — I214 Non-ST elevation (NSTEMI) myocardial infarction: Secondary | ICD-10-CM

## 2017-04-22 NOTE — Progress Notes (Signed)
Daily Session Note  Patient Details  Name: Christopher Booth MRN: [id number] Date of Birth: 01-Nov-1940 Referring Provider:     [place] from 03/18/2017 in [place]  Referring Provider  [name]      Encounter Date: 04/22/2017  Check In: Session Check In - 04/22/17 1545      Check-In   Location  [facility]    Staff Present  [name], MS, EP, [facility], Exercise Physiologist;Tasheena Wambolt Wynetta Emery, RN, BSN    Supervising physician immediately available to respond to emergencies  See telemetry face sheet for immediately available MD    Medication changes reported      No    Fall or balance concerns reported     No    Warm-up and Cool-down  Performed as group-led instruction    Resistance Training Performed  Yes    VAD Patient?  No      Pain Assessment   Currently in Pain?  No/denies    Pain Score  0-No pain    Multiple Pain Sites  No       Capillary Blood Glucose: No results found for this or any previous visit (from the past 24 hour(s)).    Social History   Tobacco Use  Smoking Status Never Smoker  Smokeless Tobacco Never Used    Goals Met:  Independence with exercise equipment Exercise tolerated well No report of cardiac concerns or symptoms Strength training completed today  Goals Unmet:  Not Applicable  Comments: Check out 1645.   [name] is Medical Director for [facility] Cardiac and Pulmonary Rehab.

## 2017-04-25 ENCOUNTER — Encounter (HOSPITAL_COMMUNITY)
Admission: RE | Admit: 2017-04-25 | Discharge: 2017-04-25 | Disposition: A | Discharge status: Home / Self Care | Payer: PPO | Source: Ambulatory Visit | Attending: Cardiology | Admitting: Cardiology

## 2017-04-25 DIAGNOSIS — I214 Non-ST elevation (NSTEMI) myocardial infarction: Secondary | ICD-10-CM

## 2017-04-25 DIAGNOSIS — Z951 Presence of aortocoronary bypass graft: Secondary | ICD-10-CM | POA: Diagnosis not present

## 2017-04-25 NOTE — Progress Notes (Signed)
Daily Session Note  Patient Details  Name: Christopher Booth MRN: [id number] Date of Birth: 1941/01/21 Referring Provider:     [place] from 03/18/2017 in [place]  Referring Provider  [name]      Encounter Date: 04/25/2017  Check In: Session Check In - 04/25/17 1545      Check-In   Location  [facility]    Staff Present  Aundra Dubin, RN, BSN;Gregory Luther Parody, BS, EP, Exercise Physiologist    Supervising physician immediately available to respond to emergencies  See telemetry face sheet for immediately available MD    Medication changes reported      No    Fall or balance concerns reported     No    Warm-up and Cool-down  Performed as group-led instruction    Resistance Training Performed  Yes    VAD Patient?  No      Pain Assessment   Currently in Pain?  No/denies    Pain Score  0-No pain    Multiple Pain Sites  No       Capillary Blood Glucose: No results found for this or any previous visit (from the past 24 hour(s)).    Social History   Tobacco Use  Smoking Status Never Smoker  Smokeless Tobacco Never Used    Goals Met:  Independence with exercise equipment Exercise tolerated well No report of cardiac concerns or symptoms Strength training completed today  Goals Unmet:  Not Applicable  Comments: Check out 1645.   [name] is Medical Director for [facility] Cardiac and Pulmonary Rehab.

## 2017-04-25 NOTE — Progress Notes (Signed)
Cardiac Individual Treatment Plan  Patient Details  Name: Christopher Booth MRN: [id number] Date of Birth: 30-Jul-1940 Referring Provider:     [place] from 03/18/2017 in [place]  Referring Provider  [name]      Initial Encounter Date:    CARDIAC REHAB PHASE II ORIENTATION from 03/18/2017 in [place]  Date  03/18/17  Referring Provider  [name]      Visit Diagnosis: NSTEMI (non-ST elevated myocardial infarction) ([place])  S/P CABG x 3  Patient's Home Medications on Admission:  Current Outpatient Medications:  .  acetaminophen (TYLENOL) 500 MG tablet, Take 500 mg by mouth every 6 (six) hours as needed for mild pain. , Disp: , Rfl:  .  aspirin EC 81 MG EC tablet, Take 1 tablet (81 mg total) by mouth daily., Disp: , Rfl:  .  atorvastatin (LIPITOR) 20 MG tablet, Take 20 mg by mouth daily., Disp: , Rfl:  .  ELIQUIS 5 MG TABS tablet, TAKE (1) TABLET BY MOUTH TWICE DAILY., Disp: 180 tablet, Rfl: 3 .  eplerenone (INSPRA) 25 MG tablet, Take 1 tablet (25 mg total) by mouth daily. (Patient taking differently: Take 50 mg by mouth 2 (two) times daily. ), Disp: , Rfl:  .  furosemide (LASIX) 20 MG tablet, Take 1 tablet (20 mg total) by mouth as needed (swelling)., Disp: 30 tablet, Rfl: 3 .  hydrochlorothiazide (HYDRODIURIL) 25 MG tablet, Take 25 mg by mouth daily., Disp: , Rfl:  .  metoprolol tartrate (LOPRESSOR) 25 MG tablet, TAKE (1) TABLET BY MOUTH TWICE DAILY., Disp: 180 tablet, Rfl: 3 .  Multiple Vitamins-Minerals (MULTI COMPLETE PO), Take by mouth daily., Disp: , Rfl:   Past Medical History: Past Medical History:  Diagnosis Date  . Arthritis   . 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 Essential hypertension   . Gynecomastia   . Hyperaldosteronism   . Postoperative atrial fibrillation ([place])   . Spinal stenosis   . Temporomandibular joint disease   . Wears  glasses     Tobacco Use: Social History   Tobacco Use  Smoking Status Never Smoker  Smokeless Tobacco Never Used    Labs: Recent Review Flowsheet Data    Labs for ITP Cardiac and Pulmonary Rehab Latest Ref Rng & Units 01/12/2017 01/12/2017 01/12/2017 01/12/2017 01/13/2017   Cholestrol 0 - 200 mg/dL - - - - -   LDLCALC 0 - 99 mg/dL - - - - -   HDL >40 mg/dL - - - - -   Trlycerides <150 mg/dL - - - - -   Hemoglobin A1c 4.8 - 5.6 % - - - - -   PHART 7.350 - 7.450 7.448 7.326(L) 7.371 - -   PCO2ART 32.0 - 48.0 mmHg 29.9(L) 43.1 39.2 - -   HCO3 20.0 - 28.0 mmol/L 20.9 22.5 22.6 - -   TCO2 22 - 32 mmol/L 22 24 24 24 22    ACIDBASEDEF 0.0 - 2.0 mmol/L 3.0(H) 3.0(H) 2.0 - -   O2SAT % 100.0 99.0 100.0 - -      Capillary Blood Glucose: Lab Results  Component Value Date   GLUCAP 107 (H) 01/17/2017   GLUCAP 100 (H) 01/16/2017   GLUCAP 124 (H) 01/16/2017   GLUCAP 106 (H) 01/16/2017   GLUCAP 88 01/16/2017     Exercise Target Goals:    Exercise Program Goal: Individual exercise prescription set using results from initial 6  min walk test and THRR while considering  patient's activity barriers and safety.   Exercise Prescription Goal: Starting with aerobic activity 30 plus minutes a day, 3 days per week for initial exercise prescription. Provide home exercise prescription and guidelines that participant acknowledges understanding prior to discharge.  Activity Barriers & Risk Stratification: Activity Barriers & Cardiac Risk Stratification - 03/18/17 1409      Activity Barriers & Cardiac Risk Stratification   Activity Barriers  Back Problems    Cardiac Risk Stratification  High       6 Minute Walk: 6 Minute Walk    Row Name 03/18/17 1408         6 Minute Walk   Phase  Initial     Distance  1500 feet     Distance % Change  0 %     Distance Feet Change  0 ft     Walk Time  6 minutes     # of Rest Breaks  0     MPH  2.84     METS  3.17     RPE  11     Perceived  Dyspnea   9     VO2 Peak  10.71     Symptoms  No     Resting HR  85 bpm     Resting BP  112/86     Resting Oxygen Saturation   96 %     Exercise Oxygen Saturation  during 6 min walk  93 %     Max Ex. HR  99 bpm     Max Ex. BP  134/84     2 Minute Post BP  120/80        Oxygen Initial Assessment: Oxygen Initial Assessment - 03/18/17 1447      Home Oxygen   Home Oxygen Device  None    Sleep Oxygen Prescription  None    Home Exercise Oxygen Prescription  None    Home at Rest Exercise Oxygen Prescription  None      Initial 6 min Walk   Oxygen Used  None      Program Oxygen Prescription   Program Oxygen Prescription  None       Oxygen Re-Evaluation:   Oxygen Discharge (Final Oxygen Re-Evaluation):   Initial Exercise Prescription: Initial Exercise Prescription - 03/18/17 1400      Date of Initial Exercise RX and Referring Provider   Date  03/18/17    Referring Provider  [name]      Treadmill   MPH  1.8    Grade  0    Minutes  15    METs  2.3      Recumbant Elliptical   Level  1    RPM  49    Watts  62    Minutes  20    METs  3.4      Prescription Details   Frequency (times per week)  3    Duration  Progress to 30 minutes of continuous aerobic without signs/symptoms of physical distress      Intensity   THRR 40-80% of Max Heartrate  109-120-132    Ratings of Perceived Exertion  11-13    Perceived Dyspnea  0-4      Progression   Progression  Continue progressive overload as per policy without signs/symptoms or physical distress.      Resistance Training   Training Prescription  Yes    Weight  1  Reps  10-15       Perform Capillary Blood Glucose checks as needed.  Exercise Prescription Changes:  Exercise Prescription Changes    Row Name 03/22/17 0700 03/30/17 1400 04/12/17 0800 04/21/17 1400       Response to Exercise   Blood Pressure (Admit)  -  124/70  104/70  110/66    Blood Pressure (Exercise)  -  140/92  120/80  130/70    Blood  Pressure (Exit)  -  110/70  110/62  90/50    Heart Rate (Admit)  -  75 bpm  72 bpm  92 bpm    Heart Rate (Exercise)  -  777 bpm  91 bpm  115 bpm    Heart Rate (Exit)  -  83 bpm  81 bpm  80 bpm    Rating of Perceived Exertion (Exercise)  -  11  10  12     Duration  -  Progress to 30 minutes of  aerobic without signs/symptoms of physical distress  Progress to 30 minutes of  aerobic without signs/symptoms of physical distress  Progress to 30 minutes of  aerobic without signs/symptoms of physical distress    Intensity  -  THRR New 103-116-130  THRR New 101-115-130  THRR unchanged 101-115-130      Progression   Progression  -  Continue to progress workloads to maintain intensity without signs/symptoms of physical distress.  Continue to progress workloads to maintain intensity without signs/symptoms of physical distress.  Continue to progress workloads to maintain intensity without signs/symptoms of physical distress.      Resistance Training   Training Prescription  Yes  Yes  Yes  Yes    Weight  1  1  2  2     Reps  10-15  10-15  10-15  10-15      Treadmill   MPH  1.8  2.4  2.9  3    Grade  0  0  0  0    Minutes  15  20  20  20     METs  2.3  2.84  3.22  3.29      Recumbant Elliptical   Level  1  1  1   -    RPM  49  63  60  -    Watts  62  85  90  -    Minutes  20  20  20   -    METs  3.4  4.5  5.2  -      Elliptical   Level  -  -  -  1    Speed  -  -  -  35    Minutes  -  -  -  15    METs  -  -  -  2.9      Home Exercise Plan   Plans to continue exercise at  Home (comment)  Home (comment)  Home (comment)  Home (comment)    Frequency  Add 2 additional days to program exercise sessions.  Add 2 additional days to program exercise sessions.  Add 2 additional days to program exercise sessions.  Add 2 additional days to program exercise sessions.    Initial Home Exercises Provided  03/21/17  03/21/17  03/21/17  03/21/17       Exercise Comments:  Exercise Comments    Row Name 03/22/17  0747 03/30/17 1405 04/12/17 0829 04/21/17 1501     Exercise Comments  Patient received the  take home exercise plan 03/21/2017. THR was addressed as were safety guidelines for being active when not in CR. Patient demonstrated and understanding and was encouraged to ask any future questions as they arise.   Patient is doing well in CR and has already progressed his speed on the treadmill as well as his RPMs and watts on the recumbent elliptical. Patient is progressing well and has stated taht he feels well in the program. We are working towards gaining the patients goals.   Patient is doing well in CR and has increased substaintially on the treadmill to 2.44mph and has maintained his level on the recumbent elliptical. Patient states that he still feels well in the program as we continue to work towards patients goals.   Patient is doing very well in CR and has increased his speed on the treadmill. Patient also has been moved from the recumbent elliptical to the stand up elliptical in order for a harder workout. Patient has been doing well on the stand up version of the elliptical. Patient states that he has gained stamina since beginning the program.        Exercise Goals and Review:  Exercise Goals    Row Name 03/18/17 1409             Exercise Goals   Increase Physical Activity  Yes       Intervention  Provide advice, education, support and counseling about physical activity/exercise needs.;Develop an individualized exercise prescription for aerobic and resistive training based on initial evaluation findings, risk stratification, comorbidities and participant's personal goals.       Expected Outcomes  Short Term: Attend rehab on a regular basis to increase amount of physical activity.       Increase Strength and Stamina  Yes       Intervention  Provide advice, education, support and counseling about physical activity/exercise needs.;Develop an individualized exercise prescription for aerobic and  resistive training based on initial evaluation findings, risk stratification, comorbidities and participant's personal goals.       Expected Outcomes  Short Term: Increase workloads from initial exercise prescription for resistance, speed, and METs.       Able to understand and use rate of perceived exertion (RPE) scale  Yes       Intervention  Provide education and explanation on how to use RPE scale       Expected Outcomes  Short Term: Able to use RPE daily in rehab to express subjective intensity level;Long Term:  Able to use RPE to guide intensity level when exercising independently       Able to understand and use Dyspnea scale  Yes       Intervention  Provide education and explanation on how to use Dyspnea scale       Expected Outcomes  Long Term: Able to use Dyspnea scale to guide intensity level when exercising independently;Short Term: Able to use Dyspnea scale daily in rehab to express subjective sense of shortness of breath during exertion       Knowledge and understanding of Target Heart Rate Range (THRR)  Yes       Intervention  Provide education and explanation of THRR including how the numbers were predicted and where they are located for reference       Expected Outcomes  Short Term: Able to state/look up THRR;Long Term: Able to use THRR to govern intensity when exercising independently;Short Term: Able to use daily as guideline for intensity in rehab  Able to check pulse independently  Yes       Intervention  Provide education and demonstration on how to check pulse in carotid and radial arteries.;Review the importance of being able to check your own pulse for safety during independent exercise       Expected Outcomes  Short Term: Able to explain why pulse checking is important during independent exercise;Long Term: Able to check pulse independently and accurately       Understanding of Exercise Prescription  Yes       Intervention  Provide education, explanation, and written  materials on patient's individual exercise prescription       Expected Outcomes  Short Term: Able to explain program exercise prescription;Long Term: Able to explain home exercise prescription to exercise independently          Exercise Goals Re-Evaluation : Exercise Goals Re-Evaluation    [place] Name 03/30/17 1403 04/21/17 1459           Exercise Goal Re-Evaluation   Exercise Goals Review  Increase Physical Activity;Able to understand and use rate of perceived exertion (RPE) scale;Knowledge and understanding of Target Heart Rate Range (THRR);Understanding of Exercise Prescription;Able to understand and use Dyspnea scale  Increase Physical Activity;Able to understand and use rate of perceived exertion (RPE) scale;Knowledge and understanding of Target Heart Rate Range (THRR);Understanding of Exercise Prescription;Able to understand and use Dyspnea scale      Comments  Patient is doing well in CR and has already progressed his speed on the treadmill as well as his RPMs and watts on the recumbent elliptical. Patient is progressing well and has stated taht he feels well in the program. We are working towards gaining the patients goals.   Patient is doing very well in CR and has increased his speed on the treadmill. Patient also has been moved from the recumbent elliptical to the stand up elliptical in order for a harder workout. Patient has been doing well on the stand up version of the elliptical. Patient states that he has gained stamina since beginning the program.       Expected Outcomes  Patient wishes to gain upper body strength and to have no more heart events in the future.   Patient wishes to gain upper body strength and to have no more heart events in the future.           Discharge Exercise Prescription (Final Exercise Prescription Changes): Exercise Prescription Changes - 04/21/17 1400      Response to Exercise   Blood Pressure (Admit)  110/66    Blood Pressure (Exercise)  130/70     Blood Pressure (Exit)  90/50    Heart Rate (Admit)  92 bpm    Heart Rate (Exercise)  115 bpm    Heart Rate (Exit)  80 bpm    Rating of Perceived Exertion (Exercise)  12    Duration  Progress to 30 minutes of  aerobic without signs/symptoms of physical distress    Intensity  THRR unchanged 101-115-130      Progression   Progression  Continue to progress workloads to maintain intensity without signs/symptoms of physical distress.      Resistance Training   Training Prescription  Yes    Weight  2    Reps  10-15      Treadmill   MPH  3    Grade  0    Minutes  20    METs  3.29      Recumbant Elliptical  Level  --    RPM  --    Watts  --    Minutes  --    METs  --      Elliptical   Level  1    Speed  35    Minutes  15    METs  2.9      Home Exercise Plan   Plans to continue exercise at  Home (comment)    Frequency  Add 2 additional days to program exercise sessions.    Initial Home Exercises Provided  03/21/17       Nutrition:  Target Goals: Understanding of nutrition guidelines, daily intake of sodium 1500mg , cholesterol 200mg , calories 30% from fat and 7% or less from saturated fats, daily to have 5 or more servings of fruits and vegetables.  Biometrics: Pre Biometrics - 03/18/17 1431      Pre Biometrics   Height  5\' 7"  (1.702 m)    Weight  161 lb 12.8 oz (73.4 kg)    Waist Circumference  33 inches    Hip Circumference  39 inches    Waist to Hip Ratio  0.85 %    BMI (Calculated)  25.34    Triceps Skinfold  12 mm    % Body Fat  22.8 %    Grip Strength  56.46 kg    Flexibility  0 in    Single Leg Stand  31 seconds        Nutrition Therapy Plan and Nutrition Goals: Nutrition Therapy & Goals - 04/25/17 1506      Nutrition Therapy   RD appointment deferred  Yes      Personal Nutrition Goals   Personal Goal #2  Patient would like to learn more about how to cook healthy meals. He is following a heart healthy diet low in carbs and sugar.     Additional  Goals?  No      Intervention Plan   Intervention  Nutrition handout(s) given to patient.    Expected Outcomes  Short Term Goal: Understand basic principles of dietary content, such as calories, fat, sodium, cholesterol and nutrients.       Nutrition Assessments: Nutrition Assessments - 03/18/17 1440      MEDFICTS Scores   Pre Score  32       Nutrition Goals Re-Evaluation:   Nutrition Goals Discharge (Final Nutrition Goals Re-Evaluation):   Psychosocial: Target Goals: Acknowledge presence or absence of significant depression and/or stress, maximize coping skills, provide positive support system. Participant is able to verbalize types and ability to use techniques and skills needed for reducing stress and depression.  Initial Review & Psychosocial Screening: Initial Psych Review & Screening - 03/18/17 1436      Initial Review   Current issues with  None Identified      Family Dynamics   Good Support System?  Yes    Comments  Patient has a good support system. He is taking care of his wife who has had a CVA.       Barriers   Psychosocial barriers to participate in program  There are no identifiable barriers or psychosocial needs.      Screening Interventions   Interventions  Encouraged to exercise;Provide feedback about the scores to participant    Expected Outcomes  Long Term goal: The participant improves quality of Life and PHQ9 Scores as seen by post scores and/or verbalization of changes       Quality of Life Scores: Quality of Life -  03/18/17 1409      Quality of Life Scores   Health/Function Pre  23.37 %    Socioeconomic Pre  23 %    Psych/Spiritual Pre  21.79 %    Family Pre  19.33 %    GLOBAL Pre  22.56 %      Scores of 19 and below usually indicate a poorer quality of life in these areas.  A difference of  2-3 points is a clinically meaningful difference.  A difference of 2-3 points in the total score of the Quality of Life Index has been associated with  significant improvement in overall quality of life, self-image, physical symptoms, and general health in studies assessing change in quality of life.  PHQ-9: Recent Review Flowsheet Data    Depression screen [facility] 2022/03/18 03/18/2017 02/09/2017   Decreased Interest 0 0   Down, Depressed, Hopeless 0 0   PHQ - 2 Score 0 0   Altered sleeping 1 -   Tired, decreased energy 0 -   Change in appetite 0 -   Feeling bad or failure about yourself  0 -   Trouble concentrating 0 -   Moving slowly or fidgety/restless 0 -   Suicidal thoughts 0 -   PHQ-9 Score 1 -   Difficult doing work/chores Not difficult at all -     Interpretation of Total Score  Total Score Depression Severity:  1-4 = Minimal depression, 5-9 = Mild depression, 10-14 = Moderate depression, 15-19 = Moderately severe depression, 20-27 = Severe depression   Psychosocial Evaluation and Intervention: Psychosocial Evaluation - 03/18/17 1438      Psychosocial Evaluation & Interventions   Interventions  Encouraged to exercise with the program and follow exercise prescription;Stress management education;Relaxation education    Comments  Patient's initial QOL score was 22.56 and his PHQ-9 score was 1. No pychosocial issues identified.     Expected Outcomes  Patient will have no psychosocial issues identified at discharge.     Continue Psychosocial Services   No Follow up required       Psychosocial Re-Evaluation: Psychosocial Re-Evaluation    [place] Name 04/25/17 1514             Psychosocial Re-Evaluation   Current issues with  None Identified       Comments  Patient's initial QOL score was 22.56 and his PHQ-9 score was 1. No issues identified.        Expected Outcomes  Patient will have no psychosocial issues identifed at discharge.        Interventions  Stress management education;Encouraged to attend Cardiac Rehabilitation for the exercise;Relaxation education       Continue Psychosocial Services   No Follow up required           Psychosocial Discharge (Final Psychosocial Re-Evaluation): Psychosocial Re-Evaluation - 04/25/17 1514      Psychosocial Re-Evaluation   Current issues with  None Identified    Comments  Patient's initial QOL score was 22.56 and his PHQ-9 score was 1. No issues identified.     Expected Outcomes  Patient will have no psychosocial issues identifed at discharge.     Interventions  Stress management education;Encouraged to attend Cardiac Rehabilitation for the exercise;Relaxation education    Continue Psychosocial Services   No Follow up required       Vocational Rehabilitation: Provide vocational rehab assistance to qualifying candidates.   Vocational Rehab Evaluation & Intervention: Vocational Rehab - 03/18/17 1433      Initial Vocational Rehab  Evaluation & Intervention   Assessment shows need for Vocational Rehabilitation  No       Education: Education Goals: Education classes will be provided on a weekly basis, covering required topics. Participant will state understanding/return demonstration of topics presented.  Learning Barriers/Preferences: Learning Barriers/Preferences - 03/18/17 1433      Learning Barriers/Preferences   Learning Barriers  None    Learning Preferences  Audio;Written Material;Skilled Demonstration       Education Topics: Hypertension, Hypertension Reduction -Define heart disease and high blood pressure. Discus how high blood pressure affects the body and ways to reduce high blood pressure.   Exercise and Your Heart -Discuss why it is important to exercise, the FITT principles of exercise, normal and abnormal responses to exercise, and how to exercise safely.   Angina -Discuss definition of angina, causes of angina, treatment of angina, and how to decrease risk of having angina.   Cardiac Medications -Review what the following cardiac medications are used for, how they affect the body, and side effects that may occur when taking the  medications.  Medications include Aspirin, Beta blockers, calcium channel blockers, ACE Inhibitors, angiotensin receptor blockers, diuretics, digoxin, and antihyperlipidemics.   CARDIAC REHAB PHASE II EXERCISE from 04/20/2017 in [place]  Date  03/24/17  Educator  DC  Instruction Review Code  2- Demonstrated Understanding      Congestive Heart Failure -Discuss the definition of CHF, how to live with CHF, the signs and symptoms of CHF, and how keep track of weight and sodium intake.   CARDIAC REHAB PHASE II EXERCISE from 04/20/2017 in [place]  Date  03/31/17  Educator  DC  Instruction Review Code  2- Demonstrated Understanding      Heart Disease and Intimacy -Discus the effect sexual activity has on the heart, how changes occur during intimacy as we age, and safety during sexual activity.   CARDIAC REHAB PHASE II EXERCISE from 04/20/2017 in [place]  Date  04/07/17  Educator  DC  Instruction Review Code  2- Demonstrated Understanding      Smoking Cessation / COPD -Discuss different methods to quit smoking, the health benefits of quitting smoking, and the definition of COPD.   CARDIAC REHAB PHASE II EXERCISE from 04/20/2017 in [place]  Date  04/14/17  Educator  DC  Instruction Review Code  2- Demonstrated Understanding      Nutrition I: Fats -Discuss the types of cholesterol, what cholesterol does to the heart, and how cholesterol levels can be controlled.   CARDIAC REHAB PHASE II EXERCISE from 04/20/2017 in [place]  Date  04/20/17  Educator  DC  Instruction Review Code  2- Demonstrated Understanding      Nutrition II: Labels -Discuss the different components of food labels and how to read food label   Heart Parts/Heart Disease and PAD -Discuss the anatomy of the heart, the pathway of blood circulation through the heart, and these are affected by  heart disease.   Stress I: Signs and Symptoms -Discuss the causes of stress, how stress may lead to anxiety and depression, and ways to limit stress.   Stress II: Relaxation -Discuss different types of relaxation techniques to limit stress.   Warning Signs of Stroke / TIA -Discuss definition of a stroke, what the signs and symptoms are of a stroke, and how to identify when someone is having stroke.   Knowledge Questionnaire Score: Knowledge Questionnaire Score - 03/18/17 1433  Knowledge Questionnaire Score   Pre Score  22/24       Core Components/Risk Factors/Patient Goals at Admission: Personal Goals and Risk Factors at Admission - 03/18/17 1434      Core Components/Risk Factors/Patient Goals on Admission    Weight Management  Weight Maintenance    Hypertension  Yes    Intervention  Provide education on lifestyle modifcations including regular physical activity/exercise, weight management, moderate sodium restriction and increased consumption of fresh fruit, vegetables, and low fat dairy, alcohol moderation, and smoking cessation.;Monitor prescription use compliance.    Expected Outcomes  Short Term: Continued assessment and intervention until BP is < 140/27mm HG in hypertensive participants. < 130/52mm HG in hypertensive participants with diabetes, heart failure or chronic kidney disease.;Long Term: Maintenance of blood pressure at goal levels.    Personal Goal Other  Yes    Personal Goal  Build upper body strength; improve grip strength; no more heart event.     Intervention  Patient will attend CR 3 days/week and suppment with exercise 2 days/week.     Expected Outcomes  Patient will meet his personal goals.        Core Components/Risk Factors/Patient Goals Review:  Goals and Risk Factor Review    Row Name 04/25/17 1507             Core Components/Risk Factors/Patient Goals Review   Personal Goals Review  Weight Management/Obesity;Hypertension Build upper body  strength and grip strength; no more heart events.        Review  Patient has completed 16 sessions maintaining his weight. He is doing well in the program with progression. He says he is feeling stronger and is able to do more around the house. He was able to work out in his yard this weekend without difficulty. His b/p is controlled. Will continue to monitor for progress.        Expected Outcomes  Patient will continue to complete sessions and complete the program meeting his personal goals.           Core Components/Risk Factors/Patient Goals at Discharge (Final Review):  Goals and Risk Factor Review - 04/25/17 1507      Core Components/Risk Factors/Patient Goals Review   Personal Goals Review  Weight Management/Obesity;Hypertension Build upper body strength and grip strength; no more heart events.     Review  Patient has completed 16 sessions maintaining his weight. He is doing well in the program with progression. He says he is feeling stronger and is able to do more around the house. He was able to work out in his yard this weekend without difficulty. His b/p is controlled. Will continue to monitor for progress.     Expected Outcomes  Patient will continue to complete sessions and complete the program meeting his personal goals.        ITP Comments: ITP Comments    Row Name 03/30/17 1412           ITP Comments  Patient new to program. He has completed 5 sessions. Will continue to monitor for progress.           Comments: ITP 30 Day REVIEW Patient doing well in the program. Will continue to monitor for progress.

## 2017-04-27 ENCOUNTER — Encounter (HOSPITAL_COMMUNITY)
Admission: RE | Admit: 2017-04-27 | Discharge: 2017-04-27 | Disposition: A | Discharge status: Home / Self Care | Payer: PPO | Source: Ambulatory Visit | Attending: Cardiology | Admitting: Cardiology

## 2017-04-27 ENCOUNTER — Other Ambulatory Visit: Payer: Self-pay | Admitting: *Deleted

## 2017-04-27 DIAGNOSIS — Z951 Presence of aortocoronary bypass graft: Secondary | ICD-10-CM

## 2017-04-27 DIAGNOSIS — I214 Non-ST elevation (NSTEMI) myocardial infarction: Secondary | ICD-10-CM

## 2017-04-27 NOTE — Progress Notes (Signed)
Daily Session Note  Patient Details  Name: Christopher Booth MRN: [id number] Date of Birth: March 14, 1940 Referring Provider:     [place] from 03/18/2017 in [place]  Referring Provider  [name]      Encounter Date: 04/27/2017  Check In: Session Check In - 04/27/17 1545      Check-In   Location  [facility]    Staff Present  Aundra Dubin, RN, BSN;Diane Coad, MS, EP, [facility], Exercise Physiologist    Supervising physician immediately available to respond to emergencies  See telemetry face sheet for immediately available MD    Medication changes reported      No    Fall or balance concerns reported     No    Warm-up and Cool-down  Performed as group-led instruction    Resistance Training Performed  Yes    VAD Patient?  No      Pain Assessment   Currently in Pain?  No/denies    Pain Score  0-No pain    Multiple Pain Sites  No       Capillary Blood Glucose: No results found for this or any previous visit (from the past 24 hour(s)).    Social History   Tobacco Use  Smoking Status Never Smoker  Smokeless Tobacco Never Used    Goals Met:  Independence with exercise equipment Exercise tolerated well No report of cardiac concerns or symptoms Strength training completed today  Goals Unmet:  Not Applicable  Comments: Check out 1645.   [name] is Medical Director for [facility] Cardiac and Pulmonary Rehab.

## 2017-04-27 NOTE — Patient Outreach (Addendum)
[facility]) Care Management  04/27/2017  Christopher Booth 1940/03/21 [id number]   On 02/16/17 1600 [facility] CM called to complete transition of care services with [name] who confirmed he would be attending cardiac rehabilitation services   Plan case closure on 02/16/17   [facility] CM Care Plan Problem One     Most Recent Value  Care Plan Problem One  Risk for hospital readmission secondary to recently diagnosed CAD with NSTEMI/ CABG  Role Documenting the Problem One  Care Management Coordinator  Care Plan for Problem One  Active  [facility] Long Term Goal   Over the next 31 days, patient will not experience hospital readmission, as evidenced by patient reporting and review of EMR during [facility] RN CCM outreach  [place] Term Goal Start Date  01/19/17  [facility] Long Term Goal Met Date  02/16/17  [facility] CM Short Term Goal #1   Over the next 7 days, patient will schedule hospital follow up office visit appointments with cardiologist and PCP, as evidenced by patient reporting during [facility] RN CCM outreach  [facility] CM Short Term Goal #1 Start Date  01/19/17  [facility] CM Short Term Goal #1 Met Date  01/26/17    [facility] CM Care Plan Problem Two     Most Recent Value  Care Plan Problem Two  need of community resources for transportation and possible services for disabled wife  Role Documenting the Problem Two  Care Management Coordinator  Care Plan for Problem Two  Active  [facility] Long Term Goal  over the next 31 days patient will receive assistance from [facility] for community resources prn for transportation and continued care of disabled spouse  [facility] Long Term Goal Start Date  01/28/17  [facility] Long Term Goal Met Date  02/04/17  [facility] CM Short Term Goal #1   over the next 10 days contact will be received from [place] CM Short Term Goal #1 Start Date  01/28/17  [facility] CM Short Term Goal #1 Met Date   02/04/17      Joelene Millin L. Lavina Hamman, RN, BSN, [place] Coordinator [phone] week day mobile

## 2017-04-28 NOTE — Progress Notes (Signed)
Cardiology Office Note  Date: 04/29/2017   ID: Christopher Booth, DOB Jan 18, 1941, MRN [id number]  PCP: Sinda Du, MD  Primary Cardiologist: Rozann Lesches, MD   Chief Complaint  Patient presents with  . Coronary Artery Disease    History of Present Illness: Christopher Booth is a 77 y.o. male last seen in February.  He presents for a follow-up visit.  Continues to do very well.  He is participating in cardiac rehabilitation.  Also walks for exercise on his own, went 3 miles the other day.  He does not report any angina symptoms or unusual shortness of breath with his activities.  He also has not experienced any palpitations.  He states that his blood pressure has been very well controlled on current regimen, he tracks this regularly.  At the last visit amiodarone was discontinued.  Today we talked about stopping Eliquis since he has had no further atrial fibrillation postoperatively.  He was comfortable with this.  We went over his medications which are outlined below.  Past Medical History:  Diagnosis Date  . Arthritis   . 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 Essential hypertension   . Gynecomastia   . Hyperaldosteronism   . Postoperative atrial fibrillation ([place])   . Spinal stenosis   . Temporomandibular joint disease   . Wears glasses     Past Surgical History:  Procedure Laterality Date  . CATARACT EXTRACTION W/PHACO Right 01/28/2015   Procedure: CATARACT EXTRACTION PHACO AND INTRAOCULAR LENS PLACEMENT RIGHT EYE;  Surgeon: Williams Che, MD;  Location: [facility];  Service: Ophthalmology;  Laterality: Right;  CDE:27.23  . CATARACT EXTRACTION W/PHACO Left 04/14/2015   Procedure: CATARACT EXTRACTION PHACO AND INTRAOCULAR LENS PLACEMENT LEFT EYE CDE=15.86;  Surgeon: Williams Che, MD;  Location: [facility];  Service: Ophthalmology;  Laterality: Left;  . CERVICAL FUSION  1993  . CHOLECYSTECTOMY  1995  . COLONOSCOPY    . CORONARY  ARTERY BYPASS GRAFT N/A 01/12/2017   Procedure: CORONARY ARTERY BYPASS GRAFTING (CABG);  Surgeon: Grace Isaac, MD;  Location: [place];  Service: Open Heart Surgery;  Laterality: N/A;  Times 3 using left internal mammary artery to LAD and endoscopically harvested right thigh saphenous vein to OM and PD  . KNEE ARTHROSCOPY Right 10/09/2013   Procedure: RIGHT ARTHROSCOPY KNEE Partial medial and lateral meniscal tear and chondroplasty;  Surgeon: Hessie Dibble, MD;  Location: [place];  Service: Orthopedics;  Laterality: Right;  Partial medial and lateral meniscal tear and chondroplasty   . LEFT HEART CATH AND CORONARY ANGIOGRAPHY N/A 01/11/2017   Procedure: LEFT HEART CATH AND CORONARY ANGIOGRAPHY;  Surgeon: Jettie Booze, MD;  Location: [place] CV LAB;  Service: Cardiovascular;  Laterality: N/A;  . TEE WITHOUT CARDIOVERSION N/A 01/12/2017   Procedure: TRANSESOPHAGEAL ECHOCARDIOGRAM (TEE);  Surgeon: Grace Isaac, MD;  Location: [place];  Service: Open Heart Surgery;  Laterality: N/A;  . TONSILLECTOMY      Current Outpatient Medications  Medication Sig Dispense Refill  . acetaminophen (TYLENOL) 500 MG tablet Take 500 mg by mouth every 6 (six) hours as needed for mild pain.     Marland Kitchen aspirin EC 81 MG EC tablet Take 1 tablet (81 mg total) by mouth daily.    Marland Kitchen atorvastatin (LIPITOR) 20 MG tablet Take 20 mg by mouth daily.    Marland Kitchen eplerenone (INSPRA) 25 MG tablet Take 1 tablet (25 mg total) by mouth  daily. (Patient taking differently: Take 50 mg by mouth 2 (two) times daily. )    . hydrochlorothiazide (HYDRODIURIL) 25 MG tablet Take 25 mg by mouth daily.    . metoprolol tartrate (LOPRESSOR) 25 MG tablet TAKE (1) TABLET BY MOUTH TWICE DAILY. 180 tablet 3  . Multiple Vitamins-Minerals (MULTI COMPLETE PO) Take by mouth daily.     No current facility-administered medications for this visit.    Allergies:  Diovan [valsartan]   Social History: The patient  reports that he  has never smoked. He has never used smokeless tobacco. He reports that he drinks alcohol. He reports that he does not use drugs.   ROS:  Please see the history of present illness. Otherwise, complete review of systems is positive for none.  All other systems are reviewed and negative.   Physical Exam: VS:  BP 130/84   Pulse 74   Ht 5\' 7"  (1.702 m)   Wt 163 lb (73.9 kg)   SpO2 98%   BMI 25.53 kg/m , BMI Body mass index is 25.53 kg/m.  Wt Readings from Last 3 Encounters:  04/29/17 163 lb (73.9 kg)  03/18/17 161 lb 12.8 oz (73.4 kg)  03/04/17 169 lb (76.7 kg)    General: Patient appears comfortable at rest. HEENT: Conjunctiva and lids normal, oropharynx clear. Neck: Supple, no elevated JVP or carotid bruits, no thyromegaly. Lungs: Clear to auscultation, nonlabored breathing at rest. Thorax: Well-healed sternal incision. Cardiac: Regular rate and rhythm, no S3 or significant systolic murmur, no pericardial rub. Abdomen: Soft, nontender, bowel sounds present. Extremities: No pitting edema, distal pulses 2+. Skin: Warm and dry. Musculoskeletal: No kyphosis. Neuropsychiatric: Alert and oriented x3, affect grossly appropriate.  ECG: I personally reviewed the tracing from 01/26/2017 which showed sinus rhythm with nonspecific T wave changes.  Recent Labwork: 01/13/2017: Magnesium 2.3; TSH 0.493 01/16/2017: BUN 15; Creatinine, Ser 0.91; Hemoglobin 11.7; Platelets 293; Potassium 3.9; Sodium 134     Component Value Date/Time   CHOL 137 01/12/2017 0148   TRIG 84 01/12/2017 0148   HDL 39 (L) 01/12/2017 0148   CHOLHDL 3.5 01/12/2017 0148   VLDL 17 01/12/2017 0148   LDLCALC 81 01/12/2017 0148    Other Studies Reviewed Today:  Echocardiogram 01/11/2017: Study Conclusions  - Left ventricle: The cavity size was normal. Wall thickness was increased in a pattern of mild LVH. Systolic function was normal. The estimated ejection fraction was in the range of 60% to 65%. Wall motion  was normal; there were no regional wall motion abnormalities. Doppler parameters are consistent with abnormal left ventricular relaxation (grade 1 diastolic dysfunction). The E/e&' ratio is between 8-15, suggesting indeterminate LV filling pressure. - Aortic valve: Sclerosis without stenosis. There was trivial regurgitation. - Aorta: Aortic root dimension: 41 mm (ED). - Aortic root: The aortic root is mildly dilated. - Mitral valve: Calcified annulus. Mildly thickened leaflets . There was trivial regurgitation. - Left atrium: The atrium was normal in size. - Inferior vena cava: The vessel was normal in size. The respirophasic diameter changes were in the normal range (= 50%), consistent with normal central venous pressure.  Urgent and Critical Findings: A non-critical finding, normal LV function, was reported to [name]. Impressions:  - LVEF 60-65%, mild LVH, normal wall motion, grade 1 DD, indeterminate LV filling pressure, aortic sclerosis with trivial AI, mildly dilated aortic root to 4.1 cm, aortic valve sclerosis, trace central AI, trivial MR, normal LA size, normal IVC  Cardiac catheterization 01/11/2017:  Ost Cx to Prox  Cx lesion is 90% stenosed.  Mid LM to Dist LM lesion is 50% stenosed.  Prox to mid LAD lesion is 70% stenosed. Eccentric, best seen in the RAO caudal view.  Ost LAD to Prox LAD lesion is 40% stenosed.  Ost RCA to Prox RCA lesion is 80% stenosed.  Dist RCA lesion is 80% stenosed.  The left ventricular ejection fraction is 45-50% by visual estimate.  There is mild left ventricular systolic dysfunction.  LV end diastolic pressure is normal.  There is no aortic valve stenosis.  Multivessel disease with culprit lesion being heavily calcified distal left main lesion extending into the circumflex ostium.  Assessment and Plan:  1.  Multivessel CAD status post CABG.  He is doing very well and participating in cardiac  rehabilitation.  Continue with regular exercise plan.  2.  Postoperative atrial fibrillation.  No recurrences off amiodarone.  He will now stop Eliquis but continue on Lopressor.  CHADSVASC score is 4.  If he has recurring arrhythmia he will need to be re-anticoagulated.  3.  Hypertension and hyperaldosteronism.  He continues on eplerenone and has been doing well in terms of blood pressure control.  4.  Mixed hyperlipidemia, continue Lipitor.  LDL 81.  Current medicines were reviewed with the patient today.  Disposition: Follow-up in 6 months.  Signed, Satira Sark, MD, [facility] 04/29/2017 1:08 PM    [facility] at [facility] [address] [place], [place] [address] Phone: [phone]; Fax: [phone]

## 2017-04-29 ENCOUNTER — Ambulatory Visit (INDEPENDENT_AMBULATORY_CARE_PROVIDER_SITE_OTHER): Payer: PPO | Admitting: Cardiology

## 2017-04-29 ENCOUNTER — Encounter: Payer: Self-pay | Admitting: Cardiology

## 2017-04-29 ENCOUNTER — Encounter (HOSPITAL_COMMUNITY)
Admission: RE | Admit: 2017-04-29 | Discharge: 2017-04-29 | Disposition: A | Discharge status: Home / Self Care | Payer: PPO | Source: Ambulatory Visit | Attending: Cardiology | Admitting: Cardiology

## 2017-04-29 VITALS — BP 130/84 | HR 74 | Ht 67.0 in | Wt 163.0 lb

## 2017-04-29 DIAGNOSIS — I4891 Unspecified atrial fibrillation: Secondary | ICD-10-CM | POA: Diagnosis not present

## 2017-04-29 DIAGNOSIS — E269 Hyperaldosteronism, unspecified: Secondary | ICD-10-CM

## 2017-04-29 DIAGNOSIS — I25119 Atherosclerotic heart disease of native coronary artery with unspecified angina pectoris: Secondary | ICD-10-CM | POA: Diagnosis not present

## 2017-04-29 DIAGNOSIS — I9789 Other postprocedural complications and disorders of the circulatory system, not elsewhere classified: Secondary | ICD-10-CM | POA: Diagnosis not present

## 2017-04-29 DIAGNOSIS — Z951 Presence of aortocoronary bypass graft: Secondary | ICD-10-CM

## 2017-04-29 DIAGNOSIS — E782 Mixed hyperlipidemia: Secondary | ICD-10-CM

## 2017-04-29 DIAGNOSIS — I214 Non-ST elevation (NSTEMI) myocardial infarction: Secondary | ICD-10-CM | POA: Diagnosis not present

## 2017-04-29 NOTE — Patient Instructions (Addendum)
Your physician wants you to follow-up in: 6 months with Dr.McDowell You will receive a reminder letter in the mail two months in advance. If you don't receive a letter, please call our office to schedule the follow-up appointment.  STOP Eliquis    All other medications stay the same    No lab work or tests ordered today     Thank you for choosing [facility] !

## 2017-04-29 NOTE — Progress Notes (Signed)
Daily Session Note  Patient Details  Name: ILIYA SPIVACK MRN: [id number] Date of Birth: 01-Oct-1940 Referring Provider:     [place] from 03/18/2017 in [place]  Referring Provider  [name]      Encounter Date: 04/29/2017  Check In: Session Check In - 04/29/17 1545      Check-In   Location  [facility]    Staff Present  Aundra Dubin, RN, BSN;Gregory Luther Parody, BS, EP, Exercise Physiologist    Supervising physician immediately available to respond to emergencies  See telemetry face sheet for immediately available MD    Medication changes reported      No    Fall or balance concerns reported     No    Warm-up and Cool-down  Performed as group-led instruction    Resistance Training Performed  Yes    VAD Patient?  No      Pain Assessment   Currently in Pain?  No/denies    Pain Score  0-No pain    Multiple Pain Sites  No       Capillary Blood Glucose: No results found for this or any previous visit (from the past 24 hour(s)).    Social History   Tobacco Use  Smoking Status Never Smoker  Smokeless Tobacco Never Used    Goals Met:  Independence with exercise equipment Exercise tolerated well No report of cardiac concerns or symptoms Strength training completed today  Goals Unmet:  Not Applicable  Comments: Check out 1645.   [name] is Medical Director for [facility] Cardiac and Pulmonary Rehab.

## 2017-05-02 ENCOUNTER — Encounter (HOSPITAL_COMMUNITY)
Admission: RE | Admit: 2017-05-02 | Discharge: 2017-05-02 | Disposition: A | Discharge status: Home / Self Care | Payer: PPO | Source: Ambulatory Visit | Attending: Cardiology | Admitting: Cardiology

## 2017-05-02 DIAGNOSIS — I214 Non-ST elevation (NSTEMI) myocardial infarction: Secondary | ICD-10-CM | POA: Diagnosis not present

## 2017-05-02 DIAGNOSIS — Z951 Presence of aortocoronary bypass graft: Secondary | ICD-10-CM

## 2017-05-02 NOTE — Progress Notes (Signed)
Daily Session Note  Patient Details  Name: HERSHAL ERIKSSON MRN: [id number] Date of Birth: Aug 14, 1940 Referring Provider:     [place] from 03/18/2017 in [place]  Referring Provider  [name]      Encounter Date: 05/02/2017  Check In: Session Check In - 05/02/17 1548      Check-In   Location  [facility]    Staff Present  Aundra Dubin, RN, BSN;Garet Hooton Luther Parody, BS, EP, Exercise Physiologist    Supervising physician immediately available to respond to emergencies  See telemetry face sheet for immediately available MD    Medication changes reported      No    Fall or balance concerns reported     No    Warm-up and Cool-down  Performed as group-led instruction    Resistance Training Performed  Yes    VAD Patient?  No      Pain Assessment   Currently in Pain?  No/denies    Pain Score  0-No pain    Multiple Pain Sites  No       Capillary Blood Glucose: No results found for this or any previous visit (from the past 24 hour(s)).    Social History   Tobacco Use  Smoking Status Never Smoker  Smokeless Tobacco Never Used    Goals Met:  Independence with exercise equipment Exercise tolerated well No report of cardiac concerns or symptoms Strength training completed today  Goals Unmet:  Not Applicable  Comments: Check out 445   [name] is Medical Director for [facility] and Pulmonary Rehab.

## 2017-05-04 ENCOUNTER — Encounter (HOSPITAL_COMMUNITY)
Admission: RE | Admit: 2017-05-04 | Discharge: 2017-05-04 | Disposition: A | Discharge status: Home / Self Care | Payer: PPO | Source: Ambulatory Visit | Attending: Cardiology | Admitting: Cardiology

## 2017-05-04 DIAGNOSIS — I214 Non-ST elevation (NSTEMI) myocardial infarction: Secondary | ICD-10-CM | POA: Diagnosis not present

## 2017-05-04 DIAGNOSIS — Z951 Presence of aortocoronary bypass graft: Secondary | ICD-10-CM

## 2017-05-04 NOTE — Progress Notes (Signed)
Daily Session Note  Patient Details  Name: Christopher Booth MRN: [id number] Date of Birth: 05-19-1940 Referring Provider:     [place] from 03/18/2017 in [place]  Referring Provider  [name]      Encounter Date: 05/04/2017  Check In: Session Check In - 05/04/17 1549      Check-In   Location  [facility]    Staff Present  Aundra Dubin, RN, BSN;Albertine Lafoy Luther Parody, BS, EP, Exercise Physiologist;Diane Coad, MS, EP, [facility], Exercise Physiologist    Supervising physician immediately available to respond to emergencies  See telemetry face sheet for immediately available MD    Medication changes reported      No    Fall or balance concerns reported     No    Warm-up and Cool-down  Performed as group-led instruction    Resistance Training Performed  Yes    VAD Patient?  No      Pain Assessment   Currently in Pain?  No/denies    Pain Score  0-No pain    Multiple Pain Sites  No       Capillary Blood Glucose: No results found for this or any previous visit (from the past 24 hour(s)).    Social History   Tobacco Use  Smoking Status Never Smoker  Smokeless Tobacco Never Used    Goals Met:  Independence with exercise equipment Exercise tolerated well No report of cardiac concerns or symptoms Strength training completed today  Goals Unmet:  Not Applicable  Comments: Check out 445   [name] is Medical Director for [place] and Pulmonary Rehab.

## 2017-05-06 ENCOUNTER — Encounter (HOSPITAL_COMMUNITY)
Admission: RE | Admit: 2017-05-06 | Discharge: 2017-05-06 | Disposition: A | Discharge status: Home / Self Care | Payer: PPO | Source: Ambulatory Visit | Attending: Cardiology | Admitting: Cardiology

## 2017-05-06 DIAGNOSIS — Z951 Presence of aortocoronary bypass graft: Secondary | ICD-10-CM

## 2017-05-06 DIAGNOSIS — I214 Non-ST elevation (NSTEMI) myocardial infarction: Secondary | ICD-10-CM

## 2017-05-06 NOTE — Progress Notes (Signed)
Daily Session Note  Patient Details  Name: Christopher Booth MRN: [id number] Date of Birth: November 12, 1940 Referring Provider:     [place] from 03/18/2017 in [place]  Referring Provider  [name]      Encounter Date: 05/06/2017  Check In: Session Check In - 05/06/17 1548      Check-In   Location  [facility]    Staff Present  Aundra Dubin, RN, BSN;[place] Luther Parody, BS, EP, Exercise Physiologist    Supervising physician immediately available to respond to emergencies  See telemetry face sheet for immediately available MD    Medication changes reported      No    Fall or balance concerns reported     No    Warm-up and Cool-down  Performed as group-led instruction    Resistance Training Performed  Yes    VAD Patient?  No      Pain Assessment   Currently in Pain?  No/denies    Pain Score  0-No pain    Multiple Pain Sites  No       Capillary Blood Glucose: No results found for this or any previous visit (from the past 24 hour(s)).    Social History   Tobacco Use  Smoking Status Never Smoker  Smokeless Tobacco Never Used    Goals Met:  Independence with exercise equipment Exercise tolerated well No report of cardiac concerns or symptoms Strength training completed today  Goals Unmet:  Not Applicable  Comments: Check out 445   [name] is Medical Director for [place] and Pulmonary Rehab.

## 2017-05-09 ENCOUNTER — Encounter (HOSPITAL_COMMUNITY)
Admission: RE | Admit: 2017-05-09 | Discharge: 2017-05-09 | Disposition: A | Discharge status: Home / Self Care | Payer: PPO | Source: Ambulatory Visit | Attending: Cardiology | Admitting: Cardiology

## 2017-05-09 DIAGNOSIS — Z951 Presence of aortocoronary bypass graft: Secondary | ICD-10-CM

## 2017-05-09 DIAGNOSIS — I214 Non-ST elevation (NSTEMI) myocardial infarction: Secondary | ICD-10-CM

## 2017-05-09 NOTE — Progress Notes (Signed)
Daily Session Note  Patient Details  Name: Christopher Booth MRN: [id number] Date of Birth: 02/20/1940 Referring Provider:     CARDIAC REHAB PHASE II ORIENTATION from 03/18/2017 in [place] CARDIAC REHABILITATION  Referring Provider  Dr. McDowell      Encounter Date: 05/09/2017  Check In: Session Check In - 05/09/17 1553      Check-In   Location  [facility]    Staff Present  Debra Johnson, RN, BSN;Fidelis Loth, BS, EP, Exercise Physiologist    Supervising physician immediately available to respond to emergencies  See telemetry face sheet for immediately available MD    Medication changes reported      No    Fall or balance concerns reported     No    Warm-up and Cool-down  Performed as group-led instruction    Resistance Training Performed  Yes    VAD Patient?  No      Pain Assessment   Currently in Pain?  No/denies    Pain Score  0-No pain    Multiple Pain Sites  No       Capillary Blood Glucose: No results found for this or any previous visit (from the past 24 hour(s)).    Social History   Tobacco Use  Smoking Status Never Smoker  Smokeless Tobacco Never Used    Goals Met:  Independence with exercise equipment Exercise tolerated well No report of cardiac concerns or symptoms Strength training completed today  Goals Unmet:  Not Applicable  Comments: Check out 445   Dr. Suresh Koneswaran is Medical Director for [place] Cardiac and Pulmonary Rehab. 

## 2017-05-11 ENCOUNTER — Encounter (HOSPITAL_COMMUNITY)
Admission: RE | Admit: 2017-05-11 | Discharge: 2017-05-11 | Disposition: A | Discharge status: Home / Self Care | Payer: PPO | Source: Ambulatory Visit | Attending: Cardiology | Admitting: Cardiology

## 2017-05-11 DIAGNOSIS — Z951 Presence of aortocoronary bypass graft: Secondary | ICD-10-CM

## 2017-05-11 DIAGNOSIS — I214 Non-ST elevation (NSTEMI) myocardial infarction: Secondary | ICD-10-CM | POA: Diagnosis not present

## 2017-05-11 NOTE — Progress Notes (Signed)
Daily Session Note  Patient Details  Name: QUANDRE POLINSKI MRN: [id number] Date of Birth: [birth year] Referring Provider:     [place] from 03/18/2017 in [place]  Referring Provider  [name]      Encounter Date: 05/11/2017  Check In: Session Check In - 05/11/17 1545      Check-In   Location  [facility]    Staff Present  Aundra Dubin, RN, BSN;Gregory Luther Parody, BS, EP, Exercise Physiologist    Supervising physician immediately available to respond to emergencies  See telemetry face sheet for immediately available MD    Medication changes reported      No    Fall or balance concerns reported     No    Warm-up and Cool-down  Performed as group-led instruction    Resistance Training Performed  Yes    VAD Patient?  No      Pain Assessment   Currently in Pain?  No/denies    Pain Score  0-No pain    Multiple Pain Sites  No       Capillary Blood Glucose: No results found for this or any previous visit (from the past 24 hour(s)).    Social History   Tobacco Use  Smoking Status Never Smoker  Smokeless Tobacco Never Used    Goals Met:  Independence with exercise equipment Exercise tolerated well No report of cardiac concerns or symptoms Strength training completed today  Goals Unmet:  Not Applicable  Comments: Check out 1645.   [name] is Medical Director for [facility] Cardiac and Pulmonary Rehab.

## 2017-05-13 ENCOUNTER — Encounter (HOSPITAL_COMMUNITY): Payer: PPO

## 2017-05-16 ENCOUNTER — Encounter (HOSPITAL_COMMUNITY)
Admission: RE | Admit: 2017-05-16 | Discharge: 2017-05-16 | Disposition: A | Discharge status: Home / Self Care | Payer: PPO | Source: Ambulatory Visit | Attending: Cardiology | Admitting: Cardiology

## 2017-05-16 DIAGNOSIS — I214 Non-ST elevation (NSTEMI) myocardial infarction: Secondary | ICD-10-CM | POA: Diagnosis not present

## 2017-05-16 NOTE — Progress Notes (Signed)
Daily Session Note  Patient Details  Name: Christopher Booth MRN: [id number] Date of Birth: 03-05-1940 Referring Provider:     [place] from 03/18/2017 in [place]  Referring Provider  [name]      Encounter Date: 05/16/2017  Check In: Session Check In - 05/16/17 1545      Check-In   Location  [facility]    Staff Present  Donato Studley Angelina Pih, MS, EP, [facility], Exercise Physiologist;Gregory Luther Parody, BS, EP, Exercise Physiologist;Debra Wynetta Emery, RN, BSN    Supervising physician immediately available to respond to emergencies  See telemetry face sheet for immediately available MD    Medication changes reported      No    Fall or balance concerns reported     No    Tobacco Cessation  No Change    Warm-up and Cool-down  Performed as group-led instruction    Resistance Training Performed  Yes    VAD Patient?  No      Pain Assessment   Currently in Pain?  No/denies    Pain Score  0-No pain    Multiple Pain Sites  No       Capillary Blood Glucose: No results found for this or any previous visit (from the past 24 hour(s)).  Exercise Prescription Changes - 05/16/17 1400      Response to Exercise   Blood Pressure (Admit)  110/64    Blood Pressure (Exercise)  140/80    Blood Pressure (Exit)  148/80    Heart Rate (Admit)  75 bpm    Heart Rate (Exercise)  124 bpm    Heart Rate (Exit)  85 bpm    Rating of Perceived Exertion (Exercise)  11    Duration  Progress to 30 minutes of  aerobic without signs/symptoms of physical distress    Intensity  THRR unchanged 103-116-130      Progression   Progression  Continue to progress workloads to maintain intensity without signs/symptoms of physical distress.      Resistance Training   Training Prescription  Yes    Weight  3    Reps  10-15      Treadmill   MPH  3.1    Grade  0    Minutes  20    METs  3.5      Elliptical   Level  1    Speed  38    Minutes  15    METs  2.9      Home Exercise Plan   Plans to continue exercise at  Home (comment)    Frequency  Add 2 additional days to program exercise sessions.    Initial Home Exercises Provided  03/21/17       Social History   Tobacco Use  Smoking Status Never Smoker  Smokeless Tobacco Never Used    Goals Met:  Independence with exercise equipment Exercise tolerated well No report of cardiac concerns or symptoms Strength training completed today  Goals Unmet:  Not Applicable  Comments: Check out: [place]   [name] is Medical Director for [place] and Pulmonary Rehab.

## 2017-05-18 ENCOUNTER — Encounter (HOSPITAL_COMMUNITY)
Admission: RE | Admit: 2017-05-18 | Discharge: 2017-05-18 | Disposition: A | Discharge status: Home / Self Care | Payer: PPO | Source: Ambulatory Visit | Attending: Cardiology | Admitting: Cardiology

## 2017-05-18 DIAGNOSIS — Z951 Presence of aortocoronary bypass graft: Secondary | ICD-10-CM

## 2017-05-18 DIAGNOSIS — I214 Non-ST elevation (NSTEMI) myocardial infarction: Secondary | ICD-10-CM

## 2017-05-18 NOTE — Progress Notes (Signed)
Daily Session Note  Patient Details  Name: Christopher Booth MRN: [id number] Date of Birth: [birth year] Referring Provider:     [place] from 03/18/2017 in [place]  Referring Provider  [name]      Encounter Date: 05/18/2017  Check In: Session Check In - 05/18/17 1545      Check-In   Location  [facility]    Staff Present  [name], MS, EP, [facility], Exercise Physiologist;Gregory Luther Parody, BS, EP, Exercise Physiologist;Myles Tavella Wynetta Emery, RN, BSN    Supervising physician immediately available to respond to emergencies  See telemetry face sheet for immediately available MD    Medication changes reported      No    Fall or balance concerns reported     No    Warm-up and Cool-down  Performed as group-led instruction    Resistance Training Performed  Yes    VAD Patient?  No      Pain Assessment   Currently in Pain?  No/denies    Pain Score  0-No pain    Multiple Pain Sites  No       Capillary Blood Glucose: No results found for this or any previous visit (from the past 24 hour(s)).    Social History   Tobacco Use  Smoking Status Never Smoker  Smokeless Tobacco Never Used    Goals Met:  Independence with exercise equipment Exercise tolerated well Personal goals reviewed Strength training completed today  Goals Unmet:  Not Applicable  Comments: Check out 1645.   [name] is Medical Director for [facility] and Pulmonary Rehab.

## 2017-05-20 ENCOUNTER — Encounter (HOSPITAL_COMMUNITY)
Admission: RE | Admit: 2017-05-20 | Discharge: 2017-05-20 | Disposition: A | Discharge status: Home / Self Care | Payer: PPO | Source: Ambulatory Visit | Attending: Cardiology | Admitting: Cardiology

## 2017-05-20 DIAGNOSIS — I214 Non-ST elevation (NSTEMI) myocardial infarction: Secondary | ICD-10-CM | POA: Diagnosis not present

## 2017-05-20 DIAGNOSIS — Z951 Presence of aortocoronary bypass graft: Secondary | ICD-10-CM

## 2017-05-20 NOTE — Progress Notes (Signed)
Daily Session Note  Patient Details  Name: SAJJAD HONEA MRN: [id number] Date of Birth: [birth year] Referring Provider:     [place] from 03/18/2017 in [place]  Referring Provider  [name]      Encounter Date: 05/20/2017  Check In: Session Check In - 05/20/17 1537      Check-In   Location  [facility]    Staff Present  [name], MS, EP, [facility], Exercise Physiologist;Gregory Luther Parody, BS, EP, Exercise Physiologist;Christapher Gillian Wynetta Emery, RN, BSN    Supervising physician immediately available to respond to emergencies  See telemetry face sheet for immediately available MD    Medication changes reported      No    Fall or balance concerns reported     No    Warm-up and Cool-down  Performed as group-led instruction    Resistance Training Performed  Yes    VAD Patient?  No      Pain Assessment   Currently in Pain?  No/denies    Pain Score  0-No pain    Multiple Pain Sites  No       Capillary Blood Glucose: No results found for this or any previous visit (from the past 24 hour(s)).    Social History   Tobacco Use  Smoking Status Never Smoker  Smokeless Tobacco Never Used    Goals Met:  Independence with exercise equipment Exercise tolerated well No report of cardiac concerns or symptoms Strength training completed today  Goals Unmet:  Not Applicable  Comments: Check out 1645.   [name] is Medical Director for [facility] Cardiac and Pulmonary Rehab.

## 2017-05-23 ENCOUNTER — Encounter (HOSPITAL_COMMUNITY)
Admission: RE | Admit: 2017-05-23 | Discharge: 2017-05-23 | Disposition: A | Discharge status: Home / Self Care | Payer: PPO | Source: Ambulatory Visit | Attending: Cardiology | Admitting: Cardiology

## 2017-05-23 DIAGNOSIS — I214 Non-ST elevation (NSTEMI) myocardial infarction: Secondary | ICD-10-CM

## 2017-05-23 DIAGNOSIS — Z951 Presence of aortocoronary bypass graft: Secondary | ICD-10-CM

## 2017-05-23 NOTE — Progress Notes (Signed)
Daily Session Note  Patient Details  Name: Christopher Booth MRN: [id number] Date of Birth: 05-Aug-1940 Referring Provider:     [place] from 03/18/2017 in [place]  Referring Provider  [name]      Encounter Date: 05/23/2017  Check In: Session Check In - 05/23/17 1541      Check-In   Location  [facility]    Staff Present  [name], MS, EP, [facility], Exercise Physiologist;Gregory Luther Parody, BS, EP, Exercise Physiologist;Jaretssi Kraker Wynetta Emery, RN, BSN    Supervising physician immediately available to respond to emergencies  See telemetry face sheet for immediately available MD    Medication changes reported      No    Fall or balance concerns reported     No    Warm-up and Cool-down  Performed as group-led instruction    Resistance Training Performed  Yes    VAD Patient?  No      Pain Assessment   Currently in Pain?  No/denies    Pain Score  0-No pain    Multiple Pain Sites  No       Capillary Blood Glucose: No results found for this or any previous visit (from the past 24 hour(s)).    Social History   Tobacco Use  Smoking Status Never Smoker  Smokeless Tobacco Never Used    Goals Met:  Independence with exercise equipment Exercise tolerated well No report of cardiac concerns or symptoms Strength training completed today  Goals Unmet:  Not Applicable  Comments: Check out 1645.   [name] is Medical Director for [facility] Cardiac and Pulmonary Rehab.

## 2017-05-25 ENCOUNTER — Encounter (HOSPITAL_COMMUNITY)
Admission: RE | Admit: 2017-05-25 | Discharge: 2017-05-25 | Disposition: A | Discharge status: Home / Self Care | Payer: PPO | Source: Ambulatory Visit | Attending: Cardiology | Admitting: Cardiology

## 2017-05-25 DIAGNOSIS — Z951 Presence of aortocoronary bypass graft: Secondary | ICD-10-CM | POA: Diagnosis not present

## 2017-05-25 DIAGNOSIS — I214 Non-ST elevation (NSTEMI) myocardial infarction: Secondary | ICD-10-CM | POA: Diagnosis not present

## 2017-05-25 NOTE — Progress Notes (Signed)
Daily Session Note  Patient Details  Name: Christopher Booth MRN: [id number] Date of Birth: 1940/06/30 Referring Provider:     [place] from 03/18/2017 in [place]  Referring Provider  [name]      Encounter Date: 05/25/2017  Check In: Session Check In - 05/25/17 1545      Check-In   Location  [facility]    Staff Present  [name], MS, EP, [facility], Exercise Physiologist;Gregory Luther Parody, BS, EP, Exercise Physiologist;[place] Wynetta Emery, RN, BSN    Supervising physician immediately available to respond to emergencies  See telemetry face sheet for immediately available MD    Medication changes reported      No    Fall or balance concerns reported     No    Warm-up and Cool-down  Performed as group-led instruction    Resistance Training Performed  Yes    VAD Patient?  No      Pain Assessment   Currently in Pain?  No/denies    Pain Score  0-No pain    Multiple Pain Sites  No       Capillary Blood Glucose: No results found for this or any previous visit (from the past 24 hour(s)).    Social History   Tobacco Use  Smoking Status Never Smoker  Smokeless Tobacco Never Used    Goals Met:  Independence with exercise equipment Exercise tolerated well No report of cardiac concerns or symptoms Strength training completed today  Goals Unmet:  Not Applicable  Comments: Check out 1645.   [name] is Medical Director for [facility] Cardiac and Pulmonary Rehab.

## 2017-05-25 NOTE — Progress Notes (Signed)
Cardiac Individual Treatment Plan  Patient Details  Name: Christopher Booth MRN: [id number] Date of Birth: Jun 03, 1940 Referring Provider:     [facility] from 03/18/2017 in [place]  Referring Provider  [name]      Initial Encounter Date:    CARDIAC REHAB PHASE II ORIENTATION from 03/18/2017 in [place]  Date  03/18/17  Referring Provider  [name]      Visit Diagnosis: NSTEMI (non-ST elevated myocardial infarction) ([place])  S/P CABG x 3  Patient's Home Medications on Admission:  Current Outpatient Medications:  .  acetaminophen (TYLENOL) 500 MG tablet, Take 500 mg by mouth every 6 (six) hours as needed for mild pain. , Disp: , Rfl:  .  aspirin EC 81 MG EC tablet, Take 1 tablet (81 mg total) by mouth daily., Disp: , Rfl:  .  atorvastatin (LIPITOR) 20 MG tablet, Take 20 mg by mouth daily., Disp: , Rfl:  .  eplerenone (INSPRA) 25 MG tablet, Take 1 tablet (25 mg total) by mouth daily. (Patient taking differently: Take 50 mg by mouth 2 (two) times daily. ), Disp: , Rfl:  .  hydrochlorothiazide (HYDRODIURIL) 25 MG tablet, Take 25 mg by mouth daily., Disp: , Rfl:  .  metoprolol tartrate (LOPRESSOR) 25 MG tablet, TAKE (1) TABLET BY MOUTH TWICE DAILY., Disp: 180 tablet, Rfl: 3 .  Multiple Vitamins-Minerals (MULTI COMPLETE PO), Take by mouth daily., Disp: , Rfl:   Past Medical History: Past Medical History:  Diagnosis Date  . Arthritis   . 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 Essential hypertension   . Gynecomastia   . Hyperaldosteronism   . Postoperative atrial fibrillation ([place])   . Spinal stenosis   . Temporomandibular joint disease   . Wears glasses     Tobacco Use: Social History   Tobacco Use  Smoking Status Never Smoker  Smokeless Tobacco Never Used    Labs: Recent Review Flowsheet Data    Labs for ITP Cardiac and Pulmonary Rehab Latest Ref  Rng & Units 01/12/2017 01/12/2017 01/12/2017 01/12/2017 01/13/2017   Cholestrol 0 - 200 mg/dL - - - - -   LDLCALC 0 - 99 mg/dL - - - - -   HDL >40 mg/dL - - - - -   Trlycerides <150 mg/dL - - - - -   Hemoglobin A1c 4.8 - 5.6 % - - - - -   PHART 7.350 - 7.450 7.448 7.326(L) 7.371 - -   PCO2ART 32.0 - 48.0 mmHg 29.9(L) 43.1 39.2 - -   HCO3 20.0 - 28.0 mmol/L 20.9 22.5 22.6 - -   TCO2 22 - 32 mmol/L 22 24 24 24 22    ACIDBASEDEF 0.0 - 2.0 mmol/L 3.0(H) 3.0(H) 2.0 - -   O2SAT % 100.0 99.0 100.0 - -      Capillary Blood Glucose: Lab Results  Component Value Date   GLUCAP 107 (H) 01/17/2017   GLUCAP 100 (H) 01/16/2017   GLUCAP 124 (H) 01/16/2017   GLUCAP 106 (H) 01/16/2017   GLUCAP 88 01/16/2017     Exercise Target Goals:    Exercise Program Goal: Individual exercise prescription set using results from initial 6 min walk test and THRR while considering  patient's activity barriers and safety.   Exercise Prescription Goal: Starting with aerobic activity 30 plus minutes a day, 3 days per week for initial exercise prescription. Provide home exercise prescription and guidelines that  participant acknowledges understanding prior to discharge.  Activity Barriers & Risk Stratification: Activity Barriers & Cardiac Risk Stratification - 03/18/17 1409      Activity Barriers & Cardiac Risk Stratification   Activity Barriers  Back Problems    Cardiac Risk Stratification  High       6 Minute Walk: 6 Minute Walk    Row Name 03/18/17 1408         6 Minute Walk   Phase  Initial     Distance  1500 feet     Distance % Change  0 %     Distance Feet Change  0 ft     Walk Time  6 minutes     # of Rest Breaks  0     MPH  2.84     METS  3.17     RPE  11     Perceived Dyspnea   9     VO2 Peak  10.71     Symptoms  No     Resting HR  85 bpm     Resting BP  112/86     Resting Oxygen Saturation   96 %     Exercise Oxygen Saturation  during 6 min walk  93 %     Max Ex. HR  99 bpm      Max Ex. BP  134/84     2 Minute Post BP  120/80        Oxygen Initial Assessment: Oxygen Initial Assessment - 03/18/17 1447      Home Oxygen   Home Oxygen Device  None    Sleep Oxygen Prescription  None    Home Exercise Oxygen Prescription  None    Home at Rest Exercise Oxygen Prescription  None      Initial 6 min Walk   Oxygen Used  None      Program Oxygen Prescription   Program Oxygen Prescription  None       Oxygen Re-Evaluation:   Oxygen Discharge (Final Oxygen Re-Evaluation):   Initial Exercise Prescription: Initial Exercise Prescription - 03/18/17 1400      Date of Initial Exercise RX and Referring Provider   Date  03/18/17    Referring Provider  [name]      Treadmill   MPH  1.8    Grade  0    Minutes  15    METs  2.3      Recumbant Elliptical   Level  1    RPM  49    Watts  62    Minutes  20    METs  3.4      Prescription Details   Frequency (times per week)  3    Duration  Progress to 30 minutes of continuous aerobic without signs/symptoms of physical distress      Intensity   THRR 40-80% of Max Heartrate  109-120-132    Ratings of Perceived Exertion  11-13    Perceived Dyspnea  0-4      Progression   Progression  Continue progressive overload as per policy without signs/symptoms or physical distress.      Resistance Training   Training Prescription  Yes    Weight  1    Reps  10-15       Perform Capillary Blood Glucose checks as needed.  Exercise Prescription Changes:  Exercise Prescription Changes    Row Name 03/22/17 0700 03/30/17 1400 04/12/17 0800 04/21/17 1400 05/16/17 1400  Response to Exercise   Blood Pressure (Admit)  -  124/70  104/70  110/66  110/64   Blood Pressure (Exercise)  -  140/92  120/80  130/70  140/80   Blood Pressure (Exit)  -  110/70  110/62  90/50  148/80   Heart Rate (Admit)  -  75 bpm  72 bpm  92 bpm  75 bpm   Heart Rate (Exercise)  -  777 bpm  91 bpm  115 bpm  124 bpm   Heart Rate (Exit)  -  83  bpm  81 bpm  80 bpm  85 bpm   Rating of Perceived Exertion (Exercise)  -  11  10  12  11    Duration  -  Progress to 30 minutes of  aerobic without signs/symptoms of physical distress  Progress to 30 minutes of  aerobic without signs/symptoms of physical distress  Progress to 30 minutes of  aerobic without signs/symptoms of physical distress  Progress to 30 minutes of  aerobic without signs/symptoms of physical distress   Intensity  -  THRR New 103-116-130  THRR New 101-115-130  THRR unchanged 101-115-130  THRR unchanged 103-116-130     Progression   Progression  -  Continue to progress workloads to maintain intensity without signs/symptoms of physical distress.  Continue to progress workloads to maintain intensity without signs/symptoms of physical distress.  Continue to progress workloads to maintain intensity without signs/symptoms of physical distress.  Continue to progress workloads to maintain intensity without signs/symptoms of physical distress.     Resistance Training   Training Prescription  Yes  Yes  Yes  Yes  Yes   Weight  1  1  2  2  3    Reps  10-15  10-15  10-15  10-15  10-15     Treadmill   MPH  1.8  2.4  2.9  3  3.1   Grade  0  0  0  0  0   Minutes  15  20  20  20  20    METs  2.3  2.84  3.22  3.29  3.5     Recumbant Elliptical   Level  1  1  1   -  -   RPM  49  63  60  -  -   Watts  62  85  90  -  -   Minutes  20  20  20   -  -   METs  3.4  4.5  5.2  -  -     Elliptical   Level  -  -  -  1  1   Speed  -  -  -  35  38   Minutes  -  -  -  15  15   METs  -  -  -  2.9  2.9     Home Exercise Plan   Plans to continue exercise at  Home (comment)  Home (comment)  Home (comment)  Home (comment)  Home (comment)   Frequency  Add 2 additional days to program exercise sessions.  Add 2 additional days to program exercise sessions.  Add 2 additional days to program exercise sessions.  Add 2 additional days to program exercise sessions.  Add 2 additional days to program exercise  sessions.   Initial Home Exercises Provided  03/21/17  03/21/17  03/21/17  03/21/17  03/21/17   Row Name 05/19/17 0700  Response to Exercise   Blood Pressure (Admit)  110/64       Blood Pressure (Exercise)  140/80       Blood Pressure (Exit)  148/80       Heart Rate (Admit)  75 bpm       Heart Rate (Exercise)  124 bpm       Heart Rate (Exit)  85 bpm       Rating of Perceived Exertion (Exercise)  11       Duration  Progress to 30 minutes of  aerobic without signs/symptoms of physical distress       Intensity  THRR unchanged 103-116-130         Progression   Progression  Continue to progress workloads to maintain intensity without signs/symptoms of physical distress.         Resistance Training   Training Prescription  Yes       Weight  3       Reps  10-15         Treadmill   MPH  3.1       Grade  0       Minutes  20       METs  3.5         Elliptical   Level  1       Speed  38       Minutes  15       METs  2.9         Home Exercise Plan   Plans to continue exercise at  Home (comment)       Frequency  Add 2 additional days to program exercise sessions.       Initial Home Exercises Provided  03/21/17          Exercise Comments:  Exercise Comments    Row Name 03/22/17 0747 03/30/17 1405 04/12/17 0829 04/21/17 1501 05/16/17 1413   Exercise Comments  Patient received the take home exercise plan 03/21/2017. THR was addressed as were safety guidelines for being active when not in CR. Patient demonstrated and understanding and was encouraged to ask any future questions as they arise.   Patient is doing well in CR and has already progressed his speed on the treadmill as well as his RPMs and watts on the recumbent elliptical. Patient is progressing well and has stated taht he feels well in the program. We are working towards gaining the patients goals.   Patient is doing well in CR and has increased substaintially on the treadmill to 2.61mph and has maintained his  level on the recumbent elliptical. Patient states that he still feels well in the program as we continue to work towards patients goals.   Patient is doing very well in CR and has increased his speed on the treadmill. Patient also has been moved from the recumbent elliptical to the stand up elliptical in order for a harder workout. Patient has been doing well on the stand up version of the elliptical. Patient states that he has gained stamina since beginning the program.   Patient continues to do well in CR. Patient has increased his speed on the treadmill to 3.52mph and has also maintained his level and watts on the stand up elliptical. Patient states that he feels more endurance since beginnning the program and that the elliptical feels like a great workout for him. Patient will continued to be monitored throughout the remainder of the program.  Exercise Goals and Review:  Exercise Goals    Row Name 03/18/17 1409             Exercise Goals   Increase Physical Activity  Yes       Intervention  Provide advice, education, support and counseling about physical activity/exercise needs.;Develop an individualized exercise prescription for aerobic and resistive training based on initial evaluation findings, risk stratification, comorbidities and participant's personal goals.       Expected Outcomes  Short Term: Attend rehab on a regular basis to increase amount of physical activity.       Increase Strength and Stamina  Yes       Intervention  Provide advice, education, support and counseling about physical activity/exercise needs.;Develop an individualized exercise prescription for aerobic and resistive training based on initial evaluation findings, risk stratification, comorbidities and participant's personal goals.       Expected Outcomes  Short Term: Increase workloads from initial exercise prescription for resistance, speed, and METs.       Able to understand and use rate of perceived exertion  (RPE) scale  Yes       Intervention  Provide education and explanation on how to use RPE scale       Expected Outcomes  Short Term: Able to use RPE daily in rehab to express subjective intensity level;Long Term:  Able to use RPE to guide intensity level when exercising independently       Able to understand and use Dyspnea scale  Yes       Intervention  Provide education and explanation on how to use Dyspnea scale       Expected Outcomes  Long Term: Able to use Dyspnea scale to guide intensity level when exercising independently;Short Term: Able to use Dyspnea scale daily in rehab to express subjective sense of shortness of breath during exertion       Knowledge and understanding of Target Heart Rate Range (THRR)  Yes       Intervention  Provide education and explanation of THRR including how the numbers were predicted and where they are located for reference       Expected Outcomes  Short Term: Able to state/look up THRR;Long Term: Able to use THRR to govern intensity when exercising independently;Short Term: Able to use daily as guideline for intensity in rehab       Able to check pulse independently  Yes       Intervention  Provide education and demonstration on how to check pulse in carotid and radial arteries.;Review the importance of being able to check your own pulse for safety during independent exercise       Expected Outcomes  Short Term: Able to explain why pulse checking is important during independent exercise;Long Term: Able to check pulse independently and accurately       Understanding of Exercise Prescription  Yes       Intervention  Provide education, explanation, and written materials on patient's individual exercise prescription       Expected Outcomes  Short Term: Able to explain program exercise prescription;Long Term: Able to explain home exercise prescription to exercise independently          Exercise Goals Re-Evaluation : Exercise Goals Re-Evaluation    Row Name 03/30/17  1403 04/21/17 1459 05/16/17 1412         Exercise Goal Re-Evaluation   Exercise Goals Review  Increase Physical Activity;Able to understand and use rate of perceived exertion (RPE) scale;Knowledge and understanding of  Target Heart Rate Range (THRR);Understanding of Exercise Prescription;Able to understand and use Dyspnea scale  Increase Physical Activity;Able to understand and use rate of perceived exertion (RPE) scale;Knowledge and understanding of Target Heart Rate Range (THRR);Understanding of Exercise Prescription;Able to understand and use Dyspnea scale  Increase Physical Activity;Able to understand and use rate of perceived exertion (RPE) scale;Knowledge and understanding of Target Heart Rate Range (THRR);Understanding of Exercise Prescription;Able to understand and use Dyspnea scale     Comments  Patient is doing well in CR and has already progressed his speed on the treadmill as well as his RPMs and watts on the recumbent elliptical. Patient is progressing well and has stated taht he feels well in the program. We are working towards gaining the patients goals.   Patient is doing very well in CR and has increased his speed on the treadmill. Patient also has been moved from the recumbent elliptical to the stand up elliptical in order for a harder workout. Patient has been doing well on the stand up version of the elliptical. Patient states that he has gained stamina since beginning the program.   Patient continues to do well in CR. Patient has increased his speed on the treadmill to 3.89mph and has also maintained his level and watts on the stand up elliptical. Patient states that he feels more endurance since beginnning the program and that the elliptical feels like a great workout for him. Patient will continued to be monitored throughout the remainder of the program.      Expected Outcomes  Patient wishes to gain upper body strength and to have no more heart events in the future.   Patient wishes to  gain upper body strength and to have no more heart events in the future.   Patient wishes to gain upper body strength and to have no more heart events in the future.          Discharge Exercise Prescription (Final Exercise Prescription Changes): Exercise Prescription Changes - 05/19/17 0700      Response to Exercise   Blood Pressure (Admit)  110/64    Blood Pressure (Exercise)  140/80    Blood Pressure (Exit)  148/80    Heart Rate (Admit)  75 bpm    Heart Rate (Exercise)  124 bpm    Heart Rate (Exit)  85 bpm    Rating of Perceived Exertion (Exercise)  11    Duration  Progress to 30 minutes of  aerobic without signs/symptoms of physical distress    Intensity  THRR unchanged 103-116-130      Progression   Progression  Continue to progress workloads to maintain intensity without signs/symptoms of physical distress.      Resistance Training   Training Prescription  Yes    Weight  3    Reps  10-15      Treadmill   MPH  3.1    Grade  0    Minutes  20    METs  3.5      Elliptical   Level  1    Speed  38    Minutes  15    METs  2.9      Home Exercise Plan   Plans to continue exercise at  Home (comment)    Frequency  Add 2 additional days to program exercise sessions.    Initial Home Exercises Provided  03/21/17       Nutrition:  Target Goals: Understanding of nutrition guidelines, daily intake of sodium 1500mg ,  cholesterol 200mg , calories 30% from fat and 7% or less from saturated fats, daily to have 5 or more servings of fruits and vegetables.  Biometrics: Pre Biometrics - 03/18/17 1431      Pre Biometrics   Height  5\' 7"  (1.702 m)    Weight  161 lb 12.8 oz (73.4 kg)    Waist Circumference  33 inches    Hip Circumference  39 inches    Waist to Hip Ratio  0.85 %    BMI (Calculated)  25.34    Triceps Skinfold  12 mm    % Body Fat  22.8 %    Grip Strength  56.46 kg    Flexibility  0 in    Single Leg Stand  31 seconds        Nutrition Therapy Plan and  Nutrition Goals: Nutrition Therapy & Goals - 05/25/17 1248      Nutrition Therapy   RD appointment deferred  Yes      Personal Nutrition Goals   Personal Goal #2  Patient would like to learn more about how to cook healthy meals. He is following a heart healthy diet low in carbs and sugar.     Additional Goals?  No    Comments  Patient continues to follow a heart healthy diet low in carbarhydrates and sugar. Will continue to monitor.       Intervention Plan   Intervention  Nutrition handout(s) given to patient.    Expected Outcomes  Short Term Goal: Understand basic principles of dietary content, such as calories, fat, sodium, cholesterol and nutrients.       Nutrition Assessments: Nutrition Assessments - 03/18/17 1440      MEDFICTS Scores   Pre Score  32       Nutrition Goals Re-Evaluation:   Nutrition Goals Discharge (Final Nutrition Goals Re-Evaluation):   Psychosocial: Target Goals: Acknowledge presence or absence of significant depression and/or stress, maximize coping skills, provide positive support system. Participant is able to verbalize types and ability to use techniques and skills needed for reducing stress and depression.  Initial Review & Psychosocial Screening: Initial Psych Review & Screening - 03/18/17 1436      Initial Review   Current issues with  None Identified      Family Dynamics   Good Support System?  Yes    Comments  Patient has a good support system. He is taking care of his wife who has had a CVA.       Barriers   Psychosocial barriers to participate in program  There are no identifiable barriers or psychosocial needs.      Screening Interventions   Interventions  Encouraged to exercise;Provide feedback about the scores to participant    Expected Outcomes  Long Term goal: The participant improves quality of Life and PHQ9 Scores as seen by post scores and/or verbalization of changes       Quality of Life Scores: Quality of Life -  03/18/17 1409      Quality of Life Scores   Health/Function Pre  23.37 %    Socioeconomic Pre  23 %    Psych/Spiritual Pre  21.79 %    Family Pre  19.33 %    GLOBAL Pre  22.56 %      Scores of 19 and below usually indicate a poorer quality of life in these areas.  A difference of  2-3 points is a clinically meaningful difference.  A difference of 2-3 points in the  total score of the Quality of Life Index has been associated with significant improvement in overall quality of life, self-image, physical symptoms, and general health in studies assessing change in quality of life.  PHQ-9: Recent Review Flowsheet Data    Depression screen [facility] 2022-03-10 03/18/2017 02/09/2017   Decreased Interest 0 0   Down, Depressed, Hopeless 0 0   PHQ - 2 Score 0 0   Altered sleeping 1 -   Tired, decreased energy 0 -   Change in appetite 0 -   Feeling bad or failure about yourself  0 -   Trouble concentrating 0 -   Moving slowly or fidgety/restless 0 -   Suicidal thoughts 0 -   PHQ-9 Score 1 -   Difficult doing work/chores Not difficult at all -     Interpretation of Total Score  Total Score Depression Severity:  1-4 = Minimal depression, 5-9 = Mild depression, 10-14 = Moderate depression, 15-19 = Moderately severe depression, 20-27 = Severe depression   Psychosocial Evaluation and Intervention: Psychosocial Evaluation - 03/18/17 1438      Psychosocial Evaluation & Interventions   Interventions  Encouraged to exercise with the program and follow exercise prescription;Stress management education;Relaxation education    Comments  Patient's initial QOL score was 22.56 and his PHQ-9 score was 1. No pychosocial issues identified.     Expected Outcomes  Patient will have no psychosocial issues identified at discharge.     Continue Psychosocial Services   No Follow up required       Psychosocial Re-Evaluation: Psychosocial Re-Evaluation    [place] Name 04/25/17 1514 05/25/17 1251           Psychosocial  Re-Evaluation   Current issues with  None Identified  None Identified      Comments  Patient's initial QOL score was 22.56 and his PHQ-9 score was 1. No issues identified.   Patient's initial QOL score was 22.56 and his PHQ-9 score was 1. No issues identified.       Expected Outcomes  Patient will have no psychosocial issues identifed at discharge.   Patient will have no psychosocial issues identifed at discharge.       Interventions  Stress management education;Encouraged to attend Cardiac Rehabilitation for the exercise;Relaxation education  Stress management education;Encouraged to attend Cardiac Rehabilitation for the exercise;Relaxation education      Continue Psychosocial Services   No Follow up required  No Follow up required         Psychosocial Discharge (Final Psychosocial Re-Evaluation): Psychosocial Re-Evaluation - 05/25/17 1251      Psychosocial Re-Evaluation   Current issues with  None Identified    Comments  Patient's initial QOL score was 22.56 and his PHQ-9 score was 1. No issues identified.     Expected Outcomes  Patient will have no psychosocial issues identifed at discharge.     Interventions  Stress management education;Encouraged to attend Cardiac Rehabilitation for the exercise;Relaxation education    Continue Psychosocial Services   No Follow up required       Vocational Rehabilitation: Provide vocational rehab assistance to qualifying candidates.   Vocational Rehab Evaluation & Intervention: Vocational Rehab - 03/18/17 1433      Initial Vocational Rehab Evaluation & Intervention   Assessment shows need for Vocational Rehabilitation  No       Education: Education Goals: Education classes will be provided on a weekly basis, covering required topics. Participant will state understanding/return demonstration of topics presented.  Learning Barriers/Preferences: Learning Barriers/Preferences -  03/18/17 1433      Learning Barriers/Preferences   Learning  Barriers  None    Learning Preferences  Audio;Written Material;Skilled Demonstration       Education Topics: Hypertension, Hypertension Reduction -Define heart disease and high blood pressure. Discus how high blood pressure affects the body and ways to reduce high blood pressure.   Exercise and Your Heart -Discuss why it is important to exercise, the FITT principles of exercise, normal and abnormal responses to exercise, and how to exercise safely.   Angina -Discuss definition of angina, causes of angina, treatment of angina, and how to decrease risk of having angina.   Cardiac Medications -Review what the following cardiac medications are used for, how they affect the body, and side effects that may occur when taking the medications.  Medications include Aspirin, Beta blockers, calcium channel blockers, ACE Inhibitors, angiotensin receptor blockers, diuretics, digoxin, and antihyperlipidemics.   CARDIAC REHAB PHASE II EXERCISE from 05/18/2017 in [place]  Date  03/24/17  Educator  DC  Instruction Review Code  2- Demonstrated Understanding      Congestive Heart Failure -Discuss the definition of CHF, how to live with CHF, the signs and symptoms of CHF, and how keep track of weight and sodium intake.   CARDIAC REHAB PHASE II EXERCISE from 05/18/2017 in [place]  Date  03/31/17  Educator  DC  Instruction Review Code  2- Demonstrated Understanding      Heart Disease and Intimacy -Discus the effect sexual activity has on the heart, how changes occur during intimacy as we age, and safety during sexual activity.   CARDIAC REHAB PHASE II EXERCISE from 05/18/2017 in [place]  Date  04/07/17  Educator  DC  Instruction Review Code  2- Demonstrated Understanding      Smoking Cessation / COPD -Discuss different methods to quit smoking, the health benefits of quitting smoking, and the definition of COPD.    CARDIAC REHAB PHASE II EXERCISE from 05/18/2017 in [place]  Date  04/14/17  Educator  DC  Instruction Review Code  2- Demonstrated Understanding      Nutrition I: Fats -Discuss the types of cholesterol, what cholesterol does to the heart, and how cholesterol levels can be controlled.   CARDIAC REHAB PHASE II EXERCISE from 05/18/2017 in [place]  Date  04/20/17  Educator  DC  Instruction Review Code  2- Demonstrated Understanding      Nutrition II: Labels -Discuss the different components of food labels and how to read food label   CARDIAC REHAB PHASE II EXERCISE from 05/18/2017 in [place]  Date  04/27/17  Educator  DC  Instruction Review Code  2- Demonstrated Understanding      Heart Parts/Heart Disease and PAD -Discuss the anatomy of the heart, the pathway of blood circulation through the heart, and these are affected by heart disease.   CARDIAC REHAB PHASE II EXERCISE from 05/18/2017 in [place]  Date  05/04/17  Educator  DC  Instruction Review Code  2- Demonstrated Understanding      Stress I: Signs and Symptoms -Discuss the causes of stress, how stress may lead to anxiety and depression, and ways to limit stress.   CARDIAC REHAB PHASE II EXERCISE from 05/18/2017 in [place]  Date  05/11/17  Educator  DJ  Instruction Review Code  2- Demonstrated Understanding      Stress II: Relaxation -Discuss  different types of relaxation techniques to limit stress.   CARDIAC REHAB PHASE II EXERCISE from 05/18/2017 in [place]  Date  05/18/17  Educator  DC  Instruction Review Code  2- Demonstrated Understanding      Warning Signs of Stroke / TIA -Discuss definition of a stroke, what the signs and symptoms are of a stroke, and how to identify when someone is having stroke.   Knowledge Questionnaire Score: Knowledge Questionnaire  Score - 03/18/17 1433      Knowledge Questionnaire Score   Pre Score  22/24       Core Components/Risk Factors/Patient Goals at Admission: Personal Goals and Risk Factors at Admission - 03/18/17 1434      Core Components/Risk Factors/Patient Goals on Admission    Weight Management  Weight Maintenance    Hypertension  Yes    Intervention  Provide education on lifestyle modifcations including regular physical activity/exercise, weight management, moderate sodium restriction and increased consumption of fresh fruit, vegetables, and low fat dairy, alcohol moderation, and smoking cessation.;Monitor prescription use compliance.    Expected Outcomes  Short Term: Continued assessment and intervention until BP is < 140/65mm HG in hypertensive participants. < 130/74mm HG in hypertensive participants with diabetes, heart failure or chronic kidney disease.;Long Term: Maintenance of blood pressure at goal levels.    Personal Goal Other  Yes    Personal Goal  Build upper body strength; improve grip strength; no more heart event.     Intervention  Patient will attend CR 3 days/week and suppment with exercise 2 days/week.     Expected Outcomes  Patient will meet his personal goals.        Core Components/Risk Factors/Patient Goals Review:  Goals and Risk Factor Review    Row Name 04/25/17 1507 05/25/17 1249           Core Components/Risk Factors/Patient Goals Review   Personal Goals Review  Weight Management/Obesity;Hypertension Build upper body strength and grip strength; no more heart events.   Weight Management/Obesity;Hypertension Increase upper body strength; grip strength; no more heart events.       Review  Patient has completed 16 sessions maintaining his weight. He is doing well in the program with progression. He says he is feeling stronger and is able to do more around the house. He was able to work out in his yard this weekend without difficulty. His b/p is controlled. Will continue to  monitor for progress.   Patient has 28 sessions maintaining his weight. He continues to do well in the program with progression. He continues to say he feels stronger and feels he is gaining upper body strength. He is doing more around the house and is able to do all of his activites without difficulty. Will continue to monitor for progress.       Expected Outcomes  Patient will continue to complete sessions and complete the program meeting his personal goals.   Patient will continue to complete sessions and complete the program meeting his personal goals.          Core Components/Risk Factors/Patient Goals at Discharge (Final Review):  Goals and Risk Factor Review - 05/25/17 1249      Core Components/Risk Factors/Patient Goals Review   Personal Goals Review  Weight Management/Obesity;Hypertension Increase upper body strength; grip strength; no more heart events.     Review  Patient has 28 sessions maintaining his weight. He continues to do well in the program with progression. He continues to say he  feels stronger and feels he is gaining upper body strength. He is doing more around the house and is able to do all of his activites without difficulty. Will continue to monitor for progress.     Expected Outcomes  Patient will continue to complete sessions and complete the program meeting his personal goals.        ITP Comments: ITP Comments    Row Name 03/30/17 1412           ITP Comments  Patient new to program. He has completed 5 sessions. Will continue to monitor for progress.           Comments: ITP 30 Day REVIEW Patient doing well in the program. Will continue to monitor for progress.

## 2017-05-27 ENCOUNTER — Encounter (HOSPITAL_COMMUNITY)
Admission: RE | Admit: 2017-05-27 | Discharge: 2017-05-27 | Disposition: A | Discharge status: Home / Self Care | Payer: PPO | Source: Ambulatory Visit | Attending: Cardiology | Admitting: Cardiology

## 2017-05-27 DIAGNOSIS — I214 Non-ST elevation (NSTEMI) myocardial infarction: Secondary | ICD-10-CM | POA: Diagnosis not present

## 2017-05-27 NOTE — Progress Notes (Signed)
Daily Session Note  Patient Details  Name: Christopher Booth MRN: [id number] Date of Birth: 08/27/1940 Referring Provider:     [place] from 03/18/2017 in [place]  Referring Provider  [name]      Encounter Date: 05/27/2017  Check In: Session Check In - 05/27/17 0815      Check-In   Location  [facility]    Staff Present  Russella Dar, MS, EP, [facility], Exercise Physiologist;Gregory Luther Parody, BS, EP, Exercise Physiologist    Supervising physician immediately available to respond to emergencies  See telemetry face sheet for immediately available MD    Medication changes reported      No    Fall or balance concerns reported     No    Tobacco Cessation  No Change    Warm-up and Cool-down  Performed as group-led instruction    Resistance Training Performed  Yes    VAD Patient?  No      Pain Assessment   Currently in Pain?  No/denies    Pain Score  0-No pain    Multiple Pain Sites  No       Capillary Blood Glucose: No results found for this or any previous visit (from the past 24 hour(s)).    Social History   Tobacco Use  Smoking Status Never Smoker  Smokeless Tobacco Never Used    Goals Met:  Independence with exercise equipment Exercise tolerated well No report of cardiac concerns or symptoms Strength training completed today  Goals Unmet:  Not Applicable  Comments: Check out: 0915   [name] is Medical Director for [place] and Pulmonary Rehab.

## 2017-05-30 ENCOUNTER — Encounter (HOSPITAL_COMMUNITY)
Admission: RE | Admit: 2017-05-30 | Discharge: 2017-05-30 | Disposition: A | Discharge status: Home / Self Care | Payer: PPO | Source: Ambulatory Visit | Attending: Cardiology | Admitting: Cardiology

## 2017-05-30 DIAGNOSIS — Z951 Presence of aortocoronary bypass graft: Secondary | ICD-10-CM

## 2017-05-30 DIAGNOSIS — I214 Non-ST elevation (NSTEMI) myocardial infarction: Secondary | ICD-10-CM | POA: Diagnosis not present

## 2017-05-30 NOTE — Progress Notes (Signed)
Daily Session Note  Patient Details  Name: HAMZAH SAVOCA MRN: [id number] Date of Birth: 05/25/1940 Referring Provider:     [place] from 03/18/2017 in [place]  Referring Provider  [name]      Encounter Date: 05/30/2017  Check In: Session Check In - 05/30/17 1541      Check-In   Location  [facility]    Staff Present  [name], MS, EP, [facility], Exercise Physiologist;Kwane Rohl Luther Parody, BS, EP, Exercise Physiologist;Debra Wynetta Emery, RN, BSN    Supervising physician immediately available to respond to emergencies  See telemetry face sheet for immediately available MD    Medication changes reported      No    Fall or balance concerns reported     No    Warm-up and Cool-down  Performed as group-led instruction    Resistance Training Performed  Yes    VAD Patient?  No      Pain Assessment   Currently in Pain?  No/denies    Pain Score  0-No pain    Multiple Pain Sites  No       Capillary Blood Glucose: No results found for this or any previous visit (from the past 24 hour(s)).    Social History   Tobacco Use  Smoking Status Never Smoker  Smokeless Tobacco Never Used    Goals Met:  Independence with exercise equipment Exercise tolerated well No report of cardiac concerns or symptoms Strength training completed today  Goals Unmet:  Not Applicable  Comments: Check out 445   [name] is Medical Director for [place] and Pulmonary Rehab.

## 2017-06-01 ENCOUNTER — Encounter (HOSPITAL_COMMUNITY)
Admission: RE | Admit: 2017-06-01 | Discharge: 2017-06-01 | Disposition: A | Discharge status: Home / Self Care | Payer: PPO | Source: Ambulatory Visit | Attending: Cardiology | Admitting: Cardiology

## 2017-06-01 DIAGNOSIS — I214 Non-ST elevation (NSTEMI) myocardial infarction: Secondary | ICD-10-CM | POA: Diagnosis not present

## 2017-06-01 DIAGNOSIS — Z951 Presence of aortocoronary bypass graft: Secondary | ICD-10-CM

## 2017-06-01 NOTE — Progress Notes (Signed)
Daily Session Note  Patient Details  Name: Christopher Booth MRN: [id number] Date of Birth: [birth year] Referring Provider:     [place] from 03/18/2017 in [place]  Referring Provider  [name]      Encounter Date: 06/01/2017  Check In: Session Check In - 06/01/17 1545      Check-In   Location  [facility]    Staff Present  [name], MS, EP, [facility], Exercise Physiologist;Gregory Luther Parody, BS, EP, Exercise Physiologist;Anuhea Gassner Wynetta Emery, RN, BSN    Supervising physician immediately available to respond to emergencies  See telemetry face sheet for immediately available MD    Medication changes reported      No    Fall or balance concerns reported     No    Warm-up and Cool-down  Performed as group-led instruction    Resistance Training Performed  Yes    VAD Patient?  No      Pain Assessment   Currently in Pain?  No/denies    Pain Score  0-No pain    Multiple Pain Sites  No       Capillary Blood Glucose: No results found for this or any previous visit (from the past 24 hour(s)).    Social History   Tobacco Use  Smoking Status Never Smoker  Smokeless Tobacco Never Used    Goals Met:  Independence with exercise equipment Exercise tolerated well No report of cardiac concerns or symptoms Strength training completed today  Goals Unmet:  Not Applicable  Comments: Check out 1645.   [name] is Medical Director for [facility] Cardiac and Pulmonary Rehab.

## 2017-06-03 ENCOUNTER — Encounter (HOSPITAL_COMMUNITY)
Admission: RE | Admit: 2017-06-03 | Discharge: 2017-06-03 | Disposition: A | Discharge status: Home / Self Care | Payer: PPO | Source: Ambulatory Visit | Attending: Cardiology | Admitting: Cardiology

## 2017-06-03 DIAGNOSIS — I214 Non-ST elevation (NSTEMI) myocardial infarction: Secondary | ICD-10-CM | POA: Diagnosis not present

## 2017-06-03 DIAGNOSIS — Z951 Presence of aortocoronary bypass graft: Secondary | ICD-10-CM

## 2017-06-03 NOTE — Progress Notes (Signed)
Daily Session Note  Patient Details  Name: Christopher Booth MRN: [id number] Date of Birth: [birth year] Referring Provider:     [place] from 03/18/2017 in [place]  Referring Provider  [name]      Encounter Date: 06/03/2017  Check In: Session Check In - 06/03/17 1533      Check-In   Location  [facility]    Staff Present  [name], MS, EP, [facility], Exercise Physiologist;Dilia Alemany Luther Parody, BS, EP, Exercise Physiologist;Debra Wynetta Emery, RN, BSN    Supervising physician immediately available to respond to emergencies  See telemetry face sheet for immediately available MD    Medication changes reported      No    Fall or balance concerns reported     No    Warm-up and Cool-down  Performed as group-led instruction    Resistance Training Performed  Yes    VAD Patient?  No      Pain Assessment   Currently in Pain?  No/denies    Pain Score  0-No pain    Multiple Pain Sites  No       Capillary Blood Glucose: No results found for this or any previous visit (from the past 24 hour(s)).    Social History   Tobacco Use  Smoking Status Never Smoker  Smokeless Tobacco Never Used    Goals Met:  Independence with exercise equipment Exercise tolerated well No report of cardiac concerns or symptoms Strength training completed today  Goals Unmet:  Not Applicable  Comments: Check out 445   [name] is Medical Director for [place] and Pulmonary Rehab.

## 2017-06-06 ENCOUNTER — Encounter (HOSPITAL_COMMUNITY)
Admission: RE | Admit: 2017-06-06 | Discharge: 2017-06-06 | Disposition: A | Discharge status: Home / Self Care | Payer: PPO | Source: Ambulatory Visit | Attending: Cardiology | Admitting: Cardiology

## 2017-06-06 DIAGNOSIS — I214 Non-ST elevation (NSTEMI) myocardial infarction: Secondary | ICD-10-CM | POA: Diagnosis not present

## 2017-06-06 DIAGNOSIS — Z951 Presence of aortocoronary bypass graft: Secondary | ICD-10-CM

## 2017-06-06 NOTE — Progress Notes (Signed)
Daily Session Note  Patient Details  Name: Christopher Booth MRN: [id number] Date of Birth: [birth year] Referring Provider:     [place] from 03/18/2017 in [place]  Referring Provider  [name]      Encounter Date: 06/06/2017  Check In: Session Check In - 06/06/17 1545      Check-In   Location  [facility]    Staff Present  [name], MS, EP, [facility], Exercise Physiologist;[name], RN, BSN    Supervising physician immediately available to respond to emergencies  See telemetry face sheet for immediately available MD    Medication changes reported      No    Fall or balance concerns reported     No    Warm-up and Cool-down  Performed as group-led instruction    Resistance Training Performed  Yes    VAD Patient?  No      Pain Assessment   Currently in Pain?  No/denies    Pain Score  0-No pain    Multiple Pain Sites  No       Capillary Blood Glucose: No results found for this or any previous visit (from the past 24 hour(s)).    Social History   Tobacco Use  Smoking Status Never Smoker  Smokeless Tobacco Never Used    Goals Met:  Independence with exercise equipment Exercise tolerated well No report of cardiac concerns or symptoms Strength training completed today  Goals Unmet:  Not Applicable  Comments: Check out 1645.   [name] is Medical Director for [facility] Cardiac and Pulmonary Rehab.

## 2017-06-08 ENCOUNTER — Encounter (HOSPITAL_COMMUNITY)
Admission: RE | Admit: 2017-06-08 | Discharge: 2017-06-08 | Disposition: A | Discharge status: Home / Self Care | Payer: PPO | Source: Ambulatory Visit | Attending: Cardiology | Admitting: Cardiology

## 2017-06-08 DIAGNOSIS — I214 Non-ST elevation (NSTEMI) myocardial infarction: Secondary | ICD-10-CM

## 2017-06-08 DIAGNOSIS — Z951 Presence of aortocoronary bypass graft: Secondary | ICD-10-CM

## 2017-06-08 NOTE — Progress Notes (Signed)
Daily Session Note  Patient Details  Name: Christopher Booth MRN: [id number] Date of Birth: Apr 22, 1940 Referring Provider:     [name] from 03/18/2017 in [name]  Referring Provider  Dr. Domenic Booth      Encounter Date: 06/08/2017  Check In: Session Check In - 06/08/17 1556      Check-In   Location  [facility]    Staff Present  Aundra Dubin, RN, BSN;Levelle Edelen Luther Parody, BS, EP, Exercise Physiologist    Supervising physician immediately available to respond to emergencies  See telemetry face sheet for immediately available MD    Medication changes reported      No    Fall or balance concerns reported     No    Warm-up and Cool-down  Performed as group-led instruction    Resistance Training Performed  Yes    VAD Patient?  No      Pain Assessment   Currently in Pain?  No/denies    Pain Score  0-No pain    Multiple Pain Sites  No       Capillary Blood Glucose: No results found for this or any previous visit (from the past 24 hour(s)).    Social History   Tobacco Use  Smoking Status Never Smoker  Smokeless Tobacco Never Used    Goals Met:  Independence with exercise equipment Exercise tolerated well No report of cardiac concerns or symptoms Strength training completed today  Goals Unmet:  Not Applicable  Comments: Check out 445   Dr. Kate Booth is Medical Director for [place] and Pulmonary Rehab.

## 2017-06-10 ENCOUNTER — Encounter (HOSPITAL_COMMUNITY)
Admission: RE | Admit: 2017-06-10 | Discharge: 2017-06-10 | Disposition: A | Discharge status: Home / Self Care | Payer: PPO | Source: Ambulatory Visit | Attending: Cardiology | Admitting: Cardiology

## 2017-06-10 VITALS — Ht 67.0 in | Wt 162.3 lb

## 2017-06-10 DIAGNOSIS — Z951 Presence of aortocoronary bypass graft: Secondary | ICD-10-CM

## 2017-06-10 DIAGNOSIS — I214 Non-ST elevation (NSTEMI) myocardial infarction: Secondary | ICD-10-CM | POA: Diagnosis not present

## 2017-06-10 NOTE — Progress Notes (Signed)
Daily Session Note  Patient Details  Name: Christopher Booth MRN: [id number] Date of Birth: 06-01-1940 Referring Provider:     [place] from 03/18/2017 in [place]  Referring Provider  [name]      Encounter Date: 06/10/2017  Check In: Session Check In - 06/10/17 1543      Check-In   Location  [facility]    Staff Present  Aundra Dubin, RN, BSN;Gregory Luther Parody, BS, EP, Exercise Physiologist    Supervising physician immediately available to respond to emergencies  See telemetry face sheet for immediately available MD    Medication changes reported      No    Fall or balance concerns reported     No    Warm-up and Cool-down  Performed as group-led instruction    Resistance Training Performed  Yes    VAD Patient?  No      Pain Assessment   Currently in Pain?  No/denies    Pain Score  0-No pain    Multiple Pain Sites  No       Capillary Blood Glucose: No results found for this or any previous visit (from the past 24 hour(s)).    Social History   Tobacco Use  Smoking Status Never Smoker  Smokeless Tobacco Never Used    Goals Met:  Independence with exercise equipment Exercise tolerated well No report of cardiac concerns or symptoms Strength training completed today  Goals Unmet:  Not Applicable  Comments: Check out 1645.   [name] is Medical Director for [facility] Cardiac and Pulmonary Rehab.

## 2017-06-14 DIAGNOSIS — E782 Mixed hyperlipidemia: Secondary | ICD-10-CM | POA: Diagnosis not present

## 2017-06-14 DIAGNOSIS — I129 Hypertensive chronic kidney disease with stage 1 through stage 4 chronic kidney disease, or unspecified chronic kidney disease: Secondary | ICD-10-CM | POA: Diagnosis not present

## 2017-06-14 DIAGNOSIS — I4891 Unspecified atrial fibrillation: Secondary | ICD-10-CM | POA: Diagnosis not present

## 2017-06-14 DIAGNOSIS — Z0000 Encounter for general adult medical examination without abnormal findings: Secondary | ICD-10-CM | POA: Diagnosis not present

## 2017-06-14 DIAGNOSIS — E2609 Other primary hyperaldosteronism: Secondary | ICD-10-CM | POA: Diagnosis not present

## 2017-06-14 DIAGNOSIS — N62 Hypertrophy of breast: Secondary | ICD-10-CM | POA: Diagnosis not present

## 2017-06-14 DIAGNOSIS — N182 Chronic kidney disease, stage 2 (mild): Secondary | ICD-10-CM | POA: Diagnosis not present

## 2017-06-14 DIAGNOSIS — M1711 Unilateral primary osteoarthritis, right knee: Secondary | ICD-10-CM | POA: Diagnosis not present

## 2017-06-17 NOTE — Progress Notes (Signed)
Cardiac Individual Treatment Plan  Patient Details  Name: Christopher Booth MRN: [id number] Date of Birth: August 23, 1940 Referring Provider:     [place] from 03/18/2017 in [place]  Referring Provider  [name]      Initial Encounter Date:    CARDIAC REHAB PHASE II ORIENTATION from 03/18/2017 in [place]  Date  03/18/17  Referring Provider  [name]      Visit Diagnosis: S/P CABG x 3  NSTEMI (non-ST elevated myocardial infarction) [facility])  Patient's Home Medications on Admission:  Current Outpatient Medications:  .  acetaminophen (TYLENOL) 500 MG tablet, Take 500 mg by mouth every 6 (six) hours as needed for mild pain. , Disp: , Rfl:  .  aspirin EC 81 MG EC tablet, Take 1 tablet (81 mg total) by mouth daily., Disp: , Rfl:  .  atorvastatin (LIPITOR) 20 MG tablet, Take 20 mg by mouth daily., Disp: , Rfl:  .  eplerenone (INSPRA) 25 MG tablet, Take 1 tablet (25 mg total) by mouth daily. (Patient taking differently: Take 50 mg by mouth 2 (two) times daily. ), Disp: , Rfl:  .  hydrochlorothiazide (HYDRODIURIL) 25 MG tablet, Take 25 mg by mouth daily., Disp: , Rfl:  .  metoprolol tartrate (LOPRESSOR) 25 MG tablet, TAKE (1) TABLET BY MOUTH TWICE DAILY., Disp: 180 tablet, Rfl: 3 .  Multiple Vitamins-Minerals (MULTI COMPLETE PO), Take by mouth daily., Disp: , Rfl:   Past Medical History: Past Medical History:  Diagnosis Date  . Arthritis   . 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 Essential hypertension   . Gynecomastia   . Hyperaldosteronism   . Postoperative atrial fibrillation ([place])   . Spinal stenosis   . Temporomandibular joint disease   . Wears glasses     Tobacco Use: Social History   Tobacco Use  Smoking Status Never Smoker  Smokeless Tobacco Never Used    Labs: Recent Review Flowsheet Data    Labs for ITP Cardiac and Pulmonary Rehab Latest Ref  Rng & Units 01/12/2017 01/12/2017 01/12/2017 01/12/2017 01/13/2017   Cholestrol 0 - 200 mg/dL - - - - -   LDLCALC 0 - 99 mg/dL - - - - -   HDL >40 mg/dL - - - - -   Trlycerides <150 mg/dL - - - - -   Hemoglobin A1c 4.8 - 5.6 % - - - - -   PHART 7.350 - 7.450 7.448 7.326(L) 7.371 - -   PCO2ART 32.0 - 48.0 mmHg 29.9(L) 43.1 39.2 - -   HCO3 20.0 - 28.0 mmol/L 20.9 22.5 22.6 - -   TCO2 22 - 32 mmol/L '22 24 24 24 22   ' ACIDBASEDEF 0.0 - 2.0 mmol/L 3.0(H) 3.0(H) 2.0 - -   O2SAT % 100.0 99.0 100.0 - -      Capillary Blood Glucose: Lab Results  Component Value Date   GLUCAP 107 (H) 01/17/2017   GLUCAP 100 (H) 01/16/2017   GLUCAP 124 (H) 01/16/2017   GLUCAP 106 (H) 01/16/2017   GLUCAP 88 01/16/2017     Exercise Target Goals:    Exercise Program Goal: Individual exercise prescription set using results from initial 6 min walk test and THRR while considering  patient's activity barriers and safety.   Exercise Prescription Goal: Starting with aerobic activity 30 plus minutes a day, 3 days per week for initial exercise prescription. Provide home exercise prescription and guidelines that  participant acknowledges understanding prior to discharge.  Activity Barriers & Risk Stratification: Activity Barriers & Cardiac Risk Stratification - 03/18/17 1409      Activity Barriers & Cardiac Risk Stratification   Activity Barriers  Back Problems    Cardiac Risk Stratification  High       6 Minute Walk: 6 Minute Walk    Row Name 03/18/17 1408 06/14/17 0816       6 Minute Walk   Phase  Initial  Discharge    Distance  1500 feet  1800 feet    Distance % Change  0 %  20 %    Distance Feet Change  0 ft  300 ft    Walk Time  6 minutes  6 minutes    # of Rest Breaks  0  0    MPH  2.84  3.4    METS  3.17  3.61    RPE  11  11    Perceived Dyspnea   9  9    VO2 Peak  10.71  12.62    Symptoms  No  No    Resting HR  85 bpm  73 bpm    Resting BP  112/86  114/60    Resting Oxygen Saturation    96 %  96 %    Exercise Oxygen Saturation  during 6 min walk  93 %  91 %    Max Ex. HR  99 bpm  99 bpm    Max Ex. BP  134/84  138/75    2 Minute Post BP  120/80  110/66       Oxygen Initial Assessment: Oxygen Initial Assessment - 03/18/17 1447      Home Oxygen   Home Oxygen Device  None    Sleep Oxygen Prescription  None    Home Exercise Oxygen Prescription  None    Home at Rest Exercise Oxygen Prescription  None      Initial 6 min Walk   Oxygen Used  None      Program Oxygen Prescription   Program Oxygen Prescription  None       Oxygen Re-Evaluation:   Oxygen Discharge (Final Oxygen Re-Evaluation):   Initial Exercise Prescription: Initial Exercise Prescription - 03/18/17 1400      Date of Initial Exercise RX and Referring Provider   Date  03/18/17    Referring Provider  [name]      Treadmill   MPH  1.8    Grade  0    Minutes  15    METs  2.3      Recumbant Elliptical   Level  1    RPM  49    Watts  62    Minutes  20    METs  3.4      Prescription Details   Frequency (times per week)  3    Duration  Progress to 30 minutes of continuous aerobic without signs/symptoms of physical distress      Intensity   THRR 40-80% of Max Heartrate  109-120-132    Ratings of Perceived Exertion  11-13    Perceived Dyspnea  0-4      Progression   Progression  Continue progressive overload as per policy without signs/symptoms or physical distress.      Resistance Training   Training Prescription  Yes    Weight  1    Reps  10-15       Perform Capillary Blood Glucose checks as  needed.  Exercise Prescription Changes:  Exercise Prescription Changes    Row Name 03/22/17 0700 03/30/17 1400 04/12/17 0800 04/21/17 1400 05/16/17 1400     Response to Exercise   Blood Pressure (Admit)  -  124/70  104/70  110/66  110/64   Blood Pressure (Exercise)  -  140/92  120/80  130/70  140/80   Blood Pressure (Exit)  -  110/70  110/62  90/50  148/80   Heart Rate (Admit)  -   75 bpm  72 bpm  92 bpm  75 bpm   Heart Rate (Exercise)  -  777 bpm  91 bpm  115 bpm  124 bpm   Heart Rate (Exit)  -  83 bpm  81 bpm  80 bpm  85 bpm   Rating of Perceived Exertion (Exercise)  -  '11  10  12  11   ' Duration  -  Progress to 30 minutes of  aerobic without signs/symptoms of physical distress  Progress to 30 minutes of  aerobic without signs/symptoms of physical distress  Progress to 30 minutes of  aerobic without signs/symptoms of physical distress  Progress to 30 minutes of  aerobic without signs/symptoms of physical distress   Intensity  -  THRR New 103-116-130  THRR New 101-115-130  THRR unchanged 101-115-130  THRR unchanged 103-116-130     Progression   Progression  -  Continue to progress workloads to maintain intensity without signs/symptoms of physical distress.  Continue to progress workloads to maintain intensity without signs/symptoms of physical distress.  Continue to progress workloads to maintain intensity without signs/symptoms of physical distress.  Continue to progress workloads to maintain intensity without signs/symptoms of physical distress.     Resistance Training   Training Prescription  Yes  Yes  Yes  Yes  Yes   Weight  '1  1  2  2  3   ' Reps  10-15  10-15  10-15  10-15  10-15     Treadmill   MPH  1.8  2.4  2.9  3  3.1   Grade  0  0  0  0  0   Minutes  '15  20  20  20  20   ' METs  2.3  2.84  3.22  3.29  3.5     Recumbant Elliptical   Level  '1  1  1  ' -  -   RPM  49  63  60  -  -   Watts  62  85  90  -  -   Minutes  '20  20  20  ' -  -   METs  3.4  4.5  5.2  -  -     Elliptical   Level  -  -  -  1  1   Speed  -  -  -  35  38   Minutes  -  -  -  15  15   METs  -  -  -  2.9  2.9     Home Exercise Plan   Plans to continue exercise at  Home (comment)  Home (comment)  Home (comment)  Home (comment)  Home (comment)   Frequency  Add 2 additional days to program exercise sessions.  Add 2 additional days to program exercise sessions.  Add 2 additional days to program  exercise sessions.  Add 2 additional days to program exercise sessions.  Add 2 additional days to program exercise sessions.  Initial Home Exercises Provided  03/21/17  03/21/17  03/21/17  03/21/17  03/21/17   Row Name 05/19/17 0700 05/31/17 0700           Response to Exercise   Blood Pressure (Admit)  110/64  114/70      Blood Pressure (Exercise)  140/80  136/74      Blood Pressure (Exit)  148/80  106/70      Heart Rate (Admit)  75 bpm  67 bpm      Heart Rate (Exercise)  124 bpm  119 bpm      Heart Rate (Exit)  85 bpm  84 bpm      Rating of Perceived Exertion (Exercise)  11  11      Duration  Progress to 30 minutes of  aerobic without signs/symptoms of physical distress  Progress to 30 minutes of  aerobic without signs/symptoms of physical distress      Intensity  THRR unchanged 103-116-130  THRR unchanged 98-113-129        Progression   Progression  Continue to progress workloads to maintain intensity without signs/symptoms of physical distress.  Continue to progress workloads to maintain intensity without signs/symptoms of physical distress.        Resistance Training   Training Prescription  Yes  Yes      Weight  3  5      Reps  10-15  10-15        Treadmill   MPH  3.1  3.1      Grade  0  0.5      Minutes  20  20      METs  3.5  3.56        Elliptical   Level  1  1      Speed  38  35      Minutes  15  15      METs  2.9  2.7        Home Exercise Plan   Plans to continue exercise at  Home (comment)  Home (comment)      Frequency  Add 2 additional days to program exercise sessions.  Add 2 additional days to program exercise sessions.      Initial Home Exercises Provided  03/21/17  03/21/17         Exercise Comments:  Exercise Comments    Row Name 03/22/17 0747 03/30/17 1405 04/12/17 0829 04/21/17 1501 05/16/17 1413   Exercise Comments  Patient received the take home exercise plan 03/21/2017. THR was addressed as were safety guidelines for being active when not in CR.  Patient demonstrated and understanding and was encouraged to ask any future questions as they arise.   Patient is doing well in CR and has already progressed his speed on the treadmill as well as his RPMs and watts on the recumbent elliptical. Patient is progressing well and has stated taht he feels well in the program. We are working towards gaining the patients goals.   Patient is doing well in CR and has increased substaintially on the treadmill to 2.45mh and has maintained his level on the recumbent elliptical. Patient states that he still feels well in the program as we continue to work towards patients goals.   Patient is doing very well in CR and has increased his speed on the treadmill. Patient also has been moved from the recumbent elliptical to the stand up elliptical in order for a harder workout. Patient has been doing  well on the stand up version of the elliptical. Patient states that he has gained stamina since beginning the program.   Patient continues to do well in CR. Patient has increased his speed on the treadmill to 3.34mh and has also maintained his level and watts on the stand up elliptical. Patient states that he feels more endurance since beginnning the program and that the elliptical feels like a great workout for him. Patient will continued to be monitored throughout the remainder of the program.    [place] 05/31/17 0740           Exercise Comments  Patient is doing well in CR. Patient has increased his grade on the treadmill machine and has also maintained his level on the recumbent elliptical. Patient has stated to me that he feels that he has more energy and feels better after finishing each session.           Exercise Goals and Review:  Exercise Goals    Row Name 03/18/17 1409             Exercise Goals   Increase Physical Activity  Yes       Intervention  Provide advice, education, support and counseling about physical activity/exercise needs.;Develop an  individualized exercise prescription for aerobic and resistive training based on initial evaluation findings, risk stratification, comorbidities and participant's personal goals.       Expected Outcomes  Short Term: Attend rehab on a regular basis to increase amount of physical activity.       Increase Strength and Stamina  Yes       Intervention  Provide advice, education, support and counseling about physical activity/exercise needs.;Develop an individualized exercise prescription for aerobic and resistive training based on initial evaluation findings, risk stratification, comorbidities and participant's personal goals.       Expected Outcomes  Short Term: Increase workloads from initial exercise prescription for resistance, speed, and METs.       Able to understand and use rate of perceived exertion (RPE) scale  Yes       Intervention  Provide education and explanation on how to use RPE scale       Expected Outcomes  Short Term: Able to use RPE daily in rehab to express subjective intensity level;Long Term:  Able to use RPE to guide intensity level when exercising independently       Able to understand and use Dyspnea scale  Yes       Intervention  Provide education and explanation on how to use Dyspnea scale       Expected Outcomes  Long Term: Able to use Dyspnea scale to guide intensity level when exercising independently;Short Term: Able to use Dyspnea scale daily in rehab to express subjective sense of shortness of breath during exertion       Knowledge and understanding of Target Heart Rate Range (THRR)  Yes       Intervention  Provide education and explanation of THRR including how the numbers were predicted and where they are located for reference       Expected Outcomes  Short Term: Able to state/look up THRR;Long Term: Able to use THRR to govern intensity when exercising independently;Short Term: Able to use daily as guideline for intensity in rehab       Able to check pulse independently   Yes       Intervention  Provide education and demonstration on how to check pulse in carotid and radial arteries.;Review the importance  of being able to check your own pulse for safety during independent exercise       Expected Outcomes  Short Term: Able to explain why pulse checking is important during independent exercise;Long Term: Able to check pulse independently and accurately       Understanding of Exercise Prescription  Yes       Intervention  Provide education, explanation, and written materials on patient's individual exercise prescription       Expected Outcomes  Short Term: Able to explain program exercise prescription;Long Term: Able to explain home exercise prescription to exercise independently          Exercise Goals Re-Evaluation : Exercise Goals Re-Evaluation    Row Name 03/30/17 1403 04/21/17 1459 05/16/17 1412         Exercise Goal Re-Evaluation   Exercise Goals Review  Increase Physical Activity;Able to understand and use rate of perceived exertion (RPE) scale;Knowledge and understanding of Target Heart Rate Range (THRR);Understanding of Exercise Prescription;Able to understand and use Dyspnea scale  Increase Physical Activity;Able to understand and use rate of perceived exertion (RPE) scale;Knowledge and understanding of Target Heart Rate Range (THRR);Understanding of Exercise Prescription;Able to understand and use Dyspnea scale  Increase Physical Activity;Able to understand and use rate of perceived exertion (RPE) scale;Knowledge and understanding of Target Heart Rate Range (THRR);Understanding of Exercise Prescription;Able to understand and use Dyspnea scale     Comments  Patient is doing well in CR and has already progressed his speed on the treadmill as well as his RPMs and watts on the recumbent elliptical. Patient is progressing well and has stated taht he feels well in the program. We are working towards gaining the patients goals.   Patient is doing very well in CR and  has increased his speed on the treadmill. Patient also has been moved from the recumbent elliptical to the stand up elliptical in order for a harder workout. Patient has been doing well on the stand up version of the elliptical. Patient states that he has gained stamina since beginning the program.   Patient continues to do well in CR. Patient has increased his speed on the treadmill to 3.30mh and has also maintained his level and watts on the stand up elliptical. Patient states that he feels more endurance since beginnning the program and that the elliptical feels like a great workout for him. Patient will continued to be monitored throughout the remainder of the program.      Expected Outcomes  Patient wishes to gain upper body strength and to have no more heart events in the future.   Patient wishes to gain upper body strength and to have no more heart events in the future.   Patient wishes to gain upper body strength and to have no more heart events in the future.          Discharge Exercise Prescription (Final Exercise Prescription Changes): Exercise Prescription Changes - 05/31/17 0700      Response to Exercise   Blood Pressure (Admit)  114/70    Blood Pressure (Exercise)  136/74    Blood Pressure (Exit)  106/70    Heart Rate (Admit)  67 bpm    Heart Rate (Exercise)  119 bpm    Heart Rate (Exit)  84 bpm    Rating of Perceived Exertion (Exercise)  11    Duration  Progress to 30 minutes of  aerobic without signs/symptoms of physical distress    Intensity  THRR unchanged [phone]  Progression   Progression  Continue to progress workloads to maintain intensity without signs/symptoms of physical distress.      Resistance Training   Training Prescription  Yes    Weight  5    Reps  10-15      Treadmill   MPH  3.1    Grade  0.5    Minutes  20    METs  3.56      Elliptical   Level  1    Speed  35    Minutes  15    METs  2.7      Home Exercise Plan   Plans to continue  exercise at  Home (comment)    Frequency  Add 2 additional days to program exercise sessions.    Initial Home Exercises Provided  03/21/17       Nutrition:  Target Goals: Understanding of nutrition guidelines, daily intake of sodium <1570m, cholesterol <2076m calories 30% from fat and 7% or less from saturated fats, daily to have 5 or more servings of fruits and vegetables.  Biometrics: Pre Biometrics - 03/18/17 1431      Pre Biometrics   Height  '5\' 7"'  (1.702 m)    Weight  161 lb 12.8 oz (73.4 kg)    Waist Circumference  33 inches    Hip Circumference  39 inches    Waist to Hip Ratio  0.85 %    BMI (Calculated)  25.34    Triceps Skinfold  12 mm    % Body Fat  22.8 %    Grip Strength  56.46 kg    Flexibility  0 in    Single Leg Stand  31 seconds      Post Biometrics - 06/14/17 0817       Post  Biometrics   Height  '5\' 7"'  (1.702 m)    Weight  162 lb 5.2 oz (73.6 kg)    Waist Circumference  33 inches    Hip Circumference  38 inches    Waist to Hip Ratio  0.87 %    BMI (Calculated)  25.42    Triceps Skinfold  12 mm    % Body Fat  22.9 %    Grip Strength  58.73 kg    Flexibility  0 in    Single Leg Stand  38 seconds       Nutrition Therapy Plan and Nutrition Goals: Nutrition Therapy & Goals - 05/25/17 1248      Nutrition Therapy   RD appointment deferred  Yes      Personal Nutrition Goals   Personal Goal #2  Patient would like to learn more about how to cook healthy meals. He is following a heart healthy diet low in carbs and sugar.     Additional Goals?  No    Comments  Patient continues to follow a heart healthy diet low in carbarhydrates and sugar. Will continue to monitor.       Intervention Plan   Intervention  Nutrition handout(s) given to patient.    Expected Outcomes  Short Term Goal: Understand basic principles of dietary content, such as calories, fat, sodium, cholesterol and nutrients.       Nutrition Assessments: Nutrition Assessments - 06/16/17  1644      MEDFICTS Scores   Pre Score  32    Post Score  0    Score Difference  -32       Nutrition Goals Re-Evaluation:   Nutrition Goals Discharge (Final  Nutrition Goals Re-Evaluation):   Psychosocial: Target Goals: Acknowledge presence or absence of significant depression and/or stress, maximize coping skills, provide positive support system. Participant is able to verbalize types and ability to use techniques and skills needed for reducing stress and depression.  Initial Review & Psychosocial Screening: Initial Psych Review & Screening - 03/18/17 1436      Initial Review   Current issues with  None Identified      Family Dynamics   Good Support System?  Yes    Comments  Patient has a good support system. He is taking care of his wife who has had a CVA.       Barriers   Psychosocial barriers to participate in program  There are no identifiable barriers or psychosocial needs.      Screening Interventions   Interventions  Encouraged to exercise;Provide feedback about the scores to participant    Expected Outcomes  Long Term goal: The participant improves quality of Life and PHQ9 Scores as seen by post scores and/or verbalization of changes       Quality of Life Scores: Quality of Life - 06/14/17 0818      Quality of Life Scores   Health/Function Pre  23.37 %    Health/Function Post  27.14 %    Health/Function % Change  16.13 %    Socioeconomic Pre  23 %    Socioeconomic Post  24.29 %    Socioeconomic % Change   5.61 %    Psych/Spiritual Pre  21.79 %    Psych/Spiritual Post  22.5 %    Psych/Spiritual % Change  3.26 %    Family Pre  19.33 %    Family Post  26.63 %    Family % Change  37.77 %    GLOBAL Pre  22.56 %    GLOBAL Post  25.63 %    GLOBAL % Change  13.61 %      Scores of 19 and below usually indicate a poorer quality of life in these areas.  A difference of  2-3 points is a clinically meaningful difference.  A difference of 2-3 points in the total  score of the Quality of Life Index has been associated with significant improvement in overall quality of life, self-image, physical symptoms, and general health in studies assessing change in quality of life.  PHQ-9: Recent Review Flowsheet Data    Depression screen [facility] March 28, 2022 06/16/2017 03/18/2017 02/09/2017   Decreased Interest 0 0 0   Down, Depressed, Hopeless 0 0 0   PHQ - 2 Score 0 0 0   Altered sleeping 0 1 -   Tired, decreased energy 0 0 -   Change in appetite 0 0 -   Feeling bad or failure about yourself  0 0 -   Trouble concentrating 0 0 -   Moving slowly or fidgety/restless 0 0 -   Suicidal thoughts 0 0 -   PHQ-9 Score 0 1 -   Difficult doing work/chores - Not difficult at all -     Interpretation of Total Score  Total Score Depression Severity:  1-4 = Minimal depression, 5-9 = Mild depression, 10-14 = Moderate depression, 15-19 = Moderately severe depression, 20-27 = Severe depression   Psychosocial Evaluation and Intervention: Psychosocial Evaluation - 06/17/17 0746      Discharge Psychosocial Assessment & Intervention   Comments  Patient has no psychosocial issues identified at discharge. His exit QOL score improved by 16.13% at 25.63%. His PHQ-9 score  at exit was 0. Initial was 1.       Psychosocial Re-Evaluation: Psychosocial Re-Evaluation    Scraper Name 04/25/17 1514 05/25/17 1251           Psychosocial Re-Evaluation   Current issues with  None Identified  None Identified      Comments  Patient's initial QOL score was 22.56 and his PHQ-9 score was 1. No issues identified.   Patient's initial QOL score was 22.56 and his PHQ-9 score was 1. No issues identified.       Expected Outcomes  Patient will have no psychosocial issues identifed at discharge.   Patient will have no psychosocial issues identifed at discharge.       Interventions  Stress management education;Encouraged to attend Cardiac Rehabilitation for the exercise;Relaxation education  Stress management  education;Encouraged to attend Cardiac Rehabilitation for the exercise;Relaxation education      Continue Psychosocial Services   No Follow up required  No Follow up required         Psychosocial Discharge (Final Psychosocial Re-Evaluation): Psychosocial Re-Evaluation - 05/25/17 1251      Psychosocial Re-Evaluation   Current issues with  None Identified    Comments  Patient's initial QOL score was 22.56 and his PHQ-9 score was 1. No issues identified.     Expected Outcomes  Patient will have no psychosocial issues identifed at discharge.     Interventions  Stress management education;Encouraged to attend Cardiac Rehabilitation for the exercise;Relaxation education    Continue Psychosocial Services   No Follow up required       Vocational Rehabilitation: Provide vocational rehab assistance to qualifying candidates.   Vocational Rehab Evaluation & Intervention: Vocational Rehab - 03/18/17 1433      Initial Vocational Rehab Evaluation & Intervention   Assessment shows need for Vocational Rehabilitation  No       Education: Education Goals: Education classes will be provided on a weekly basis, covering required topics. Participant will state understanding/return demonstration of topics presented.  Learning Barriers/Preferences: Learning Barriers/Preferences - 03/18/17 1433      Learning Barriers/Preferences   Learning Barriers  None    Learning Preferences  Audio;Written Material;Skilled Demonstration       Education Topics: Hypertension, Hypertension Reduction -Define heart disease and high blood pressure. Discus how high blood pressure affects the body and ways to reduce high blood pressure.   CARDIAC REHAB PHASE II EXERCISE from 06/08/2017 in [place]  Date  06/01/17  Educator  Russella Dar  Instruction Review Code  2- Demonstrated Understanding      Exercise and Your Heart -Discuss why it is important to exercise, the FITT principles of  exercise, normal and abnormal responses to exercise, and how to exercise safely.   CARDIAC REHAB PHASE II EXERCISE from 06/08/2017 in [place]  Date  06/08/17  Educator  [place]  Instruction Review Code  2- Demonstrated Understanding      Angina -Discuss definition of angina, causes of angina, treatment of angina, and how to decrease risk of having angina.   Cardiac Medications -Review what the following cardiac medications are used for, how they affect the body, and side effects that may occur when taking the medications.  Medications include Aspirin, Beta blockers, calcium channel blockers, ACE Inhibitors, angiotensin receptor blockers, diuretics, digoxin, and antihyperlipidemics.   CARDIAC REHAB PHASE II EXERCISE from 06/08/2017 in [place]  Date  03/24/17  Educator  DC  Instruction Review Code  2- Demonstrated Understanding  Congestive Heart Failure -Discuss the definition of CHF, how to live with CHF, the signs and symptoms of CHF, and how keep track of weight and sodium intake.   CARDIAC REHAB PHASE II EXERCISE from 06/08/2017 in [place]  Date  03/31/17  Educator  DC  Instruction Review Code  2- Demonstrated Understanding      Heart Disease and Intimacy -Discus the effect sexual activity has on the heart, how changes occur during intimacy as we age, and safety during sexual activity.   CARDIAC REHAB PHASE II EXERCISE from 06/08/2017 in [place]  Date  04/07/17  Educator  DC  Instruction Review Code  2- Demonstrated Understanding      Smoking Cessation / COPD -Discuss different methods to quit smoking, the health benefits of quitting smoking, and the definition of COPD.   CARDIAC REHAB PHASE II EXERCISE from 06/08/2017 in [place]  Date  04/14/17  Educator  DC  Instruction Review Code  2- Demonstrated Understanding      Nutrition I:  Fats -Discuss the types of cholesterol, what cholesterol does to the heart, and how cholesterol levels can be controlled.   CARDIAC REHAB PHASE II EXERCISE from 06/08/2017 in [place]  Date  04/20/17  Educator  DC  Instruction Review Code  2- Demonstrated Understanding      Nutrition II: Labels -Discuss the different components of food labels and how to read food label   CARDIAC REHAB PHASE II EXERCISE from 06/08/2017 in [place]  Date  04/27/17  Educator  DC  Instruction Review Code  2- Demonstrated Understanding      Heart Parts/Heart Disease and PAD -Discuss the anatomy of the heart, the pathway of blood circulation through the heart, and these are affected by heart disease.   CARDIAC REHAB PHASE II EXERCISE from 06/08/2017 in [place]  Date  05/04/17  Educator  DC  Instruction Review Code  2- Demonstrated Understanding      Stress I: Signs and Symptoms -Discuss the causes of stress, how stress may lead to anxiety and depression, and ways to limit stress.   CARDIAC REHAB PHASE II EXERCISE from 06/08/2017 in [place]  Date  05/11/17  Educator  DJ  Instruction Review Code  2- Demonstrated Understanding      Stress II: Relaxation -Discuss different types of relaxation techniques to limit stress.   CARDIAC REHAB PHASE II EXERCISE from 06/08/2017 in [place]  Date  05/18/17  Educator  DC  Instruction Review Code  2- Demonstrated Understanding      Warning Signs of Stroke / TIA -Discuss definition of a stroke, what the signs and symptoms are of a stroke, and how to identify when someone is having stroke.   CARDIAC REHAB PHASE II EXERCISE from 06/08/2017 in [place]  Date  05/25/17  Educator  DC  Instruction Review Code  2- Demonstrated Understanding      Knowledge Questionnaire Score: Knowledge Questionnaire Score -  06/16/17 1643      Knowledge Questionnaire Score   Pre Score  22/24    Post Score  22/24       Core Components/Risk Factors/Patient Goals at Admission: Personal Goals and Risk Factors at Admission - 03/18/17 1434      Core Components/Risk Factors/Patient Goals on Admission    Weight Management  Weight Maintenance    Hypertension  Yes    Intervention  Provide education on lifestyle modifcations including regular physical activity/exercise, weight management, moderate sodium restriction and increased consumption of fresh fruit, vegetables, and low fat dairy, alcohol moderation, and smoking cessation.;Monitor prescription use compliance.    Expected Outcomes  Short Term: Continued assessment and intervention until BP is < 140/39m HG in hypertensive participants. < 130/858mHG in hypertensive participants with diabetes, heart failure or chronic kidney disease.;Long Term: Maintenance of blood pressure at goal levels.    Personal Goal Other  Yes    Personal Goal  Build upper body strength; improve grip strength; no more heart event.     Intervention  Patient will attend CR 3 days/week and suppment with exercise 2 days/week.     Expected Outcomes  Patient will meet his personal goals.        Core Components/Risk Factors/Patient Goals Review:  Goals and Risk Factor Review    Row Name 04/25/17 1507 05/25/17 1249 06/16/17 1646         Core Components/Risk Factors/Patient Goals Review   Personal Goals Review  Weight Management/Obesity;Hypertension Build upper body strength and grip strength; no more heart events.   Weight Management/Obesity;Hypertension Increase upper body strength; grip strength; no more heart events.   Weight Management/Obesity;Hypertension Upper body strength; improve grip strength; no more heart event.      Review  Patient has completed 16 sessions maintaining his weight. He is doing well in the program with progression. He says he is feeling stronger and is able to do more  around the house. He was able to work out in his yard this weekend without difficulty. His b/p is controlled. Will continue to monitor for progress.   Patient has 28 sessions maintaining his weight. He continues to do well in the program with progression. He continues to say he feels stronger and feels he is gaining upper body strength. He is doing more around the house and is able to do all of his activites without difficulty. Will continue to monitor for progress.   Patient graduated with 36 sessions maintaining his weight. He did great in the program. His exit walk test improved by 20%. His exit measurements improved in he Medficts score by 32%, his grip strength improved and his balance improved. He says he is exercising more now and feels the program helped him to strart an exercise program. He says he feels he has accomplished his goals of increased upper body strength. He is back to doing all his ADL's including being his wife's caregiver. He plans to continue exercising at home by walking.      Expected Outcomes  Patient will continue to complete sessions and complete the program meeting his personal goals.   Patient will continue to complete sessions and complete the program meeting his personal goals.   Patient will continue to exercise at home and continue to meet his personal goals. CR will f/u for one year.         Core Components/Risk Factors/Patient Goals at Discharge (Final Review):  Goals and Risk Factor Review - 06/16/17 1646      Core Components/Risk Factors/Patient Goals Review   Personal Goals Review  Weight Management/Obesity;Hypertension Upper body strength; improve grip strength; no more heart event.     Review  Patient graduated with 36 sessions maintaining his weight. He did great in the program. His exit walk test improved by 20%. His exit measurements improved in he Medficts score by 32%, his grip strength improved and his balance improved. He says he is exercising  more now and  feels the program helped him to strart an exercise program. He says he feels he has accomplished his goals of increased upper body strength. He is back to doing all his ADL's including being his wife's caregiver. He plans to continue exercising at home by walking.     Expected Outcomes  Patient will continue to exercise at home and continue to meet his personal goals. CR will f/u for one year.        ITP Comments: ITP Comments    Row Name 03/30/17 1412           ITP Comments  Patient new to program. He has completed 5 sessions. Will continue to monitor for progress.           Comments: Patient graduated from [facility] today on 06/11/2015 after completing 36 sessions. He achieved LTG of 30 minutes of aerobic exercise at Max Met level of 3.7. All patients vitals are WNL. Patient has met with dietician. Discharge instruction has been reviewed in detail and patient stated an understanding of material given. Patient plans to exercise at home by walking. Cardiac Rehab staff will make f/u calls at 1 month, 6 months, and 1 year. Patient had no complaints of any abnormal S/S or pain on their exit visit.

## 2017-06-17 NOTE — Progress Notes (Signed)
Discharge Progress Report  Patient Details  Name: Christopher Booth MRN: [id number] Date of Birth: Jan 19, 1941 Referring Provider:     [facility] from 03/18/2017 in [place]  Referring Provider  [name]       Number of Visits: 36  Reason for Discharge:  Patient reached a stable level of exercise. Patient independent in their exercise. Patient has met program and personal goals.  Smoking History:  Social History   Tobacco Use  Smoking Status Never Smoker  Smokeless Tobacco Never Used    Diagnosis:  S/P CABG x 3  NSTEMI (non-ST elevated myocardial infarction) ([place])  ADL [facility]:   Initial Exercise Prescription: Initial Exercise Prescription - 03/18/17 1400      Date of Initial Exercise RX and Referring Provider   Date  03/18/17    Referring Provider  [name]      Treadmill   MPH  1.8    Grade  0    Minutes  15    METs  2.3      Recumbant Elliptical   Level  1    RPM  49    Watts  62    Minutes  20    METs  3.4      Prescription Details   Frequency (times per week)  3    Duration  Progress to 30 minutes of continuous aerobic without signs/symptoms of physical distress      Intensity   THRR 40-80% of Max Heartrate  109-120-132    Ratings of Perceived Exertion  11-13    Perceived Dyspnea  0-4      Progression   Progression  Continue progressive overload as per policy without signs/symptoms or physical distress.      Resistance Training   Training Prescription  Yes    Weight  1    Reps  10-15       Discharge Exercise Prescription (Final Exercise Prescription Changes): Exercise Prescription Changes - 05/31/17 0700      Response to Exercise   Blood Pressure (Admit)  114/70    Blood Pressure (Exercise)  136/74    Blood Pressure (Exit)  106/70    Heart Rate (Admit)  67 bpm    Heart Rate (Exercise)  119 bpm    Heart Rate (Exit)  84 bpm    Rating of Perceived Exertion (Exercise)  11    Duration   Progress to 30 minutes of  aerobic without signs/symptoms of physical distress    Intensity  THRR unchanged 98-113-129      Progression   Progression  Continue to progress workloads to maintain intensity without signs/symptoms of physical distress.      Resistance Training   Training Prescription  Yes    Weight  5    Reps  10-15      Treadmill   MPH  3.1    Grade  0.5    Minutes  20    METs  3.56      Elliptical   Level  1    Speed  35    Minutes  15    METs  2.7      Home Exercise Plan   Plans to continue exercise at  Home (comment)    Frequency  Add 2 additional days to program exercise sessions.    Initial Home Exercises Provided  03/21/17       Functional Capacity: 6 Minute Walk    Row Name 03/18/17  1408 06/14/17 0816       6 Minute Walk   Phase  Initial  Discharge    Distance  1500 feet  1800 feet    Distance % Change  0 %  20 %    Distance Feet Change  0 ft  300 ft    Walk Time  6 minutes  6 minutes    # of Rest Breaks  0  0    MPH  2.84  3.4    METS  3.17  3.61    RPE  11  11    Perceived Dyspnea   9  9    VO2 Peak  10.71  12.62    Symptoms  No  No    Resting HR  85 bpm  73 bpm    Resting BP  112/86  114/60    Resting Oxygen Saturation   96 %  96 %    Exercise Oxygen Saturation  during 6 min walk  93 %  91 %    Max Ex. HR  99 bpm  99 bpm    Max Ex. BP  134/84  138/75    2 Minute Post BP  120/80  110/66       Psychological, QOL, Others - Outcomes: PHQ Mar 19, 2022: Depression screen [facility] 03/19/22 06/16/2017 03/18/2017 02/09/2017  Decreased Interest 0 0 0  Down, Depressed, Hopeless 0 0 0  PHQ - 2 Score 0 0 0  Altered sleeping 0 1 -  Tired, decreased energy 0 0 -  Change in appetite 0 0 -  Feeling bad or failure about yourself  0 0 -  Trouble concentrating 0 0 -  Moving slowly or fidgety/restless 0 0 -  Suicidal thoughts 0 0 -  PHQ-9 Score 0 1 -  Difficult doing work/chores - Not difficult at all -    Quality of Life: Quality of Life - 06/14/17 0818       Quality of Life Scores   Health/Function Pre  23.37 %    Health/Function Post  27.14 %    Health/Function % Change  16.13 %    Socioeconomic Pre  23 %    Socioeconomic Post  24.29 %    Socioeconomic % Change   5.61 %    Psych/Spiritual Pre  21.79 %    Psych/Spiritual Post  22.5 %    Psych/Spiritual % Change  3.26 %    Family Pre  19.33 %    Family Post  26.63 %    Family % Change  37.77 %    GLOBAL Pre  22.56 %    GLOBAL Post  25.63 %    GLOBAL % Change  13.61 %       Personal Goals: Goals established at orientation with interventions provided to work toward goal. Personal Goals and Risk Factors at Admission - 03/18/17 1434      Core Components/Risk Factors/Patient Goals on Admission    Weight Management  Weight Maintenance    Hypertension  Yes    Intervention  Provide education on lifestyle modifcations including regular physical activity/exercise, weight management, moderate sodium restriction and increased consumption of fresh fruit, vegetables, and low fat dairy, alcohol moderation, and smoking cessation.;Monitor prescription use compliance.    Expected Outcomes  Short Term: Continued assessment and intervention until BP is < 140/47m HG in hypertensive participants. < 130/820mHG in hypertensive participants with diabetes, heart failure or chronic kidney disease.;Long Term: Maintenance of blood pressure at goal levels.  Personal Goal Other  Yes    Personal Goal  Build upper body strength; improve grip strength; no more heart event.     Intervention  Patient will attend CR 3 days/week and suppment with exercise 2 days/week.     Expected Outcomes  Patient will meet his personal goals.         Personal Goals Discharge: Goals and Risk Factor Review    Row Name 04/25/17 1507 05/25/17 1249 06/16/17 1646         Core Components/Risk Factors/Patient Goals Review   Personal Goals Review  Weight Management/Obesity;Hypertension Build upper body strength and grip strength; no  more heart events.   Weight Management/Obesity;Hypertension Increase upper body strength; grip strength; no more heart events.   Weight Management/Obesity;Hypertension Upper body strength; improve grip strength; no more heart event.      Review  Patient has completed 16 sessions maintaining his weight. He is doing well in the program with progression. He says he is feeling stronger and is able to do more around the house. He was able to work out in his yard this weekend without difficulty. His b/p is controlled. Will continue to monitor for progress.   Patient has 28 sessions maintaining his weight. He continues to do well in the program with progression. He continues to say he feels stronger and feels he is gaining upper body strength. He is doing more around the house and is able to do all of his activites without difficulty. Will continue to monitor for progress.   Patient graduated with 36 sessions maintaining his weight. He did great in the program. His exit walk test improved by 20%. His exit measurements improved in he Medficts score by 32%, his grip strength improved and his balance improved. He says he is exercising more now and feels the program helped him to strart an exercise program. He says he feels he has accomplished his goals of increased upper body strength. He is back to doing all his ADL's including being his wife's caregiver. He plans to continue exercising at home by walking.      Expected Outcomes  Patient will continue to complete sessions and complete the program meeting his personal goals.   Patient will continue to complete sessions and complete the program meeting his personal goals.   Patient will continue to exercise at home and continue to meet his personal goals. CR will f/u for one year.         Exercise Goals and Review: Exercise Goals    Row Name 03/18/17 1409             Exercise Goals   Increase Physical Activity  Yes       Intervention  Provide advice, education,  support and counseling about physical activity/exercise needs.;Develop an individualized exercise prescription for aerobic and resistive training based on initial evaluation findings, risk stratification, comorbidities and participant's personal goals.       Expected Outcomes  Short Term: Attend rehab on a regular basis to increase amount of physical activity.       Increase Strength and Stamina  Yes       Intervention  Provide advice, education, support and counseling about physical activity/exercise needs.;Develop an individualized exercise prescription for aerobic and resistive training based on initial evaluation findings, risk stratification, comorbidities and participant's personal goals.       Expected Outcomes  Short Term: Increase workloads from initial exercise prescription for resistance, speed, and METs.  Able to understand and use rate of perceived exertion (RPE) scale  Yes       Intervention  Provide education and explanation on how to use RPE scale       Expected Outcomes  Short Term: Able to use RPE daily in rehab to express subjective intensity level;Long Term:  Able to use RPE to guide intensity level when exercising independently       Able to understand and use Dyspnea scale  Yes       Intervention  Provide education and explanation on how to use Dyspnea scale       Expected Outcomes  Long Term: Able to use Dyspnea scale to guide intensity level when exercising independently;Short Term: Able to use Dyspnea scale daily in rehab to express subjective sense of shortness of breath during exertion       Knowledge and understanding of Target Heart Rate Range (THRR)  Yes       Intervention  Provide education and explanation of THRR including how the numbers were predicted and where they are located for reference       Expected Outcomes  Short Term: Able to state/look up THRR;Long Term: Able to use THRR to govern intensity when exercising independently;Short Term: Able to use daily as  guideline for intensity in rehab       Able to check pulse independently  Yes       Intervention  Provide education and demonstration on how to check pulse in carotid and radial arteries.;Review the importance of being able to check your own pulse for safety during independent exercise       Expected Outcomes  Short Term: Able to explain why pulse checking is important during independent exercise;Long Term: Able to check pulse independently and accurately       Understanding of Exercise Prescription  Yes       Intervention  Provide education, explanation, and written materials on patient's individual exercise prescription       Expected Outcomes  Short Term: Able to explain program exercise prescription;Long Term: Able to explain home exercise prescription to exercise independently          Nutrition & Weight - Outcomes: Pre Biometrics - 03/18/17 1431      Pre Biometrics   Height  '5\' 7"'  (1.702 m)    Weight  161 lb 12.8 oz (73.4 kg)    Waist Circumference  33 inches    Hip Circumference  39 inches    Waist to Hip Ratio  0.85 %    BMI (Calculated)  25.34    Triceps Skinfold  12 mm    % Body Fat  22.8 %    Grip Strength  56.46 kg    Flexibility  0 in    Single Leg Stand  31 seconds      Post Biometrics - 06/14/17 0817       Post  Biometrics   Height  '5\' 7"'  (1.702 m)    Weight  162 lb 5.2 oz (73.6 kg)    Waist Circumference  33 inches    Hip Circumference  38 inches    Waist to Hip Ratio  0.87 %    BMI (Calculated)  25.42    Triceps Skinfold  12 mm    % Body Fat  22.9 %    Grip Strength  58.73 kg    Flexibility  0 in    Single Leg Stand  38 seconds  Nutrition: Nutrition Therapy & Goals - 05/25/17 1248      Nutrition Therapy   RD appointment deferred  Yes      Personal Nutrition Goals   Personal Goal #2  Patient would like to learn more about how to cook healthy meals. He is following a heart healthy diet low in carbs and sugar.     Additional Goals?  No     Comments  Patient continues to follow a heart healthy diet low in carbarhydrates and sugar. Will continue to monitor.       Intervention Plan   Intervention  Nutrition handout(s) given to patient.    Expected Outcomes  Short Term Goal: Understand basic principles of dietary content, such as calories, fat, sodium, cholesterol and nutrients.       Nutrition Discharge: Nutrition Assessments - 06/16/17 1644      MEDFICTS Scores   Pre Score  32    Post Score  0    Score Difference  -32       Education Questionnaire Score: Knowledge Questionnaire Score - 06/16/17 1643      Knowledge Questionnaire Score   Pre Score  22/24    Post Score  22/24       Goals reviewed with patient; copy given to patient.

## 2017-07-05 DIAGNOSIS — R319 Hematuria, unspecified: Secondary | ICD-10-CM | POA: Diagnosis not present

## 2017-12-06 ENCOUNTER — Telehealth: Payer: Self-pay | Admitting: Cardiology

## 2017-12-06 NOTE — Telephone Encounter (Signed)
Patient states he is under a lot of stress, wife is not doing well.He was working outside and had frequent "PAC's or PVC's"  Does not feel like a-fib to him . Now inside the home  he notes this "flipping" sensation only occasionally.He has made a f/u apt 01/10/18

## 2017-12-06 NOTE — Telephone Encounter (Signed)
Pt is having some abnormal beats, he's scheduled to see [name] on 01/10/18  Please give him a call

## 2017-12-07 NOTE — Telephone Encounter (Signed)
Called pt. No answer, no voicemail

## 2017-12-07 NOTE — Telephone Encounter (Signed)
Please have him come by the [facility] [place] office for a nurse visit with ECG.  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  Hopefully, he is just having some ectopy rather than recurrent atrial fibrillation.

## 2017-12-14 NOTE — Telephone Encounter (Signed)
Pt will come tomorrow at 4 pm for nurse apt, EKG

## 2017-12-15 ENCOUNTER — Ambulatory Visit: Payer: PPO

## 2017-12-15 VITALS — HR 79 | Ht 67.0 in | Wt 163.0 lb

## 2017-12-15 DIAGNOSIS — R002 Palpitations: Secondary | ICD-10-CM

## 2017-12-26 DIAGNOSIS — N182 Chronic kidney disease, stage 2 (mild): Secondary | ICD-10-CM | POA: Diagnosis not present

## 2017-12-26 DIAGNOSIS — N62 Hypertrophy of breast: Secondary | ICD-10-CM | POA: Diagnosis not present

## 2017-12-26 DIAGNOSIS — I2510 Atherosclerotic heart disease of native coronary artery without angina pectoris: Secondary | ICD-10-CM | POA: Diagnosis not present

## 2017-12-26 DIAGNOSIS — M1711 Unilateral primary osteoarthritis, right knee: Secondary | ICD-10-CM | POA: Diagnosis not present

## 2017-12-26 DIAGNOSIS — Z0000 Encounter for general adult medical examination without abnormal findings: Secondary | ICD-10-CM | POA: Diagnosis not present

## 2017-12-26 DIAGNOSIS — R319 Hematuria, unspecified: Secondary | ICD-10-CM | POA: Diagnosis not present

## 2017-12-26 DIAGNOSIS — E782 Mixed hyperlipidemia: Secondary | ICD-10-CM | POA: Diagnosis not present

## 2017-12-26 DIAGNOSIS — I4891 Unspecified atrial fibrillation: Secondary | ICD-10-CM | POA: Diagnosis not present

## 2017-12-26 DIAGNOSIS — I129 Hypertensive chronic kidney disease with stage 1 through stage 4 chronic kidney disease, or unspecified chronic kidney disease: Secondary | ICD-10-CM | POA: Diagnosis not present

## 2017-12-26 DIAGNOSIS — E2609 Other primary hyperaldosteronism: Secondary | ICD-10-CM | POA: Diagnosis not present

## 2018-01-09 NOTE — Progress Notes (Signed)
Cardiology Office Note  Date: 01/10/2018   ID: Christopher Booth, DOB August 09, 1940, MRN [id number]  PCP: Sinda Du, MD  Primary Cardiologist: Rozann Lesches, MD   Chief Complaint  Patient presents with  . Coronary Artery Disease    History of Present Illness: Christopher Booth is a 77 y.o. male last seen in April.  He presents for a routine visit.  He does not report any angina symptoms.  Has had some PVCs/palpitations in the last few months, although under a lot of stress caring for his wife with declining health.  He is starting to feel better as she has improved.  He continues to walk at least a mile a day, like to try to get 3 miles and if he can.  He reports NYHA class II dyspnea, no palpitations or syncope.  Reviewed his medications which are outlined below.  He reports compliance and no obvious intolerances.  He will be having a physical with lab work soon with Dr. Luan Pulling.  I reviewed his recent ECG in November.  Past Medical History:  Diagnosis Date  . Arthritis   . 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 Essential hypertension   . Gynecomastia   . Hyperaldosteronism   . Postoperative atrial fibrillation ([place])   . Spinal stenosis   . Temporomandibular joint disease   . Wears glasses     Past Surgical History:  Procedure Laterality Date  . CATARACT EXTRACTION W/PHACO Right 01/28/2015   Procedure: CATARACT EXTRACTION PHACO AND INTRAOCULAR LENS PLACEMENT RIGHT EYE;  Surgeon: Williams Che, MD;  Location: [facility];  Service: Ophthalmology;  Laterality: Right;  CDE:27.23  . CATARACT EXTRACTION W/PHACO Left 04/14/2015   Procedure: CATARACT EXTRACTION PHACO AND INTRAOCULAR LENS PLACEMENT LEFT EYE CDE=15.86;  Surgeon: Williams Che, MD;  Location: [facility];  Service: Ophthalmology;  Laterality: Left;  . CERVICAL FUSION  1993  . CHOLECYSTECTOMY  1995  . COLONOSCOPY    . CORONARY ARTERY BYPASS GRAFT N/A 01/12/2017   Procedure:  CORONARY ARTERY BYPASS GRAFTING (CABG);  Surgeon: Grace Isaac, MD;  Location: [place];  Service: Open Heart Surgery;  Laterality: N/A;  Times 3 using left internal mammary artery to LAD and endoscopically harvested right thigh saphenous vein to OM and PD  . KNEE ARTHROSCOPY Right 10/09/2013   Procedure: RIGHT ARTHROSCOPY KNEE Partial medial and lateral meniscal tear and chondroplasty;  Surgeon: Hessie Dibble, MD;  Location: [place];  Service: Orthopedics;  Laterality: Right;  Partial medial and lateral meniscal tear and chondroplasty   . LEFT HEART CATH AND CORONARY ANGIOGRAPHY N/A 01/11/2017   Procedure: LEFT HEART CATH AND CORONARY ANGIOGRAPHY;  Surgeon: Jettie Booze, MD;  Location: [place] [facility];  Service: Cardiovascular;  Laterality: N/A;  . TEE WITHOUT CARDIOVERSION N/A 01/12/2017   Procedure: TRANSESOPHAGEAL ECHOCARDIOGRAM (TEE);  Surgeon: Grace Isaac, MD;  Location: [place];  Service: Open Heart Surgery;  Laterality: N/A;  . TONSILLECTOMY      Current Outpatient Medications  Medication Sig Dispense Refill  . acetaminophen (TYLENOL) 500 MG tablet Take 500 mg by mouth every 6 (six) hours as needed for mild pain.     Marland Kitchen aspirin EC 81 MG EC tablet Take 1 tablet (81 mg total) by mouth daily.    Marland Kitchen atorvastatin (LIPITOR) 20 MG tablet Take 20 mg by mouth daily.    . hydrochlorothiazide (HYDRODIURIL) 25 MG tablet Take 25 mg by mouth  daily.    . metoprolol tartrate (LOPRESSOR) 25 MG tablet TAKE (1) TABLET BY MOUTH TWICE DAILY. 180 tablet 3  . Multiple Vitamins-Minerals (MULTI COMPLETE PO) Take by mouth daily.    Marland Kitchen eplerenone (INSPRA) 50 MG tablet Take 50 mg by mouth 2 (two) times daily.      No current facility-administered medications for this visit.    Allergies:  Diovan [valsartan]   Social History: The patient  reports that he has never smoked. He has never used smokeless tobacco. He reports current alcohol use. He reports that he does not use  drugs.   ROS:  Please see the history of present illness. Otherwise, complete review of systems is positive for none.  All other systems are reviewed and negative.   Physical Exam: VS:  BP 140/78 (BP Location: Right Arm)   Pulse 62   Ht 5\' 7"  (1.702 m)   Wt 165 lb (74.8 kg)   SpO2 98%   BMI 25.84 kg/m , BMI Body mass index is 25.84 kg/m.  Wt Readings from Last 3 Encounters:  01/10/18 165 lb (74.8 kg)  12/15/17 163 lb (73.9 kg)  06/14/17 162 lb 5.2 oz (73.6 kg)    General: Patient appears comfortable at rest. HEENT: Conjunctiva and lids normal, oropharynx clear. Neck: Supple, no elevated JVP or carotid bruits, no thyromegaly. Lungs: Clear to auscultation, nonlabored breathing at rest. Thorax: Well-healed sternal incision. Cardiac: Regular rate and rhythm, no S3 or significant systolic murmur. Abdomen: Soft, nontender, bowel sounds present. Extremities: No pitting edema, distal pulses 2+. Skin: Warm and dry. Musculoskeletal: No kyphosis. Neuropsychiatric: Alert and oriented x3, affect grossly appropriate.  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  Recent Labwork: 01/13/2017: Magnesium 2.3; TSH 0.493 01/16/2017: BUN 15; Creatinine, Ser 0.91; Hemoglobin 11.7; Platelets 293; Potassium 3.9; Sodium 134     Component Value Date/Time   CHOL 137 01/12/2017 0148   TRIG 84 01/12/2017 0148   HDL 39 (L) 01/12/2017 0148   CHOLHDL 3.5 01/12/2017 0148   VLDL 17 01/12/2017 0148   LDLCALC 81 01/12/2017 0148    Other Studies Reviewed Today:  Echocardiogram 01/11/2017: Study Conclusions  - Left ventricle: The cavity size was normal. Wall thickness was increased in a pattern of mild LVH. Systolic function was normal. The estimated ejection fraction was in the range of 60% to 65%. Wall motion was normal; there were no regional wall motion abnormalities. Doppler parameters are consistent with  abnormal left ventricular relaxation (grade 1 diastolic dysfunction). The E/e&' ratio is between 8-15, suggesting indeterminate LV filling pressure. - Aortic valve: Sclerosis without stenosis. There was trivial regurgitation. - Aorta: Aortic root dimension: 41 mm (ED). - Aortic root: The aortic root is mildly dilated. - Mitral valve: Calcified annulus. Mildly thickened leaflets . There was trivial regurgitation. - Left atrium: The atrium was normal in size. - Inferior vena cava: The vessel was normal in size. The respirophasic diameter changes were in the normal range (= 50%), consistent with normal central venous pressure.  Urgent and Critical Findings: A non-critical finding, normal LV function, was reported to [name]. Impressions:  - LVEF 60-65%, mild LVH, normal wall motion, grade 1 DD, indeterminate LV filling pressure, aortic sclerosis with trivial AI, mildly dilated aortic root to 4.1 cm, aortic valve sclerosis, trace central AI, trivial MR, normal LA size, normal IVC  Cardiac catheterization 01/11/2017:  Ost Cx to Prox Cx lesion is 90% stenosed.  Mid  LM to Dist LM lesion is 50% stenosed.  Prox to mid LAD lesion is 70% stenosed. Eccentric, best seen in the RAO caudal view.  Ost LAD to Prox LAD lesion is 40% stenosed.  Ost RCA to Prox RCA lesion is 80% stenosed.  Dist RCA lesion is 80% stenosed.  The left ventricular ejection fraction is 45-50% by visual estimate.  There is mild left ventricular systolic dysfunction.  LV end diastolic pressure is normal.  There is no aortic valve stenosis.  Multivessel disease with culprit lesion being heavily calcified distal left main lesion extending into the circumflex ostium.  Assessment and Plan:  1.  Multivessel CAD status post CABG in December 2018.  He is doing well without angina symptoms on medical therapy.  I encouraged continued walking for exercise.  2.  Hypertension and  hyperaldosteronism.  Still following with Dr. Jimmy Footman on eplerenone.  3.  Mixed hyperlipidemia, tolerating Lipitor.  He is due for follow-up lipids with Dr. Luan Pulling.  4.  Intermittent PVCs, these have settled down with less stress.  He reports no syncope.  Current medicines were reviewed with the patient today.  Disposition: Follow-up in 6 months.  Signed, Satira Sark, MD, [facility] 01/10/2018 9:21 AM    [facility] [place]. [address], [place], [place] [address] Phone: [phone]; Fax: [phone]

## 2018-01-10 ENCOUNTER — Encounter: Payer: Self-pay | Admitting: Cardiology

## 2018-01-10 ENCOUNTER — Ambulatory Visit: Payer: PPO | Admitting: Cardiology

## 2018-01-10 VITALS — BP 140/78 | HR 62 | Ht 67.0 in | Wt 165.0 lb

## 2018-01-10 DIAGNOSIS — E269 Hyperaldosteronism, unspecified: Secondary | ICD-10-CM | POA: Diagnosis not present

## 2018-01-10 DIAGNOSIS — I25119 Atherosclerotic heart disease of native coronary artery with unspecified angina pectoris: Secondary | ICD-10-CM | POA: Diagnosis not present

## 2018-01-10 DIAGNOSIS — E782 Mixed hyperlipidemia: Secondary | ICD-10-CM

## 2018-01-10 DIAGNOSIS — I493 Ventricular premature depolarization: Secondary | ICD-10-CM

## 2018-01-10 NOTE — Patient Instructions (Signed)
Medication Instructions:  Your physician recommends that you continue on your current medications as directed. Please refer to the Current Medication list given to you today.  If you need a refill on your cardiac medications before your next appointment, please call your pharmacy.   Lab work: None If you have labs (blood work) drawn today and your tests are completely normal, you will receive your results only by: Marland Kitchen MyChart Message (if you have MyChart) OR . A paper copy in the mail If you have any lab test that is abnormal or we need to change your treatment, we will call you to review the results.  Testing/Procedures: None  Follow-Up: At [facility], you and your health needs are our priority.  As part of our continuing mission to provide you with exceptional heart care, we have created designated Provider Care Teams.  These Care Teams include your primary Cardiologist (physician) and Advanced Practice Providers (APPs -  Physician Assistants and Nurse Practitioners) who all work together to provide you with the care you need, when you need it. You will need a follow up appointment in 6 months.  Please call our office 2 months in advance to schedule this appointment.  You may see Rozann Lesches, MD or one of the following Advanced Practice Providers on your designated Care Team:   Bernerd Pho, PA-C [facility]) . Ermalinda Barrios, PA-C ([place])  Any Other Special Instructions Will Be Listed Below (If Applicable). None

## 2018-01-26 DIAGNOSIS — I2510 Atherosclerotic heart disease of native coronary artery without angina pectoris: Secondary | ICD-10-CM | POA: Diagnosis not present

## 2018-01-26 DIAGNOSIS — I10 Essential (primary) hypertension: Secondary | ICD-10-CM | POA: Diagnosis not present

## 2018-01-26 DIAGNOSIS — R739 Hyperglycemia, unspecified: Secondary | ICD-10-CM | POA: Diagnosis not present

## 2018-01-26 DIAGNOSIS — E2609 Other primary hyperaldosteronism: Secondary | ICD-10-CM | POA: Diagnosis not present

## 2018-01-30 DIAGNOSIS — I2510 Atherosclerotic heart disease of native coronary artery without angina pectoris: Secondary | ICD-10-CM | POA: Diagnosis not present

## 2018-01-30 DIAGNOSIS — I10 Essential (primary) hypertension: Secondary | ICD-10-CM | POA: Diagnosis not present

## 2018-01-30 DIAGNOSIS — R739 Hyperglycemia, unspecified: Secondary | ICD-10-CM | POA: Diagnosis not present

## 2018-01-30 DIAGNOSIS — E2609 Other primary hyperaldosteronism: Secondary | ICD-10-CM | POA: Diagnosis not present

## 2018-01-31 LAB — HEMOGLOBIN A1C: Hgb A1c MFr Bld: 6.5 — AB (ref 4.0–6.0)

## 2018-03-27 ENCOUNTER — Other Ambulatory Visit: Payer: Self-pay | Admitting: Cardiology

## 2018-04-26 DIAGNOSIS — N183 Chronic kidney disease, stage 3 unspecified: Secondary | ICD-10-CM | POA: Diagnosis not present

## 2018-04-26 DIAGNOSIS — M25552 Pain in left hip: Secondary | ICD-10-CM | POA: Diagnosis not present

## 2018-04-26 DIAGNOSIS — D0472 Carcinoma in situ of skin of left lower limb, including hip: Secondary | ICD-10-CM | POA: Diagnosis not present

## 2018-04-26 DIAGNOSIS — I4891 Unspecified atrial fibrillation: Secondary | ICD-10-CM | POA: Diagnosis not present

## 2018-04-26 DIAGNOSIS — M545 Low back pain: Secondary | ICD-10-CM | POA: Diagnosis not present

## 2018-04-26 DIAGNOSIS — M9903 Segmental and somatic dysfunction of lumbar region: Secondary | ICD-10-CM | POA: Diagnosis not present

## 2018-04-26 DIAGNOSIS — I129 Hypertensive chronic kidney disease with stage 1 through stage 4 chronic kidney disease, or unspecified chronic kidney disease: Secondary | ICD-10-CM | POA: Diagnosis not present

## 2018-04-26 DIAGNOSIS — M5136 Other intervertebral disc degeneration, lumbar region: Secondary | ICD-10-CM | POA: Diagnosis not present

## 2018-04-26 DIAGNOSIS — N182 Chronic kidney disease, stage 2 (mild): Secondary | ICD-10-CM | POA: Diagnosis not present

## 2018-04-26 DIAGNOSIS — M47816 Spondylosis without myelopathy or radiculopathy, lumbar region: Secondary | ICD-10-CM | POA: Diagnosis not present

## 2018-04-26 DIAGNOSIS — G4730 Sleep apnea, unspecified: Secondary | ICD-10-CM | POA: Diagnosis not present

## 2018-04-26 DIAGNOSIS — E2609 Other primary hyperaldosteronism: Secondary | ICD-10-CM | POA: Diagnosis not present

## 2018-04-26 DIAGNOSIS — I2510 Atherosclerotic heart disease of native coronary artery without angina pectoris: Secondary | ICD-10-CM | POA: Diagnosis not present

## 2018-04-26 DIAGNOSIS — Z1159 Encounter for screening for other viral diseases: Secondary | ICD-10-CM | POA: Diagnosis not present

## 2018-04-26 DIAGNOSIS — E782 Mixed hyperlipidemia: Secondary | ICD-10-CM | POA: Diagnosis not present

## 2018-04-26 DIAGNOSIS — I10 Essential (primary) hypertension: Secondary | ICD-10-CM | POA: Diagnosis not present

## 2018-04-26 DIAGNOSIS — N390 Urinary tract infection, site not specified: Secondary | ICD-10-CM | POA: Diagnosis not present

## 2018-04-26 DIAGNOSIS — G459 Transient cerebral ischemic attack, unspecified: Secondary | ICD-10-CM | POA: Diagnosis not present

## 2018-04-26 DIAGNOSIS — Z0000 Encounter for general adult medical examination without abnormal findings: Secondary | ICD-10-CM | POA: Diagnosis not present

## 2018-04-26 DIAGNOSIS — M79661 Pain in right lower leg: Secondary | ICD-10-CM | POA: Diagnosis not present

## 2018-04-26 DIAGNOSIS — R319 Hematuria, unspecified: Secondary | ICD-10-CM | POA: Diagnosis not present

## 2018-04-26 DIAGNOSIS — E1165 Type 2 diabetes mellitus with hyperglycemia: Secondary | ICD-10-CM | POA: Diagnosis not present

## 2018-05-01 DIAGNOSIS — I2510 Atherosclerotic heart disease of native coronary artery without angina pectoris: Secondary | ICD-10-CM | POA: Diagnosis not present

## 2018-05-01 DIAGNOSIS — E785 Hyperlipidemia, unspecified: Secondary | ICD-10-CM | POA: Diagnosis not present

## 2018-05-01 DIAGNOSIS — I10 Essential (primary) hypertension: Secondary | ICD-10-CM | POA: Diagnosis not present

## 2018-05-01 DIAGNOSIS — E119 Type 2 diabetes mellitus without complications: Secondary | ICD-10-CM | POA: Diagnosis not present

## 2018-05-01 DIAGNOSIS — R05 Cough: Secondary | ICD-10-CM | POA: Diagnosis not present

## 2018-08-24 ENCOUNTER — Other Ambulatory Visit: Payer: Self-pay

## 2018-10-24 DIAGNOSIS — M1711 Unilateral primary osteoarthritis, right knee: Secondary | ICD-10-CM | POA: Diagnosis not present

## 2018-10-24 DIAGNOSIS — N182 Chronic kidney disease, stage 2 (mild): Secondary | ICD-10-CM | POA: Diagnosis not present

## 2018-10-24 DIAGNOSIS — N62 Hypertrophy of breast: Secondary | ICD-10-CM | POA: Diagnosis not present

## 2018-10-24 DIAGNOSIS — I4891 Unspecified atrial fibrillation: Secondary | ICD-10-CM | POA: Diagnosis not present

## 2018-10-24 DIAGNOSIS — E2609 Other primary hyperaldosteronism: Secondary | ICD-10-CM | POA: Diagnosis not present

## 2018-10-24 DIAGNOSIS — E782 Mixed hyperlipidemia: Secondary | ICD-10-CM | POA: Diagnosis not present

## 2018-10-24 DIAGNOSIS — I129 Hypertensive chronic kidney disease with stage 1 through stage 4 chronic kidney disease, or unspecified chronic kidney disease: Secondary | ICD-10-CM | POA: Diagnosis not present

## 2018-10-24 DIAGNOSIS — Z0000 Encounter for general adult medical examination without abnormal findings: Secondary | ICD-10-CM | POA: Diagnosis not present

## 2018-10-24 DIAGNOSIS — R319 Hematuria, unspecified: Secondary | ICD-10-CM | POA: Diagnosis not present

## 2018-10-24 DIAGNOSIS — I2510 Atherosclerotic heart disease of native coronary artery without angina pectoris: Secondary | ICD-10-CM | POA: Diagnosis not present

## 2018-11-20 LAB — TSH: TSH: 1.5

## 2018-11-21 ENCOUNTER — Other Ambulatory Visit: Payer: Self-pay | Admitting: Cardiology

## 2018-11-23 DIAGNOSIS — N182 Chronic kidney disease, stage 2 (mild): Secondary | ICD-10-CM | POA: Diagnosis not present

## 2018-12-25 ENCOUNTER — Encounter: Payer: Self-pay | Admitting: Cardiology

## 2018-12-25 DIAGNOSIS — N182 Chronic kidney disease, stage 2 (mild): Secondary | ICD-10-CM | POA: Diagnosis not present

## 2018-12-25 NOTE — Progress Notes (Signed)
Virtual Visit via Telephone Note   This visit type was conducted due to national recommendations for restrictions regarding the COVID-19 Pandemic (e.g. social distancing) in an effort to limit this patient's exposure and mitigate transmission in our community.  Due to his co-morbid illnesses, this patient is at least at moderate risk for complications without adequate follow up.  This format is felt to be most appropriate for this patient at this time.  The patient did not have access to video technology/had technical difficulties with video requiring transitioning to audio format only (telephone).  All issues noted in this document were discussed and addressed.  No physical exam could be performed with this format.  Please refer to the patient's chart for his  consent to telehealth for [name].   Date:  12/26/2018   ID:  Christopher Booth, DOB [birth year], MRN [id number]  Patient Location: Home Provider Location: Office  PCP:  Sinda Du, MD  Cardiologist:  Rozann Lesches, MD Electrophysiologist:  None   Evaluation Performed:  Follow-Up Visit  Chief Complaint:  Cardiac follow-up  History of Present Illness:    Christopher Booth is a 78 y.o. male last seen in December 2019.  We spoke by phone today.  He tells me that he and his wife have been staying around the house to large degree during the pandemic, although he does get out to walk on a regular basis for exercise.  Otherwise, he states that he has been good about social distancing and wearing a mask.  He has been under a lot of stress however.  I reviewed home blood pressure and heart rate checks.  Overall blood pressure control has not been optimal and he does have spikes in pressure despite consistent use of his medications.  He remains on top dose of eplerenone along with HCTZ and was recently on a potassium supplement per Dr. Jimmy Footman.  He also remains on Lopressor.  He has been dieting and has been able to get his weight  down into the 150s.  Hemoglobin A1c dropped to 6%.  Today we discussed considering a switch from Lopressor to Coreg for alpha and beta blocker properties.  He did not tolerate ACE inhibitor or ARB previously.  Norvasc would be another possible option if needed.  The patient does not have symptoms concerning for COVID-19 infection (fever, chills, cough, or new shortness of breath).    Past Medical History:  Diagnosis Date  . Arthritis   . 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 Essential hypertension   . Gynecomastia   . Hyperaldosteronism   . Postoperative atrial fibrillation ([place])   . Spinal stenosis   . Temporomandibular joint disease   . Wears glasses    Past Surgical History:  Procedure Laterality Date  . CATARACT EXTRACTION W/PHACO Right 01/28/2015   Procedure: CATARACT EXTRACTION PHACO AND INTRAOCULAR LENS PLACEMENT RIGHT EYE;  Surgeon: Williams Che, MD;  [facility];  Service: Ophthalmology;  Laterality: Right;  CDE:27.23  . CATARACT EXTRACTION W/PHACO Left 04/14/2015   Procedure: CATARACT EXTRACTION PHACO AND INTRAOCULAR LENS PLACEMENT LEFT EYE CDE=15.86;  Surgeon: Williams Che, MD;  [facility];  Service: Ophthalmology;  Laterality: Left;  . CERVICAL FUSION  1993  . CHOLECYSTECTOMY  1995  . COLONOSCOPY    . CORONARY ARTERY BYPASS GRAFT N/A 01/12/2017   Procedure: CORONARY ARTERY BYPASS GRAFTING (CABG);  Surgeon: Grace Isaac, MD;  Location: [place];  Service: Open  Heart Surgery;  Laterality: N/A;  Times 3 using left internal mammary artery to LAD and endoscopically harvested right thigh saphenous vein to OM and PD  . KNEE ARTHROSCOPY Right 10/09/2013   Procedure: RIGHT ARTHROSCOPY KNEE Partial medial and lateral meniscal tear and chondroplasty;  Surgeon: Hessie Dibble, MD;  Location: [place];  Service: Orthopedics;  Laterality: Right;  Partial medial and lateral meniscal tear and chondroplasty    . LEFT HEART CATH AND CORONARY ANGIOGRAPHY N/A 01/11/2017   Procedure: LEFT HEART CATH AND CORONARY ANGIOGRAPHY;  Surgeon: Jettie Booze, MD;  Location: [place] [facility];  Service: Cardiovascular;  Laterality: N/A;  . TEE WITHOUT CARDIOVERSION N/A 01/12/2017   Procedure: TRANSESOPHAGEAL ECHOCARDIOGRAM (TEE);  Surgeon: Grace Isaac, MD;  Location: [place];  Service: Open Heart Surgery;  Laterality: N/A;  . TONSILLECTOMY       Current Meds  Medication Sig  . acetaminophen (TYLENOL) 500 MG tablet Take 500 mg by mouth every 6 (six) hours as needed for mild pain.   Marland Kitchen aspirin EC 81 MG EC tablet Take 1 tablet (81 mg total) by mouth daily.  Marland Kitchen atorvastatin (LIPITOR) 20 MG tablet Take 20 mg by mouth daily.  Marland Kitchen eplerenone (INSPRA) 50 MG tablet Take 50 mg by mouth 2 (two) times daily.   . hydrochlorothiazide (HYDRODIURIL) 25 MG tablet Take 25 mg by mouth daily.  . Multiple Vitamins-Minerals (MULTI COMPLETE PO) Take by mouth daily.  . [DISCONTINUED] metoprolol tartrate (LOPRESSOR) 25 MG tablet TAKE (1) TABLET BY MOUTH TWICE DAILY.     Allergies:   Diovan [valsartan]   Social History   Tobacco Use  . Smoking status: Never Smoker  . Smokeless tobacco: Never Used  Substance Use Topics  . Alcohol use: Yes    Comment: Occasional  . Drug use: No     Family Hx: The patient's family history includes Cancer in his brother and brother; Hypertension in an other family member.  ROS:   Please see the history of present illness. All other systems reviewed and are negative.   Prior CV studies:   The following studies were reviewed today:  Echocardiogram 01/11/2017: Study Conclusions  - Left ventricle: The cavity size was normal. Wall thickness was increased in a pattern of mild LVH. Systolic function was normal. The estimated ejection fraction was in the range of 60% to 65%. Wall motion was normal; there were no regional wall motion abnormalities. Doppler parameters are  consistent with abnormal left ventricular relaxation (grade 1 diastolic dysfunction). The E/e&' ratio is between 8-15, suggesting indeterminate LV filling pressure. - Aortic valve: Sclerosis without stenosis. There was trivial regurgitation. - Aorta: Aortic root dimension: 41 mm (ED). - Aortic root: The aortic root is mildly dilated. - Mitral valve: Calcified annulus. Mildly thickened leaflets . There was trivial regurgitation. - Left atrium: The atrium was normal in size. - Inferior vena cava: The vessel was normal in size. The respirophasic diameter changes were in the normal range (= 50%), consistent with normal central venous pressure.  Urgent and Critical Findings: A non-critical finding, normal LV function, was reported to [name]. Impressions:  - LVEF 60-65%, mild LVH, normal wall motion, grade 1 DD, indeterminate LV filling pressure, aortic sclerosis with trivial AI, mildly dilated aortic root to 4.1 cm, aortic valve sclerosis, trace central AI, trivial MR, normal LA size, normal IVC  Cardiac catheterization 01/11/2017:  Ost Cx to Prox Cx lesion is 90% stenosed.  Mid LM to Dist LM lesion is  50% stenosed.  Prox to mid LAD lesion is 70% stenosed. Eccentric, best seen in the RAO caudal view.  Ost LAD to Prox LAD lesion is 40% stenosed.  Ost RCA to Prox RCA lesion is 80% stenosed.  Dist RCA lesion is 80% stenosed.  The left ventricular ejection fraction is 45-50% by visual estimate.  There is mild left ventricular systolic dysfunction.  LV end diastolic pressure is normal.  There is no aortic valve stenosis.  Multivessel disease with culprit lesion being heavily calcified distal left main lesion extending into the circumflex ostium.  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  Recent Labs:  Lab  Results  Component Value Date/Time   CHOL 137 01/12/2017 01:48 AM   TRIG 84 01/12/2017 01:48 AM   HDL 39 (L) 01/12/2017 01:48 AM   CHOLHDL 3.5 01/12/2017 01:48 AM   LDLCALC 81 01/12/2017 01:48 AM    Wt Readings from Last 3 Encounters:  12/26/18 157 lb (71.2 kg)  01/10/18 165 lb (74.8 kg)  12/15/17 163 lb (73.9 kg)     Objective:    Vital Signs:  Ht 5\' 7"  (1.702 m)   Wt 157 lb (71.2 kg)   BMI 24.59 kg/m    Recent blood pressure ranging from 128/81 to 161/88. Patient spoke in full sentences, not short of breath. No audible wheezing or coughing. Speech pattern normal.  ASSESSMENT & PLAN:    1.  Multivessel CAD status post CABG in December 2018.  He is walking for exercise and reports no angina symptoms, stable NYHA class I-II dyspnea.  He continues on aspirin and statin, also beta-blocker.  Continue with observation.  2.  Hypertension in the setting of hyperaldosteronism.  He continues to follow with Dr. Jimmy Footman and is maintaining eplerenone and HCTZ.  Blood pressure control is not optimal.  We will switch from Lopressor to Coreg beginning at 6.25 mg twice daily and titrate as tolerated.  He did not tolerate ACE inhibitor or ARB previously, Norvasc might be a consideration if needed.  He is already doing a good job with diet and exercise, his weight is down compared to last visit.  3.  Mixed hyperlipidemia, continues on Lipitor with follow-up per Dr. Luan Pulling.  COVID-19 Education: The signs and symptoms of COVID-19 were discussed with the patient and how to seek care for testing (follow up with PCP or arrange E-visit).  The importance of social distancing was discussed today.  Time:   Today, I have spent 15 minutes with the patient with telehealth technology discussing the above problems.     Medication Adjustments/Labs and Tests Ordered: Current medicines are reviewed at length with the patient today.  Concerns regarding medicines are outlined above.   Tests Ordered: No  orders of the defined types were placed in this encounter.   Medication Changes: Meds ordered this encounter  Medications  . carvedilol (COREG) 6.25 MG tablet    Sig: Take 1 tablet (6.25 mg total) by mouth 2 (two) times daily.    Dispense:  180 tablet    Refill:  3    12/26/18 lopressor stopped    Follow Up:  Either In Person or Virtual 6 months in the [place] office.  Signed, Rozann Lesches, MD  12/26/2018 8:45 AM    [place]

## 2018-12-26 ENCOUNTER — Encounter: Payer: Self-pay | Admitting: Cardiology

## 2018-12-26 ENCOUNTER — Telehealth (INDEPENDENT_AMBULATORY_CARE_PROVIDER_SITE_OTHER): Payer: PPO | Admitting: Cardiology

## 2018-12-26 VITALS — Ht 67.0 in | Wt 157.0 lb

## 2018-12-26 DIAGNOSIS — E269 Hyperaldosteronism, unspecified: Secondary | ICD-10-CM

## 2018-12-26 DIAGNOSIS — E782 Mixed hyperlipidemia: Secondary | ICD-10-CM

## 2018-12-26 DIAGNOSIS — I25119 Atherosclerotic heart disease of native coronary artery with unspecified angina pectoris: Secondary | ICD-10-CM

## 2018-12-26 DIAGNOSIS — I493 Ventricular premature depolarization: Secondary | ICD-10-CM

## 2018-12-26 MED ORDER — CARVEDILOL 6.25 MG PO TABS: 6.2500 mg | ORAL_TABLET | Freq: Two times a day (BID) | ORAL | 3 refills | Status: DC

## 2018-12-26 NOTE — Patient Instructions (Signed)
Medication Instructions:  STOP Lopressor   START Coreg 6.25 mg twice a day   *If you need a refill on your cardiac medications before your next appointment, please call your pharmacy*  Lab Work: None today If you have labs (blood work) drawn today and your tests are completely normal, you will receive your results only by: Marland Kitchen MyChart Message (if you have MyChart) OR . A paper copy in the mail If you have any lab test that is abnormal or we need to change your treatment, we will call you to review the results.  Testing/Procedures: None today  Follow-Up: At [facility], you and your health needs are our priority.  As part of our continuing mission to provide you with exceptional heart care, we have created designated Provider Care Teams.  These Care Teams include your primary Cardiologist (physician) and Advanced Practice Providers (APPs -  Physician Assistants and Nurse Practitioners) who all work together to provide you with the care you need, when you need it.  Your next appointment:   6 month(s)  The format for your next appointment:   In Person  Provider:   Rozann Lesches, MD  Other Instructions None

## 2019-01-15 DIAGNOSIS — E876 Hypokalemia: Secondary | ICD-10-CM | POA: Diagnosis not present

## 2019-01-15 DIAGNOSIS — N182 Chronic kidney disease, stage 2 (mild): Secondary | ICD-10-CM | POA: Diagnosis not present

## 2019-02-07 DIAGNOSIS — I129 Hypertensive chronic kidney disease with stage 1 through stage 4 chronic kidney disease, or unspecified chronic kidney disease: Secondary | ICD-10-CM | POA: Diagnosis not present

## 2019-02-08 LAB — COMPREHENSIVE METABOLIC PANEL
Calcium: 10.3 (ref 8.7–10.7)
GFR calc Af Amer: 74
GFR calc non Af Amer: 64

## 2019-02-08 LAB — BASIC METABOLIC PANEL
BUN: 20 (ref 4–21)
CO2: 27 — AB (ref 13–22)
Chloride: 102 (ref 99–108)
Creatinine: 1.1 (ref 0.6–1.3)
Glucose: 110
Potassium: 4 (ref 3.4–5.3)
Sodium: 146 (ref 137–147)

## 2019-02-26 DIAGNOSIS — I129 Hypertensive chronic kidney disease with stage 1 through stage 4 chronic kidney disease, or unspecified chronic kidney disease: Secondary | ICD-10-CM | POA: Diagnosis not present

## 2019-02-27 LAB — BASIC METABOLIC PANEL
BUN: 22 — AB (ref 4–21)
CO2: 29 — AB (ref 13–22)
Chloride: 104 (ref 99–108)
Creatinine: 0.9 (ref 0.6–1.3)
Glucose: 122
Potassium: 4.3 (ref 3.4–5.3)
Sodium: 145 (ref 137–147)

## 2019-02-27 LAB — COMPREHENSIVE METABOLIC PANEL
Albumin: 4.6 (ref 3.5–5.0)
Calcium: 9.8 (ref 8.7–10.7)
GFR calc Af Amer: 92
GFR calc non Af Amer: 79

## 2019-05-28 ENCOUNTER — Encounter: Payer: Self-pay | Admitting: Family Medicine

## 2019-05-28 ENCOUNTER — Ambulatory Visit (INDEPENDENT_AMBULATORY_CARE_PROVIDER_SITE_OTHER): Payer: PPO | Admitting: Family Medicine

## 2019-05-28 ENCOUNTER — Other Ambulatory Visit: Payer: Self-pay

## 2019-05-28 VITALS — HR 67 | Temp 97.9°F | Ht 66.5 in | Wt 159.0 lb

## 2019-05-28 DIAGNOSIS — E782 Mixed hyperlipidemia: Secondary | ICD-10-CM | POA: Diagnosis not present

## 2019-05-28 DIAGNOSIS — I25119 Atherosclerotic heart disease of native coronary artery with unspecified angina pectoris: Secondary | ICD-10-CM

## 2019-05-28 DIAGNOSIS — R7309 Other abnormal glucose: Secondary | ICD-10-CM | POA: Diagnosis not present

## 2019-05-28 DIAGNOSIS — I10 Essential (primary) hypertension: Secondary | ICD-10-CM | POA: Diagnosis not present

## 2019-05-28 NOTE — Progress Notes (Signed)
New Patient Office Visit  Subjective:  Patient ID: Christopher Booth, male    DOB: [birth year]  Age: 79 y.o. MRN: [id number]  CC:  Chief Complaint  Patient presents with  . New Patient (Initial Visit)  HTN/Hyperlipidemia  HPI Christopher Booth presents for Hyperlipidemia/CAD with stents, HTN-sees cardio-afib Primary hyperaldosteronemia-takes Inspra HTN-Coreg-rate controller Micardis-no HCT-previously on HCTZ 25mg  Hyperlipidemia-Lipitor-no recent blood work Elevated glucose-no diagnosis DM Gynecomastia-pt had mammogram  Past Medical History:  Diagnosis Date  . Arthritis   . 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 Essential hypertension   . Gynecomastia   . Hyperaldosteronism   . Postoperative atrial fibrillation ([place])   . Spinal stenosis   . Temporomandibular joint disease   . Wears glasses     Past Surgical History:  Procedure Laterality Date  . CATARACT EXTRACTION W/PHACO Right 01/28/2015   Procedure: CATARACT EXTRACTION PHACO AND INTRAOCULAR LENS PLACEMENT RIGHT EYE;  Surgeon: Williams Che, MD;  Location: [facility];  Service: Ophthalmology;  Laterality: Right;  CDE:27.23  . CATARACT EXTRACTION W/PHACO Left 04/14/2015   Procedure: CATARACT EXTRACTION PHACO AND INTRAOCULAR LENS PLACEMENT LEFT EYE CDE=15.86;  Surgeon: Williams Che, MD;  Location: [facility];  Service: Ophthalmology;  Laterality: Left;  . CERVICAL FUSION  1993  . CHOLECYSTECTOMY  1995  . COLONOSCOPY    . CORONARY ARTERY BYPASS GRAFT N/A 01/12/2017   Procedure: CORONARY ARTERY BYPASS GRAFTING (CABG);  Surgeon: Grace Isaac, MD;  Location: [facility];  Service: Open Heart Surgery;  Laterality: N/A;  Times 3 using left internal mammary artery to LAD and endoscopically harvested right thigh saphenous vein to OM and PD  . KNEE ARTHROSCOPY Right 10/09/2013   Procedure: RIGHT ARTHROSCOPY KNEE Partial medial and lateral meniscal tear and chondroplasty;  Surgeon: Hessie Dibble,  MD;  Location: [facility];  Service: Orthopedics;  Laterality: Right;  Partial medial and lateral meniscal tear and chondroplasty   . LEFT HEART CATH AND CORONARY ANGIOGRAPHY N/A 01/11/2017   Procedure: LEFT HEART CATH AND CORONARY ANGIOGRAPHY;  Surgeon: Jettie Booze, MD;  Location: [facility];  Service: Cardiovascular;  Laterality: N/A;  . TEE WITHOUT CARDIOVERSION N/A 01/12/2017   Procedure: TRANSESOPHAGEAL ECHOCARDIOGRAM (TEE);  Surgeon: Grace Isaac, MD;  Location: [facility];  Service: Open Heart Surgery;  Laterality: N/A;  . TONSILLECTOMY      Family History  Problem Relation Age of Onset  . Hypertension Other   . Cancer Brother        Colon  . Cancer Brother        Melanoma    Social History   Socioeconomic History  . Marital status: Married    Spouse name: Not on file  . Number of children: Not on file  . Years of education: Not on file  . Highest education level: Not on file  Occupational History  . Occupation: Pension scheme manager: RETIRED    Comment: Retired  Tobacco Use  . Smoking status: Never Smoker  . Smokeless tobacco: Never Used  Substance and Sexual Activity  . Alcohol use: Yes    Comment: Occasional  . Drug use: No  . Sexual activity: Not on file  Other Topics Concern  . Not on file  Social History Narrative  . Not on file   Social Determinants of Health   Financial Resource Strain:   . Difficulty of Paying Living Expenses:   Food Insecurity:   .  Worried About Charity fundraiser in the Last Year:   . Arboriculturist in the Last Year:   Transportation Needs:   . Film/video editor (Medical):   Marland Kitchen Lack of Transportation (Non-Medical):   Physical Activity:   . Days of Exercise per Week:   . Minutes of Exercise per Session:   Stress:   . Feeling of Stress :   Social Connections:   . Frequency of Communication with Friends and Family:   . Frequency of Social Gatherings with Friends and Family:   .  Attends Religious Services:   . Active Member of Clubs or Organizations:   . Attends Archivist Meetings:   Marland Kitchen Marital Status:   Intimate Partner Violence:   . Fear of Current or Ex-Partner:   . Emotionally Abused:   Marland Kitchen Physically Abused:   . Sexually Abused:     Outpatient Medications Prior to Visit  Medication Sig Dispense Refill  . acetaminophen (TYLENOL) 500 MG tablet Take 500 mg by mouth every 6 (six) hours as needed for mild pain.     Marland Kitchen aspirin EC 81 MG EC tablet Take 1 tablet (81 mg total) by mouth daily.    Marland Kitchen atorvastatin (LIPITOR) 20 MG tablet Take 20 mg by mouth daily.    . carvedilol (COREG) 6.25 MG tablet Take 1 tablet (6.25 mg total) by mouth 2 (two) times daily. 180 tablet 3  . eplerenone (INSPRA) 50 MG tablet Take 50 mg by mouth 2 (two) times daily.     . Multiple Vitamins-Minerals (MULTI COMPLETE PO) Take by mouth daily.    . potassium chloride SA (KLOR-CON) 20 MEQ tablet Take 40 mEq by mouth daily.    Marland Kitchen telmisartan (MICARDIS) 80 MG tablet Take 80 mg by mouth at bedtime.    . hydrochlorothiazide (HYDRODIURIL) 25 MG tablet Take 25 mg by mouth daily.    Marland Kitchen telmisartan (MICARDIS) 40 MG tablet TAKE (1) TABLET BY MOUTHTAT BEDTIME.TR     No facility-administered medications prior to visit.    Allergies  Allergen Reactions  . Diovan [Valsartan]     unknown    ROS Review of Systems  Constitutional: Negative.   Eyes: Negative.   Respiratory: Negative.   Cardiovascular:       Pt reported episode of palpitations with exertion x 1 week with rest resolved. NO SOB/CP  Gastrointestinal: Negative.   Endocrine:       Hyperaldosterone  Genitourinary: Positive for frequency.  Allergic/Immunologic: Negative.   Neurological: Negative.   Hematological: Negative.   Psychiatric/Behavioral: Negative.       Objective:    Physical Exam  Constitutional: He is oriented to person, place, and time. He appears well-developed.  HENT:  Head: Normocephalic and atraumatic.   Eyes: Conjunctivae are normal.  Cardiovascular: Normal rate, regular rhythm and normal heart sounds.  Pulmonary/Chest: Effort normal and breath sounds normal.  Musculoskeletal:        General: Normal range of motion.  Neurological: He is alert and oriented to person, place, and time.  Psychiatric: He has a normal mood and affect. His behavior is normal.    Pulse 67   Temp 97.9 F (36.6 C) (Temporal)   Ht 5' 6.5" (1.689 m)   Wt 159 lb (72.1 kg)   SpO2 97%   BMI 25.28 kg/m  Wt Readings from Last 3 Encounters:  05/28/19 159 lb (72.1 kg)  12/26/18 157 lb (71.2 kg)  01/10/18 165 lb (74.8 kg)  bp 178/98-right arm  Health Maintenance Due  Topic Date Due  . COVID-19 Vaccine (1) Never done  . TETANUS/TDAP  Never done  . PNA vac Low Risk Adult (1 of 2 - PCV13) Never done    Lab Results  Component Value Date   TSH 0.493 01/13/2017   Lab Results  Component Value Date   WBC 17.2 (H) 01/16/2017   HGB 11.7 (L) 01/16/2017   HCT 34.5 (L) 01/16/2017   MCV 88.0 01/16/2017   PLT 293 01/16/2017   Lab Results  Component Value Date   NA 134 (L) 01/16/2017   K 3.9 01/16/2017   CO2 26 01/16/2017   GLUCOSE 111 (H) 01/16/2017   BUN 15 01/16/2017   CREATININE 0.91 01/16/2017   BILITOT 0.8 11/16/2016   ALKPHOS 88 11/16/2016   AST 22 11/16/2016   ALT 39 (A) 11/16/2016   PROT 7.1 11/16/2016   ALBUMIN 4.3 11/16/2016   CALCIUM 7.9 (L) 01/16/2017   ANIONGAP 4 (L) 01/16/2017   Lab Results  Component Value Date   CHOL 137 01/12/2017   Lab Results  Component Value Date   HDL 39 (L) 01/12/2017   Lab Results  Component Value Date   LDLCALC 81 01/12/2017   Lab Results  Component Value Date   TRIG 84 01/12/2017   Lab Results  Component Value Date   CHOLHDL 3.5 01/12/2017   Lab Results  Component Value Date   HGBA1C 6.5 (A) 01/30/2018      Assessment & Plan:  1. Essential hypertension, benign Continue Coreg, Micardis, Inspra-recheck blood pressure at home first thing  in the morning-record bp/pulse rate-contact cardiology for evaluation Concern for change in blood pressure with episode with exertion. Currently-no CP/SOB/LE edema/Headaches or other concerns - COMPLETE METABOLIC PANEL WITH GFR - Lipid panel 2. Mixed hyperlipidemia Lipitor-no recent labwork  3. Elevated glucose 6.5% 1 year ago-no medications-repeat-pt understands risk of DM/CAD - Hemoglobin A1c  4. Coronary artery disease involving native coronary artery of native heart with angina pectoris ([place]) Stents-concern for hyperaldosteronemia and difficulty with bp control Problem List Items Addressed This Visit      Cardiovascular and Mediastinum   Essential hypertension, benign - Primary   Relevant Medications   telmisartan (MICARDIS) 40 MG tablet   telmisartan (MICARDIS) 80 MG tablet   Other Relevant Orders   COMPLETE METABOLIC PANEL WITH GFR   Lipid panel     Other   Mixed hyperlipidemia   Relevant Medications   telmisartan (MICARDIS) 40 MG tablet   telmisartan (MICARDIS) 80 MG tablet        Follow-up: cardiology-medication management-no longer taking HCTZ-previously on 5 medications   Demoni Gergen Hannah Beat, MD

## 2019-05-30 ENCOUNTER — Encounter: Payer: Self-pay | Admitting: Family Medicine

## 2019-05-30 DIAGNOSIS — I10 Essential (primary) hypertension: Secondary | ICD-10-CM | POA: Diagnosis not present

## 2019-05-30 DIAGNOSIS — R7309 Other abnormal glucose: Secondary | ICD-10-CM | POA: Diagnosis not present

## 2019-05-30 NOTE — Patient Instructions (Signed)
Cardiology labwork -fasting

## 2019-05-31 ENCOUNTER — Encounter: Payer: Self-pay | Admitting: Family Medicine

## 2019-05-31 LAB — COMPLETE METABOLIC PANEL WITH GFR
AG Ratio: 1.6 (calc) (ref 1.0–2.5)
ALT: 47 U/L — ABNORMAL HIGH (ref 9–46)
AST: 18 U/L (ref 10–35)
Albumin: 4.1 g/dL (ref 3.6–5.1)
Alkaline phosphatase (APISO): 59 U/L (ref 35–144)
BUN: 20 mg/dL (ref 7–25)
CO2: 32 mmol/L (ref 20–32)
Calcium: 9.4 mg/dL (ref 8.6–10.3)
Chloride: 104 mmol/L (ref 98–110)
Creat: 0.95 mg/dL (ref 0.70–1.18)
GFR, Est African American: 89 mL/min/{1.73_m2} (ref >=60)
GFR, Est Non African American: 76 mL/min/{1.73_m2} (ref >=60)
Globulin: 2.5 g/dL (calc) (ref 1.9–3.7)
Glucose, Bld: 124 mg/dL — ABNORMAL HIGH (ref 65–99)
Potassium: 4.2 mmol/L (ref 3.5–5.3)
Sodium: 141 mmol/L (ref 135–146)
Total Bilirubin: 1 mg/dL (ref 0.2–1.2)
Total Protein: 6.6 g/dL (ref 6.1–8.1)

## 2019-05-31 LAB — LIPID PANEL
Cholesterol: 154 mg/dL (ref <200)
HDL: 52 mg/dL (ref >=40)
LDL Cholesterol (Calc): 87 mg/dL (calc)
Non-HDL Cholesterol (Calc): 102 mg/dL (calc) (ref <130)
Total CHOL/HDL Ratio: 3 (calc) (ref <5.0)
Triglycerides: 67 mg/dL (ref <150)

## 2019-05-31 LAB — HEMOGLOBIN A1C
Hgb A1c MFr Bld: 6 % of total Hgb — ABNORMAL HIGH (ref <5.7)
Mean Plasma Glucose: 126 (calc)
eAG (mmol/L): 7 (calc)

## 2019-06-13 DIAGNOSIS — E782 Mixed hyperlipidemia: Secondary | ICD-10-CM | POA: Diagnosis not present

## 2019-06-13 DIAGNOSIS — N62 Hypertrophy of breast: Secondary | ICD-10-CM | POA: Diagnosis not present

## 2019-06-13 DIAGNOSIS — Z0000 Encounter for general adult medical examination without abnormal findings: Secondary | ICD-10-CM | POA: Diagnosis not present

## 2019-06-13 DIAGNOSIS — R319 Hematuria, unspecified: Secondary | ICD-10-CM | POA: Diagnosis not present

## 2019-06-13 DIAGNOSIS — I2510 Atherosclerotic heart disease of native coronary artery without angina pectoris: Secondary | ICD-10-CM | POA: Diagnosis not present

## 2019-06-13 DIAGNOSIS — M1711 Unilateral primary osteoarthritis, right knee: Secondary | ICD-10-CM | POA: Diagnosis not present

## 2019-06-13 DIAGNOSIS — I4891 Unspecified atrial fibrillation: Secondary | ICD-10-CM | POA: Diagnosis not present

## 2019-06-13 DIAGNOSIS — N182 Chronic kidney disease, stage 2 (mild): Secondary | ICD-10-CM | POA: Diagnosis not present

## 2019-06-13 DIAGNOSIS — I129 Hypertensive chronic kidney disease with stage 1 through stage 4 chronic kidney disease, or unspecified chronic kidney disease: Secondary | ICD-10-CM | POA: Diagnosis not present

## 2019-06-13 DIAGNOSIS — E2609 Other primary hyperaldosteronism: Secondary | ICD-10-CM | POA: Diagnosis not present

## 2019-06-18 ENCOUNTER — Telehealth: Payer: Self-pay | Admitting: Family Medicine

## 2019-06-18 ENCOUNTER — Other Ambulatory Visit: Payer: Self-pay

## 2019-06-18 DIAGNOSIS — E782 Mixed hyperlipidemia: Secondary | ICD-10-CM

## 2019-06-18 MED ORDER — ATORVASTATIN CALCIUM 20 MG PO TABS: 20.0000 mg | ORAL_TABLET | Freq: Every day | ORAL | 1 refills | Status: DC

## 2019-06-18 NOTE — Telephone Encounter (Signed)
Patient is requesting a 3 month supply on the medication below until he can find a new doctor.  atorvastatin (LIPITOR) 20 MG tablet   [place], [place] [facility] [place] Phone:  [phone]  Fax:  [phone]

## 2019-06-20 ENCOUNTER — Other Ambulatory Visit: Payer: Self-pay | Admitting: Nephrology

## 2019-06-20 DIAGNOSIS — E2609 Other primary hyperaldosteronism: Secondary | ICD-10-CM

## 2019-07-06 ENCOUNTER — Ambulatory Visit
Admission: RE | Admit: 2019-07-06 | Discharge: 2019-07-06 | Disposition: A | Discharge status: Home / Self Care | Payer: PPO | Source: Ambulatory Visit | Attending: Nephrology | Admitting: Nephrology

## 2019-07-06 DIAGNOSIS — E2609 Other primary hyperaldosteronism: Secondary | ICD-10-CM

## 2019-07-06 DIAGNOSIS — N134 Hydroureter: Secondary | ICD-10-CM | POA: Diagnosis not present

## 2019-07-06 MED ORDER — IOPAMIDOL (ISOVUE-300) INJECTION 61%
100.0000 mL | Freq: Once | INTRAVENOUS | Status: AC | PRN
  Administered 2019-07-06: 100 mL via INTRAVENOUS

## 2019-07-16 ENCOUNTER — Telehealth: Payer: Self-pay | Admitting: Cardiology

## 2019-07-16 DIAGNOSIS — N182 Chronic kidney disease, stage 2 (mild): Secondary | ICD-10-CM | POA: Diagnosis not present

## 2019-07-16 NOTE — Telephone Encounter (Signed)
  Patient Consent for Virtual Visit         Christopher Booth has provided verbal consent on 07/16/2019 for a virtual visit (video or telephone).   CONSENT FOR VIRTUAL VISIT FOR:  Christopher Booth  By participating in this virtual visit I agree to the following:  I hereby voluntarily request, consent and authorize [facility] and its employed or contracted physicians, physician assistants, nurse practitioners or other licensed health care professionals (the Practitioner), to provide me with telemedicine health care services (the "Services") as deemed necessary by the treating Practitioner. I acknowledge and consent to receive the Services by the Practitioner via telemedicine. I understand that the telemedicine visit will involve communicating with the Practitioner through live audiovisual communication technology and the disclosure of certain medical information by electronic transmission. I acknowledge that I have been given the opportunity to request an in-person assessment or other available alternative prior to the telemedicine visit and am voluntarily participating in the telemedicine visit.  I understand that I have the right to withhold or withdraw my consent to the use of telemedicine in the course of my care at any time, without affecting my right to future care or treatment, and that the Practitioner or I may terminate the telemedicine visit at any time. I understand that I have the right to inspect all information obtained and/or recorded in the course of the telemedicine visit and may receive copies of available information for a reasonable fee.  I understand that some of the potential risks of receiving the Services via telemedicine include:  Marland Kitchen Delay or interruption in medical evaluation due to technological equipment failure or disruption; . Information transmitted may not be sufficient (e.g. poor resolution of images) to allow for appropriate medical decision making by the Practitioner;  and/or  . In rare instances, security protocols could fail, causing a breach of personal health information.  Furthermore, I acknowledge that it is my responsibility to provide information about my medical history, conditions and care that is complete and accurate to the best of my ability. I acknowledge that Practitioner's advice, recommendations, and/or decision may be based on factors not within their control, such as incomplete or inaccurate data provided by me or distortions of diagnostic images or specimens that may result from electronic transmissions. I understand that the practice of medicine is not an exact science and that Practitioner makes no warranties or guarantees regarding treatment outcomes. I acknowledge that a copy of this consent can be made available to me via my patient portal ([facility]), or I can request a printed copy by calling the office of [facility].    I understand that my insurance will be billed for this visit.   I have read or had this consent read to me. . I understand the contents of this consent, which adequately explains the benefits and risks of the Services being provided via telemedicine.  . I have been provided ample opportunity to ask questions regarding this consent and the Services and have had my questions answered to my satisfaction. . I give my informed consent for the services to be provided through the use of telemedicine in my medical care

## 2019-07-17 ENCOUNTER — Encounter: Payer: Self-pay | Admitting: Cardiology

## 2019-07-17 ENCOUNTER — Telehealth (INDEPENDENT_AMBULATORY_CARE_PROVIDER_SITE_OTHER): Payer: PPO | Admitting: Cardiology

## 2019-07-17 VITALS — BP 154/93 | HR 62 | Ht 67.0 in | Wt 161.0 lb

## 2019-07-17 DIAGNOSIS — I25119 Atherosclerotic heart disease of native coronary artery with unspecified angina pectoris: Secondary | ICD-10-CM

## 2019-07-17 DIAGNOSIS — E269 Hyperaldosteronism, unspecified: Secondary | ICD-10-CM | POA: Diagnosis not present

## 2019-07-17 NOTE — Patient Instructions (Addendum)
Medication Instructions:   Your physician recommends that you continue on your current medications as directed. Please refer to the Current Medication list given to you today.  Labwork:  NONE  Testing/Procedures:  NONE  Follow-Up:  Your physician recommends that you schedule a follow-up appointment in: 6 months (office) in [place].   Any Other Special Instructions Will Be Listed Below (If Applicable).  If you need a refill on your cardiac medications before your next appointment, please call your pharmacy. 

## 2019-07-17 NOTE — Progress Notes (Signed)
Virtual Visit via Telephone Note   This visit type was conducted due to national recommendations for restrictions regarding the COVID-19 Pandemic (e.g. social distancing) in an effort to limit this patient's exposure and mitigate transmission in our community.  Due to his co-morbid illnesses, this patient is at least at moderate risk for complications without adequate follow up.  This format is felt to be most appropriate for this patient at this time.  The patient did not have access to video technology/had technical difficulties with video requiring transitioning to audio format only (telephone).  All issues noted in this document were discussed and addressed.  No physical exam could be performed with this format.  Please refer to the patient's chart for his  consent to telehealth for [facility].   The patient was identified using 2 identifiers.  Date:  07/17/2019   ID:  Christopher Booth, DOB Jul 03, 1940, MRN [id number]  Patient Location: Home Provider Location: Home  PCP:  Susy Frizzle, MD  Cardiologist:  Rozann Lesches, MD Electrophysiologist:  None   Evaluation Performed:  Follow-Up Visit  Chief Complaint:   Cardiac follow-up  History of Present Illness:    Christopher Booth is a 79 y.o. male last assessed via telehealth encounter in December 2020.  We spoke by phone today.  Overall, he is doing well from a cardiac perspective.  He does not describe any angina symptoms or worsening shortness of breath.  He is walking about a mile each day.  He did have an episode of palpitations when he and his wife were in [place], he was pushing her up hill in a transfer chair at the time and had to stop.  This was a limited event.  He has had no sudden dizziness or syncope.  He continues to see Dr. Jimmy Footman for management of hyperaldosteronism.  I reviewed his current medications which have been adjusted since our last encounter.  He did tolerate switching beta-blocker to Coreg.   He is in the process of further work-up including a recent abdominal and pelvic CT that identified a right adrenal mass, 24-hour urine collection and MRI pending.  He anticipates surgical resection.  I reviewed interval lab work as outlined below.  Past Medical History:  Diagnosis Date   Arthritis    CAD (coronary artery disease)    Multivessel/left main status post CABG 12/2016   Essential hypertension    Gynecomastia    Hyperaldosteronism    Postoperative atrial fibrillation ([place])    Spinal stenosis    Temporomandibular joint disease    Wears glasses    Past Surgical History:  Procedure Laterality Date   CATARACT EXTRACTION W/PHACO Right 01/28/2015   Procedure: CATARACT EXTRACTION PHACO AND INTRAOCULAR LENS PLACEMENT RIGHT EYE;  Surgeon: Williams Che, MD;  [facility];  Service: Ophthalmology;  Laterality: Right;  CDE:27.23   CATARACT EXTRACTION W/PHACO Left 04/14/2015   Procedure: CATARACT EXTRACTION PHACO AND INTRAOCULAR LENS PLACEMENT LEFT EYE CDE=15.86;  Surgeon: Williams Che, MD;  [facility];  Service: Ophthalmology;  Laterality: Left;   [place]   COLONOSCOPY     CORONARY ARTERY BYPASS GRAFT N/A 01/12/2017   Procedure: CORONARY ARTERY BYPASS GRAFTING (CABG);  Surgeon: Grace Isaac, MD;  Location: [facility];  Service: Open Heart Surgery;  Laterality: N/A;  Times 3 using left internal mammary artery to LAD and endoscopically harvested right thigh saphenous vein to OM and PD   KNEE  ARTHROSCOPY Right 10/09/2013   Procedure: RIGHT ARTHROSCOPY KNEE Partial medial and lateral meniscal tear and chondroplasty;  Surgeon: Hessie Dibble, MD;  Location: [facility];  Service: Orthopedics;  Laterality: Right;  Partial medial and lateral meniscal tear and chondroplasty    LEFT HEART CATH AND CORONARY ANGIOGRAPHY N/A 01/11/2017   Procedure: LEFT HEART CATH AND CORONARY ANGIOGRAPHY;  Surgeon: Jettie Booze, MD;  Location: [facility];  Service: Cardiovascular;  Laterality: N/A;   TEE WITHOUT CARDIOVERSION N/A 01/12/2017   Procedure: TRANSESOPHAGEAL ECHOCARDIOGRAM (TEE);  Surgeon: Grace Isaac, MD;  Location: [facility];  Service: Open Heart Surgery;  Laterality: N/A;   TONSILLECTOMY       Current Meds  Medication Sig   acetaminophen (TYLENOL) 500 MG tablet Take 500 mg by mouth every 6 (six) hours as needed for mild pain.    aspirin EC 81 MG EC tablet Take 1 tablet (81 mg total) by mouth daily.   atorvastatin (LIPITOR) 20 MG tablet Take 1 tablet (20 mg total) by mouth daily.   carvedilol (COREG) 6.25 MG tablet Take 1 tablet (6.25 mg total) by mouth 2 (two) times daily.   eplerenone (INSPRA) 50 MG tablet Take 50 mg by mouth 2 (two) times daily.    Multiple Vitamins-Minerals (MULTI COMPLETE PO) Take by mouth daily.   potassium chloride SA (KLOR-CON) 20 MEQ tablet Take 20 mEq by mouth daily.    telmisartan (MICARDIS) 80 MG tablet Take 80 mg by mouth at bedtime.     Allergies:   Diovan [valsartan]   ROS:   No recurring angina symptoms at this time.  Prior CV studies:   The following studies were reviewed today:  Echocardiogram 01/11/2017: Study Conclusions  - Left ventricle: The cavity size was normal. Wall thickness was increased in a pattern of mild LVH. Systolic function was normal. The estimated ejection fraction was in the range of 60% to 65%. Wall motion was normal; there were no regional wall motion abnormalities. Doppler parameters are consistent with abnormal left ventricular relaxation (grade 1 diastolic dysfunction). The E/e&' ratio is between 8-15, suggesting indeterminate LV filling pressure. - Aortic valve: Sclerosis without stenosis. There was trivial regurgitation. - Aorta: Aortic root dimension: 41 mm (ED). - Aortic root: The aortic root is mildly dilated. - Mitral valve: Calcified annulus. Mildly thickened leaflets  . There was trivial regurgitation. - Left atrium: The atrium was normal in size. - Inferior vena cava: The vessel was normal in size. The respirophasic diameter changes were in the normal range (= 50%), consistent with normal central venous pressure.  Urgent and Critical Findings: A non-critical finding, normal LV function, was reported to [name]. Impressions:  - LVEF 60-65%, mild LVH, normal wall motion, grade 1 DD, indeterminate LV filling pressure, aortic sclerosis with trivial AI, mildly dilated aortic root to 4.1 cm, aortic valve sclerosis, trace central AI, trivial MR, normal LA size, normal IVC  Cardiac catheterization 01/11/2017:  Ost Cx to Prox Cx lesion is 90% stenosed.  Mid LM to Dist LM lesion is 50% stenosed.  Prox to mid LAD lesion is 70% stenosed. Eccentric, best seen in the RAO caudal view.  Ost LAD to Prox LAD lesion is 40% stenosed.  Ost RCA to Prox RCA lesion is 80% stenosed.  Dist RCA lesion is 80% stenosed.  The left ventricular ejection fraction is 45-50% by visual estimate.  There is mild left ventricular systolic dysfunction.  LV end diastolic pressure is normal.  There is  no aortic valve stenosis.  Multivessel disease with culprit lesion being heavily calcified distal left main lesion extending into the circumflex ostium.  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  Recent Labs: 05/30/2019: ALT 47; BUN 20; Creat 0.95; Potassium 4.2; Sodium 141   Recent Lipid Panel Lab Results  Component Value Date/Time   CHOL 154 05/30/2019 08:43 AM   CHOL 153 11/16/2016 07:39 AM   TRIG 67 05/30/2019 08:43 AM   TRIG 126 11/16/2016 07:39 AM   HDL 52 05/30/2019 08:43 AM   HDL 47 11/16/2016 07:39 AM   CHOLHDL 3.0 05/30/2019 08:43 AM   LDLCALC 87 05/30/2019 08:43 AM    Wt Readings from Last 3  Encounters:  07/17/19 161 lb (73 kg)  05/28/19 159 lb (72.1 kg)  12/26/18 157 lb (71.2 kg)     Objective:    Vital Signs:  BP (!) 154/93    Pulse 62    Ht 5\' 7"  (1.702 m)    Wt 161 lb (73 kg)    BMI 25.22 kg/m    Patient spoke in full sentences, not short of breath.  ASSESSMENT & PLAN:    1.  Multivessel CAD status post CABG in December 2018.  He does not report any significant angina symptoms on medical therapy and is walking a mile a day for exercise without limitation.  Continue aspirin, Coreg, Lipitor, and Micardis.  No clear indication for follow-up ischemic testing at this time.  Anticipate office visit in 6 months.  2.  Hypertension in the setting of hyperaldosteronism.  He continues to see Dr. Jimmy Footman and is in the process of further work-up.  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  He anticipates surgical resection.  At this point I would not anticipate further cardiac work-up from a preoperative perspective in light of symptom stability on medical therapy and current functional capacity.   Time:   Today, I have spent 10 minutes with the patient with telehealth technology discussing the above problems.     Medication Adjustments/Labs and Tests Ordered: Current medicines are reviewed at length with the patient today.  Concerns regarding medicines are outlined above.   Tests Ordered: No orders of the defined types were placed in this encounter.   Medication Changes: No orders of the defined types were placed in this encounter.   Follow Up:  In Person 6 months in the [place] office.  Signed, Rozann Lesches, MD  07/17/2019 11:18 AM    [place]

## 2019-07-24 ENCOUNTER — Other Ambulatory Visit: Payer: Self-pay

## 2019-07-24 ENCOUNTER — Encounter: Payer: Self-pay | Admitting: Nurse Practitioner

## 2019-07-24 ENCOUNTER — Ambulatory Visit (INDEPENDENT_AMBULATORY_CARE_PROVIDER_SITE_OTHER): Payer: PPO | Admitting: Nurse Practitioner

## 2019-07-24 VITALS — BP 130/84 | HR 84 | Temp 97.6°F | Resp 18 | Wt 162.0 lb

## 2019-07-24 DIAGNOSIS — H6121 Impacted cerumen, right ear: Secondary | ICD-10-CM

## 2019-07-24 DIAGNOSIS — Z7689 Persons encountering health services in other specified circumstances: Secondary | ICD-10-CM

## 2019-07-24 DIAGNOSIS — I10 Essential (primary) hypertension: Secondary | ICD-10-CM | POA: Diagnosis not present

## 2019-07-24 DIAGNOSIS — E782 Mixed hyperlipidemia: Secondary | ICD-10-CM | POA: Diagnosis not present

## 2019-07-24 DIAGNOSIS — Z0000 Encounter for general adult medical examination without abnormal findings: Secondary | ICD-10-CM

## 2019-07-24 DIAGNOSIS — Z0001 Encounter for general adult medical examination with abnormal findings: Secondary | ICD-10-CM

## 2019-07-24 DIAGNOSIS — E1169 Type 2 diabetes mellitus with other specified complication: Secondary | ICD-10-CM

## 2019-07-24 NOTE — Progress Notes (Signed)
New Patient Office Visit  Subjective:  Patient ID: Christopher Booth, male    DOB: 10-11-1940  Age: 79 y.o. MRN: [id number]  CC:  Chief Complaint  Patient presents with   Establish Care    HPI Christopher Booth is a 79 year old male accompanied by his wife presenting to establish care and general adult examination. He health history and medications have been reviewed in Epic and discussed. He lives at home with his spouse. He exercises on a regular basis, feels in overall good health. Is anticipating an upcoming surgery to remove a right adrenal mass that could possibly be contributing to his DM 2 which is very well controlled with lifestyle. His disease prevention screenings will be reviewed both with him during visit and in the chart and addressed in treatment plan. The pt will have activated mychart for review. BP is stable at this time but has had some difficulty with unstable BP then found the adrenal mass which most likely is the secondary contributor.   No cp, ct, gu/gi sxs, pain, sob, edema, or falls. He is the caregiver of his wife with CVA old deficits.   Past Medical History:  Diagnosis Date   Arthritis    CAD (coronary artery disease)    Multivessel/left main status post CABG 12/2016   Essential hypertension    Gynecomastia    Hyperaldosteronism    Hypertension    Phreesia 07/21/2019   Myocardial infarction [facility])    Phreesia 07/21/2019   Postoperative atrial fibrillation (HCC)    Spinal stenosis    Temporomandibular joint disease    Wears glasses     Past Surgical History:  Procedure Laterality Date   CATARACT EXTRACTION W/PHACO Right 01/28/2015   Procedure: CATARACT EXTRACTION PHACO AND INTRAOCULAR LENS PLACEMENT RIGHT EYE;  Surgeon: Williams Che, MD;  Location: [facility];  Service: Ophthalmology;  Laterality: Right;  CDE:27.23   CATARACT EXTRACTION W/PHACO Left 04/14/2015   Procedure: CATARACT EXTRACTION PHACO AND INTRAOCULAR LENS PLACEMENT LEFT EYE  CDE=15.86;  Surgeon: Williams Che, MD;  Location: [facility];  Service: Ophthalmology;  Laterality: Left;   [place]   COLONOSCOPY     CORONARY ARTERY BYPASS GRAFT N/A 01/12/2017   Procedure: CORONARY ARTERY BYPASS GRAFTING (CABG);  Surgeon: Grace Isaac, MD;  Location: [facility];  Service: Open Heart Surgery;  Laterality: N/A;  Times 3 using left internal mammary artery to LAD and endoscopically harvested right thigh saphenous vein to OM and PD   EYE SURGERY N/A    Phreesia 07/21/2019   KNEE ARTHROSCOPY Right 10/09/2013   Procedure: RIGHT ARTHROSCOPY KNEE Partial medial and lateral meniscal tear and chondroplasty;  Surgeon: Hessie Dibble, MD;  Location: [facility];  Service: Orthopedics;  Laterality: Right;  Partial medial and lateral meniscal tear and chondroplasty    LEFT HEART CATH AND CORONARY ANGIOGRAPHY N/A 01/11/2017   Procedure: LEFT HEART CATH AND CORONARY ANGIOGRAPHY;  Surgeon: Jettie Booze, MD;  Location: [facility];  Service: Cardiovascular;  Laterality: N/A;   TEE WITHOUT CARDIOVERSION N/A 01/12/2017   Procedure: TRANSESOPHAGEAL ECHOCARDIOGRAM (TEE);  Surgeon: Grace Isaac, MD;  Location: [facility];  Service: Open Heart Surgery;  Laterality: N/A;   TONSILLECTOMY     VASECTOMY N/A    Phreesia 07/21/2019    Family History  Problem Relation Age of Onset   Hypertension Other    Cancer Brother  Colon   Cancer Brother        Melanoma    Social History   Socioeconomic History   Marital status: Married    Spouse name: Not on file   Number of children: Not on file   Years of education: Not on file   Highest education level: Not on file  Occupational History   Occupation: Nurse Engineer, petroleum: RETIRED    Comment: Retired  Tobacco Use   Smoking status: Never Smoker   Smokeless tobacco: Never Used  Scientific laboratory technician Use: Never used  Substance and Sexual  Activity   Alcohol use: Yes    Comment: Occasional   Drug use: No   Sexual activity: Not Currently  Other Topics Concern   Not on file  Social History Narrative   Not on file   Social Determinants of Health   Financial Resource Strain:    Difficulty of Paying Living Expenses:   Food Insecurity:    Worried About Charity fundraiser in the Last Year:    Arboriculturist in the Last Year:   Transportation Needs:    Film/video editor (Medical):    Lack of Transportation (Non-Medical):   Physical Activity:    Days of Exercise per Week:    Minutes of Exercise per Session:   Stress:    Feeling of Stress :   Social Connections:    Frequency of Communication with Friends and Family:    Frequency of Social Gatherings with Friends and Family:    Attends Religious Services:    Active Member of Clubs or Organizations:    Attends Music therapist:    Marital Status:   Intimate Partner Violence:    Fear of Current or Ex-Partner:    Emotionally Abused:    Physically Abused:    Sexually Abused:     ROS Review of Systems  All other systems reviewed and are negative.   Objective:   Today's Vitals: BP 130/84 (BP Location: Left Arm, Patient Position: Sitting, Cuff Size: Normal)    Pulse 84    Temp 97.6 F (36.4 C) (Temporal)    Resp 18    Wt 162 lb (73.5 kg)    SpO2 97%    BMI 25.37 kg/m   Physical Exam Vitals and nursing note reviewed.  Constitutional:      Appearance: Normal appearance. He is well-groomed.  HENT:     Head: Normocephalic.     Right Ear: Hearing, ear canal and external ear normal. There is impacted cerumen.     Left Ear: Hearing, tympanic membrane, ear canal and external ear normal.  Eyes:     General: Lids are normal. Lids are everted, no foreign bodies appreciated.     Extraocular Movements: Extraocular movements intact.     Right eye: No nystagmus.     Left eye: No nystagmus.     Conjunctiva/sclera: Conjunctivae  normal.     Pupils: Pupils are equal, round, and reactive to light.  Neck:     Thyroid: No thyromegaly or thyroid tenderness.     Vascular: No carotid bruit or JVD.  Cardiovascular:     Rate and Rhythm: Normal rate and regular rhythm.     Pulses: Normal pulses.     Heart sounds: Normal heart sounds, S1 normal and S2 normal.  Pulmonary:     Effort: Pulmonary effort is normal.     Breath sounds: Normal breath sounds.  Abdominal:  General: Abdomen is flat. Bowel sounds are normal. There is no distension or abdominal bruit.     Palpations: Abdomen is soft. There is no hepatomegaly or splenomegaly.     Tenderness: There is no abdominal tenderness. There is no right CVA tenderness, left CVA tenderness or rebound.  Musculoskeletal:        General: Normal range of motion.     Cervical back: Normal, normal range of motion and neck supple.     Thoracic back: Normal.     Lumbar back: Normal.     Right lower leg: No edema.     Left lower leg: No edema.  Lymphadenopathy:     Head:     Right side of head: No submental, submandibular or tonsillar adenopathy.     Left side of head: No submental, submandibular or tonsillar adenopathy.     Cervical: No cervical adenopathy.  Skin:    General: Skin is warm and dry.     Capillary Refill: Capillary refill takes less than 2 seconds.     Coloration: Skin is not jaundiced or pale.     Findings: No bruising.  Neurological:     General: No focal deficit present.     Mental Status: He is alert and oriented to person, place, and time.  Psychiatric:        Attention and Perception: Attention and perception normal.        Mood and Affect: Mood and affect normal.        Speech: Speech normal.        Behavior: Behavior normal. Behavior is cooperative.        Thought Content: Thought content normal.        Cognition and Memory: Cognition and memory normal.        Judgment: Judgment normal.     Assessment & Plan:   Problem List Items Addressed This  Visit      Cardiovascular and Mediastinum   Essential hypertension, benign     Other   Mixed hyperlipidemia    Other Visit Diagnoses    Type 2 diabetes mellitus with other specified complication, without long-term current use of insulin ([place])    -  Primary   Relevant Orders   Microalbumin, urine   Hemoglobin A1c   Encounter to establish care       Impacted cerumen of right ear       Relevant Orders   Ear Lavage   Adult general medical exam        -established care today with general adult examination Unsuccessful lavage of right ear with cerumen impaction -continue flushing using over the counter Debrox.  - follow a low fat, cholesterol, carbohydrate, sugar diet that is rich in fiber - drink plenty of water - get 20 minutes of exercise at least 4 times per week if not already -complete urine microabuminuria today this will be once annually related to Diabetes -lab draw only in 4 months for A1C -It appears that you may be due for your pneumonia vaccination, tetanus vaccination, Hepatitis C lab screening. We can do all except for tetanus when you come in for the A1C draw if you will call before coming in so that we can prepare for your visit. You may obtain the Td from your local pharmacy at any time. Please let us know once completed.  -goal blood pressure 140/90 or less 3 hours after taking your blood pressure controlling medications, seek medical attention for out of this range. HTN:  stable Outpatient Encounter Medications as of 07/24/2019  Medication Sig   acetaminophen (TYLENOL) 500 MG tablet Take 500 mg by mouth every 6 (six) hours as needed for mild pain.    aspirin EC 81 MG EC tablet Take 1 tablet (81 mg total) by mouth daily.   atorvastatin (LIPITOR) 20 MG tablet Take 1 tablet (20 mg total) by mouth daily.   carvedilol (COREG) 6.25 MG tablet Take 1 tablet (6.25 mg total) by mouth 2 (two) times daily.   eplerenone (INSPRA) 50 MG tablet Take 50 mg by mouth 2 (two) times  daily.    Multiple Vitamins-Minerals (MULTI COMPLETE PO) Take by mouth daily.   potassium chloride SA (KLOR-CON) 20 MEQ tablet Take 20 mEq by mouth daily.    telmisartan (MICARDIS) 80 MG tablet Take 80 mg by mouth at bedtime.   [DISCONTINUED] telmisartan-hydrochlorothiazide (MICARDIS HCT) 80-12.5 MG per tablet Take 1 tablet by mouth daily.   No facility-administered encounter medications on file as of 07/24/2019.    Follow-up: Return in about 4 months (around 11/23/2019) for a1c repeat with Nursing.   Annie Main, FNP

## 2019-07-24 NOTE — Patient Instructions (Addendum)
-  established care today with general adult examination Unsuccessful lavage of right ear with cerumen impaction -continue flushing using over the counter Debrox.  - follow a low fat, cholesterol, carbohydrate, sugar diet that is rich in fiber - drink plenty of water - get 20 minutes of exercise at least 4 times per week if not already -complete urine microabuminuria today this will be once annually related to Diabetes -lab draw only in 4 months for A1C -It appears that you may be due for your pneumonia vaccination, tetanus vaccination, Hepatitis C lab screening. We can do all except for tetanus when you come in for the A1C draw if you will call before coming in so that we can prepare for your visit. You may obtain the Td from your local pharmacy at any time. Please let us know once completed.  -goal blood pressure 140/90 or less 3 hours after taking your blood pressure controlling medications, seek medical attention for out of this range. HTN: stable

## 2019-07-25 LAB — MICROALBUMIN, URINE: Microalb, Ur: 6.5 mg/dL

## 2019-07-26 ENCOUNTER — Other Ambulatory Visit: Payer: Self-pay | Admitting: Nephrology

## 2019-07-26 DIAGNOSIS — N182 Chronic kidney disease, stage 2 (mild): Secondary | ICD-10-CM

## 2019-07-26 DIAGNOSIS — E278 Other specified disorders of adrenal gland: Secondary | ICD-10-CM

## 2019-08-01 ENCOUNTER — Ambulatory Visit
Admission: RE | Admit: 2019-08-01 | Discharge: 2019-08-01 | Disposition: A | Discharge status: Home / Self Care | Payer: PPO | Source: Ambulatory Visit | Attending: Nephrology | Admitting: Nephrology

## 2019-08-01 DIAGNOSIS — G9589 Other specified diseases of spinal cord: Secondary | ICD-10-CM | POA: Diagnosis not present

## 2019-08-01 DIAGNOSIS — Z9049 Acquired absence of other specified parts of digestive tract: Secondary | ICD-10-CM | POA: Diagnosis not present

## 2019-08-01 DIAGNOSIS — N182 Chronic kidney disease, stage 2 (mild): Secondary | ICD-10-CM

## 2019-08-01 DIAGNOSIS — D3500 Benign neoplasm of unspecified adrenal gland: Secondary | ICD-10-CM | POA: Diagnosis not present

## 2019-08-01 DIAGNOSIS — E278 Other specified disorders of adrenal gland: Secondary | ICD-10-CM | POA: Diagnosis not present

## 2019-08-07 DIAGNOSIS — E278 Other specified disorders of adrenal gland: Secondary | ICD-10-CM | POA: Diagnosis not present

## 2019-08-14 DIAGNOSIS — I129 Hypertensive chronic kidney disease with stage 1 through stage 4 chronic kidney disease, or unspecified chronic kidney disease: Secondary | ICD-10-CM | POA: Diagnosis not present

## 2019-08-28 ENCOUNTER — Ambulatory Visit: Payer: Self-pay | Admitting: General Surgery

## 2019-09-13 ENCOUNTER — Other Ambulatory Visit (HOSPITAL_COMMUNITY): Payer: Self-pay | Admitting: Neurosurgery

## 2019-09-13 DIAGNOSIS — M21371 Foot drop, right foot: Secondary | ICD-10-CM

## 2019-09-13 DIAGNOSIS — M48062 Spinal stenosis, lumbar region with neurogenic claudication: Secondary | ICD-10-CM | POA: Diagnosis not present

## 2019-09-13 DIAGNOSIS — M479 Spondylosis, unspecified: Secondary | ICD-10-CM | POA: Diagnosis not present

## 2019-09-13 DIAGNOSIS — M21379 Foot drop, unspecified foot: Secondary | ICD-10-CM | POA: Insufficient documentation

## 2019-09-13 DIAGNOSIS — Z6826 Body mass index (BMI) 26.0-26.9, adult: Secondary | ICD-10-CM | POA: Insufficient documentation

## 2019-09-13 DIAGNOSIS — M5416 Radiculopathy, lumbar region: Secondary | ICD-10-CM | POA: Diagnosis not present

## 2019-09-13 DIAGNOSIS — G9519 Other vascular myelopathies: Secondary | ICD-10-CM | POA: Insufficient documentation

## 2019-09-14 ENCOUNTER — Ambulatory Visit (HOSPITAL_COMMUNITY)
Admission: RE | Admit: 2019-09-14 | Discharge: 2019-09-14 | Disposition: A | Discharge status: Home / Self Care | Payer: PPO | Source: Ambulatory Visit | Attending: Neurosurgery | Admitting: Neurosurgery

## 2019-09-14 ENCOUNTER — Other Ambulatory Visit: Payer: Self-pay

## 2019-09-14 DIAGNOSIS — M47816 Spondylosis without myelopathy or radiculopathy, lumbar region: Secondary | ICD-10-CM | POA: Diagnosis not present

## 2019-09-14 DIAGNOSIS — M21371 Foot drop, right foot: Secondary | ICD-10-CM | POA: Diagnosis not present

## 2019-09-14 DIAGNOSIS — M4807 Spinal stenosis, lumbosacral region: Secondary | ICD-10-CM | POA: Diagnosis not present

## 2019-09-14 DIAGNOSIS — M5126 Other intervertebral disc displacement, lumbar region: Secondary | ICD-10-CM | POA: Diagnosis not present

## 2019-09-14 DIAGNOSIS — M48061 Spinal stenosis, lumbar region without neurogenic claudication: Secondary | ICD-10-CM | POA: Diagnosis not present

## 2019-09-17 ENCOUNTER — Other Ambulatory Visit: Payer: Self-pay | Admitting: Neurological Surgery

## 2019-09-17 DIAGNOSIS — M5416 Radiculopathy, lumbar region: Secondary | ICD-10-CM | POA: Diagnosis not present

## 2019-09-18 ENCOUNTER — Other Ambulatory Visit (HOSPITAL_COMMUNITY)
Admission: RE | Admit: 2019-09-18 | Discharge: 2019-09-18 | Disposition: A | Discharge status: Home / Self Care | Payer: PPO | Source: Ambulatory Visit | Attending: Neurological Surgery | Admitting: Neurological Surgery

## 2019-09-18 DIAGNOSIS — Z01812 Encounter for preprocedural laboratory examination: Secondary | ICD-10-CM | POA: Diagnosis not present

## 2019-09-18 DIAGNOSIS — Z20822 Contact with and (suspected) exposure to covid-19: Secondary | ICD-10-CM | POA: Insufficient documentation

## 2019-09-18 LAB — SARS CORONAVIRUS 2 (TAT 6-24 HRS): SARS Coronavirus 2: NEGATIVE

## 2019-09-19 ENCOUNTER — Encounter (HOSPITAL_COMMUNITY): Payer: Self-pay | Admitting: Neurological Surgery

## 2019-09-19 ENCOUNTER — Other Ambulatory Visit: Payer: Self-pay

## 2019-09-19 ENCOUNTER — Telehealth: Payer: Self-pay | Admitting: Cardiology

## 2019-09-19 NOTE — Anesthesia Preprocedure Evaluation (Addendum)
Anesthesia Evaluation  Patient identified by MRN, date of birth, ID band Patient awake    Reviewed: Allergy & Precautions, H&P , NPO status , Patient's Chart, lab work & pertinent test results  Airway Mallampati: II  TM Distance: >3 FB Neck ROM: Full    Dental no notable dental hx. (+) Teeth Intact, Dental Advisory Given   Pulmonary neg pulmonary ROS,    Pulmonary exam normal breath sounds clear to auscultation       Cardiovascular Exercise Tolerance: Good hypertension, Pt. on medications and Pt. on home beta blockers + CAD and + CABG  + dysrhythmias Atrial Fibrillation  Rhythm:Regular Rate:Normal     Neuro/Psych negative neurological ROS  negative psych ROS   GI/Hepatic negative GI ROS, Neg liver ROS,   Endo/Other  negative endocrine ROS  Renal/GU negative Renal ROS  negative genitourinary   Musculoskeletal  (+) Arthritis , Osteoarthritis,    Abdominal   Peds  Hematology negative hematology ROS (+)   Anesthesia Other Findings   Reproductive/Obstetrics negative OB ROS                            Anesthesia Physical Anesthesia Plan  ASA: III  Anesthesia Plan: General   Post-op Pain Management:    Induction: Intravenous  PONV Risk Score and Plan: 3 and Ondansetron, Dexamethasone and Treatment may vary due to age or medical condition  Airway Management Planned: Oral ETT  Additional Equipment:   Intra-op Plan:   Post-operative Plan: Extubation in OR  Informed Consent: I have reviewed the patients History and Physical, chart, labs and discussed the procedure including the risks, benefits and alternatives for the proposed anesthesia with the patient or authorized representative who has indicated his/her understanding and acceptance.     Dental advisory given  Plan Discussed with: CRNA  Anesthesia Plan Comments:        Anesthesia Quick Evaluation

## 2019-09-19 NOTE — Progress Notes (Signed)
Pt denies SOB and chest pain. Pt stated that he is under the care of [name], Cardiology and Ishmael Holter, NP. Pt denies having an EKG and chest x ray in the last year. Pt denies recent labs. Pt stated that last dose of Aspirin was " Saturday" as instructed. Pt made aware to stop taking vitamins, fish oil and herbal medications. Do not take any NSAIDs ie: Ibuprofen, Advil, Naproxen (Aleve), Motrin, BC and Goody Powder. Pt reminded to quarantine. Pt verbalized understanding of all pre-op instructions. PA, Anesthesiology, asked to review pt history.

## 2019-09-19 NOTE — Telephone Encounter (Signed)
Contacted patient to help with concerns and he says that his question has already been taken care of.

## 2019-09-19 NOTE — Telephone Encounter (Signed)
New message     Patient wants to talk to Dr Gwen Her - he is having back surgery tomorrow - I asked patient to have the office that is doing the surgery to give [place] a call so that we have proper surgical clearance documentation - he states that we never return calls and hung up .

## 2019-09-20 ENCOUNTER — Encounter (HOSPITAL_COMMUNITY)
Admission: RE | Disposition: A | Discharge status: Home / Self Care | Payer: Self-pay | Source: Home / Self Care | Attending: Neurological Surgery

## 2019-09-20 ENCOUNTER — Encounter (HOSPITAL_COMMUNITY): Payer: Self-pay | Admitting: Neurological Surgery

## 2019-09-20 ENCOUNTER — Ambulatory Visit (HOSPITAL_COMMUNITY): Payer: PPO

## 2019-09-20 ENCOUNTER — Ambulatory Visit (HOSPITAL_COMMUNITY): Payer: PPO | Admitting: Anesthesiology

## 2019-09-20 ENCOUNTER — Observation Stay (HOSPITAL_COMMUNITY)
Admission: RE | Admit: 2019-09-20 | Discharge: 2019-09-20 | Disposition: A | Discharge status: Home / Self Care | Payer: PPO | Attending: Neurological Surgery | Admitting: Neurological Surgery

## 2019-09-20 DIAGNOSIS — I10 Essential (primary) hypertension: Secondary | ICD-10-CM | POA: Insufficient documentation

## 2019-09-20 DIAGNOSIS — Z955 Presence of coronary angioplasty implant and graft: Secondary | ICD-10-CM | POA: Insufficient documentation

## 2019-09-20 DIAGNOSIS — M5416 Radiculopathy, lumbar region: Secondary | ICD-10-CM | POA: Diagnosis present

## 2019-09-20 DIAGNOSIS — M4807 Spinal stenosis, lumbosacral region: Principal | ICD-10-CM | POA: Insufficient documentation

## 2019-09-20 DIAGNOSIS — E782 Mixed hyperlipidemia: Secondary | ICD-10-CM | POA: Diagnosis not present

## 2019-09-20 DIAGNOSIS — M21379 Foot drop, unspecified foot: Secondary | ICD-10-CM | POA: Diagnosis not present

## 2019-09-20 DIAGNOSIS — I2510 Atherosclerotic heart disease of native coronary artery without angina pectoris: Secondary | ICD-10-CM | POA: Insufficient documentation

## 2019-09-20 DIAGNOSIS — M4727 Other spondylosis with radiculopathy, lumbosacral region: Secondary | ICD-10-CM | POA: Insufficient documentation

## 2019-09-20 DIAGNOSIS — Z981 Arthrodesis status: Secondary | ICD-10-CM | POA: Diagnosis not present

## 2019-09-20 DIAGNOSIS — Z7982 Long term (current) use of aspirin: Secondary | ICD-10-CM | POA: Diagnosis not present

## 2019-09-20 DIAGNOSIS — Z419 Encounter for procedure for purposes other than remedying health state, unspecified: Secondary | ICD-10-CM

## 2019-09-20 DIAGNOSIS — M21371 Foot drop, right foot: Secondary | ICD-10-CM | POA: Insufficient documentation

## 2019-09-20 DIAGNOSIS — I252 Old myocardial infarction: Secondary | ICD-10-CM | POA: Diagnosis not present

## 2019-09-20 DIAGNOSIS — I4891 Unspecified atrial fibrillation: Secondary | ICD-10-CM | POA: Insufficient documentation

## 2019-09-20 DIAGNOSIS — Z951 Presence of aortocoronary bypass graft: Secondary | ICD-10-CM | POA: Insufficient documentation

## 2019-09-20 DIAGNOSIS — Z79899 Other long term (current) drug therapy: Secondary | ICD-10-CM | POA: Insufficient documentation

## 2019-09-20 DIAGNOSIS — M48061 Spinal stenosis, lumbar region without neurogenic claudication: Secondary | ICD-10-CM | POA: Diagnosis not present

## 2019-09-20 HISTORY — DX: Radiculopathy, lumbar region: M54.16

## 2019-09-20 HISTORY — PX: LUMBAR LAMINECTOMY/DECOMPRESSION MICRODISCECTOMY: SHX5026

## 2019-09-20 HISTORY — DX: Prediabetes: R73.03

## 2019-09-20 HISTORY — DX: Other specified disorders of adrenal gland: E27.8

## 2019-09-20 HISTORY — DX: Pneumonia, unspecified organism: J18.9

## 2019-09-20 LAB — BASIC METABOLIC PANEL
Anion gap: 10 (ref 5–15)
BUN: 17 mg/dL (ref 8–23)
CO2: 30 mmol/L (ref 22–32)
Calcium: 9.4 mg/dL (ref 8.9–10.3)
Chloride: 98 mmol/L (ref 98–111)
Creatinine, Ser: 1.11 mg/dL (ref 0.61–1.24)
GFR calc Af Amer: >60 mL/min (ref >60)
GFR calc non Af Amer: >60 mL/min (ref >60)
Glucose, Bld: 151 mg/dL — ABNORMAL HIGH (ref 70–99)
Potassium: 3.4 mmol/L — ABNORMAL LOW (ref 3.5–5.1)
Sodium: 138 mmol/L (ref 135–145)

## 2019-09-20 LAB — CBC
HCT: 48 % (ref 39.0–52.0)
Hemoglobin: 16 g/dL (ref 13.0–17.0)
MCH: 31.1 pg (ref 26.0–34.0)
MCHC: 33.3 g/dL (ref 30.0–36.0)
MCV: 93.2 fL (ref 80.0–100.0)
Platelets: 235 10*3/uL (ref 150–400)
RBC: 5.15 MIL/uL (ref 4.22–5.81)
RDW: 12.4 % (ref 11.5–15.5)
WBC: 10.3 10*3/uL (ref 4.0–10.5)
nRBC: 0 % (ref 0.0–0.2)

## 2019-09-20 LAB — SURGICAL PCR SCREEN
MRSA, PCR: NEGATIVE
Staphylococcus aureus: NEGATIVE

## 2019-09-20 SURGERY — LUMBAR LAMINECTOMY/DECOMPRESSION MICRODISCECTOMY 2 LEVELS
Anesthesia: General | Site: Back | Laterality: Right

## 2019-09-20 MED ORDER — PROPOFOL 10 MG/ML IV BOLUS
INTRAVENOUS | Status: DC | PRN
  Administered 2019-09-20: 120 mg via INTRAVENOUS

## 2019-09-20 MED ORDER — KETOROLAC TROMETHAMINE 15 MG/ML IJ SOLN: 7.5000 mg | Freq: Four times a day (QID) | INTRAMUSCULAR | Status: DC

## 2019-09-20 MED ORDER — MIDAZOLAM HCL 2 MG/2ML IJ SOLN
INTRAMUSCULAR | Status: AC
  Filled 2019-09-20: qty 2

## 2019-09-20 MED ORDER — ROCURONIUM BROMIDE 10 MG/ML (PF) SYRINGE
PREFILLED_SYRINGE | INTRAVENOUS | Status: DC | PRN
  Administered 2019-09-20: 50 mg via INTRAVENOUS
  Administered 2019-09-20: 20 mg via INTRAVENOUS

## 2019-09-20 MED ORDER — FENTANYL CITRATE (PF) 250 MCG/5ML IJ SOLN
INTRAMUSCULAR | Status: AC
  Filled 2019-09-20: qty 5

## 2019-09-20 MED ORDER — CEFAZOLIN SODIUM-DEXTROSE 2-3 GM-%(50ML) IV SOLR
INTRAVENOUS | Status: DC | PRN
  Administered 2019-09-20: 2 g via INTRAVENOUS

## 2019-09-20 MED ORDER — 0.9 % SODIUM CHLORIDE (POUR BTL) OPTIME
TOPICAL | Status: DC | PRN
  Administered 2019-09-20: 1000 mL

## 2019-09-20 MED ORDER — ACETAMINOPHEN 650 MG RE SUPP: 650.0000 mg | RECTAL | Status: DC | PRN

## 2019-09-20 MED ORDER — POTASSIUM CHLORIDE ER 10 MEQ PO TBCR
10.0000 meq | EXTENDED_RELEASE_TABLET | Freq: Every day | ORAL | Status: DC
  Filled 2019-09-20: qty 1

## 2019-09-20 MED ORDER — SODIUM CHLORIDE 0.9% FLUSH: 3.0000 mL | INTRAVENOUS | Status: DC | PRN

## 2019-09-20 MED ORDER — CARVEDILOL 6.25 MG PO TABS: 6.2500 mg | ORAL_TABLET | Freq: Two times a day (BID) | ORAL | Status: DC

## 2019-09-20 MED ORDER — FENTANYL CITRATE (PF) 250 MCG/5ML IJ SOLN
INTRAMUSCULAR | Status: DC | PRN
Start: 2019-09-20 — End: 2019-09-20
  Administered 2019-09-20 (×4): 50 ug via INTRAVENOUS

## 2019-09-20 MED ORDER — OXYCODONE-ACETAMINOPHEN 5-325 MG PO TABS: 1.0000 | ORAL_TABLET | ORAL | Status: DC | PRN

## 2019-09-20 MED ORDER — SUGAMMADEX SODIUM 200 MG/2ML IV SOLN
INTRAVENOUS | Status: DC | PRN
  Administered 2019-09-20: 200 mg via INTRAVENOUS

## 2019-09-20 MED ORDER — SODIUM CHLORIDE 0.9% FLUSH: 3.0000 mL | Freq: Two times a day (BID) | INTRAVENOUS | Status: DC

## 2019-09-20 MED ORDER — PROPOFOL 10 MG/ML IV BOLUS
INTRAVENOUS | Status: AC
  Filled 2019-09-20: qty 20

## 2019-09-20 MED ORDER — ATORVASTATIN CALCIUM 10 MG PO TABS: 20.0000 mg | ORAL_TABLET | Freq: Every day | ORAL | Status: DC

## 2019-09-20 MED ORDER — MENTHOL 3 MG MT LOZG: 1.0000 | LOZENGE | OROMUCOSAL | Status: DC | PRN

## 2019-09-20 MED ORDER — BUPIVACAINE HCL (PF) 0.5 % IJ SOLN
INTRAMUSCULAR | Status: DC | PRN
  Administered 2019-09-20 (×2): 10 mL

## 2019-09-20 MED ORDER — MORPHINE SULFATE (PF) 2 MG/ML IV SOLN: 1.0000 mg | INTRAVENOUS | Status: DC | PRN

## 2019-09-20 MED ORDER — METHOCARBAMOL 1000 MG/10ML IJ SOLN
500.0000 mg | Freq: Four times a day (QID) | INTRAVENOUS | Status: DC | PRN
  Filled 2019-09-20: qty 5

## 2019-09-20 MED ORDER — THROMBIN 5000 UNITS EX SOLR
CUTANEOUS | Status: AC
  Filled 2019-09-20: qty 5000

## 2019-09-20 MED ORDER — FENTANYL CITRATE (PF) 100 MCG/2ML IJ SOLN: 25.0000 ug | INTRAMUSCULAR | Status: DC | PRN

## 2019-09-20 MED ORDER — ACETAMINOPHEN 325 MG PO TABS: 650.0000 mg | ORAL_TABLET | ORAL | Status: DC | PRN

## 2019-09-20 MED ORDER — METHOCARBAMOL 500 MG PO TABS: 500.0000 mg | ORAL_TABLET | Freq: Four times a day (QID) | ORAL | Status: DC | PRN

## 2019-09-20 MED ORDER — PHENYLEPHRINE HCL-NACL 10-0.9 MG/250ML-% IV SOLN
INTRAVENOUS | Status: DC | PRN
  Administered 2019-09-20: 20 ug/min via INTRAVENOUS

## 2019-09-20 MED ORDER — LIDOCAINE-EPINEPHRINE 1 %-1:100000 IJ SOLN
INTRAMUSCULAR | Status: DC | PRN
  Administered 2019-09-20: 10 mL

## 2019-09-20 MED ORDER — BUPIVACAINE HCL (PF) 0.5 % IJ SOLN
INTRAMUSCULAR | Status: AC
  Filled 2019-09-20: qty 30

## 2019-09-20 MED ORDER — SPIRONOLACTONE 25 MG PO TABS: 25.0000 mg | ORAL_TABLET | Freq: Every day | ORAL | Status: DC

## 2019-09-20 MED ORDER — LACTATED RINGERS IV SOLN: INTRAVENOUS | Status: DC

## 2019-09-20 MED ORDER — SODIUM CHLORIDE 0.9 % IV SOLN
INTRAVENOUS | Status: DC | PRN
  Administered 2019-09-20: 500 mL

## 2019-09-20 MED ORDER — ACETAMINOPHEN 500 MG PO TABS
1000.0000 mg | ORAL_TABLET | Freq: Once | ORAL | Status: AC
  Administered 2019-09-20: 1000 mg via ORAL
  Filled 2019-09-20: qty 2

## 2019-09-20 MED ORDER — ONDANSETRON HCL 4 MG/2ML IJ SOLN: 4.0000 mg | Freq: Four times a day (QID) | INTRAMUSCULAR | Status: DC | PRN

## 2019-09-20 MED ORDER — LIDOCAINE-EPINEPHRINE 1 %-1:100000 IJ SOLN
INTRAMUSCULAR | Status: AC
  Filled 2019-09-20: qty 1

## 2019-09-20 MED ORDER — ORAL CARE MOUTH RINSE
15.0000 mL | Freq: Once | OROMUCOSAL | Status: AC
  Administered 2019-09-20: 15 mL via OROMUCOSAL

## 2019-09-20 MED ORDER — SODIUM CHLORIDE 0.9 % IV SOLN: 250.0000 mL | INTRAVENOUS | Status: DC

## 2019-09-20 MED ORDER — MIDAZOLAM HCL 5 MG/5ML IJ SOLN
INTRAMUSCULAR | Status: DC | PRN
  Administered 2019-09-20: 2 mg via INTRAVENOUS

## 2019-09-20 MED ORDER — CHLORHEXIDINE GLUCONATE 0.12 % MT SOLN
15.0000 mL | Freq: Once | OROMUCOSAL | Status: AC
  Filled 2019-09-20: qty 15

## 2019-09-20 MED ORDER — PHENOL 1.4 % MT LIQD: 1.0000 | OROMUCOSAL | Status: DC | PRN

## 2019-09-20 MED ORDER — LIDOCAINE 2% (20 MG/ML) 5 ML SYRINGE
INTRAMUSCULAR | Status: DC | PRN
  Administered 2019-09-20: 60 mg via INTRAVENOUS

## 2019-09-20 MED ORDER — DEXAMETHASONE SODIUM PHOSPHATE 10 MG/ML IJ SOLN
INTRAMUSCULAR | Status: DC | PRN
  Administered 2019-09-20: 4 mg via INTRAVENOUS

## 2019-09-20 MED ORDER — ONDANSETRON HCL 4 MG PO TABS: 4.0000 mg | ORAL_TABLET | Freq: Four times a day (QID) | ORAL | Status: DC | PRN

## 2019-09-20 MED ORDER — PHENYLEPHRINE 40 MCG/ML (10ML) SYRINGE FOR IV PUSH (FOR BLOOD PRESSURE SUPPORT)
PREFILLED_SYRINGE | INTRAVENOUS | Status: DC | PRN
  Administered 2019-09-20: 80 ug via INTRAVENOUS

## 2019-09-20 MED ORDER — ONDANSETRON HCL 4 MG/2ML IJ SOLN
INTRAMUSCULAR | Status: DC | PRN
  Administered 2019-09-20: 4 mg via INTRAVENOUS

## 2019-09-20 MED ORDER — THROMBIN 5000 UNITS EX SOLR
OROMUCOSAL | Status: DC | PRN
  Administered 2019-09-20: 5 mL via TOPICAL

## 2019-09-20 SURGICAL SUPPLY — 52 items
ADH SKN CLS APL DERMABOND .7 (GAUZE/BANDAGES/DRESSINGS) ×1
BAG DECANTER FOR FLEXI CONT (MISCELLANEOUS) ×2 IMPLANT
BAND INSRT 18 STRL LF DISP RB (MISCELLANEOUS) ×2
BAND RUBBER #18 3X1/16 STRL (MISCELLANEOUS) ×4 IMPLANT
BLADE CLIPPER SURG (BLADE) IMPLANT
BUR ACORN 6.0 (BURR) IMPLANT
BUR MATCHSTICK NEURO 3.0 LAGG (BURR) ×2 IMPLANT
CANISTER SUCT 3000ML PPV (MISCELLANEOUS) ×2 IMPLANT
COVER WAND RF STERILE (DRAPES) ×2 IMPLANT
DECANTER SPIKE VIAL GLASS SM (MISCELLANEOUS) ×2 IMPLANT
DERMABOND ADVANCED (GAUZE/BANDAGES/DRESSINGS) ×1
DERMABOND ADVANCED .7 DNX12 (GAUZE/BANDAGES/DRESSINGS) ×1 IMPLANT
DEVICE DISSECT PLASMABLAD 3.0S (MISCELLANEOUS) IMPLANT
DRAPE HALF SHEET 40X57 (DRAPES) ×2 IMPLANT
DRAPE LAPAROTOMY 100X72X124 (DRAPES) ×2 IMPLANT
DRAPE MICROSCOPE LEICA (MISCELLANEOUS) ×1 IMPLANT
DURAPREP 26ML APPLICATOR (WOUND CARE) ×2 IMPLANT
ELECT REM PT RETURN 9FT ADLT (ELECTROSURGICAL) ×2
ELECTRODE REM PT RTRN 9FT ADLT (ELECTROSURGICAL) ×1 IMPLANT
GAUZE 4X4 16PLY RFD (DISPOSABLE) IMPLANT
GAUZE SPONGE 4X4 12PLY STRL (GAUZE/BANDAGES/DRESSINGS) IMPLANT
GLOVE BIO SURGEON STRL SZ 6.5 (GLOVE) ×6 IMPLANT
GLOVE BIOGEL PI IND STRL 6.5 (GLOVE) IMPLANT
GLOVE BIOGEL PI IND STRL 8.5 (GLOVE) ×1 IMPLANT
GLOVE BIOGEL PI INDICATOR 6.5 (GLOVE) ×3
GLOVE BIOGEL PI INDICATOR 8.5 (GLOVE) ×1
GLOVE ECLIPSE 8.5 STRL (GLOVE) ×2 IMPLANT
GOWN STRL REUS W/ TWL LRG LVL3 (GOWN DISPOSABLE) IMPLANT
GOWN STRL REUS W/ TWL XL LVL3 (GOWN DISPOSABLE) IMPLANT
GOWN STRL REUS W/TWL 2XL LVL3 (GOWN DISPOSABLE) ×2 IMPLANT
GOWN STRL REUS W/TWL LRG LVL3 (GOWN DISPOSABLE) ×6
GOWN STRL REUS W/TWL XL LVL3 (GOWN DISPOSABLE)
HEMOSTAT POWDER KIT SURGIFOAM (HEMOSTASIS) ×2 IMPLANT
KIT BASIN OR (CUSTOM PROCEDURE TRAY) ×2 IMPLANT
KIT TURNOVER KIT B (KITS) ×2 IMPLANT
NDL SPNL 20GX3.5 QUINCKE YW (NEEDLE) IMPLANT
NEEDLE HYPO 22GX1.5 SAFETY (NEEDLE) ×2 IMPLANT
NEEDLE SPNL 20GX3.5 QUINCKE YW (NEEDLE) ×2 IMPLANT
NS IRRIG 1000ML POUR BTL (IV SOLUTION) ×2 IMPLANT
PACK LAMINECTOMY NEURO (CUSTOM PROCEDURE TRAY) ×2 IMPLANT
PAD ARMBOARD 7.5X6 YLW CONV (MISCELLANEOUS) ×6 IMPLANT
PATTIES SURGICAL .5 X1 (DISPOSABLE) ×2 IMPLANT
PLASMABLADE 3.0S (MISCELLANEOUS) ×2
SPONGE SURGIFOAM ABS GEL SZ50 (HEMOSTASIS) ×1 IMPLANT
SUT VIC AB 1 CT1 18XBRD ANBCTR (SUTURE) ×1 IMPLANT
SUT VIC AB 1 CT1 8-18 (SUTURE) ×2
SUT VIC AB 2-0 CP2 18 (SUTURE) ×2 IMPLANT
SUT VIC AB 3-0 SH 8-18 (SUTURE) ×2 IMPLANT
SUT VIC AB 4-0 RB1 18 (SUTURE) ×2 IMPLANT
TOWEL GREEN STERILE (TOWEL DISPOSABLE) ×2 IMPLANT
TOWEL GREEN STERILE FF (TOWEL DISPOSABLE) ×2 IMPLANT
WATER STERILE IRR 1000ML POUR (IV SOLUTION) ×2 IMPLANT

## 2019-09-20 NOTE — Evaluation (Addendum)
I agree with the following treatment note after review of the documentation. This session was performed under the supervision of a licensed clinician.   Lou Miner, DPT  Acute Rehabilitation Services  Pager: [phone]  Physical Therapy Evaluation & Discharge Patient Details Name: Christopher Booth MRN: [id number] DOB: Mar 17, 1940 Today's Date: 09/20/2019   History of Present Illness  pt is a 79 yo male who is s/p L4-S1 laminectomy, foraminatomy, and decompression surgery. Pt has a PMH of adrenal mass, CAD, HTN, R TKA, and a MI in 2021  Clinical Impression  Pt was evaluated and assessed for the above diagnosis and impairments below. Pt required supervision to min guard assist with all mobility tasks. He was educated spinal precautions, walking program, and how to maintain precautions during ADLs.. Pt lives at home with wife who can provide assist 24/7. Feel pt would benefit from outpatient PT once cleared by MD. Feel pt with no further acute skilled PT needs. Will sign off on this patient. If needs change, please reconsult.     Follow Up Recommendations No PT follow up (would benefit from outpatient PT once cleared by MD)    Equipment Recommendations  None recommended by PT    Recommendations for Other Services       Precautions / Restrictions Precautions Precautions: Back Precaution Booklet Issued: Yes (comment) Precaution Comments: pt educated on spinal precautions Restrictions Weight Bearing Restrictions: No      Mobility  Bed Mobility Overal bed mobility: Needs Assistance Bed Mobility: Rolling;Sidelying to Sit;Sit to Sidelying Rolling: Supervision Sidelying to sit: Supervision     Sit to sidelying: Supervision General bed mobility comments: pt required supervision for safety to ensure spinal precuations. Pt required verbal cues for log roll technique  Transfers Overall transfer level: Needs assistance Equipment used: None Transfers: Sit to/from Stand Sit to Stand:  Supervision         General transfer comment: pt required supervision for safety with sit<>stand  Ambulation/Gait Ambulation/Gait assistance: Min guard Gait Distance (Feet): 200 Feet Assistive device: None Gait Pattern/deviations: Step-through pattern;Decreased dorsiflexion - right;Decreased stance time - right;Decreased step length - right (ER at R hip) Gait velocity: decreased   General Gait Details: pt was noted to be slightly slow with gait.  Pt was noted to ambulate with R hip in ER and was cued for proper gait mechanics. Pt had slight increase in pain with change in gait mechanics and returned to original gait pattern  Stairs Stairs: Yes Stairs assistance: Min guard Stair Management: One rail Right;Step to pattern Number of Stairs: 10 General stair comments: pt required min guard assist for safety with stair navigation. Pt was noted to use a step to pattern for stair navigation for safety due to RLE weakness  Wheelchair Mobility    Modified Rankin (Stroke Patients Only)       Balance Overall balance assessment: Needs assistance Sitting-balance support: Feet supported;No upper extremity supported Sitting balance-Leahy Scale: Good     Standing balance support: No upper extremity supported;During functional activity Standing balance-Leahy Scale: Fair Standing balance comment: pt able to perform dynamic tasks without UE or external support               Pertinent Vitals/Pain Pain Assessment: Faces Faces Pain Scale: Hurts a little bit Pain Location: R hip and low back  Pain Descriptors / Indicators: Operative site guarding Pain Intervention(s): Limited activity within patient's tolerance;Monitored during session;Repositioned    Home Living Family/patient expects to be discharged to:: Private residence Living Arrangements:  Spouse/significant other Available Help at Discharge: Family;Available 24 hours/day Type of Home: House Home Access: Stairs to  enter Entrance Stairs-Rails: Can reach both Entrance Stairs-Number of Steps: 7 Home Layout: Two level;Able to live on main level with bedroom/bathroom Home Equipment: Gilford Rile - 2 wheels;Cane - single point;Shower seat - built in;Grab bars - toilet;Wheelchair - manual      Prior Function Level of Independence: Independent               Hand Dominance        Extremity/Trunk Assessment   Upper Extremity Assessment Upper Extremity Assessment: Overall WFL for tasks assessed    Lower Extremity Assessment Lower Extremity Assessment: RLE deficits/detail RLE Deficits / Details: pt came in with dropfoot and quad weakness prior to operation. Noted to be improved since surgery     Cervical / Trunk Assessment Cervical / Trunk Assessment: Other exceptions (s/p lumbar surgery)  Communication   Communication: No difficulties  Cognition Arousal/Alertness: Awake/alert Behavior During Therapy: WFL for tasks assessed/performed Overall Cognitive Status: Within Functional Limits for tasks assessed                 General Comments General comments (skin integrity, edema, etc.): pt was educated on general walking program and car transfers. Also educated about how to perform LE dressing    Exercises     Assessment/Plan    PT Assessment Patent does not need any further PT services  PT Problem List         PT Treatment Interventions      PT Goals (Current goals can be found in the Care Plan section)  Acute Rehab PT Goals Patient Stated Goal: get home today PT Goal Formulation: With patient Time For Goal Achievement: 09/20/19 Potential to Achieve Goals: Good    Frequency     Barriers to discharge        Co-evaluation               AM-PAC PT "6 Clicks" Mobility  Outcome Measure Help needed turning from your back to your side while in a flat bed without using bedrails?: None Help needed moving from lying on your back to sitting on the side of a flat bed without  using bedrails?: None Help needed moving to and from a bed to a chair (including a wheelchair)?: None Help needed standing up from a chair using your arms (e.g., wheelchair or bedside chair)?: None Help needed to walk in hospital room?: A Little Help needed climbing 3-5 steps with a railing? : A Little 6 Click Score: 22    End of Session Equipment Utilized During Treatment: Gait belt Activity Tolerance: Patient tolerated treatment well Patient left: in bed;with call bell/phone within reach Nurse Communication: Mobility status PT Visit Diagnosis: Other abnormalities of gait and mobility (R26.89)    Time: 1431-1500 PT Time Calculation (min) (ACUTE ONLY): 29 min   Charges:   PT Evaluation $PT Eval Low Complexity: 1 Low PT Treatments $Gait Training: 8-22 mins       Gloriann Loan, SPT  [place]  Office: [phone]  09/20/2019, 3:45 PM

## 2019-09-20 NOTE — Anesthesia Procedure Notes (Signed)
Procedure Name: Intubation Date/Time: 09/20/2019 7:52 AM Performed by: Amadeo Garnet, CRNA Pre-anesthesia Checklist: Patient identified, Emergency Drugs available, Suction available and Patient being monitored Patient Re-evaluated:Patient Re-evaluated prior to induction Oxygen Delivery Method: Circle system utilized Preoxygenation: Pre-oxygenation with 100% oxygen Induction Type: IV induction Ventilation: Mask ventilation without difficulty Laryngoscope Size: Mac and 4 Grade View: Grade II Tube type: Oral Tube size: 7.5 mm Number of attempts: 1 Airway Equipment and Method: Stylet Placement Confirmation: ETT inserted through vocal cords under direct vision,  positive ETCO2 and breath sounds checked- equal and bilateral Secured at: 22 cm Tube secured with: Tape Dental Injury: Teeth and Oropharynx as per pre-operative assessment

## 2019-09-20 NOTE — Anesthesia Postprocedure Evaluation (Signed)
Anesthesia Post Note  Patient: OCEAN SCHILDT  Procedure(s) Performed: Right Lumbar four- Lumbar five Lumbar five Sacral one Laminectomy/Foraminotomy (Right Back)     Patient location during evaluation: PACU Anesthesia Type: General Level of consciousness: awake and alert Pain management: pain level controlled Vital Signs Assessment: post-procedure vital signs reviewed and stable Respiratory status: spontaneous breathing, nonlabored ventilation and respiratory function stable Cardiovascular status: blood pressure returned to baseline and stable Postop Assessment: no apparent nausea or vomiting Anesthetic complications: no   No complications documented.  Last Vitals:  Vitals:   09/20/19 1100 09/20/19 1115  BP: (!) 145/74 (!) 142/72  Pulse: 65 67  Resp: 20 12  Temp:  36.5 C  SpO2: 96% 100%    Last Pain:  Vitals:   09/20/19 1100  TempSrc:   PainSc: 0-No pain                 Zaryah Seckel,W. EDMOND

## 2019-09-20 NOTE — Hospital Course (Signed)
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 Postoperative diagnosis: Same Procedure: Right-sided laminotomy and foraminotomies L4-5, decompression of the L5 nerve root and the common dural tube right sided laminotomy and foraminotomies L5-S1 with discectomy and decompression of S1 nerve root Surgeon: Kristeen Miss, MD First Assistant: Osie Cheeks NP Anesthesia: General endotracheal Indications: Christopher Booth is a 79 year old individual who developed painless weakness of the right lower extremity.  Is found to have severe spondylitic disease at L4-5 and L5-S1 with lateral recess stenosis involving the L4-5 and the S1 nerve roots the patient was advised regarding surgical decompression having noted progression of the symptoms over the past number of months.  Procedure: Patient was brought to the operating room supine on the stretcher.  After the smooth induction of general endotracheal anesthesia, he was carefully turned prone.  The back was prepped with alcohol DuraPrep and draped in a sterile fashion.  X-ray localization of the L4-5 and L5-S1 segments was obtained and a small vertical incision was created in this region.  Lumbodorsal fascia was opened on the right side and a subperiosteal dissection was performed to expose the L4-5 and L5-S1 spaces.  Half percent Marcaine mixed 50-50 with 1% lidocaine with epinephrine was injected into the subcutaneous tissues and the paraspinous fascia was at the start of the case.  Once the dissection was performed laminotomies were created L4-5 and L5S1 using high-speed drill and loupe magnification.  Once the thickened redundant ligament was stripped from this region the common dural tube and the takeoff the L5 nerve root was exposed inferiorly and the common dural tube was exposed superiorly microscope was draped and brought into the field and the rest of dissection was  performed under microscope magnification.  At L4-5 there was noted to be a significant stenosis of the L5 nerve root mostly from thickened redundant ligament superiorly there is a hard calcified disc in this region on the right side which did not yield to minimal palpation.  The common dural tube and the L5 nerve root was decompressed dorsally and laterally but the disc itself was not incised that is it was very calcific.  The L5-S1 space was then exposed and the S1 nerve root was noted to be bowed dorsally over significant soft tissue mass this was found to be a soft disc herniation with calcified components to it the disc was incised and several fragments were removed but ultimately a discectomy was performed removing a substantial quantity of severely degenerated desiccated disc material from the disc space at L5-S1 was done with a combination of curettes and rongeurs and dissectors in the end there was a significantly improved decompression of the S1 nerve root as across the disc space and there is a large opening into the disc space where the disc material was evacuated hemostasis around this area was obtained meticulously the area was infiltrated with half percent Marcaine and once a good decompression was noted both at L5-S1 and at L4-L5 the microscope was removed the retractor was removed and the lumbodorsal fascia was closed with #1 Vicryl in interrupted fashion 2-0 Vicryl was used in the subcutaneous tissues and 3-0 Vicryl subcuticularly.  A total of 30 cc of half percent Marcaine was injected into the paraspinous wound and fascia.  The patient tolerated procedure well blood loss was estimated at 100 cc he is returned to recovery room in stable condition.

## 2019-09-20 NOTE — Transfer of Care (Signed)
Immediate Anesthesia Transfer of Care Note  Patient: Christopher Booth  Procedure(s) Performed: Right Lumbar four- Lumbar five Lumbar five Sacral one Laminectomy/Foraminotomy (Right Back)  Patient Location: PACU  Anesthesia Type:General  Level of Consciousness: awake, alert  and oriented  Airway & Oxygen Therapy: Patient Spontanous Breathing  Post-op Assessment: Report given to RN, Post -op Vital signs reviewed and stable and Patient moving all extremities  Post vital signs: Reviewed and stable  Last Vitals:  Vitals Value Taken Time  BP 140/81 09/20/19 1012  Temp    Pulse 59 09/20/19 1014  Resp 20 09/20/19 1014  SpO2 100 % 09/20/19 1014  Vitals shown include unvalidated device data.  Last Pain:  Vitals:   09/20/19 0643  TempSrc:   PainSc: 2       Patients Stated Pain Goal: 1 (21/82/88 3374)  Complications: No complications documented.

## 2019-09-20 NOTE — H&P (Addendum)
Christopher Booth is an 79 y.o. male.    Chief Complaint: difficulty with weakness of his right foot  HPI: He has significant footdrop on the right leg and also noted that he had some proximal leg weakness in the quadriceps in addition to the tibialis anterior group. He noted that there is discomfort upon ambulation, he feels like he is tripping in his right foot and he feels that he needs to drag the right leg when he walks. This has been progressively worsening over a month period of time. 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 He has not had any bowel or bladder symptoms.  Past Medical History:  Diagnosis Date  . 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 Arthritis   . 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 Essential hypertension   . Gynecomastia   . Hyperaldosteronism   . Hypertension    Phreesia 07/21/2019  . Lumbar radiculopathy   . Myocardial infarction ([place])    Phreesia 07/21/2019  . Pneumonia   . Postoperative atrial fibrillation ([place])   . Pre-diabetes   . Spinal stenosis   . Temporomandibular joint disease   . Wears glasses     Past Surgical History:  Procedure Laterality Date  . CATARACT EXTRACTION W/PHACO Right 01/28/2015   Procedure: CATARACT EXTRACTION PHACO AND INTRAOCULAR LENS PLACEMENT RIGHT EYE;  Surgeon: Williams Che, MD;  Location: [facility];  Service: Ophthalmology;  Laterality: Right;  CDE:27.23  . CATARACT EXTRACTION W/PHACO Left 04/14/2015   Procedure: CATARACT EXTRACTION PHACO AND INTRAOCULAR LENS PLACEMENT LEFT EYE CDE=15.86;  Surgeon: Williams Che, MD;  Location: [facility];  Service: Ophthalmology;  Laterality: Left;  . CERVICAL FUSION  1993  . CHOLECYSTECTOMY  1995  . COLONOSCOPY    . CORONARY ARTERY BYPASS GRAFT N/A 01/12/2017   Procedure: CORONARY ARTERY BYPASS GRAFTING (CABG);  Surgeon: Grace Isaac, MD;  Location: [facility];  Service: Open Heart Surgery;  Laterality: N/A;  Times 3 using left internal mammary  artery to LAD and endoscopically harvested right thigh saphenous vein to OM and PD  . EYE SURGERY N/A    Phreesia 07/21/2019  . KNEE ARTHROSCOPY Right 10/09/2013   Procedure: RIGHT ARTHROSCOPY KNEE Partial medial and lateral meniscal tear and chondroplasty;  Surgeon: Hessie Dibble, MD;  Location: [facility];  Service: Orthopedics;  Laterality: Right;  Partial medial and lateral meniscal tear and chondroplasty   . LEFT HEART CATH AND CORONARY ANGIOGRAPHY N/A 01/11/2017   Procedure: LEFT HEART CATH AND CORONARY ANGIOGRAPHY;  Surgeon: Jettie Booze, MD;  Location: [facility];  Service: Cardiovascular;  Laterality: N/A;  . TEE WITHOUT CARDIOVERSION N/A 01/12/2017   Procedure: TRANSESOPHAGEAL ECHOCARDIOGRAM (TEE);  Surgeon: Grace Isaac, MD;  Location: [facility];  Service: Open Heart Surgery;  Laterality: N/A;  . TONSILLECTOMY    . VASECTOMY N/A    Phreesia 07/21/2019    Family History  Problem Relation Age of Onset  . Hypertension Other   . Cancer Brother        Colon  . Cancer Brother        Melanoma   Social History:  reports that he has never smoked. He has never used smokeless tobacco. He reports current alcohol use. He reports that he does not use drugs.  Allergies:  Allergies  Allergen Reactions  . Diovan [Valsartan]     unknown    Medications Prior to Admission  Medication Sig Dispense Refill  . acetaminophen (TYLENOL) 500 MG tablet Take 500 mg by mouth every 6 (six) hours as needed for mild pain.     Marland Kitchen aspirin EC 81 MG EC tablet Take 1 tablet (81 mg total) by mouth daily.    Marland Kitchen atorvastatin (LIPITOR) 20 MG tablet Take 1 tablet (20 mg total) by mouth daily. 90 tablet 1  . carvedilol (COREG) 6.25 MG tablet Take 1 tablet (6.25 mg total) by mouth 2 (two) times daily. (Patient taking differently: Take 12.5 mg by mouth 2 (two) times daily. ) 180 tablet 3  . eplerenone (INSPRA) 50 MG tablet Take 50 mg by mouth 2 (two) times daily.     . Multiple  Vitamins-Minerals (MULTI COMPLETE PO) Take by mouth daily.    . potassium chloride (KLOR-CON) 10 MEQ tablet Take 10 mEq by mouth daily.    Marland Kitchen telmisartan (MICARDIS) 80 MG tablet Take 80 mg by mouth at bedtime.      Results for orders placed or performed during the hospital encounter of 09/20/19 (from the past 48 hour(s))  Basic metabolic panel per protocol     Status: Abnormal   Collection Time: 09/20/19  6:04 AM  Result Value Ref Range   Sodium 138 135 - 145 mmol/L   Potassium 3.4 (L) 3.5 - 5.1 mmol/L   Chloride 98 98 - 111 mmol/L   CO2 30 22 - 32 mmol/L   Glucose, Bld 151 (H) 70 - 99 mg/dL    Comment: Glucose reference range applies only to samples taken after fasting for at least 8 hours.   BUN 17 8 - 23 mg/dL   Creatinine, Ser 1.11 0.61 - 1.24 mg/dL   Calcium 9.4 8.9 - 10.3 mg/dL   GFR calc non Af Amer >60 >60 mL/min   GFR calc Af Amer >60 >60 mL/min   Anion gap 10 5 - 15    Comment: Performed at [facility] [address] [place], [place] [address]  CBC per protocol     Status: None   Collection Time: 09/20/19  6:04 AM  Result Value Ref Range   WBC 10.3 4.0 - 10.5 K/uL   RBC 5.15 4.22 - 5.81 MIL/uL   Hemoglobin 16.0 13.0 - 17.0 g/dL   HCT 48.0 39 - 52 %   MCV 93.2 80.0 - 100.0 fL   MCH 31.1 26.0 - 34.0 pg   MCHC 33.3 30.0 - 36.0 g/dL   RDW 12.4 11.5 - 15.5 %   Platelets 235 150 - 400 K/uL   nRBC 0.0 0.0 - 0.2 %    Comment: Performed at [facility], [place] [address] [place], [place] [address]   No results found.   MR LUMBAR SPINE WO CONTRAST  Result Date: 09/14/2019 CLINICAL DATA:  Foot drop on the right. EXAM: MRI LUMBAR SPINE WITHOUT CONTRAST TECHNIQUE: Multiplanar, multisequence MR imaging of the lumbar spine was performed. No intravenous contrast was administered. COMPARISON:  Lumbar spine MRI 01/10/2008 FINDINGS: Segmentation: There are five lumbar type vertebral bodies. The last full intervertebral disc space is labeled L5-S1. Small remnant disc space  at S1-2. This correlates with the prior MR examination. Alignment:  Normal Vertebrae: Normal marrow signal. No bone lesions or fractures. Mild endplate reactive changes at L4-5 and a few small scattered hemangiomas. Conus medullaris and cauda equina: Conus extends to the T12-L1 level. Conus and cauda equina appear normal. Paraspinal and other soft tissues: Right adrenal gland mass is noted. It measures  3.8 x 3.5 cm and was recently imaged with an MRI abdomen 08/01/2019. Disc levels: L1-2: No significant findings.  Mild facet disease is stable. L2-3: Progressive degenerative disc disease with a diffuse bulging degenerated annulus and osteophytic ridging. This in conjunction with facet disease and mild ligamentum flavum thickening creates mild spinal stenosis and moderate bilateral lateral recess stenosis, right greater than left. No significant foraminal stenosis. Findings progressive when compared to the prior study. L3-4: Bulging annulus, osteophytic ridging and moderate facet disease but no significant spinal or foraminal stenosis. Mild bilateral lateral recess encroachment. L4-5: Progressive degenerative disc disease with a diffuse bulging annulus and osteophytic ridging. There is also a focal central disc protrusion which is slightly progressive. Facet disease is also progressive and the combination creates moderately severe spinal and bilateral lateral recess stenosis. No significant foraminal stenosis. L5-S1: Bulging annulus, osteophytic ridging, persistent central disc protrusion and significant facet disease contributing to stable severe spinal and bilateral lateral recess stenosis. No significant foraminal stenosis. IMPRESSION: 1. Progressive degenerative lumbar spondylosis with multilevel disc disease and facet disease. 2. Mild spinal stenosis and moderate bilateral lateral recess stenosis, right greater than left at L2-3. 3. Moderately severe spinal and bilateral lateral recess stenosis at L4-5,  progressive since prior study. 4. Stable severe spinal and bilateral lateral recess stenosis at L5-S1. Electronically Signed   By: Marijo Sanes M.D.   On: 09/14/2019 09:01    Review of Systems  Constitutional: Negative.   HENT: Negative.   Respiratory: Negative.   Cardiovascular: Negative.   Musculoskeletal:       Right leg weakness  Skin: Negative.   Neurological: Negative.     Blood pressure (!) 166/99, pulse 65, temperature 98.1 F (36.7 C), temperature source Oral, resp. rate 17, height 5' 6.5" (1.689 m), weight 73.5 kg, SpO2 100 %. Physical Exam Constitutional:      Appearance: Normal appearance.  HENT:     Head: Normocephalic and atraumatic.  Cardiovascular:     Rate and Rhythm: Normal rate.  Pulmonary:     Effort: Pulmonary effort is normal.  Musculoskeletal:     Comments: Weakness in the iliopsoas on the right side  Neurological:     General: No focal deficit present.     Mental Status: He is alert and oriented to person, place, and time.      Assessment/Plan Mr. Schlabach is seen in followup after an initial visit with Dr. Erline Levine.  Dr. Vertell Limber noted Mr. Loya has been having difficulty with weakness in his right foot.  He has a significant footdrop on the right leg and it is also noted that he had some proximal leg weakness in the quadriceps in addition to the tibialis anterior group.  I have been asked to see Mr. Hjort having had his MRI completed at the end of last week.  Dr. Vertell Limber is out of the office for the next several weeks.  The patient notes that he has largely had painless weakness develop in that right leg.  He notes that there is some discomfort at night in his leg as though he had been walking hard, but overall, the biggest problem is that he feels he is tripping on his right foot and he feels that he staggers as though he needs to drag his right leg when he walks.  This has been getting progressively worse for over a month period of time.  Back in 2009,  the patient was seen by Dr. Sherwood Gambler for some back issues and at  that time, an MRI demonstrated a significant high-grade stenosis at L5-S1, but clinically, the patient was not having any significant pain symptoms or weakness, and Dr. Sherwood Gambler sent him on his way.  He noted that once the pain gets bad or the weakness should develop, he should contact him again.  The current MRI was compared to that 2009 study and it demonstrates that the patient has degenerative changes at L5-S1 and at L4-L5.  The radiologist's report suggests that the changes at L5-S1 are stable.  However, on my review, I believe the patient has significantly more hypertrophy of the posterior elements at L5-S1, particularly off to the right side compared to his 2009 study.  At I2-L7, he has certainly progressed a severe central canal stenosis and a right lateral recess stenosis due to hypertrophy of the posterior elements and possibly some small cystic formation in the region of the facet itself.  Certainly, he has severe stenosis at both these levels.  Plain x-rays that Dr. Vertell Limber had performed on his evaluation demonstrate that the patient has transitional anatomy with an extra disc space in the sacrum that is not completely formed.  L4-5 and L5-S1 show some advanced degenerative changes, but there is no evidence of a listhesis between flexion and extension.  The alignment is good in the coronal plane also.  IMPRESSION: I noted to the patient today that on my exam, he appears to also have some weakness in the iliopsoas on that right side.  This certainly would portend that there are some issues as high as the L4 region, and the L4 nerve root does have some lateral recess stenosis and I have advised that given the fact that he has not had significant back pain and does not have any left-sided symptoms, we should simply concentrate on decompressing the right side at L4-5 and at L5-S1.  This would require a laminotomy with a significant facetectomy  to open the path for the L4 nerve root superiorly, the L5 nerve root inferiorly, and also the S1 nerve root at the L5-S1 junction.  This is typically planned as an outpatient procedure.  The patient does have a history of an adrenal tumor and he has surgery planned for this in the coming month.  However, given the progression that he has been experiencing with weakness in the right leg, I believe that an early decompression of the lumbar spine would be to his benefit.  He has not had any bowel or bladder symptoms.  The patient noted to [place] that he is the primary caregiver for his wife who has had a substantial stroke with right-sided weakness and he would prefer certainly not to have an overnight stay.  I suggested that we can do the surgery at the hospital so we can have an appropriate backup from an anesthetic standpoint, but we should be able to handle this on an outpatient basis.  The caveats remain that the stenosis is noted to be bilateral, albeit his symptoms are unilateral.  I believe that decompressing unilaterally, saves him the potential for developing instability at the joints involved, and this would be to his benefit.  We will plan to schedule surgery at his convenience.   Osie Cheeks, NP 09/20/2019, 7:35 AM

## 2019-09-20 NOTE — Progress Notes (Signed)
OT Cancellation Note  Patient Details Name: Christopher Booth MRN: [id number] DOB: 08/16/1940   Cancelled Treatment:    Reason Eval/Treat Not Completed: OT screened, no needs identified, will sign off. Per PT, pt does not require OT.   Malka So 09/20/2019, 3:16 PM  Nestor Lewandowsky, OTR/L Acute Rehabilitation Services Pager: [phone] Office: [phone]

## 2019-09-20 NOTE — Plan of Care (Signed)
Pt doing well. Pt given D/C instructions with verba l understanding. Rx's were e-prescribed to the Pt's pharmacy. Pt's incision is clean and dry with no sign of infection. Pt's IV was removed prior to D/C. Pt D/C'd home via wheelchair per MD order. Pt is stable @ D/C and has no other needs at this time. Holli Humbles, RN

## 2019-09-20 NOTE — Op Note (Signed)
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 Postoperative diagnosis: Same Procedure: Right-sided laminotomy and foraminotomies L4-5, decompression of the L5 nerve root and the common dural tube right sided laminotomy and foraminotomies L5-S1 with discectomy and decompression of S1 nerve root Surgeon: Kristeen Miss, MD First Assistant: Osie Cheeks NP Anesthesia: General endotracheal Indications: Christopher Booth is a 79 year old individual who developed painless weakness of the right lower extremity.  Is found to have severe spondylitic disease at L4-5 and L5-S1 with lateral recess stenosis involving the L4-5 and the S1 nerve roots the patient was advised regarding surgical decompression having noted progression of the symptoms over the past number of months.  Procedure: Patient was brought to the operating room supine on the stretcher.  After the smooth induction of general endotracheal anesthesia, he was carefully turned prone.  The back was prepped with alcohol DuraPrep and draped in a sterile fashion.  X-ray localization of the L4-5 and L5-S1 segments was obtained and a small vertical incision was created in this region.  Lumbodorsal fascia was opened on the right side and a subperiosteal dissection was performed to expose the L4-5 and L5-S1 spaces.  Half percent Marcaine mixed 50-50 with 1% lidocaine with epinephrine was injected into the subcutaneous tissues and the paraspinous fascia was at the start of the case.  Once the dissection was performed laminotomies were created L4-5 and L5S1 using high-speed drill and loupe magnification.  Once the thickened redundant ligament was stripped from this region the common dural tube and the takeoff the L5 nerve root was exposed inferiorly and the common dural tube was exposed superiorly microscope was draped and brought into the field and the rest of dissection was  performed under microscope magnification.  At L4-5 there was noted to be a significant stenosis of the L5 nerve root mostly from thickened redundant ligament superiorly there is a hard calcified disc in this region on the right side which did not yield to minimal palpation.  The common dural tube and the L5 nerve root was decompressed dorsally and laterally but the disc itself was not incised that is it was very calcific.  The L5-S1 space was then exposed and the S1 nerve root was noted to be bowed dorsally over significant soft tissue mass this was found to be a soft disc herniation with calcified components to it the disc was incised and several fragments were removed but ultimately a discectomy was performed removing a substantial quantity of severely degenerated desiccated disc material from the disc space at L5-S1 was done with a combination of curettes and rongeurs and dissectors in the end there was a significantly improved decompression of the S1 nerve root as across the disc space and there is a large opening into the disc space where the disc material was evacuated hemostasis around this area was obtained meticulously the area was infiltrated with half percent Marcaine and once a good decompression was noted both at L5-S1 and at L4-L5 the microscope was removed the retractor was removed and the lumbodorsal fascia was closed with #1 Vicryl in interrupted fashion 2-0 Vicryl was used in the subcutaneous tissues and 3-0 Vicryl subcuticularly.  A total of 30 cc of half percent Marcaine was injected into the paraspinous wound and fascia.  The patient tolerated procedure well blood loss was estimated at 100 cc he is returned to recovery room in stable condition.

## 2019-09-20 NOTE — Discharge Summary (Signed)
°  Christopher Booth is a 79 years old male who has significant footdrop on the right leg and also noted that he had some proximal leg weakness in the quadriceps in addition to the tibialis anterior group.   Procedure: Right-sided laminotomy and foraminotomies L4-5, decompression of the L5 nerve root and the common dural tube right sided laminotomy and foraminotomies L5-S1 with discectomy and decompression of S1 nerve root  Pt is doing well. Alert and oriented x 4 PERRLA CN II-XII grossly intact MAE, Strength and sensation intact, RLE: 4/5, LLE: 5/5 Incision is covered dermabond; Dressing is clean, dry, and intact Patient is ready to go home. Pt is ambulating without any difficulty.   Plan: Discharge home today.

## 2019-09-21 ENCOUNTER — Encounter (HOSPITAL_COMMUNITY): Payer: Self-pay | Admitting: Neurological Surgery

## 2019-10-09 DIAGNOSIS — M545 Low back pain: Secondary | ICD-10-CM | POA: Diagnosis not present

## 2019-10-09 DIAGNOSIS — M5106 Intervertebral disc disorders with myelopathy, lumbar region: Secondary | ICD-10-CM | POA: Diagnosis not present

## 2019-10-09 DIAGNOSIS — M21379 Foot drop, unspecified foot: Secondary | ICD-10-CM | POA: Diagnosis not present

## 2019-10-11 NOTE — Progress Notes (Signed)
[place], [place] [address] [place] [address] Phone: [phone] Fax: [phone]      Your procedure is scheduled on September 22  Report to [facility] Main Entrance "A" at 0530 A.M., and check in at the Admitting office.  Call this number if you have problems the morning of surgery:  [phone]  Call [phone] if you have any questions prior to your surgery date Monday-Friday 8am-4pm    Remember:  Do not eat or drink after midnight the night before your surgery     Take these medicines the morning of surgery with A SIP OF WATER  carvedilol (COREG) methocarbamol (ROBAXIN) if needed oxyCODONE-acetaminophen (PERCOCET/ROXICET) if needed   As of today, STOP taking any Aspirin (unless otherwise instructed by your surgeon) Aleve, Naproxen, Ibuprofen, Motrin, Advil, Goody's, BC's, all herbal medications, fish oil, and all vitamins.                      Do not wear jewelry            Do not wear lotions, powders, colognes, or deodorant.             Men may shave face and neck.            Do not bring valuables to the hospital.            [facility] is not responsible for any belongings or valuables.  Do NOT Smoke (Tobacco/Vaping) or drink Alcohol 24 hours prior to your procedure If you use a CPAP at night, you may bring all equipment for your overnight stay.   Contacts, glasses, dentures or bridgework may not be worn into surgery.      For patients admitted to the hospital, discharge time will be determined by your treatment team.   Patients discharged the day of surgery will not be allowed to drive home, and someone needs to stay with them for 24 hours.    Special instructions:   [place]- Preparing For Surgery  Before surgery, you can play an important role. Because skin is not sterile, your skin needs to be as free of germs as possible. You can reduce the number of germs on your skin by washing with  CHG (chlorahexidine gluconate) Soap before surgery.  CHG is an antiseptic cleaner which kills germs and bonds with the skin to continue killing germs even after washing.    Oral Hygiene is also important to reduce your risk of infection.  Remember - BRUSH YOUR TEETH THE MORNING OF SURGERY WITH YOUR REGULAR TOOTHPASTE  Please do not use if you have an allergy to CHG or antibacterial soaps. If your skin becomes reddened/irritated stop using the CHG.  Do not shave (including legs and underarms) for at least 48 hours prior to first CHG shower. It is OK to shave your face.  Please follow these instructions carefully.   1. Shower the NIGHT BEFORE SURGERY and the MORNING OF SURGERY with CHG Soap.   2. If you chose to wash your hair, wash your hair first as usual with your normal shampoo.  3. After you shampoo, rinse your hair and body thoroughly to remove the shampoo.  4. Use CHG as you would any other liquid soap. You can apply CHG directly to the skin and wash gently with a scrungie or a clean washcloth.   5. Apply the CHG Soap to your body ONLY FROM THE NECK DOWN.  Do  not use on open wounds or open sores. Avoid contact with your eyes, ears, mouth and genitals (private parts). Wash Face and genitals (private parts)  with your normal soap.   6. Wash thoroughly, paying special attention to the area where your surgery will be performed.  7. Thoroughly rinse your body with warm water from the neck down.  8. DO NOT shower/wash with your normal soap after using and rinsing off the CHG Soap.  9. Pat yourself dry with a CLEAN TOWEL.  10. Wear CLEAN PAJAMAS to bed the night before surgery  11. Place CLEAN SHEETS on your bed the night of your first shower and DO NOT SLEEP WITH PETS.   Day of Surgery: Wear Clean/Comfortable clothing the morning of surgery Do not apply any deodorants/lotions.   Remember to brush your teeth WITH YOUR REGULAR TOOTHPASTE.   Please read over the following fact sheets  that you were given.

## 2019-10-12 ENCOUNTER — Other Ambulatory Visit: Payer: Self-pay

## 2019-10-12 ENCOUNTER — Encounter (HOSPITAL_COMMUNITY): Payer: Self-pay

## 2019-10-12 ENCOUNTER — Encounter (HOSPITAL_COMMUNITY)
Admission: RE | Admit: 2019-10-12 | Discharge: 2019-10-12 | Disposition: A | Discharge status: Home / Self Care | Payer: PPO | Source: Ambulatory Visit | Attending: General Surgery | Admitting: General Surgery

## 2019-10-12 DIAGNOSIS — M21379 Foot drop, unspecified foot: Secondary | ICD-10-CM | POA: Diagnosis not present

## 2019-10-12 DIAGNOSIS — R7303 Prediabetes: Secondary | ICD-10-CM | POA: Insufficient documentation

## 2019-10-12 DIAGNOSIS — E279 Disorder of adrenal gland, unspecified: Secondary | ICD-10-CM | POA: Insufficient documentation

## 2019-10-12 DIAGNOSIS — I2510 Atherosclerotic heart disease of native coronary artery without angina pectoris: Secondary | ICD-10-CM | POA: Diagnosis not present

## 2019-10-12 DIAGNOSIS — I10 Essential (primary) hypertension: Secondary | ICD-10-CM | POA: Insufficient documentation

## 2019-10-12 DIAGNOSIS — Z951 Presence of aortocoronary bypass graft: Secondary | ICD-10-CM | POA: Insufficient documentation

## 2019-10-12 DIAGNOSIS — Z79899 Other long term (current) drug therapy: Secondary | ICD-10-CM | POA: Insufficient documentation

## 2019-10-12 DIAGNOSIS — Z01812 Encounter for preprocedural laboratory examination: Secondary | ICD-10-CM | POA: Diagnosis not present

## 2019-10-12 DIAGNOSIS — M5106 Intervertebral disc disorders with myelopathy, lumbar region: Secondary | ICD-10-CM | POA: Diagnosis not present

## 2019-10-12 DIAGNOSIS — I252 Old myocardial infarction: Secondary | ICD-10-CM | POA: Insufficient documentation

## 2019-10-12 DIAGNOSIS — M545 Low back pain: Secondary | ICD-10-CM | POA: Diagnosis not present

## 2019-10-12 HISTORY — DX: Cardiac arrhythmia, unspecified: I49.9

## 2019-10-12 LAB — BASIC METABOLIC PANEL
Anion gap: 9 (ref 5–15)
BUN: 19 mg/dL (ref 8–23)
CO2: 29 mmol/L (ref 22–32)
Calcium: 9.5 mg/dL (ref 8.9–10.3)
Chloride: 101 mmol/L (ref 98–111)
Creatinine, Ser: 1.15 mg/dL (ref 0.61–1.24)
GFR calc Af Amer: >60 mL/min (ref >60)
GFR calc non Af Amer: >60 mL/min (ref >60)
Glucose, Bld: 189 mg/dL — ABNORMAL HIGH (ref 70–99)
Potassium: 4 mmol/L (ref 3.5–5.1)
Sodium: 139 mmol/L (ref 135–145)

## 2019-10-12 LAB — CBC
HCT: 43.5 % (ref 39.0–52.0)
Hemoglobin: 14.5 g/dL (ref 13.0–17.0)
MCH: 31.8 pg (ref 26.0–34.0)
MCHC: 33.3 g/dL (ref 30.0–36.0)
MCV: 95.4 fL (ref 80.0–100.0)
Platelets: 285 10*3/uL (ref 150–400)
RBC: 4.56 MIL/uL (ref 4.22–5.81)
RDW: 12.5 % (ref 11.5–15.5)
WBC: 9.6 10*3/uL (ref 4.0–10.5)
nRBC: 0 % (ref 0.0–0.2)

## 2019-10-12 LAB — GLUCOSE, CAPILLARY: Glucose-Capillary: 196 mg/dL — ABNORMAL HIGH (ref 70–99)

## 2019-10-12 NOTE — Progress Notes (Signed)
[place], [place] [address] [place] [address] Phone: [phone] Fax: [phone]      Your procedure is scheduled on September 22  Report to [facility] Main Entrance "A" at 0530 A.M., and check in at the Admitting office.  Call this number if you have problems the morning of surgery:  [phone]  Call [phone] if you have any questions prior to your surgery date Monday-Friday 8am-4pm    Remember:  Do not eat after midnight the night before your surgery  Please complete your PRE-SURGERY ENSURE that was provided to you by 4:30 the morning of surgery.  Please, if able, drink it in one setting. DO NOT SIP.     Take these medicines the morning of surgery with A SIP OF WATER  carvedilol (COREG) methocarbamol (ROBAXIN) if needed oxyCODONE-acetaminophen (PERCOCET/ROXICET) if needed   As of today, STOP taking any Aspirin (unless otherwise instructed by your surgeon) Aleve, Naproxen, Ibuprofen, Motrin, Advil, Goody's, BC's, all herbal medications, fish oil, and all vitamins.                      Do not wear jewelry            Do not wear lotions, powders, colognes, or deodorant.             Men may shave face and neck.            Do not bring valuables to the hospital.            [facility] is not responsible for any belongings or valuables.  Do NOT Smoke (Tobacco/Vaping) or drink Alcohol 24 hours prior to your procedure If you use a CPAP at night, you may bring all equipment for your overnight stay.   Contacts, glasses, dentures or bridgework may not be worn into surgery.      For patients admitted to the hospital, discharge time will be determined by your treatment team.   Patients discharged the day of surgery will not be allowed to drive home, and someone needs to stay with them for 24 hours.    Special instructions:   [place]- Preparing For Surgery  Before surgery, you can play an important role.  Because skin is not sterile, your skin needs to be as free of germs as possible. You can reduce the number of germs on your skin by washing with CHG (chlorahexidine gluconate) Soap before surgery.  CHG is an antiseptic cleaner which kills germs and bonds with the skin to continue killing germs even after washing.    Oral Hygiene is also important to reduce your risk of infection.  Remember - BRUSH YOUR TEETH THE MORNING OF SURGERY WITH YOUR REGULAR TOOTHPASTE  Please do not use if you have an allergy to CHG or antibacterial soaps. If your skin becomes reddened/irritated stop using the CHG.  Do not shave (including legs and underarms) for at least 48 hours prior to first CHG shower. It is OK to shave your face.  Please follow these instructions carefully.   1. Shower the NIGHT BEFORE SURGERY and the MORNING OF SURGERY with CHG Soap.   2. If you chose to wash your hair, wash your hair first as usual with your normal shampoo.  3. After you shampoo, rinse your hair and body thoroughly to remove the shampoo.  4. Use CHG as you would any other liquid soap. You can apply CHG directly to the skin  and wash gently with a scrungie or a clean washcloth.   5. Apply the CHG Soap to your body ONLY FROM THE NECK DOWN.  Do not use on open wounds or open sores. Avoid contact with your eyes, ears, mouth and genitals (private parts). Wash Face and genitals (private parts)  with your normal soap.   6. Wash thoroughly, paying special attention to the area where your surgery will be performed.  7. Thoroughly rinse your body with warm water from the neck down.  8. DO NOT shower/wash with your normal soap after using and rinsing off the CHG Soap.  9. Pat yourself dry with a CLEAN TOWEL.  10. Wear CLEAN PAJAMAS to bed the night before surgery  11. Place CLEAN SHEETS on your bed the night of your first shower and DO NOT SLEEP WITH PETS.   Day of Surgery: Wear Clean/Comfortable clothing the morning of  surgery Do not apply any deodorants/lotions.   Remember to brush your teeth WITH YOUR REGULAR TOOTHPASTE.   Please read over the following fact sheets that you were given.

## 2019-10-12 NOTE — Progress Notes (Signed)
PCP:  Jenna Luo, MD Cardiologist:  Rozann Lesches, MD  EKG:  09/20/19 CXR:  N/A ECHO:  01/12/17 Stress Test:  2013 Cardiac Cath:  01/11/17  Covid test 10/12/19  Anesthesia Review:  Yes, cardiac history.  CAD, Afib, abnormal EKG  Patient denies shortness of breath, fever, cough, and chest pain at PAT appointment.  Patient verbalized understanding of instructions provided today at the PAT appointment.  Patient asked to review instructions at home and day of surgery.

## 2019-10-13 ENCOUNTER — Other Ambulatory Visit (HOSPITAL_COMMUNITY)
Admission: RE | Admit: 2019-10-13 | Discharge: 2019-10-13 | Disposition: A | Discharge status: Home / Self Care | Payer: PPO | Source: Ambulatory Visit | Attending: General Surgery | Admitting: General Surgery

## 2019-10-13 DIAGNOSIS — Z01812 Encounter for preprocedural laboratory examination: Secondary | ICD-10-CM | POA: Diagnosis not present

## 2019-10-13 DIAGNOSIS — Z20822 Contact with and (suspected) exposure to covid-19: Secondary | ICD-10-CM | POA: Diagnosis not present

## 2019-10-13 LAB — SARS CORONAVIRUS 2 (TAT 6-24 HRS): SARS Coronavirus 2: NEGATIVE

## 2019-10-15 NOTE — Anesthesia Preprocedure Evaluation (Deleted)
Anesthesia Evaluation    Airway        Dental   Pulmonary           Cardiovascular hypertension,      Neuro/Psych    GI/Hepatic   Endo/Other    Renal/GU      Musculoskeletal   Abdominal   Peds  Hematology   Anesthesia Other Findings   Reproductive/Obstetrics                             Anesthesia Physical Anesthesia Plan  ASA:   Anesthesia Plan:    Post-op Pain Management:    Induction:   PONV Risk Score and Plan:   Airway Management Planned:   Additional Equipment:   Intra-op Plan:   Post-operative Plan:   Informed Consent:   Plan Discussed with:   Anesthesia Plan Comments: (PAT note written 10/15/2019 by Myra Gianotti, PA-C. )        Anesthesia Quick Evaluation                                  Anesthesia Evaluation  Patient identified by MRN, date of birth, ID band Patient awake    Reviewed: Allergy & Precautions, H&P , NPO status , Patient's Chart, lab work & pertinent test results  Airway Mallampati: II  TM Distance: >3 FB Neck ROM: Full    Dental no notable dental hx. (+) Teeth Intact, Dental Advisory Given   Pulmonary neg pulmonary ROS,    Pulmonary exam normal breath sounds clear to auscultation       Cardiovascular Exercise Tolerance: Good hypertension, Pt. on medications and Pt. on home beta blockers + CAD and + CABG  + dysrhythmias Atrial Fibrillation  Rhythm:Regular Rate:Normal     Neuro/Psych negative neurological ROS  negative psych ROS   GI/Hepatic negative GI ROS, Neg liver ROS,   Endo/Other  negative endocrine ROS  Renal/GU negative Renal ROS  negative genitourinary   Musculoskeletal  (+) Arthritis , Osteoarthritis,    Abdominal   Peds  Hematology negative hematology ROS (+)   Anesthesia Other Findings   Reproductive/Obstetrics negative OB ROS                             Anesthesia Physical Anesthesia Plan  ASA: III  Anesthesia Plan: General   Post-op Pain Management:    Induction: Intravenous  PONV Risk Score and Plan: 3 and Ondansetron, Dexamethasone and Treatment may vary due to age or medical condition  Airway Management Planned: Oral ETT  Additional Equipment:   Intra-op Plan:   Post-operative Plan: Extubation in OR  Informed Consent: I have reviewed the patients History and Physical, chart, labs and discussed the procedure including the risks, benefits and alternatives for the proposed anesthesia with the patient or authorized representative who has indicated his/her understanding and acceptance.     Dental advisory given  Plan Discussed with: CRNA  Anesthesia Plan Comments:        Anesthesia Quick Evaluation

## 2019-10-15 NOTE — Progress Notes (Signed)
Anesthesia Chart Review:  Case: [id number] Date/Time: 10/17/19 0715   Procedure: XI ROBOTIC ADRENALECTOMY (Right )   Anesthesia type: General   Pre-op diagnosis: RIGHT ADRENAL MASS   Location: [place] OR ROOM 10 / [place] OR   Surgeons: Ralene Ok, MD      DISCUSSION: Patient is a 79 year old male scheduled for the above procedure. He is s/p right L4-S1 laminectomy/foraminotomy  09/20/19.  History includes never smoker, HTN, CAD (NSTEMI, s/p CABG: LIMA-LAD, SVG-OM, SVG-RCA 01/12/17), post-operative afib, pre-diabetes, right adrenal mass, hyperaldosteronism, cervical fusion (1993), TMJ disease.  Last cardiology evaluation on 07/17/19 by [name]. He was undergoing right adrenal tumor work-up at that time. He wrote, ".Marland KitchenMarland KitchenHe anticipates surgical resection.  At this point I would not anticipate further cardiac work-up from a preoperative perspective in light of symptom stability on medical therapy and current functional capacity."  Presurgical COVID-19 test negative on 10/13/19. Anesthesia team to evaluate on the day of surgery.   VS: BP (!) 172/99    Pulse 73    Temp 36.7 C (Oral)    Resp 17    Ht 5' 6.5" (1.689 m)    Wt 75.4 kg    SpO2 98%    BMI 26.42 kg/m   BP Readings from Last 3 Encounters:  10/12/19 (!) 172/99  09/20/19 (!) 179/94  07/24/19 130/84    PROVIDERS: Susy Frizzle, MD is listed PCP. He saw Ishmael Holter, NP in June to establish care there. Previously saw Sinda Du, MD prior to his recent retirement.   Rozann Lesches, MD is cardiologist. Last visit 07/17/19 (telemedicine).  Mauricia Area, MD is nephrologist   LABS: Labs reviewed: Acceptable for surgery. A1c 6.0% 05/30/19.  (all labs ordered are listed, but only abnormal results are displayed)  Labs Reviewed  GLUCOSE, CAPILLARY - Abnormal; Notable for the following components:      Result Value   Glucose-Capillary 196 (*)    All other components within normal limits  BASIC METABOLIC PANEL - Abnormal;  Notable for the following components:   Glucose, Bld 189 (*)    All other components within normal limits  CBC    IMAGES: MRI Abd 07/06/19 (for evaluation of uncontrolled HTN, abnormal CT abd): IMPRESSION: 1. 3.9 x 3.3 x 3.5 cm right adrenal mass. This mass is indeterminate by MRI and has only potential trace loss of signal intensity on the out of phase T1 weighted imaging. Potentially a lipid poor adrenal adenoma, other neoplastic etiologies cannot be excluded. This lesion is not T2 hyperintense as typically seen in the setting of pheochromocytoma. Tissue sampling may be warranted. 2. 10 mm T1 intermediate to low intensity lesion in the left adrenal gland is indeterminate. Close follow-up recommended.   EKG: 09/20/19:  Normal sinus rhythm Left anterior fascicular block Minimal voltage criteria for LVH, may be normal variant ( Cornell product ) Cannot rule out Inferior infarct (masked by fascicular block?) , age undetermined Sinus rhythm has replaced Atrial fibrillation Since last tracing 2018   CV: TEE (intra-op CABG) 01/12/17: Study Conclusions  - Left ventricle: The cavity size was normal. Wall thickness was  increased in a pattern of mild LVH. Systolic function was normal.  The estimated ejection fraction was in the range of 60% to 65%.  Wall motion was normal; there were no regional wall motion  abnormalities. Doppler parameters are consistent with abnormal  left ventricular relaxation (grade 1 diastolic dysfunction). The  E/e&' ratio is between 8-15, suggesting indeterminate LV filling  pressure.  -  Aortic valve: Sclerosis without stenosis. There was trivial  regurgitation.  - Aorta: Aortic root dimension: 41 mm (ED).  - Aortic root: The aortic root is mildly dilated.  - Mitral valve: Calcified annulus. Mildly thickened leaflets .  There was trivial regurgitation.  - Left atrium: The atrium was normal in size.  - Inferior vena cava: The vessel  was normal in size. The  respirophasic diameter changes were in the normal range (= 50%),  consistent with normal central venous pressure.  - Urgent and Critical Findings:  A non-critical finding, normal LV  function, was reported to [name].  Impressions:  - LVEF 60-65%, mild LVH, normal wall motion, grade 1 DD,  indeterminate LV filling pressure, aortic sclerosis with trivial  AI, mildly dilated aortic root to 4.1 cm, aortic valve sclerosis,  trace central AI, trivial MR, normal LA size, normal IVC   Last cardiac cath was pre-CABG (01/11/17).   Carotid US 01/11/17: Final Interpretation:  - Right Carotid: There is evidence in the right ICA of a 1-39% stenosis.  - Left Carotid: There is evidence in the left ICA of a 1-39% stenosis.  - Vertebrals: Both vertebral arteries were patent with antegrade flow.    Past Medical History:  Diagnosis Date   Adrenal mass ([place])    right   Arthritis    CAD (coronary artery disease)    Multivessel/left main status post CABG 12/2016   Dysrhythmia    afib after surgery   Essential hypertension    Gynecomastia    Hyperaldosteronism    Hypertension    Phreesia 07/21/2019   Lumbar radiculopathy    Myocardial infarction ([place])    Phreesia 07/21/2019   Pneumonia    Postoperative atrial fibrillation (H[facility])    Pre-diabetes    Spinal stenosis    Temporomandibular joint disease    Wears glasses     Past Surgical History:  Procedure Laterality Date   CATARACT EXTRACTION W/PHACO Right 01/28/2015   Procedure: CATARACT EXTRACTION PHACO AND INTRAOCULAR LENS PLACEMENT RIGHT EYE;  Surgeon: Williams Che, MD;  Location: [facility];  Service: Ophthalmology;  Laterality: Right;  CDE:27.23   CATARACT EXTRACTION W/PHACO Left 04/14/2015   Procedure: CATARACT EXTRACTION PHACO AND INTRAOCULAR LENS PLACEMENT LEFT EYE CDE=15.86;  Surgeon: Williams Che, MD;  Location: [facility];  Service: Ophthalmology;  Laterality: Left;    [place]   COLONOSCOPY     CORONARY ARTERY BYPASS GRAFT N/A 01/12/2017   Procedure: CORONARY ARTERY BYPASS GRAFTING (CABG);  Surgeon: Grace Isaac, MD;  Location: [place];  Service: Open Heart Surgery;  Laterality: N/A;  Times 3 using left internal mammary artery to LAD and endoscopically harvested right thigh saphenous vein to OM and PD   EYE SURGERY N/A    Phreesia 07/21/2019   KNEE ARTHROSCOPY Right 10/09/2013   Procedure: RIGHT ARTHROSCOPY KNEE Partial medial and lateral meniscal tear and chondroplasty;  Surgeon: Hessie Dibble, MD;  Location: [place];  Service: Orthopedics;  Laterality: Right;  Partial medial and lateral meniscal tear and chondroplasty    LEFT HEART CATH AND CORONARY ANGIOGRAPHY N/A 01/11/2017   Procedure: LEFT HEART CATH AND CORONARY ANGIOGRAPHY;  Surgeon: Jettie Booze, MD;  Location: [place] CV LAB;  Service: Cardiovascular;  Laterality: N/A;   LUMBAR LAMINECTOMY/DECOMPRESSION MICRODISCECTOMY Right 09/20/2019   Procedure: Right Lumbar four- Lumbar five Lumbar five Sacral one Laminectomy/Foraminotomy;  Surgeon: Kristeen Miss, MD;  Location: [place];  Service:  Neurosurgery;  Laterality: Right;   TEE WITHOUT CARDIOVERSION N/A 01/12/2017   Procedure: TRANSESOPHAGEAL ECHOCARDIOGRAM (TEE);  Surgeon: Grace Isaac, MD;  Location: [place];  Service: Open Heart Surgery;  Laterality: N/A;   TONSILLECTOMY     VASECTOMY N/A    Phreesia 07/21/2019    MEDICATIONS:  acetaminophen (TYLENOL) 500 MG tablet   atorvastatin (LIPITOR) 20 MG tablet   carvedilol (COREG) 12.5 MG tablet   eplerenone (INSPRA) 50 MG tablet   methocarbamol (ROBAXIN) 500 MG tablet   Multiple Vitamin (MULTIVITAMIN WITH MINERALS) TABS tablet   oxyCODONE-acetaminophen (PERCOCET/ROXICET) 5-325 MG tablet   potassium chloride SA (KLOR-CON) 20 MEQ tablet   telmisartan (MICARDIS) 80 MG tablet   No current  facility-administered medications for this encounter.    Myra Gianotti, PA-C Surgical Short Stay/Anesthesiology [facility] Phone [phone] [facility] Phone [phone] 10/15/2019 9:53 PM

## 2019-10-16 NOTE — H&P (Signed)
History of Present Illness Ralene Ok MD; 08/07/2019 2:57 PM) The patient is a 79 year old male who presents with a complaint of adrenal mass. 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 Patient comes in with a right-sided adrenal mass. This measured approximately 3.9 x 3.5 x 3.3 cm right adrenal mass. Patient also has a 10 mm left adrenal mass. I did review CT scan laboratory studies personally.  Patient at this time does not have catecholamine studies. Patient is had significant expected electrolyzed abnormalities with hypokalemia. Patient's been on multiple blood pressure medications. He is currently on 3 blood pressure medications.  Patient is a retired Immunologist.  Patient presented laparoscopic cholecystectomy.   Past Surgical History Emeline Gins, CMA; 08/07/2019 2:22 PM) Cataract Surgery  Bilateral. Coronary Artery Bypass Graft  Gallbladder Surgery - Laparoscopic  Knee Surgery  Right. Tonsillectomy  Vasectomy   Diagnostic Studies History Emeline Gins, [place]; 08/07/2019 2:22 PM) Colonoscopy  5-10 years ago  Allergies Emeline Gins, [place]; 08/07/2019 2:23 PM) No Known Drug Allergies  [08/07/2019]: Allergies Reconciled   Medication History Emeline Gins, CMA; 08/07/2019 2:23 PM) Atorvastatin Calcium (20MG  Tablet, Oral) Active. Telmisartan (80MG  Tablet, Oral) Active. Carvedilol (12.5MG  Tablet, Oral) Active. Eplerenone (50MG  Tablet, Oral) Active. Aspirin (81MG  Tablet, Oral) Active. Medications Reconciled  Social History Emeline Gins, [place]; 08/07/2019 2:22 PM) Alcohol use  Occasional alcohol use. Caffeine use  Coffee, Tea. No drug use  Tobacco use  Never smoker.  Family History Emeline Gins, [place]; 08/07/2019 2:22 PM) Colon Cancer  Brother. Diabetes Mellitus  Sister. Melanoma  Brother.  Other Problems Emeline Gins, CMA; 08/07/2019 2:22 PM) Atrial Fibrillation  Chest pain   Cholelithiasis  High blood pressure  Hypercholesterolemia  Lump In Breast  Myocardial infarction     Review of Systems Emeline Gins CMA; 08/07/2019 2:22 PM) General Not Present- Appetite Loss, Chills, Fatigue, Fever, Night Sweats, Weight Gain and Weight Loss. Skin Present- Dryness. Not Present- Change in Wart/Mole, Hives, Jaundice, New Lesions, Non-Healing Wounds, Rash and Ulcer. HEENT Present- Wears glasses/contact lenses. Not Present- Earache, Hearing Loss, Hoarseness, Nose Bleed, Oral Ulcers, Ringing in the Ears, Seasonal Allergies, Sinus Pain, Sore Throat, Visual Disturbances and Yellow Eyes. Respiratory Not Present- Bloody sputum, Chronic Cough, Difficulty Breathing, Snoring and Wheezing. Breast Not Present- Breast Mass, Breast Pain, Nipple Discharge and Skin Changes. Cardiovascular Not Present- Chest Pain, Difficulty Breathing Lying Down, Leg Cramps, Palpitations, Rapid Heart Rate, Shortness of Breath and Swelling of Extremities. Gastrointestinal Not Present- Abdominal Pain, Bloating, Bloody Stool, Change in Bowel Habits, Chronic diarrhea, Constipation, Difficulty Swallowing, Excessive gas, Gets full quickly at meals, Hemorrhoids, Indigestion, Nausea, Rectal Pain and Vomiting. Male Genitourinary Present- Nocturia. Not Present- Blood in Urine, Change in Urinary Stream, Frequency, Impotence, Painful Urination, Urgency and Urine Leakage. Musculoskeletal Not Present- Back Pain, Joint Pain, Joint Stiffness, Muscle Pain, Muscle Weakness and Swelling of Extremities. Neurological Not Present- Decreased Memory, Fainting, Headaches, Numbness, Seizures, Tingling, Tremor, Trouble walking and Weakness. Psychiatric Not Present- Anxiety, Bipolar, Change in Sleep Pattern, Depression, Fearful and Frequent crying. Endocrine Not Present- Cold Intolerance, Excessive Hunger, Hair Changes, Heat Intolerance, Hot flashes and New Diabetes. Hematology Present- Blood Thinners and Easy Bruising. Not  Present- Excessive bleeding, Gland problems, HIV and Persistent Infections.  Vitals Emeline Gins CMA; 08/07/2019 2:23 PM) 08/07/2019 2:22 PM Weight: 166.2 lb Height: 67in Body Surface Area: 1.87 m Body Mass Index: 26.03 kg/m  Temp.: 98.83F  Pulse: 64 (Regular)  BP: 134/88(Sitting, Left Arm, Standard)  Physical Exam Ralene Ok MD; 08/07/2019 2:58 PM) The physical exam findings are as follows: Note: Constitutional: No acute distress, conversant, appears stated age  Eyes: Anicteric sclerae, moist conjunctiva, no lid lag  Neck: No thyromegaly, trachea midline, no cervical lymphadenopathy  Lungs: Clear to auscultation biilaterally, normal respiratory effot  Cardiovascular: regular rate & rhythm, no murmurs, no peripheal edema, pedal pulses 2+  GI: Soft, no masses or hepatosplenomegaly, non-tender to palpation  MSK: Normal gait, no clubbing cyanosis, edema  Skin: No rashes, palpation reveals normal skin turgor  Psychiatric: Appropriate judgment and insight, oriented to person, place, and time    Assessment & Plan Ralene Ok MD; 08/07/2019 3:00 PM) 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 This is currently at 3.9 cm, and on the verge of requiring excision secondary to size.  At this time and did not see any catecholamine studies. I will ask Dr. Jimmy Footman if he is comfortable in order the studies both urine and blood studies to rule out pheochromocytoma.  If these studies are negative for feel we will proceed to the operating room for a robotic right adrenalectomy.  I discussed with the patient the procedure detail. I discussed with him the risks and benefits of the procedure to include but not limited to: Infection, bleeding, damage to any structures, possible need for further surgery. The patient was  understanding and wished to proceed. Current Plans

## 2019-10-16 NOTE — Anesthesia Preprocedure Evaluation (Addendum)
Anesthesia Evaluation  Patient identified by MRN, date of birth, ID band Patient awake    Reviewed: Allergy & Precautions, H&P , NPO status , Patient's Chart, lab work & pertinent test results  History of Anesthesia Complications Negative for: history of anesthetic complications  Airway Mallampati: II  TM Distance: >3 FB Neck ROM: Full    Dental no notable dental hx. (+) Teeth Intact, Dental Advisory Given   Pulmonary neg pulmonary ROS,    Pulmonary exam normal breath sounds clear to auscultation       Cardiovascular Exercise Tolerance: Good hypertension, Pt. on medications and Pt. on home beta blockers + CAD and + CABG  + dysrhythmias Atrial Fibrillation  Rhythm:Regular Rate:Normal     Neuro/Psych negative neurological ROS  negative psych ROS   GI/Hepatic negative GI ROS, Neg liver ROS,   Endo/Other  Adrenal mass: hyperaldosterone  Renal/GU negative Renal ROS  negative genitourinary   Musculoskeletal  (+) Arthritis , Osteoarthritis,    Abdominal   Peds  Hematology negative hematology ROS (+)   Anesthesia Other Findings   Reproductive/Obstetrics negative OB ROS                            Anesthesia Physical  Anesthesia Plan  ASA: III  Anesthesia Plan: General   Post-op Pain Management:    Induction: Intravenous  PONV Risk Score and Plan: 4 or greater and Ondansetron, Dexamethasone, Treatment may vary due to age or medical condition and Diphenhydramine  Airway Management Planned: Oral ETT  Additional Equipment:   Intra-op Plan:   Post-operative Plan: Extubation in OR  Informed Consent: I have reviewed the patients History and Physical, chart, labs and discussed the procedure including the risks, benefits and alternatives for the proposed anesthesia with the patient or authorized representative who has indicated his/her understanding and acceptance.     Dental advisory  given  Plan Discussed with: Anesthesiologist and CRNA  Anesthesia Plan Comments:        Anesthesia Quick Evaluation

## 2019-10-17 ENCOUNTER — Inpatient Hospital Stay (HOSPITAL_COMMUNITY)
Admission: RE | Admit: 2019-10-17 | Discharge: 2019-10-18 | DRG: 615 | Disposition: A | Discharge status: Home / Self Care | Payer: PPO | Attending: General Surgery | Admitting: General Surgery

## 2019-10-17 ENCOUNTER — Other Ambulatory Visit: Payer: Self-pay

## 2019-10-17 ENCOUNTER — Inpatient Hospital Stay (HOSPITAL_COMMUNITY): Payer: PPO

## 2019-10-17 ENCOUNTER — Encounter (HOSPITAL_COMMUNITY): Payer: Self-pay | Admitting: General Surgery

## 2019-10-17 ENCOUNTER — Inpatient Hospital Stay (HOSPITAL_COMMUNITY): Payer: PPO | Admitting: Vascular Surgery

## 2019-10-17 ENCOUNTER — Telehealth: Payer: Self-pay | Admitting: Medical Oncology

## 2019-10-17 ENCOUNTER — Encounter (HOSPITAL_COMMUNITY)
Admission: RE | Disposition: A | Discharge status: Home / Self Care | Payer: Self-pay | Source: Home / Self Care | Attending: General Surgery

## 2019-10-17 DIAGNOSIS — Z8249 Family history of ischemic heart disease and other diseases of the circulatory system: Secondary | ICD-10-CM

## 2019-10-17 DIAGNOSIS — Z833 Family history of diabetes mellitus: Secondary | ICD-10-CM

## 2019-10-17 DIAGNOSIS — I10 Essential (primary) hypertension: Secondary | ICD-10-CM | POA: Diagnosis not present

## 2019-10-17 DIAGNOSIS — Z79899 Other long term (current) drug therapy: Secondary | ICD-10-CM | POA: Diagnosis not present

## 2019-10-17 DIAGNOSIS — I2510 Atherosclerotic heart disease of native coronary artery without angina pectoris: Secondary | ICD-10-CM | POA: Diagnosis present

## 2019-10-17 DIAGNOSIS — E782 Mixed hyperlipidemia: Secondary | ICD-10-CM | POA: Diagnosis not present

## 2019-10-17 DIAGNOSIS — D3501 Benign neoplasm of right adrenal gland: Principal | ICD-10-CM | POA: Diagnosis present

## 2019-10-17 DIAGNOSIS — E896 Postprocedural adrenocortical (-medullary) hypofunction: Secondary | ICD-10-CM

## 2019-10-17 DIAGNOSIS — Z800 Family history of malignant neoplasm of digestive organs: Secondary | ICD-10-CM

## 2019-10-17 DIAGNOSIS — I4891 Unspecified atrial fibrillation: Secondary | ICD-10-CM | POA: Diagnosis present

## 2019-10-17 DIAGNOSIS — E2609 Other primary hyperaldosteronism: Secondary | ICD-10-CM | POA: Diagnosis not present

## 2019-10-17 DIAGNOSIS — E2601 Conn's syndrome: Secondary | ICD-10-CM | POA: Diagnosis not present

## 2019-10-17 DIAGNOSIS — Z7982 Long term (current) use of aspirin: Secondary | ICD-10-CM

## 2019-10-17 DIAGNOSIS — Z951 Presence of aortocoronary bypass graft: Secondary | ICD-10-CM | POA: Diagnosis not present

## 2019-10-17 DIAGNOSIS — Z808 Family history of malignant neoplasm of other organs or systems: Secondary | ICD-10-CM

## 2019-10-17 DIAGNOSIS — E876 Hypokalemia: Secondary | ICD-10-CM | POA: Diagnosis present

## 2019-10-17 DIAGNOSIS — E278 Other specified disorders of adrenal gland: Secondary | ICD-10-CM | POA: Diagnosis not present

## 2019-10-17 DIAGNOSIS — E7800 Pure hypercholesterolemia, unspecified: Secondary | ICD-10-CM | POA: Diagnosis not present

## 2019-10-17 DIAGNOSIS — I252 Old myocardial infarction: Secondary | ICD-10-CM | POA: Diagnosis not present

## 2019-10-17 HISTORY — PX: ROBOTIC ADRENALECTOMY: SHX6407

## 2019-10-17 HISTORY — PX: ADRENAL GLAND SURGERY: SHX544

## 2019-10-17 SURGERY — ADRENALECTOMY, ROBOT-ASSISTED
Anesthesia: General | Site: Abdomen | Laterality: Right

## 2019-10-17 MED ORDER — 0.9 % SODIUM CHLORIDE (POUR BTL) OPTIME
TOPICAL | Status: DC | PRN
  Administered 2019-10-17: 1000 mL

## 2019-10-17 MED ORDER — KETOROLAC TROMETHAMINE 15 MG/ML IJ SOLN: 15.0000 mg | Freq: Four times a day (QID) | INTRAMUSCULAR | Status: DC | PRN

## 2019-10-17 MED ORDER — LACTATED RINGERS IV SOLN: INTRAVENOUS | Status: DC | PRN

## 2019-10-17 MED ORDER — HYDRALAZINE HCL 20 MG/ML IJ SOLN: 10.0000 mg | Freq: Four times a day (QID) | INTRAMUSCULAR | Status: DC | PRN

## 2019-10-17 MED ORDER — DEXAMETHASONE SODIUM PHOSPHATE 4 MG/ML IJ SOLN
INTRAMUSCULAR | Status: DC | PRN
  Administered 2019-10-17: 10 mg via INTRAVENOUS

## 2019-10-17 MED ORDER — BUPIVACAINE HCL (PF) 0.25 % IJ SOLN
INTRAMUSCULAR | Status: AC
  Filled 2019-10-17: qty 30

## 2019-10-17 MED ORDER — LIDOCAINE 2% (20 MG/ML) 5 ML SYRINGE
INTRAMUSCULAR | Status: AC
  Filled 2019-10-17: qty 5

## 2019-10-17 MED ORDER — FENTANYL CITRATE (PF) 250 MCG/5ML IJ SOLN
INTRAMUSCULAR | Status: AC
  Filled 2019-10-17: qty 5

## 2019-10-17 MED ORDER — ACETAMINOPHEN 500 MG PO TABS
ORAL_TABLET | ORAL | Status: AC
  Administered 2019-10-17: 500 mg
  Filled 2019-10-17: qty 2

## 2019-10-17 MED ORDER — FENTANYL CITRATE (PF) 100 MCG/2ML IJ SOLN
25.0000 ug | INTRAMUSCULAR | Status: DC | PRN
  Administered 2019-10-17: 25 ug via INTRAVENOUS

## 2019-10-17 MED ORDER — CEFAZOLIN SODIUM-DEXTROSE 2-3 GM-%(50ML) IV SOLR
INTRAVENOUS | Status: DC | PRN
  Administered 2019-10-17: 2 g via INTRAVENOUS

## 2019-10-17 MED ORDER — PHENYLEPHRINE HCL-NACL 10-0.9 MG/250ML-% IV SOLN
INTRAVENOUS | Status: DC | PRN
  Administered 2019-10-17: 25 ug/min via INTRAVENOUS

## 2019-10-17 MED ORDER — FENTANYL CITRATE (PF) 100 MCG/2ML IJ SOLN
INTRAMUSCULAR | Status: AC
  Filled 2019-10-17: qty 2

## 2019-10-17 MED ORDER — IRBESARTAN 300 MG PO TABS
300.0000 mg | ORAL_TABLET | Freq: Every day | ORAL | Status: DC
  Administered 2019-10-17: 300 mg via ORAL
  Filled 2019-10-17: qty 1

## 2019-10-17 MED ORDER — PHENYLEPHRINE 40 MCG/ML (10ML) SYRINGE FOR IV PUSH (FOR BLOOD PRESSURE SUPPORT)
PREFILLED_SYRINGE | INTRAVENOUS | Status: DC | PRN
  Administered 2019-10-17 (×2): 80 ug via INTRAVENOUS

## 2019-10-17 MED ORDER — ONDANSETRON 4 MG PO TBDP: 4.0000 mg | ORAL_TABLET | Freq: Four times a day (QID) | ORAL | Status: DC | PRN

## 2019-10-17 MED ORDER — PROPOFOL 10 MG/ML IV BOLUS
INTRAVENOUS | Status: AC
  Filled 2019-10-17: qty 40

## 2019-10-17 MED ORDER — ROCURONIUM BROMIDE 100 MG/10ML IV SOLN
INTRAVENOUS | Status: DC | PRN
  Administered 2019-10-17: 100 mg via INTRAVENOUS

## 2019-10-17 MED ORDER — BUPIVACAINE HCL 0.25 % IJ SOLN
INTRAMUSCULAR | Status: DC | PRN
  Administered 2019-10-17: 7 mL

## 2019-10-17 MED ORDER — HYDRALAZINE HCL 20 MG/ML IJ SOLN
10.0000 mg | Freq: Once | INTRAMUSCULAR | Status: AC
  Administered 2019-10-17: 10 mg via INTRAVENOUS

## 2019-10-17 MED ORDER — ROCURONIUM BROMIDE 10 MG/ML (PF) SYRINGE
PREFILLED_SYRINGE | INTRAVENOUS | Status: AC
  Filled 2019-10-17: qty 10

## 2019-10-17 MED ORDER — HYDRALAZINE HCL 20 MG/ML IJ SOLN
INTRAMUSCULAR | Status: AC
  Filled 2019-10-17: qty 1

## 2019-10-17 MED ORDER — ACETAMINOPHEN 500 MG PO TABS
1000.0000 mg | ORAL_TABLET | Freq: Four times a day (QID) | ORAL | Status: DC
  Administered 2019-10-17: 500 mg via ORAL
  Administered 2019-10-17: 1000 mg via ORAL
  Administered 2019-10-17 – 2019-10-18 (×2): 500 mg via ORAL
  Filled 2019-10-17 (×3): qty 2

## 2019-10-17 MED ORDER — MIDAZOLAM HCL 2 MG/2ML IJ SOLN
INTRAMUSCULAR | Status: AC
  Filled 2019-10-17: qty 2

## 2019-10-17 MED ORDER — BUPIVACAINE LIPOSOME 1.3 % IJ SUSP
INTRAMUSCULAR | Status: DC | PRN
  Administered 2019-10-17: 20 mL

## 2019-10-17 MED ORDER — ONDANSETRON HCL 4 MG/2ML IJ SOLN
INTRAMUSCULAR | Status: DC | PRN
  Administered 2019-10-17: 4 mg via INTRAVENOUS

## 2019-10-17 MED ORDER — LABETALOL HCL 5 MG/ML IV SOLN
20.0000 mg | INTRAVENOUS | Status: AC | PRN
  Administered 2019-10-17: 5 mg via INTRAVENOUS
  Administered 2019-10-17: 10 mg via INTRAVENOUS

## 2019-10-17 MED ORDER — LIDOCAINE HCL (CARDIAC) PF 100 MG/5ML IV SOSY
PREFILLED_SYRINGE | INTRAVENOUS | Status: DC | PRN
  Administered 2019-10-17: 100 mg via INTRAVENOUS

## 2019-10-17 MED ORDER — PROPOFOL 10 MG/ML IV BOLUS
INTRAVENOUS | Status: DC | PRN
  Administered 2019-10-17: 200 mg via INTRAVENOUS

## 2019-10-17 MED ORDER — ONDANSETRON HCL 4 MG/2ML IJ SOLN: 4.0000 mg | Freq: Four times a day (QID) | INTRAMUSCULAR | Status: DC | PRN

## 2019-10-17 MED ORDER — DEXAMETHASONE SODIUM PHOSPHATE 10 MG/ML IJ SOLN
INTRAMUSCULAR | Status: AC
  Filled 2019-10-17: qty 1

## 2019-10-17 MED ORDER — PROMETHAZINE HCL 25 MG/ML IJ SOLN: 6.2500 mg | INTRAMUSCULAR | Status: DC | PRN

## 2019-10-17 MED ORDER — CARVEDILOL 12.5 MG PO TABS
12.5000 mg | ORAL_TABLET | Freq: Two times a day (BID) | ORAL | Status: DC
  Administered 2019-10-17: 12.5 mg via ORAL
  Filled 2019-10-17: qty 1

## 2019-10-17 MED ORDER — OXYCODONE HCL 5 MG PO TABS: 5.0000 mg | ORAL_TABLET | ORAL | Status: DC | PRN

## 2019-10-17 MED ORDER — ALBUMIN HUMAN 5 % IV SOLN: INTRAVENOUS | Status: DC | PRN

## 2019-10-17 MED ORDER — ACETAMINOPHEN 500 MG PO TABS
ORAL_TABLET | ORAL | Status: AC
  Filled 2019-10-17: qty 2

## 2019-10-17 MED ORDER — METOPROLOL TARTRATE 5 MG/5ML IV SOLN
5.0000 mg | Freq: Four times a day (QID) | INTRAVENOUS | Status: DC | PRN
  Administered 2019-10-17: 5 mg via INTRAVENOUS
  Filled 2019-10-17: qty 5

## 2019-10-17 MED ORDER — LABETALOL HCL 5 MG/ML IV SOLN
INTRAVENOUS | Status: AC
  Administered 2019-10-17: 5 mg
  Filled 2019-10-17: qty 4

## 2019-10-17 MED ORDER — SPIRONOLACTONE 25 MG PO TABS
25.0000 mg | ORAL_TABLET | Freq: Two times a day (BID) | ORAL | Status: DC
  Administered 2019-10-17: 25 mg via ORAL
  Filled 2019-10-17: qty 1

## 2019-10-17 MED ORDER — FENTANYL CITRATE (PF) 100 MCG/2ML IJ SOLN
INTRAMUSCULAR | Status: DC | PRN
Start: 2019-10-17 — End: 2019-10-17
  Administered 2019-10-17: 50 ug via INTRAVENOUS
  Administered 2019-10-17: 100 ug via INTRAVENOUS
  Administered 2019-10-17 (×2): 50 ug via INTRAVENOUS

## 2019-10-17 MED ORDER — ONDANSETRON HCL 4 MG/2ML IJ SOLN
INTRAMUSCULAR | Status: AC
  Filled 2019-10-17: qty 2

## 2019-10-17 MED ORDER — SUGAMMADEX SODIUM 200 MG/2ML IV SOLN
INTRAVENOUS | Status: DC | PRN
  Administered 2019-10-17: 200 mg via INTRAVENOUS

## 2019-10-17 MED ORDER — CHLORHEXIDINE GLUCONATE 0.12 % MT SOLN
OROMUCOSAL | Status: AC
  Administered 2019-10-17: 15 mL
  Filled 2019-10-17: qty 15

## 2019-10-17 MED ORDER — CEFAZOLIN SODIUM-DEXTROSE 2-4 GM/100ML-% IV SOLN
INTRAVENOUS | Status: AC
  Filled 2019-10-17: qty 100

## 2019-10-17 MED ORDER — BUPIVACAINE LIPOSOME 1.3 % IJ SUSP
20.0000 mL | Freq: Once | INTRAMUSCULAR | Status: DC
  Filled 2019-10-17: qty 20

## 2019-10-17 SURGICAL SUPPLY — 62 items
ADH SKN CLS APL DERMABOND .7 (GAUZE/BANDAGES/DRESSINGS) ×1
APL PRP STRL LF DISP 70% ISPRP (MISCELLANEOUS) ×1
APPLIER CLIP 5 13 M/L LIGAMAX5 (MISCELLANEOUS)
APR CLP MED LRG 5 ANG JAW (MISCELLANEOUS)
BAG SPEC RTRVL 10 TROC 200 (ENDOMECHANICALS) ×1
CHLORAPREP W/TINT 26 (MISCELLANEOUS) ×2 IMPLANT
CLIP APPLIE 5 13 M/L LIGAMAX5 (MISCELLANEOUS) IMPLANT
CLIP VESOLOCK LG 6/CT PURPLE (CLIP) IMPLANT
CLIP VESOLOCK MED LG 6/CT (CLIP) IMPLANT
COVER MAYO STAND STRL (DRAPES) ×2 IMPLANT
COVER SURGICAL LIGHT HANDLE (MISCELLANEOUS) ×2 IMPLANT
COVER TIP SHEARS 8 DVNC (MISCELLANEOUS) ×1 IMPLANT
COVER TIP SHEARS 8MM DA VINCI (MISCELLANEOUS) ×2
COVER WAND RF STERILE (DRAPES) IMPLANT
DECANTER SPIKE VIAL GLASS SM (MISCELLANEOUS) ×2 IMPLANT
DEFOGGER SCOPE WARMER CLEARIFY (MISCELLANEOUS) ×2 IMPLANT
DERMABOND ADVANCED (GAUZE/BANDAGES/DRESSINGS) ×1
DERMABOND ADVANCED .7 DNX12 (GAUZE/BANDAGES/DRESSINGS) ×1 IMPLANT
DEVICE TROCAR PUNCTURE CLOSURE (ENDOMECHANICALS) ×1 IMPLANT
DRAIN CHANNEL 19F RND (DRAIN) IMPLANT
DRAPE ARM DVNC X/XI (DISPOSABLE) ×4 IMPLANT
DRAPE CARDIOVASC SPLIT 88X140 (DRAPES) ×2 IMPLANT
DRAPE COLUMN DVNC XI (DISPOSABLE) ×1 IMPLANT
DRAPE DA VINCI XI ARM (DISPOSABLE) ×8
DRAPE DA VINCI XI COLUMN (DISPOSABLE) ×2
DRAPE ORTHO SPLIT 77X108 STRL (DRAPES) ×2
DRAPE SURG ORHT 6 SPLT 77X108 (DRAPES) ×1 IMPLANT
ELECT REM PT RETURN 9FT ADLT (ELECTROSURGICAL) ×2
ELECTRODE REM PT RTRN 9FT ADLT (ELECTROSURGICAL) ×1 IMPLANT
GLOVE BIO SURGEON STRL SZ7.5 (GLOVE) ×4 IMPLANT
GOWN STRL REUS W/ TWL XL LVL3 (GOWN DISPOSABLE) ×3 IMPLANT
GOWN STRL REUS W/TWL 2XL LVL3 (GOWN DISPOSABLE) ×2 IMPLANT
GOWN STRL REUS W/TWL XL LVL3 (GOWN DISPOSABLE) ×6
IRRIGATION STRYKERFLOW (MISCELLANEOUS) ×1 IMPLANT
IRRIGATOR STRYKERFLOW (MISCELLANEOUS) ×2
KIT BASIN OR (CUSTOM PROCEDURE TRAY) ×2 IMPLANT
KIT TURNOVER KIT B (KITS) IMPLANT
NDL INSUFFLATION 14GA 120MM (NEEDLE) ×1 IMPLANT
NEEDLE HYPO 22GX1.5 SAFETY (NEEDLE) ×2 IMPLANT
NEEDLE INSUFFLATION 14GA 120MM (NEEDLE) ×2 IMPLANT
PENCIL SMOKE EVACUATOR (MISCELLANEOUS) IMPLANT
POUCH RETRIEVAL ECOSAC 10 (ENDOMECHANICALS) ×1 IMPLANT
POUCH RETRIEVAL ECOSAC 10MM (ENDOMECHANICALS) ×2
SCISSORS LAP 5X35 DISP (ENDOMECHANICALS) ×2 IMPLANT
SEAL CANN UNIV 5-8 DVNC XI (MISCELLANEOUS) ×4 IMPLANT
SEAL XI 5MM-8MM UNIVERSAL (MISCELLANEOUS) ×8
SEALER VESSEL DA VINCI XI (MISCELLANEOUS)
SEALER VESSEL EXT DVNC XI (MISCELLANEOUS) IMPLANT
SET IRRIG TUBING LAPAROSCOPIC (IRRIGATION / IRRIGATOR) ×2 IMPLANT
SET TUBE SMOKE EVAC HIGH FLOW (TUBING) ×2 IMPLANT
SPONGE LAP 18X18 RF (DISPOSABLE) ×2 IMPLANT
STOPCOCK 4 WAY LG BORE MALE ST (IV SETS) ×1 IMPLANT
SUT MNCRL AB 4-0 PS2 18 (SUTURE) ×2 IMPLANT
SUT PDS AB 1 CTX 36 (SUTURE) IMPLANT
SUT VICRYL 0 TIES 12 18 (SUTURE) IMPLANT
SUT VICRYL 0 UR6 27IN ABS (SUTURE) ×1 IMPLANT
SYR 20ML LL LF (SYRINGE) ×2 IMPLANT
TOWEL GREEN STERILE FF (TOWEL DISPOSABLE) ×2 IMPLANT
TRAY FOLEY MTR SLVR 16FR STAT (SET/KITS/TRAYS/PACK) IMPLANT
TRAY LAPAROSCOPIC MC (CUSTOM PROCEDURE TRAY) ×2 IMPLANT
TROCAR ADV FIXATION 5X100MM (TROCAR) ×2 IMPLANT
TUBE CONNECTING 12X1/4 (SUCTIONS) IMPLANT

## 2019-10-17 NOTE — Plan of Care (Signed)
  Problem: Skin Integrity: Goal: Risk for impaired skin integrity will decrease Outcome: Progressing   

## 2019-10-17 NOTE — Progress Notes (Signed)
Pt arrived to 6N24 via bed from PACU. Lap sites intact with skin glue. Cardiac monitoring in place, rate 103, sinus tach.

## 2019-10-17 NOTE — Interval H&P Note (Signed)
History and Physical Interval Note:  10/17/2019 7:01 AM  Christopher Booth  has presented today for surgery, with the diagnosis of RIGHT ADRENAL MASS.  The various methods of treatment have been discussed with the patient and family. After consideration of risks, benefits and other options for treatment, the patient has consented to  Procedure(s): XI ROBOTIC ADRENALECTOMY (Right) as a surgical intervention.  The patient's history has been reviewed, patient examined, no change in status, stable for surgery.  I have reviewed the patient's chart and labs.  Questions were answered to the patient's satisfaction.     Ralene Ok

## 2019-10-17 NOTE — Progress Notes (Signed)
BP 176/100.

## 2019-10-17 NOTE — Discharge Instructions (Signed)
Adrenalectomy  Adrenal glands are organs that make several hormones that your body needs in order to function. You have two adrenal glands, one above each kidney. An adrenalectomy is a surgery to remove an adrenal gland. You may need this surgery if an adrenal gland is making too much hormone or if you have a tumor on your adrenal gland. One healthy adrenal gland can produce enough adrenal hormones to live a normal life. So, after one adrenal gland is removed, hormone replacement is not needed. There are two kinds of adrenalectomy:  Laparoscopic. This kind is done through small cuts (incisions) with the help of a lighted, pencil-sized instrument (laparoscope).  Open. This kind is done through a larger incision. Tell a health care provider about:  Any allergies you have.  All medicines you are taking, including vitamins, herbs, eye drops, creams, and over-the-counter medicines.  Any problems you or family members have had with anesthetic medicines.  Any blood disorders you have.  Any surgeries you have had.  Any medical conditions you have.  Whether you are pregnant or may be pregnant. What are the risks? Generally, this is a safe procedure. However, problems may occur, including:  Infection.  Bleeding.  Allergic reactions to medicines.  Injury to other organs.  High blood pressure (hypertension) or low blood pressure (hypotension). What happens before the procedure? Staying hydrated Follow instructions from your health care provider about hydration, which may include:  Up to 2 hours before the procedure - you may continue to drink clear liquids, such as water, clear fruit juice, black coffee, and plain tea.  Eating and drinking restrictions Follow instructions from your health care provider about eating and drinking, which may include:  8 hours before the procedure - stop eating heavy meals or foods, such as meat, fried foods, or fatty foods.  6 hours before the  procedure - stop eating light meals or foods, such as toast or cereal.  6 hours before the procedure - stop drinking milk or drinks that contain milk.  2 hours before the procedure - stop drinking clear liquids. Medicines Ask your health care provider about:  Changing or stopping your regular medicines. This is especially important if you are taking diabetes medicines or blood thinners.  Taking medicines such as aspirin and ibuprofen. These medicines can thin your blood. Do not take these medicines unless your health care provider tells you to take them.  Taking prescription medicines to lower the levels of hormones if the glands are producing too much of them. These hormones are cortisol, aldosterone, or adrenaline.  Taking over-the-counter medicines, vitamins, herbs, and supplements. General instructions  Do not use any products that contain nicotine or tobacco for at least 4 weeks before the procedure. These products include cigarettes, e-cigarettes, and chewing tobacco. If you need help quitting, ask your health care provider.  Your health care provider may do blood tests and imaging tests.  Ask your health care provider: ? How your surgical site will be marked or identified. ? What steps will be taken to help prevent infection. These may include:  Removing hair at the surgery site.  Washing skin with a germ-killing soap.  Taking antibiotic medicine. What happens during the procedure?  An IV will be inserted into one of your veins.  You will be given one or both of the following: ? A medicine to help you relax (sedative). ? A medicine to make you fall asleep (general anesthetic).  A thin, flexible tube (catheter) may be put into  your bladder to drain urine.  A tube may be passed through your nose or mouth and into your stomach (NG tube or nasogastric tube). This tube removes fluid from your stomach to keep you from feeling nauseous and vomiting.  Your surgeon will choose  one of the following methods for your surgery. ? For laparoscopic adrenalectomy:  3-4 small incisions will be made in your abdomen.  Carbon dioxide will be pumped into your abdomen to make it easy for your surgeon to see internal organs.  A laparoscope and other small surgical instruments will be put through the incisions. ? For open adrenalectomy:  A large incision will be made under your rib cage, in the middle of your abdomen, or along your side.  The blood vessels that lead to the adrenal gland will be tied off or clipped to prevent bleeding.  The adrenal gland will be removed.  Nearby organs will be checked.  Your abdomen will be rinsed with sterile saline solution.  Your muscles will be stitched (sutured) back together.  Your incisions will be closed. This may be done using stitches (sutures), staples, skin glue, or adhesive strips.  A bandage (dressing) will be used to cover the incisions. The procedure may vary among health care providers and hospitals. What happens after the procedure?  Your blood pressure, heart rate, breathing rate, and blood oxygen level will be monitored until you leave the hospital or clinic.  If a tube was inserted into your bladder or stomach, it will be removed as soon as possible.  You may have to wear compression stockings. These stockings help to prevent blood clots and reduce swelling in your legs.  You will be given medicines: ? To treat pain. ? To regulate certain hormone levels. ? To control blood pressure. Summary  An adrenalectomy is a surgery to remove an adrenal gland.  This is a safe procedure. However, problems may occur, such as infection, bleeding, allergic reactions to medicines, high or low blood pressure, or injury to other organs.  Follow your health care provider's instructions about eating and drinking, quitting smoking, and taking medicines before the procedure.  Your surgery will be done in one of two ways:  minimally invasive surgery or open surgery.  You will be monitored closely after the surgery. You will also be given medicines to treat pain, regulate certain hormone levels, and control blood pressure. This information is not intended to replace advice given to you by your health care provider. Make sure you discuss any questions you have with your health care provider. Document Revised: 10/04/2017 Document Reviewed: 10/04/2017 Elsevier Patient Education  [place].

## 2019-10-17 NOTE — Op Note (Signed)
10/17/2019  9:36 AM  PATIENT:  Christopher Booth  79 y.o. male  PRE-OPERATIVE DIAGNOSIS:  RIGHT ADRENAL MASS  POST-OPERATIVE DIAGNOSIS:  RIGHT ADRENAL MASS  PROCEDURE:  Procedure(s): XI ROBOTIC ADRENALECTOMY (Right)  SURGEON:  Surgeon(s) and Role:    Ralene Ok, MD - Primary    Michael Boston, MD - Assisting-who was essential at helping with retraction, suction/irrigating, and identification of the anatomy.  ANESTHESIA:   local and general  EBL:  minimal   BLOOD ADMINISTERED:none  DRAINS: none   LOCAL MEDICATIONS USED:  BUPIVICAINE  + Exparil  SPECIMEN:  Source of Specimen:  Right adrenal gland  DISPOSITION OF SPECIMEN:  PATHOLOGY  COUNTS:  YES  TOURNIQUET:  * No tourniquets in log *  DICTATION: .Dragon Dictation Indication procedure: Patient is a 79 year old male with a right-sided adrenal mass. This was approximately 3.8 cm in size. Patient underwent work-up to rule out pheochromocytoma. This was ruled out. Patient was taken back to the operating for excision of mass with concern for possible adrenocortical carcinoma.  Findings: Patient had a right adrenal mass as expected. This was soft when palpated. Patient had his right adrenal vein doubly ligated with Weck clips.  Details procedure: The patient was consented he was taken back to the OR placed in supine position with bilateral SCDs in place. He underwent general trach intubation. He was placed in the lateral decubitus position with the right side up. Patient was then prepped and draped standard fashion. A timeout was called and all facts verified. A Veress needle technique was used to insufflate the abdomen to 15 mmHg in the right subcostal margin. Subsequent to this an 8 mm trocar and camera then placed intra-abdominally. There was no injury to any intra-abdominal organs. The three 8 mm working trochars were then placed under direct visualization along the right costal margin. At this time the patient cart was  brought to the bedside. The robot was docked.  At this time I was able to incise the triangular ligament of the liver. This was taken medially. The liver was retracted cephalad. At this time the IVC could be seen just inferior to the liver. I then proceeded to incise the peritoneum and work inferiorly. There is large amount of fatty tissue within this area. I was able to delineate the edge of the IVC. I continue to work inferiorly. Both with medial lateral retraction and minimizing traction to the mass I was able to dissect the IVC from the surrounding tissue. The right adrenal vein was visualized. 2 Weck clips were then placed proximally 1 distally and the adrenal vein was transected. Then using bipolar cautery and monopolar cautery I was able to free the adrenal mass and adrenal from the retroperitoneum. This was done circumferentially. Once this was removed the area was irrigated out to assess for hemostasis. This was excellent.  At this time the robot cart was undocked. A retrieval bag was then placed intra-abdominally. The mesh was placed in the retrieval bag. This was removed. And 8 mm subcostal trocar site. This was enlarged. Once this was removed the trocar site was then reapproximated using 0 Vicryl's x3 and a Endo Close device.  At this time insufflation was evacuated. Trocar sites and skin was reapproximate using 4 Monocryl subcuticular fashion. The skin was dressed with Dermabond.  The patient tired procedure well was taken to the recovery in stable condition.    PLAN OF CARE: Admit for overnight observation  PATIENT DISPOSITION:  PACU - hemodynamically stable.  Delay start of Pharmacological VTE agent (>24hrs) due to surgical blood loss or risk of bleeding: yes

## 2019-10-17 NOTE — Anesthesia Procedure Notes (Addendum)
Procedure Name: Intubation Date/Time: 10/17/2019 7:47 AM Performed by: Kyung Rudd, CRNA Pre-anesthesia Checklist: Patient identified, Emergency Drugs available, Suction available and Patient being monitored Patient Re-evaluated:Patient Re-evaluated prior to induction Oxygen Delivery Method: Circle System Utilized Preoxygenation: Pre-oxygenation with 100% oxygen Induction Type: IV induction Ventilation: Mask ventilation without difficulty Laryngoscope Size: Mac and 4 Grade View: Grade II Tube type: Oral Tube size: 7.5 mm Number of attempts: 1 Airway Equipment and Method: Stylet and Oral airway Placement Confirmation: ETT inserted through vocal cords under direct vision,  positive ETCO2 and breath sounds checked- equal and bilateral Secured at: 22 cm Tube secured with: Tape Dental Injury: Teeth and Oropharynx as per pre-operative assessment  Comments: Performed by Heide Scales, SRNA

## 2019-10-17 NOTE — Progress Notes (Signed)
Gave Lopressor 5 mg for BP>180. Pt minimally assisted up to bathroom and voided.

## 2019-10-17 NOTE — Anesthesia Postprocedure Evaluation (Signed)
Anesthesia Post Note  Patient: Christopher Booth  Procedure(s) Performed: XI ROBOTIC ADRENALECTOMY (Right Abdomen)     Patient location during evaluation: PACU Anesthesia Type: General Level of consciousness: sedated Pain management: pain level controlled Vital Signs Assessment: post-procedure vital signs reviewed and stable Respiratory status: spontaneous breathing and respiratory function stable Cardiovascular status: stable Postop Assessment: no apparent nausea or vomiting Anesthetic complications: no   No complications documented.  Last Vitals:  Vitals:   10/17/19 1228 10/17/19 1230  BP: (!) 189/105 (!) 182/96  Pulse:  77  Resp:  19  Temp:    SpO2:  98%    Last Pain:  Vitals:   10/17/19 1200  TempSrc:   PainSc: 4                  Khole Branch DANIEL

## 2019-10-17 NOTE — Progress Notes (Signed)
Called Dr. Dema Severin to reorder home BP meds.

## 2019-10-17 NOTE — Progress Notes (Signed)
BP 168/105.

## 2019-10-17 NOTE — Progress Notes (Signed)
Outpaient studies show no increased urine metanephrines on 24hr studies.

## 2019-10-17 NOTE — Telephone Encounter (Signed)
err

## 2019-10-17 NOTE — Transfer of Care (Signed)
Immediate Anesthesia Transfer of Care Note  Patient: Christopher Booth  Procedure(s) Performed: XI ROBOTIC ADRENALECTOMY (Right Abdomen)  Patient Location: PACU  Anesthesia Type:General  Level of Consciousness: drowsy  Airway & Oxygen Therapy: Patient Spontanous Breathing  Post-op Assessment: Report given to RN, Post -op Vital signs reviewed and stable and Patient moving all extremities X 4  Post vital signs: Reviewed and stable  Last Vitals:  Vitals Value Taken Time  BP 201/112 10/17/19 0957  Temp    Pulse 63 10/17/19 0959  Resp 14 10/17/19 0959  SpO2 98 % 10/17/19 0959  Vitals shown include unvalidated device data.  Last Pain:  Vitals:   10/17/19 0616  TempSrc: Oral  PainSc: 0-No pain         Complications: No complications documented.

## 2019-10-18 ENCOUNTER — Encounter (HOSPITAL_COMMUNITY): Payer: Self-pay | Admitting: General Surgery

## 2019-10-18 LAB — BASIC METABOLIC PANEL
Anion gap: 14 (ref 5–15)
BUN: 19 mg/dL (ref 8–23)
CO2: 28 mmol/L (ref 22–32)
Calcium: 9.5 mg/dL (ref 8.9–10.3)
Chloride: 96 mmol/L — ABNORMAL LOW (ref 98–111)
Creatinine, Ser: 1.06 mg/dL (ref 0.61–1.24)
GFR calc Af Amer: >60 mL/min (ref >60)
GFR calc non Af Amer: >60 mL/min (ref >60)
Glucose, Bld: 172 mg/dL — ABNORMAL HIGH (ref 70–99)
Potassium: 3 mmol/L — ABNORMAL LOW (ref 3.5–5.1)
Sodium: 138 mmol/L (ref 135–145)

## 2019-10-18 LAB — CBC
HCT: 43.9 % (ref 39.0–52.0)
Hemoglobin: 15 g/dL (ref 13.0–17.0)
MCH: 31.1 pg (ref 26.0–34.0)
MCHC: 34.2 g/dL (ref 30.0–36.0)
MCV: 91.1 fL (ref 80.0–100.0)
Platelets: 311 10*3/uL (ref 150–400)
RBC: 4.82 MIL/uL (ref 4.22–5.81)
RDW: 12.4 % (ref 11.5–15.5)
WBC: 14.6 10*3/uL — ABNORMAL HIGH (ref 4.0–10.5)
nRBC: 0 % (ref 0.0–0.2)

## 2019-10-18 NOTE — Discharge Summary (Signed)
Physician Discharge Summary  Patient ID: Christopher Booth MRN: [id number] DOB/AGE: 04-15-1940 79 y.o.  Admit date: 10/17/2019 Discharge date: 10/18/2019  Admission Diagnoses: R adrenal mass  Discharge Diagnoses:  S/p R adrenalectomy  Discharged Condition: good  Hospital Course: Pt was admitted post op and started on ERAS protocol.  He did have some HTN issues but seemed to resolve once his home BP Rxs were re ordered.  He was tol PO well and ambulating well. Pt c/o no pain.  Pt was deemed stable for DC and Dc'd home  Consults: None  Significant Diagnostic Studies: none  Treatments: surgery: as above  Discharge Exam: Blood pressure 133/82, pulse 87, temperature 98 F (36.7 C), temperature source Oral, resp. rate 18, height 5' 6.5" (1.689 m), weight 75.4 kg, SpO2 97 %. General appearance: alert and cooperative GI: soft, non-tender; bowel sounds normal; no masses,  no organomegaly and inc c/d/i  Disposition: Discharge disposition: 01-Home or Self Care       Discharge Instructions    Diet - low sodium heart healthy   Complete by: As directed    Increase activity slowly   Complete by: As directed      Allergies as of 10/18/2019      Reactions   Diovan [valsartan]    unknown      Medication List    TAKE these medications   acetaminophen 500 MG tablet Commonly known as: TYLENOL Take 500 mg by mouth every 6 (six) hours as needed for mild pain.   atorvastatin 20 MG tablet Commonly known as: LIPITOR Take 1 tablet (20 mg total) by mouth daily. What changed: when to take this   carvedilol 12.5 MG tablet Commonly known as: COREG Take 12.5 mg by mouth in the morning and at bedtime.   eplerenone 50 MG tablet Commonly known as: INSPRA Take 50 mg by mouth 2 (two) times daily.   methocarbamol 500 MG tablet Commonly known as: ROBAXIN Take 500-1,000 mg by mouth 4 (four) times daily as needed for spasms.   multivitamin with minerals Tabs tablet Take 1 tablet by mouth  daily.   oxyCODONE-acetaminophen 5-325 MG tablet Commonly known as: PERCOCET/ROXICET Take 1 tablet by mouth every 4 (four) hours as needed.   potassium chloride SA 20 MEQ tablet Commonly known as: KLOR-CON Take 20 mEq by mouth daily.   telmisartan 80 MG tablet Commonly known as: MICARDIS Take 80 mg by mouth at bedtime.       Follow-up Information    Ralene Ok, MD. Schedule an appointment as soon as possible for a visit in 2 weeks.   Specialty: General Surgery Why: Post op visit Contact information: [place] [address] [phone]               Signed: Ralene Ok 10/18/2019, 8:30 AM

## 2019-10-18 NOTE — Progress Notes (Signed)
Discharge instructions reviewed at bedside with patient who verbalized understanding and declined further education.  Patient transported off unit for discharge via wheelchair and staff suprvision

## 2019-10-19 LAB — SURGICAL PATHOLOGY

## 2019-10-25 DIAGNOSIS — M545 Low back pain: Secondary | ICD-10-CM | POA: Diagnosis not present

## 2019-10-25 DIAGNOSIS — E278 Other specified disorders of adrenal gland: Secondary | ICD-10-CM | POA: Diagnosis not present

## 2019-10-25 DIAGNOSIS — M21379 Foot drop, unspecified foot: Secondary | ICD-10-CM | POA: Diagnosis not present

## 2019-10-25 DIAGNOSIS — M5106 Intervertebral disc disorders with myelopathy, lumbar region: Secondary | ICD-10-CM | POA: Diagnosis not present

## 2019-10-26 ENCOUNTER — Telehealth: Payer: Self-pay | Admitting: Physician Assistant

## 2019-10-26 NOTE — Telephone Encounter (Signed)
Opened in error

## 2019-10-30 DIAGNOSIS — M21379 Foot drop, unspecified foot: Secondary | ICD-10-CM | POA: Diagnosis not present

## 2019-10-30 DIAGNOSIS — M5106 Intervertebral disc disorders with myelopathy, lumbar region: Secondary | ICD-10-CM | POA: Diagnosis not present

## 2019-11-01 DIAGNOSIS — M5106 Intervertebral disc disorders with myelopathy, lumbar region: Secondary | ICD-10-CM | POA: Diagnosis not present

## 2019-11-01 DIAGNOSIS — M21379 Foot drop, unspecified foot: Secondary | ICD-10-CM | POA: Diagnosis not present

## 2019-11-09 DIAGNOSIS — I129 Hypertensive chronic kidney disease with stage 1 through stage 4 chronic kidney disease, or unspecified chronic kidney disease: Secondary | ICD-10-CM | POA: Diagnosis not present

## 2019-11-09 DIAGNOSIS — N182 Chronic kidney disease, stage 2 (mild): Secondary | ICD-10-CM | POA: Diagnosis not present

## 2019-11-20 DIAGNOSIS — M21379 Foot drop, unspecified foot: Secondary | ICD-10-CM | POA: Diagnosis not present

## 2019-11-20 DIAGNOSIS — M5106 Intervertebral disc disorders with myelopathy, lumbar region: Secondary | ICD-10-CM | POA: Diagnosis not present

## 2019-11-27 DIAGNOSIS — M21379 Foot drop, unspecified foot: Secondary | ICD-10-CM | POA: Diagnosis not present

## 2019-11-27 DIAGNOSIS — M5106 Intervertebral disc disorders with myelopathy, lumbar region: Secondary | ICD-10-CM | POA: Diagnosis not present

## 2019-11-28 DIAGNOSIS — E2609 Other primary hyperaldosteronism: Secondary | ICD-10-CM | POA: Diagnosis not present

## 2019-11-28 DIAGNOSIS — I129 Hypertensive chronic kidney disease with stage 1 through stage 4 chronic kidney disease, or unspecified chronic kidney disease: Secondary | ICD-10-CM | POA: Diagnosis not present

## 2019-11-28 DIAGNOSIS — M1711 Unilateral primary osteoarthritis, right knee: Secondary | ICD-10-CM | POA: Diagnosis not present

## 2019-11-28 DIAGNOSIS — I2510 Atherosclerotic heart disease of native coronary artery without angina pectoris: Secondary | ICD-10-CM | POA: Diagnosis not present

## 2019-11-28 DIAGNOSIS — N62 Hypertrophy of breast: Secondary | ICD-10-CM | POA: Diagnosis not present

## 2019-11-28 DIAGNOSIS — N182 Chronic kidney disease, stage 2 (mild): Secondary | ICD-10-CM | POA: Diagnosis not present

## 2019-11-28 DIAGNOSIS — Z0000 Encounter for general adult medical examination without abnormal findings: Secondary | ICD-10-CM | POA: Diagnosis not present

## 2019-11-28 DIAGNOSIS — I4891 Unspecified atrial fibrillation: Secondary | ICD-10-CM | POA: Diagnosis not present

## 2019-11-28 DIAGNOSIS — Z951 Presence of aortocoronary bypass graft: Secondary | ICD-10-CM | POA: Diagnosis not present

## 2019-11-28 DIAGNOSIS — R319 Hematuria, unspecified: Secondary | ICD-10-CM | POA: Diagnosis not present

## 2019-11-28 DIAGNOSIS — E782 Mixed hyperlipidemia: Secondary | ICD-10-CM | POA: Diagnosis not present

## 2019-11-29 DIAGNOSIS — M5106 Intervertebral disc disorders with myelopathy, lumbar region: Secondary | ICD-10-CM | POA: Diagnosis not present

## 2019-11-29 DIAGNOSIS — N182 Chronic kidney disease, stage 2 (mild): Secondary | ICD-10-CM | POA: Diagnosis not present

## 2019-11-29 DIAGNOSIS — M21379 Foot drop, unspecified foot: Secondary | ICD-10-CM | POA: Diagnosis not present

## 2019-11-30 DIAGNOSIS — E876 Hypokalemia: Secondary | ICD-10-CM | POA: Diagnosis not present

## 2019-12-04 DIAGNOSIS — M21379 Foot drop, unspecified foot: Secondary | ICD-10-CM | POA: Diagnosis not present

## 2019-12-04 DIAGNOSIS — M5106 Intervertebral disc disorders with myelopathy, lumbar region: Secondary | ICD-10-CM | POA: Diagnosis not present

## 2019-12-05 ENCOUNTER — Encounter: Payer: Self-pay | Admitting: Nurse Practitioner

## 2019-12-06 ENCOUNTER — Encounter: Payer: Self-pay | Admitting: Internal Medicine

## 2019-12-06 DIAGNOSIS — M21379 Foot drop, unspecified foot: Secondary | ICD-10-CM | POA: Diagnosis not present

## 2019-12-06 DIAGNOSIS — M5106 Intervertebral disc disorders with myelopathy, lumbar region: Secondary | ICD-10-CM | POA: Diagnosis not present

## 2019-12-11 DIAGNOSIS — M21379 Foot drop, unspecified foot: Secondary | ICD-10-CM | POA: Diagnosis not present

## 2019-12-11 DIAGNOSIS — M5106 Intervertebral disc disorders with myelopathy, lumbar region: Secondary | ICD-10-CM | POA: Diagnosis not present

## 2019-12-13 DIAGNOSIS — M21379 Foot drop, unspecified foot: Secondary | ICD-10-CM | POA: Diagnosis not present

## 2019-12-13 DIAGNOSIS — M5106 Intervertebral disc disorders with myelopathy, lumbar region: Secondary | ICD-10-CM | POA: Diagnosis not present

## 2019-12-17 DIAGNOSIS — Z951 Presence of aortocoronary bypass graft: Secondary | ICD-10-CM | POA: Insufficient documentation

## 2019-12-18 ENCOUNTER — Other Ambulatory Visit: Payer: Self-pay

## 2019-12-18 ENCOUNTER — Other Ambulatory Visit (HOSPITAL_COMMUNITY)
Admission: RE | Admit: 2019-12-18 | Discharge: 2019-12-18 | Disposition: A | Discharge status: Home / Self Care | Payer: PPO | Source: Ambulatory Visit | Attending: Nephrology | Admitting: Nephrology

## 2019-12-18 DIAGNOSIS — M5106 Intervertebral disc disorders with myelopathy, lumbar region: Secondary | ICD-10-CM | POA: Diagnosis not present

## 2019-12-18 DIAGNOSIS — I5032 Chronic diastolic (congestive) heart failure: Secondary | ICD-10-CM | POA: Insufficient documentation

## 2019-12-18 DIAGNOSIS — E896 Postprocedural adrenocortical (-medullary) hypofunction: Secondary | ICD-10-CM | POA: Diagnosis not present

## 2019-12-18 DIAGNOSIS — E876 Hypokalemia: Secondary | ICD-10-CM | POA: Diagnosis not present

## 2019-12-18 DIAGNOSIS — E873 Alkalosis: Secondary | ICD-10-CM | POA: Insufficient documentation

## 2019-12-18 DIAGNOSIS — E2609 Other primary hyperaldosteronism: Secondary | ICD-10-CM | POA: Diagnosis not present

## 2019-12-18 DIAGNOSIS — N182 Chronic kidney disease, stage 2 (mild): Secondary | ICD-10-CM | POA: Diagnosis not present

## 2019-12-18 DIAGNOSIS — R809 Proteinuria, unspecified: Secondary | ICD-10-CM | POA: Diagnosis not present

## 2019-12-18 DIAGNOSIS — M21379 Foot drop, unspecified foot: Secondary | ICD-10-CM | POA: Diagnosis not present

## 2019-12-18 DIAGNOSIS — R6889 Other general symptoms and signs: Secondary | ICD-10-CM | POA: Diagnosis not present

## 2019-12-18 LAB — RENAL FUNCTION PANEL
Albumin: 3.8 g/dL (ref 3.5–5.0)
Anion gap: 8 (ref 5–15)
BUN: 15 mg/dL (ref 8–23)
CO2: 31 mmol/L (ref 22–32)
Calcium: 9.5 mg/dL (ref 8.9–10.3)
Chloride: 102 mmol/L (ref 98–111)
Creatinine, Ser: 0.92 mg/dL (ref 0.61–1.24)
GFR, Estimated: >60 mL/min (ref >60)
Glucose, Bld: 93 mg/dL (ref 70–99)
Phosphorus: 3.7 mg/dL (ref 2.5–4.6)
Potassium: 2.8 mmol/L — ABNORMAL LOW (ref 3.5–5.1)
Sodium: 141 mmol/L (ref 135–145)

## 2019-12-24 DIAGNOSIS — E2609 Other primary hyperaldosteronism: Secondary | ICD-10-CM | POA: Diagnosis not present

## 2019-12-24 DIAGNOSIS — E876 Hypokalemia: Secondary | ICD-10-CM | POA: Diagnosis not present

## 2019-12-24 DIAGNOSIS — N182 Chronic kidney disease, stage 2 (mild): Secondary | ICD-10-CM | POA: Diagnosis not present

## 2019-12-24 DIAGNOSIS — R809 Proteinuria, unspecified: Secondary | ICD-10-CM | POA: Diagnosis not present

## 2019-12-24 DIAGNOSIS — I5032 Chronic diastolic (congestive) heart failure: Secondary | ICD-10-CM | POA: Diagnosis not present

## 2019-12-24 DIAGNOSIS — E873 Alkalosis: Secondary | ICD-10-CM | POA: Diagnosis not present

## 2019-12-24 DIAGNOSIS — E896 Postprocedural adrenocortical (-medullary) hypofunction: Secondary | ICD-10-CM | POA: Diagnosis not present

## 2019-12-25 DIAGNOSIS — M5106 Intervertebral disc disorders with myelopathy, lumbar region: Secondary | ICD-10-CM | POA: Diagnosis not present

## 2019-12-25 DIAGNOSIS — M21379 Foot drop, unspecified foot: Secondary | ICD-10-CM | POA: Diagnosis not present

## 2020-01-01 ENCOUNTER — Other Ambulatory Visit: Payer: Self-pay | Admitting: Family Medicine

## 2020-01-01 DIAGNOSIS — E782 Mixed hyperlipidemia: Secondary | ICD-10-CM

## 2020-01-01 DIAGNOSIS — M21379 Foot drop, unspecified foot: Secondary | ICD-10-CM | POA: Diagnosis not present

## 2020-01-01 DIAGNOSIS — M5106 Intervertebral disc disorders with myelopathy, lumbar region: Secondary | ICD-10-CM | POA: Diagnosis not present

## 2020-01-09 DIAGNOSIS — E876 Hypokalemia: Secondary | ICD-10-CM | POA: Diagnosis not present

## 2020-01-09 DIAGNOSIS — R809 Proteinuria, unspecified: Secondary | ICD-10-CM | POA: Diagnosis not present

## 2020-01-09 DIAGNOSIS — N182 Chronic kidney disease, stage 2 (mild): Secondary | ICD-10-CM | POA: Diagnosis not present

## 2020-01-09 DIAGNOSIS — N400 Enlarged prostate without lower urinary tract symptoms: Secondary | ICD-10-CM | POA: Diagnosis not present

## 2020-01-09 DIAGNOSIS — E2609 Other primary hyperaldosteronism: Secondary | ICD-10-CM | POA: Diagnosis not present

## 2020-01-16 ENCOUNTER — Encounter: Payer: Self-pay | Admitting: Cardiology

## 2020-01-16 DIAGNOSIS — R809 Proteinuria, unspecified: Secondary | ICD-10-CM | POA: Diagnosis not present

## 2020-01-16 DIAGNOSIS — E876 Hypokalemia: Secondary | ICD-10-CM | POA: Diagnosis not present

## 2020-01-16 DIAGNOSIS — N182 Chronic kidney disease, stage 2 (mild): Secondary | ICD-10-CM | POA: Diagnosis not present

## 2020-01-16 DIAGNOSIS — E2609 Other primary hyperaldosteronism: Secondary | ICD-10-CM | POA: Diagnosis not present

## 2020-01-16 DIAGNOSIS — N400 Enlarged prostate without lower urinary tract symptoms: Secondary | ICD-10-CM | POA: Diagnosis not present

## 2020-01-16 NOTE — Progress Notes (Signed)
Cardiology Office Note  Date: 01/17/2020   ID: Christopher Booth, Christopher Booth July 16, 1940, MRN [id number]  PCP:  Donita Brooks, MD  Cardiologist:  Nona Dell, MD Electrophysiologist:  None   Chief Complaint  Patient presents with  . Cardiac follow-up    History of Present Illness: Christopher Booth is a 79 y.o. male last assessed via telehealth encounter in June.  He presents for a follow-up visit.  From a cardiac perspective, he remains stable without active angina symptoms on medical therapy.  He has continued on aspirin, Lipitor, and Coreg.  I reviewed his interval lab work.  He did undergo lumbar spine surgery due to right-sided foot drop in August, subsequently right adrenalectomy in September, no obvious perioperative cardiac complications.  He has had a slow recovery, states that appetite is just now starting to return.  He is using a cane to ambulate.  He is now following with Dr. Wolfgang Phoenix for nephrology assessment.  Now back on eplerenone although at lower dose than previously.  I reviewed his ECG from August.  He did ask me about the status of his carotid arteries, Dopplers done prior to CABG in 2018 showed only mild bilateral ICA atherosclerosis.  Past Medical History:  Diagnosis Date  . Adrenal mass (HCC)    Right  . Arthritis   . 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 Dysrhythmia    afib after surgery  . Essential hypertension   . Gynecomastia   . Hyperaldosteronism   . Hypertension    Phreesia 07/21/2019  . Lumbar radiculopathy   . Myocardial infarction (HCC)    Phreesia 07/21/2019  . Pneumonia   . Postoperative atrial fibrillation (HCC)   . Pre-diabetes   . Spinal stenosis   . Temporomandibular joint disease   . Wears glasses     Past Surgical History:  Procedure Laterality Date  . ADRENAL GLAND SURGERY  10/17/2019  . CATARACT EXTRACTION W/PHACO Right 01/28/2015   Procedure: CATARACT EXTRACTION PHACO AND  INTRAOCULAR LENS PLACEMENT RIGHT EYE;  Surgeon: Susa Simmonds, MD;  Location: [facility];  Service: Ophthalmology;  Laterality: Right;  CDE:27.23  . CATARACT EXTRACTION W/PHACO Left 04/14/2015   Procedure: CATARACT EXTRACTION PHACO AND INTRAOCULAR LENS PLACEMENT LEFT EYE CDE=15.86;  Surgeon: Susa Simmonds, MD;  Location: [facility];  Service: Ophthalmology;  Laterality: Left;  . CERVICAL FUSION  1993  . CHOLECYSTECTOMY  1995  . COLONOSCOPY    . CORONARY ARTERY BYPASS GRAFT N/A 01/12/2017   Procedure: CORONARY ARTERY BYPASS GRAFTING (CABG);  Surgeon: Delight Ovens, MD;  Location[facility] OR;  Service: Open Heart Surgery;  Laterality: N/A;  Times 3 using left internal mammary artery to LAD and endoscopically harvested right thigh saphenous vein to OM and PD  . EYE SURGERY N/A    Phreesia 07/21/2019  . KNEE ARTHROSCOPY Right 10/09/2013   Procedure: RIGHT ARTHROSCOPY KNEE Partial medial and lateral meniscal tear and chondroplasty;  Surgeon: Velna Ochs, MD;  Location: [facility];  Service: Orthopedics;  Laterality: Right;  Partial medial and lateral meniscal tear and chondroplasty   . LEFT HEART CATH AND CORONARY ANGIOGRAPHY N/A 01/11/2017   Procedure: LEFT HEART CATH AND CORONARY ANGIOGRAPHY;  Surgeon: Corky Crafts, MD;  Location: [facility] INVASIVE CV LAB;  Service: Cardiovascular;  Laterality: N/A;  . LUMBAR LAMINECTOMY/DECOMPRESSION MICRODISCECTOMY Right 09/20/2019   Procedure: Right Lumbar four- Lumbar five Lumbar five Sacral one Laminectomy/Foraminotomy;  Surgeon: Barnett Abu,  MD;  Location: [place];  Service: Neurosurgery;  Laterality: Right;  . ROBOTIC ADRENALECTOMY Right 10/17/2019   Procedure: XI ROBOTIC ADRENALECTOMY;  Surgeon: Ralene Ok, MD;  Location: [place];  Service: General;  Laterality: Right;  . TEE WITHOUT CARDIOVERSION N/A 01/12/2017   Procedure: TRANSESOPHAGEAL ECHOCARDIOGRAM (TEE);  Surgeon: Grace Isaac, MD;  Location: [place];  Service: Open Heart  Surgery;  Laterality: N/A;  . TONSILLECTOMY    . VASECTOMY N/A    Phreesia 07/21/2019    Current Outpatient Medications  Medication Sig Dispense Refill  . acetaminophen (TYLENOL) 500 MG tablet Take 500 mg by mouth every 6 (six) hours as needed for mild pain.     Marland Kitchen aspirin 81 MG EC tablet Take 81 mg by mouth daily.    Marland Kitchen atorvastatin (LIPITOR) 20 MG tablet TAKE ONE TABLET BY MOUTH ONCE DAILY. 90 tablet 0  . carvedilol (COREG) 12.5 MG tablet Take 12.5 mg by mouth in the morning and at bedtime.    Marland Kitchen eplerenone (INSPRA) 25 MG tablet Take 25 mg by mouth daily.    . finasteride (PROSCAR) 5 MG tablet Take 5 mg by mouth daily.    . Multiple Vitamin (MULTIVITAMIN WITH MINERALS) TABS tablet Take 1 tablet by mouth daily.    . potassium chloride SA (KLOR-CON) 20 MEQ tablet Take 20 mEq by mouth daily.     No current facility-administered medications for this visit.   Allergies:  Diovan [valsartan]   ROS: No palpitations or syncope.  He has had trouble with intermittent constipation and loose stools.  Physical Exam: VS:  BP (!) 146/72   Pulse 76   Ht 5\' 6"  (1.676 m)   Wt 152 lb (68.9 kg)   SpO2 96%   BMI 24.53 kg/m , BMI Body mass index is 24.53 kg/m.  Wt Readings from Last 3 Encounters:  01/17/20 152 lb (68.9 kg)  10/17/19 166 lb 3.2 oz (75.4 kg)  10/12/19 166 lb 3.2 oz (75.4 kg)    General: Patient appears comfortable at rest. HEENT: Conjunctiva and lids normal, wearing a mask. Neck: Supple, no elevated JVP or carotid bruits. Cardiac: Regular rate and rhythm, no S3 or significant systolic murmur, no pericardial rub. Extremities: No pitting edema.  ECG:  An ECG dated 09/20/2019 was personally reviewed today and demonstrated:  Sinus rhythm with LVH and left anterior fascicular block.  Recent Labwork: 05/30/2019: ALT 47; AST 18 10/18/2019: Hemoglobin 15.0; Platelets 311 12/18/2019: BUN 15; Creatinine, Ser 0.92; Potassium 2.8; Sodium 141     Component Value Date/Time   CHOL 154  05/30/2019 0843   CHOL 153 11/16/2016 0739   TRIG 67 05/30/2019 0843   TRIG 126 11/16/2016 0739   HDL 52 05/30/2019 0843   HDL 47 11/16/2016 0739   CHOLHDL 3.0 05/30/2019 0843   VLDL 17 01/12/2017 0148   LDLCALC 87 05/30/2019 0843    Other Studies Reviewed Today:  Echocardiogram 01/11/2017: Study Conclusions  - Left ventricle: The cavity size was normal. Wall thickness was increased in a pattern of mild LVH. Systolic function was normal. The estimated ejection fraction was in the range of 60% to 65%. Wall motion was normal; there were no regional wall motion abnormalities. Doppler parameters are consistent with abnormal left ventricular relaxation (grade 1 diastolic dysfunction). The E/e&' ratio is between 8-15, suggesting indeterminate LV filling pressure. - Aortic valve: Sclerosis without stenosis. There was trivial regurgitation. - Aorta: Aortic root dimension: 41 mm (ED). - Aortic root: The aortic root is mildly dilated. -  Mitral valve: Calcified annulus. Mildly thickened leaflets . There was trivial regurgitation. - Left atrium: The atrium was normal in size. - Inferior vena cava: The vessel was normal in size. The respirophasic diameter changes were in the normal range (= 50%), consistent with normal central venous pressure.  Urgent and Critical Findings: A non-critical finding, normal LV function, was reported to [name]. Impressions:  - LVEF 60-65%, mild LVH, normal wall motion, grade 1 DD, indeterminate LV filling pressure, aortic sclerosis with trivial AI, mildly dilated aortic root to 4.1 cm, aortic valve sclerosis, trace central AI, trivial MR, normal LA size, normal IVC  Cardiac catheterization 01/11/2017:  Ost Cx to Prox Cx lesion is 90% stenosed.  Mid LM to Dist LM lesion is 50% stenosed.  Prox to mid LAD lesion is 70% stenosed. Eccentric, best seen in the RAO caudal view.  Ost LAD to Prox LAD lesion is 40%  stenosed.  Ost RCA to Prox RCA lesion is 80% stenosed.  Dist RCA lesion is 80% stenosed.  The left ventricular ejection fraction is 45-50% by visual estimate.  There is mild left ventricular systolic dysfunction.  LV end diastolic pressure is normal.  There is no aortic valve stenosis.  Multivessel disease with culprit lesion being heavily calcified distal left main lesion extending into the circumflex ostium.  Assessment and Plan:  1.  Multivessel CAD status post CABG in 2018.  He reports no active angina with typical ADLs and continues on aspirin, Coreg, and Lipitor.  Continue observation.  2.  Mild carotid atherosclerosis by pre-CABG Dopplers in 2018.  He is asymptomatic and remains on aspirin and statin.  3.  History of hypertension and hyperaldosteronism, now status post right adrenalectomy in September.  He is on low-dose eplerenone and is now following with Dr. Theador Hawthorne.  Medication Adjustments/Labs and Tests Ordered: Current medicines are reviewed at length with the patient today.  Concerns regarding medicines are outlined above.   Tests Ordered: No orders of the defined types were placed in this encounter.   Medication Changes: No orders of the defined types were placed in this encounter.   Disposition:  Follow up 6 months in the [place] office.  Signed, Satira Sark, MD, [facility] 01/17/2020 2:01 PM    [facility] at [facility] [address], [place], [place] [address] Phone: [phone]; Fax: [phone]

## 2020-01-17 ENCOUNTER — Ambulatory Visit: Payer: PPO | Admitting: Cardiology

## 2020-01-17 ENCOUNTER — Encounter: Payer: Self-pay | Admitting: Cardiology

## 2020-01-17 ENCOUNTER — Other Ambulatory Visit: Payer: Self-pay

## 2020-01-17 VITALS — BP 146/72 | HR 76 | Ht 66.0 in | Wt 152.0 lb

## 2020-01-17 DIAGNOSIS — M21371 Foot drop, right foot: Secondary | ICD-10-CM | POA: Diagnosis not present

## 2020-01-17 DIAGNOSIS — M5417 Radiculopathy, lumbosacral region: Secondary | ICD-10-CM | POA: Diagnosis not present

## 2020-01-17 DIAGNOSIS — I6523 Occlusion and stenosis of bilateral carotid arteries: Secondary | ICD-10-CM

## 2020-01-17 DIAGNOSIS — I25119 Atherosclerotic heart disease of native coronary artery with unspecified angina pectoris: Secondary | ICD-10-CM | POA: Diagnosis not present

## 2020-01-17 DIAGNOSIS — R2689 Other abnormalities of gait and mobility: Secondary | ICD-10-CM | POA: Diagnosis not present

## 2020-01-17 DIAGNOSIS — E269 Hyperaldosteronism, unspecified: Secondary | ICD-10-CM | POA: Diagnosis not present

## 2020-01-17 DIAGNOSIS — R531 Weakness: Secondary | ICD-10-CM | POA: Diagnosis not present

## 2020-01-17 NOTE — Patient Instructions (Signed)
Medication Instructions:  °Your physician recommends that you continue on your current medications as directed. Please refer to the Current Medication list given to you today. ° °*If you need a refill on your cardiac medications before your next appointment, please call your pharmacy* ° ° °Lab Work: °None today °If you have labs (blood work) drawn today and your tests are completely normal, you will receive your results only by: °• MyChart Message (if you have MyChart) OR °• A paper copy in the mail °If you have any lab test that is abnormal or we need to change your treatment, we will call you to review the results. ° ° °Testing/Procedures: °None today ° ° °Follow-Up: °At [facility], you and your health needs are our priority.  As part of our continuing mission to provide you with exceptional heart care, we have created designated Provider Care Teams.  These Care Teams include your primary Cardiologist (physician) and Advanced Practice Providers (APPs -  Physician Assistants and Nurse Practitioners) who all work together to provide you with the care you need, when you need it. ° °We recommend signing up for the patient portal called "MyChart".  Sign up information is provided on this After Visit Summary.  MyChart is used to connect with patients for Virtual Visits (Telemedicine).  Patients are able to view lab/test results, encounter notes, upcoming appointments, etc.  Non-urgent messages can be sent to your provider as well.   °To learn more about what you can do with MyChart, go to [email or web address].   ° °Your next appointment:   °6 month(s) ° °The format for your next appointment:   °In Person ° °Provider:   °Samuel McDowell, MD ° ° °Other Instructions °None ° ° ° ° °Thank you for choosing [facility] ! ° ° ° ° ° ° ° ° °

## 2020-01-22 DIAGNOSIS — M5417 Radiculopathy, lumbosacral region: Secondary | ICD-10-CM | POA: Diagnosis not present

## 2020-01-22 DIAGNOSIS — R2689 Other abnormalities of gait and mobility: Secondary | ICD-10-CM | POA: Diagnosis not present

## 2020-01-22 DIAGNOSIS — M21371 Foot drop, right foot: Secondary | ICD-10-CM | POA: Diagnosis not present

## 2020-01-22 DIAGNOSIS — R531 Weakness: Secondary | ICD-10-CM | POA: Diagnosis not present

## 2020-01-23 DIAGNOSIS — M5417 Radiculopathy, lumbosacral region: Secondary | ICD-10-CM | POA: Diagnosis not present

## 2020-01-23 DIAGNOSIS — M21371 Foot drop, right foot: Secondary | ICD-10-CM | POA: Diagnosis not present

## 2020-01-23 DIAGNOSIS — E876 Hypokalemia: Secondary | ICD-10-CM | POA: Diagnosis not present

## 2020-01-23 DIAGNOSIS — I5033 Acute on chronic diastolic (congestive) heart failure: Secondary | ICD-10-CM | POA: Diagnosis not present

## 2020-01-23 DIAGNOSIS — N182 Chronic kidney disease, stage 2 (mild): Secondary | ICD-10-CM | POA: Diagnosis not present

## 2020-01-23 DIAGNOSIS — R809 Proteinuria, unspecified: Secondary | ICD-10-CM | POA: Diagnosis not present

## 2020-01-23 DIAGNOSIS — E2609 Other primary hyperaldosteronism: Secondary | ICD-10-CM | POA: Diagnosis not present

## 2020-01-24 DIAGNOSIS — M5417 Radiculopathy, lumbosacral region: Secondary | ICD-10-CM | POA: Diagnosis not present

## 2020-01-24 DIAGNOSIS — R2689 Other abnormalities of gait and mobility: Secondary | ICD-10-CM | POA: Diagnosis not present

## 2020-01-24 DIAGNOSIS — R531 Weakness: Secondary | ICD-10-CM | POA: Diagnosis not present

## 2020-01-24 DIAGNOSIS — M21371 Foot drop, right foot: Secondary | ICD-10-CM | POA: Diagnosis not present

## 2020-01-28 DIAGNOSIS — M5417 Radiculopathy, lumbosacral region: Secondary | ICD-10-CM | POA: Diagnosis not present

## 2020-01-28 DIAGNOSIS — M21371 Foot drop, right foot: Secondary | ICD-10-CM | POA: Diagnosis not present

## 2020-01-28 DIAGNOSIS — R2689 Other abnormalities of gait and mobility: Secondary | ICD-10-CM | POA: Diagnosis not present

## 2020-01-28 DIAGNOSIS — R531 Weakness: Secondary | ICD-10-CM | POA: Diagnosis not present

## 2020-01-30 DIAGNOSIS — R531 Weakness: Secondary | ICD-10-CM | POA: Diagnosis not present

## 2020-01-30 DIAGNOSIS — M5417 Radiculopathy, lumbosacral region: Secondary | ICD-10-CM | POA: Diagnosis not present

## 2020-01-30 DIAGNOSIS — M21371 Foot drop, right foot: Secondary | ICD-10-CM | POA: Diagnosis not present

## 2020-01-30 DIAGNOSIS — R2689 Other abnormalities of gait and mobility: Secondary | ICD-10-CM | POA: Diagnosis not present

## 2020-02-01 DIAGNOSIS — M21371 Foot drop, right foot: Secondary | ICD-10-CM | POA: Diagnosis not present

## 2020-02-01 DIAGNOSIS — R2689 Other abnormalities of gait and mobility: Secondary | ICD-10-CM | POA: Diagnosis not present

## 2020-02-01 DIAGNOSIS — M5417 Radiculopathy, lumbosacral region: Secondary | ICD-10-CM | POA: Diagnosis not present

## 2020-02-01 DIAGNOSIS — R531 Weakness: Secondary | ICD-10-CM | POA: Diagnosis not present

## 2020-02-04 ENCOUNTER — Other Ambulatory Visit (HOSPITAL_COMMUNITY): Payer: Self-pay | Admitting: Nephrology

## 2020-02-04 ENCOUNTER — Encounter: Payer: Self-pay | Admitting: Nephrology

## 2020-02-04 DIAGNOSIS — M21371 Foot drop, right foot: Secondary | ICD-10-CM | POA: Diagnosis not present

## 2020-02-04 DIAGNOSIS — R2689 Other abnormalities of gait and mobility: Secondary | ICD-10-CM | POA: Diagnosis not present

## 2020-02-04 DIAGNOSIS — R531 Weakness: Secondary | ICD-10-CM | POA: Diagnosis not present

## 2020-02-04 DIAGNOSIS — R809 Proteinuria, unspecified: Secondary | ICD-10-CM

## 2020-02-04 DIAGNOSIS — M5417 Radiculopathy, lumbosacral region: Secondary | ICD-10-CM | POA: Diagnosis not present

## 2020-02-05 ENCOUNTER — Ambulatory Visit (HOSPITAL_COMMUNITY)
Admission: RE | Admit: 2020-02-05 | Discharge: 2020-02-05 | Disposition: A | Discharge status: Home / Self Care | Payer: PPO | Source: Ambulatory Visit | Attending: Cardiology | Admitting: Cardiology

## 2020-02-05 ENCOUNTER — Other Ambulatory Visit: Payer: Self-pay

## 2020-02-05 DIAGNOSIS — R809 Proteinuria, unspecified: Secondary | ICD-10-CM

## 2020-02-05 DIAGNOSIS — I361 Nonrheumatic tricuspid (valve) insufficiency: Secondary | ICD-10-CM

## 2020-02-05 DIAGNOSIS — E785 Hyperlipidemia, unspecified: Secondary | ICD-10-CM | POA: Insufficient documentation

## 2020-02-05 DIAGNOSIS — Z951 Presence of aortocoronary bypass graft: Secondary | ICD-10-CM | POA: Diagnosis not present

## 2020-02-05 DIAGNOSIS — I10 Essential (primary) hypertension: Secondary | ICD-10-CM | POA: Diagnosis not present

## 2020-02-05 DIAGNOSIS — I351 Nonrheumatic aortic (valve) insufficiency: Secondary | ICD-10-CM

## 2020-02-05 DIAGNOSIS — I2510 Atherosclerotic heart disease of native coronary artery without angina pectoris: Secondary | ICD-10-CM | POA: Insufficient documentation

## 2020-02-05 DIAGNOSIS — I252 Old myocardial infarction: Secondary | ICD-10-CM | POA: Diagnosis not present

## 2020-02-05 DIAGNOSIS — I340 Nonrheumatic mitral (valve) insufficiency: Secondary | ICD-10-CM | POA: Diagnosis not present

## 2020-02-05 LAB — ECHOCARDIOGRAM COMPLETE
AR max vel: 2.76 cm2
AV Area VTI: 2.75 cm2
AV Area mean vel: 2.23 cm2
AV Mean grad: 3.9 mmHg
AV Peak grad: 7 mmHg
Ao pk vel: 1.32 m/s
Area-P 1/2: 3.17 cm2
P 1/2 time: 625 msec
S' Lateral: 3.4 cm

## 2020-02-05 NOTE — Progress Notes (Signed)
*  PRELIMINARY RESULTS* Echocardiogram 2D Echocardiogram has been performed.  Christopher Booth 02/05/2020, 10:09 AM

## 2020-02-06 ENCOUNTER — Other Ambulatory Visit (HOSPITAL_COMMUNITY)
Admission: RE | Admit: 2020-02-06 | Discharge: 2020-02-06 | Disposition: A | Discharge status: Home / Self Care | Payer: PPO | Source: Ambulatory Visit | Attending: Nephrology | Admitting: Nephrology

## 2020-02-06 DIAGNOSIS — E2609 Other primary hyperaldosteronism: Secondary | ICD-10-CM | POA: Insufficient documentation

## 2020-02-06 DIAGNOSIS — E876 Hypokalemia: Secondary | ICD-10-CM | POA: Insufficient documentation

## 2020-02-06 DIAGNOSIS — R2689 Other abnormalities of gait and mobility: Secondary | ICD-10-CM | POA: Diagnosis not present

## 2020-02-06 DIAGNOSIS — N182 Chronic kidney disease, stage 2 (mild): Secondary | ICD-10-CM | POA: Diagnosis not present

## 2020-02-06 DIAGNOSIS — R809 Proteinuria, unspecified: Secondary | ICD-10-CM | POA: Insufficient documentation

## 2020-02-06 DIAGNOSIS — K5909 Other constipation: Secondary | ICD-10-CM | POA: Diagnosis not present

## 2020-02-06 DIAGNOSIS — M21371 Foot drop, right foot: Secondary | ICD-10-CM | POA: Diagnosis not present

## 2020-02-06 DIAGNOSIS — I517 Cardiomegaly: Secondary | ICD-10-CM | POA: Diagnosis not present

## 2020-02-06 DIAGNOSIS — M5417 Radiculopathy, lumbosacral region: Secondary | ICD-10-CM | POA: Diagnosis not present

## 2020-02-06 DIAGNOSIS — I129 Hypertensive chronic kidney disease with stage 1 through stage 4 chronic kidney disease, or unspecified chronic kidney disease: Secondary | ICD-10-CM | POA: Diagnosis not present

## 2020-02-06 DIAGNOSIS — R531 Weakness: Secondary | ICD-10-CM | POA: Diagnosis not present

## 2020-02-06 LAB — RENAL FUNCTION PANEL
Albumin: 3.7 g/dL (ref 3.5–5.0)
Anion gap: 9 (ref 5–15)
BUN: 18 mg/dL (ref 8–23)
CO2: 34 mmol/L — ABNORMAL HIGH (ref 22–32)
Calcium: 9.3 mg/dL (ref 8.9–10.3)
Chloride: 96 mmol/L — ABNORMAL LOW (ref 98–111)
Creatinine, Ser: 0.82 mg/dL (ref 0.61–1.24)
GFR, Estimated: >60 mL/min (ref >60)
Glucose, Bld: 167 mg/dL — ABNORMAL HIGH (ref 70–99)
Phosphorus: 3.2 mg/dL (ref 2.5–4.6)
Potassium: 2.7 mmol/L — CL (ref 3.5–5.1)
Sodium: 139 mmol/L (ref 135–145)

## 2020-02-07 ENCOUNTER — Other Ambulatory Visit: Payer: Self-pay

## 2020-02-07 ENCOUNTER — Encounter (HOSPITAL_COMMUNITY)
Admission: RE | Admit: 2020-02-07 | Discharge: 2020-02-07 | Disposition: A | Discharge status: Home / Self Care | Payer: PPO | Source: Ambulatory Visit | Attending: Nephrology | Admitting: Nephrology

## 2020-02-07 DIAGNOSIS — E876 Hypokalemia: Secondary | ICD-10-CM | POA: Diagnosis not present

## 2020-02-07 MED ORDER — SODIUM CHLORIDE 0.9 % IV SOLN: INTRAVENOUS | Status: DC

## 2020-02-07 MED ORDER — POTASSIUM CHLORIDE 10 MEQ/100ML IV SOLN
10.0000 meq | INTRAVENOUS | Status: AC
  Administered 2020-02-07 (×3): 10 meq via INTRAVENOUS
  Filled 2020-02-07: qty 100

## 2020-02-08 ENCOUNTER — Other Ambulatory Visit (HOSPITAL_COMMUNITY)
Admission: RE | Admit: 2020-02-08 | Discharge: 2020-02-08 | Disposition: A | Discharge status: Home / Self Care | Payer: PPO | Source: Ambulatory Visit | Attending: Nephrology | Admitting: Nephrology

## 2020-02-08 ENCOUNTER — Other Ambulatory Visit: Payer: Self-pay

## 2020-02-08 DIAGNOSIS — M21371 Foot drop, right foot: Secondary | ICD-10-CM | POA: Diagnosis not present

## 2020-02-08 DIAGNOSIS — E2609 Other primary hyperaldosteronism: Secondary | ICD-10-CM | POA: Insufficient documentation

## 2020-02-08 DIAGNOSIS — I129 Hypertensive chronic kidney disease with stage 1 through stage 4 chronic kidney disease, or unspecified chronic kidney disease: Secondary | ICD-10-CM | POA: Insufficient documentation

## 2020-02-08 DIAGNOSIS — R2689 Other abnormalities of gait and mobility: Secondary | ICD-10-CM | POA: Diagnosis not present

## 2020-02-08 DIAGNOSIS — E876 Hypokalemia: Secondary | ICD-10-CM | POA: Insufficient documentation

## 2020-02-08 DIAGNOSIS — R531 Weakness: Secondary | ICD-10-CM | POA: Diagnosis not present

## 2020-02-08 DIAGNOSIS — R809 Proteinuria, unspecified: Secondary | ICD-10-CM | POA: Diagnosis not present

## 2020-02-08 DIAGNOSIS — M5417 Radiculopathy, lumbosacral region: Secondary | ICD-10-CM | POA: Diagnosis not present

## 2020-02-08 LAB — POTASSIUM: Potassium: 3.3 mmol/L — ABNORMAL LOW (ref 3.5–5.1)

## 2020-02-12 DIAGNOSIS — E2609 Other primary hyperaldosteronism: Secondary | ICD-10-CM | POA: Diagnosis not present

## 2020-02-12 DIAGNOSIS — R809 Proteinuria, unspecified: Secondary | ICD-10-CM | POA: Diagnosis not present

## 2020-02-12 DIAGNOSIS — I5032 Chronic diastolic (congestive) heart failure: Secondary | ICD-10-CM | POA: Diagnosis not present

## 2020-02-12 DIAGNOSIS — E876 Hypokalemia: Secondary | ICD-10-CM | POA: Diagnosis not present

## 2020-02-12 DIAGNOSIS — I129 Hypertensive chronic kidney disease with stage 1 through stage 4 chronic kidney disease, or unspecified chronic kidney disease: Secondary | ICD-10-CM | POA: Diagnosis not present

## 2020-02-13 DIAGNOSIS — M21371 Foot drop, right foot: Secondary | ICD-10-CM | POA: Diagnosis not present

## 2020-02-13 DIAGNOSIS — N182 Chronic kidney disease, stage 2 (mild): Secondary | ICD-10-CM | POA: Diagnosis not present

## 2020-02-13 DIAGNOSIS — R2689 Other abnormalities of gait and mobility: Secondary | ICD-10-CM | POA: Diagnosis not present

## 2020-02-13 DIAGNOSIS — I129 Hypertensive chronic kidney disease with stage 1 through stage 4 chronic kidney disease, or unspecified chronic kidney disease: Secondary | ICD-10-CM | POA: Diagnosis not present

## 2020-02-13 DIAGNOSIS — R809 Proteinuria, unspecified: Secondary | ICD-10-CM | POA: Diagnosis not present

## 2020-02-13 DIAGNOSIS — R531 Weakness: Secondary | ICD-10-CM | POA: Diagnosis not present

## 2020-02-13 DIAGNOSIS — M5417 Radiculopathy, lumbosacral region: Secondary | ICD-10-CM | POA: Diagnosis not present

## 2020-02-13 DIAGNOSIS — E876 Hypokalemia: Secondary | ICD-10-CM | POA: Diagnosis not present

## 2020-02-13 DIAGNOSIS — E2609 Other primary hyperaldosteronism: Secondary | ICD-10-CM | POA: Diagnosis not present

## 2020-02-15 DIAGNOSIS — R2689 Other abnormalities of gait and mobility: Secondary | ICD-10-CM | POA: Diagnosis not present

## 2020-02-15 DIAGNOSIS — M21371 Foot drop, right foot: Secondary | ICD-10-CM | POA: Diagnosis not present

## 2020-02-15 DIAGNOSIS — R531 Weakness: Secondary | ICD-10-CM | POA: Diagnosis not present

## 2020-02-15 DIAGNOSIS — M5417 Radiculopathy, lumbosacral region: Secondary | ICD-10-CM | POA: Diagnosis not present

## 2020-02-18 DIAGNOSIS — M5417 Radiculopathy, lumbosacral region: Secondary | ICD-10-CM | POA: Diagnosis not present

## 2020-02-18 DIAGNOSIS — M21371 Foot drop, right foot: Secondary | ICD-10-CM | POA: Diagnosis not present

## 2020-02-18 DIAGNOSIS — R2689 Other abnormalities of gait and mobility: Secondary | ICD-10-CM | POA: Diagnosis not present

## 2020-02-18 DIAGNOSIS — R531 Weakness: Secondary | ICD-10-CM | POA: Diagnosis not present

## 2020-02-20 DIAGNOSIS — M5417 Radiculopathy, lumbosacral region: Secondary | ICD-10-CM | POA: Diagnosis not present

## 2020-02-20 DIAGNOSIS — R531 Weakness: Secondary | ICD-10-CM | POA: Diagnosis not present

## 2020-02-20 DIAGNOSIS — M21371 Foot drop, right foot: Secondary | ICD-10-CM | POA: Diagnosis not present

## 2020-02-20 DIAGNOSIS — R2689 Other abnormalities of gait and mobility: Secondary | ICD-10-CM | POA: Diagnosis not present

## 2020-02-22 DIAGNOSIS — R2689 Other abnormalities of gait and mobility: Secondary | ICD-10-CM | POA: Diagnosis not present

## 2020-02-22 DIAGNOSIS — M21371 Foot drop, right foot: Secondary | ICD-10-CM | POA: Diagnosis not present

## 2020-02-22 DIAGNOSIS — R531 Weakness: Secondary | ICD-10-CM | POA: Diagnosis not present

## 2020-02-22 DIAGNOSIS — M5417 Radiculopathy, lumbosacral region: Secondary | ICD-10-CM | POA: Diagnosis not present

## 2020-02-25 DIAGNOSIS — R2689 Other abnormalities of gait and mobility: Secondary | ICD-10-CM | POA: Diagnosis not present

## 2020-02-25 DIAGNOSIS — M21371 Foot drop, right foot: Secondary | ICD-10-CM | POA: Diagnosis not present

## 2020-02-25 DIAGNOSIS — M5417 Radiculopathy, lumbosacral region: Secondary | ICD-10-CM | POA: Diagnosis not present

## 2020-02-25 DIAGNOSIS — R531 Weakness: Secondary | ICD-10-CM | POA: Diagnosis not present

## 2020-02-26 DIAGNOSIS — E2609 Other primary hyperaldosteronism: Secondary | ICD-10-CM | POA: Diagnosis not present

## 2020-02-26 DIAGNOSIS — E876 Hypokalemia: Secondary | ICD-10-CM | POA: Diagnosis not present

## 2020-02-26 DIAGNOSIS — I129 Hypertensive chronic kidney disease with stage 1 through stage 4 chronic kidney disease, or unspecified chronic kidney disease: Secondary | ICD-10-CM | POA: Diagnosis not present

## 2020-02-26 DIAGNOSIS — R809 Proteinuria, unspecified: Secondary | ICD-10-CM | POA: Diagnosis not present

## 2020-02-26 DIAGNOSIS — N182 Chronic kidney disease, stage 2 (mild): Secondary | ICD-10-CM | POA: Diagnosis not present

## 2020-02-27 DIAGNOSIS — M5417 Radiculopathy, lumbosacral region: Secondary | ICD-10-CM | POA: Diagnosis not present

## 2020-02-27 DIAGNOSIS — R2689 Other abnormalities of gait and mobility: Secondary | ICD-10-CM | POA: Diagnosis not present

## 2020-02-27 DIAGNOSIS — R531 Weakness: Secondary | ICD-10-CM | POA: Diagnosis not present

## 2020-02-27 DIAGNOSIS — N182 Chronic kidney disease, stage 2 (mild): Secondary | ICD-10-CM | POA: Diagnosis not present

## 2020-02-27 DIAGNOSIS — R809 Proteinuria, unspecified: Secondary | ICD-10-CM | POA: Diagnosis not present

## 2020-02-27 DIAGNOSIS — E2609 Other primary hyperaldosteronism: Secondary | ICD-10-CM | POA: Diagnosis not present

## 2020-02-27 DIAGNOSIS — E876 Hypokalemia: Secondary | ICD-10-CM | POA: Diagnosis not present

## 2020-02-27 DIAGNOSIS — M21371 Foot drop, right foot: Secondary | ICD-10-CM | POA: Diagnosis not present

## 2020-02-27 DIAGNOSIS — I129 Hypertensive chronic kidney disease with stage 1 through stage 4 chronic kidney disease, or unspecified chronic kidney disease: Secondary | ICD-10-CM | POA: Diagnosis not present

## 2020-02-29 DIAGNOSIS — M21371 Foot drop, right foot: Secondary | ICD-10-CM | POA: Diagnosis not present

## 2020-02-29 DIAGNOSIS — M5417 Radiculopathy, lumbosacral region: Secondary | ICD-10-CM | POA: Diagnosis not present

## 2020-02-29 DIAGNOSIS — R2689 Other abnormalities of gait and mobility: Secondary | ICD-10-CM | POA: Diagnosis not present

## 2020-02-29 DIAGNOSIS — R531 Weakness: Secondary | ICD-10-CM | POA: Diagnosis not present

## 2020-02-29 DIAGNOSIS — M25831 Other specified joint disorders, right wrist: Secondary | ICD-10-CM | POA: Diagnosis not present

## 2020-02-29 DIAGNOSIS — M1811 Unilateral primary osteoarthritis of first carpometacarpal joint, right hand: Secondary | ICD-10-CM | POA: Diagnosis not present

## 2020-03-03 DIAGNOSIS — R2689 Other abnormalities of gait and mobility: Secondary | ICD-10-CM | POA: Diagnosis not present

## 2020-03-03 DIAGNOSIS — M5417 Radiculopathy, lumbosacral region: Secondary | ICD-10-CM | POA: Diagnosis not present

## 2020-03-03 DIAGNOSIS — R531 Weakness: Secondary | ICD-10-CM | POA: Diagnosis not present

## 2020-03-03 DIAGNOSIS — M21371 Foot drop, right foot: Secondary | ICD-10-CM | POA: Diagnosis not present

## 2020-03-05 DIAGNOSIS — M5417 Radiculopathy, lumbosacral region: Secondary | ICD-10-CM | POA: Diagnosis not present

## 2020-03-05 DIAGNOSIS — M21371 Foot drop, right foot: Secondary | ICD-10-CM | POA: Diagnosis not present

## 2020-03-05 DIAGNOSIS — R2689 Other abnormalities of gait and mobility: Secondary | ICD-10-CM | POA: Diagnosis not present

## 2020-03-05 DIAGNOSIS — R531 Weakness: Secondary | ICD-10-CM | POA: Diagnosis not present

## 2020-03-07 DIAGNOSIS — M5416 Radiculopathy, lumbar region: Secondary | ICD-10-CM | POA: Diagnosis not present

## 2020-03-07 DIAGNOSIS — I10 Essential (primary) hypertension: Secondary | ICD-10-CM | POA: Diagnosis not present

## 2020-03-10 DIAGNOSIS — M5417 Radiculopathy, lumbosacral region: Secondary | ICD-10-CM | POA: Diagnosis not present

## 2020-03-10 DIAGNOSIS — R531 Weakness: Secondary | ICD-10-CM | POA: Diagnosis not present

## 2020-03-10 DIAGNOSIS — M21371 Foot drop, right foot: Secondary | ICD-10-CM | POA: Diagnosis not present

## 2020-03-10 DIAGNOSIS — R2689 Other abnormalities of gait and mobility: Secondary | ICD-10-CM | POA: Diagnosis not present

## 2020-03-12 DIAGNOSIS — R531 Weakness: Secondary | ICD-10-CM | POA: Diagnosis not present

## 2020-03-12 DIAGNOSIS — I129 Hypertensive chronic kidney disease with stage 1 through stage 4 chronic kidney disease, or unspecified chronic kidney disease: Secondary | ICD-10-CM | POA: Diagnosis not present

## 2020-03-12 DIAGNOSIS — E2609 Other primary hyperaldosteronism: Secondary | ICD-10-CM | POA: Diagnosis not present

## 2020-03-12 DIAGNOSIS — M5417 Radiculopathy, lumbosacral region: Secondary | ICD-10-CM | POA: Diagnosis not present

## 2020-03-12 DIAGNOSIS — E876 Hypokalemia: Secondary | ICD-10-CM | POA: Diagnosis not present

## 2020-03-12 DIAGNOSIS — R809 Proteinuria, unspecified: Secondary | ICD-10-CM | POA: Diagnosis not present

## 2020-03-12 DIAGNOSIS — M21371 Foot drop, right foot: Secondary | ICD-10-CM | POA: Diagnosis not present

## 2020-03-12 DIAGNOSIS — R2689 Other abnormalities of gait and mobility: Secondary | ICD-10-CM | POA: Diagnosis not present

## 2020-03-12 DIAGNOSIS — N182 Chronic kidney disease, stage 2 (mild): Secondary | ICD-10-CM | POA: Diagnosis not present

## 2020-03-14 DIAGNOSIS — R2689 Other abnormalities of gait and mobility: Secondary | ICD-10-CM | POA: Diagnosis not present

## 2020-03-14 DIAGNOSIS — R531 Weakness: Secondary | ICD-10-CM | POA: Diagnosis not present

## 2020-03-14 DIAGNOSIS — M21371 Foot drop, right foot: Secondary | ICD-10-CM | POA: Diagnosis not present

## 2020-03-14 DIAGNOSIS — M5417 Radiculopathy, lumbosacral region: Secondary | ICD-10-CM | POA: Diagnosis not present

## 2020-03-17 DIAGNOSIS — M5417 Radiculopathy, lumbosacral region: Secondary | ICD-10-CM | POA: Diagnosis not present

## 2020-03-17 DIAGNOSIS — R531 Weakness: Secondary | ICD-10-CM | POA: Diagnosis not present

## 2020-03-17 DIAGNOSIS — M21371 Foot drop, right foot: Secondary | ICD-10-CM | POA: Diagnosis not present

## 2020-03-17 DIAGNOSIS — R2689 Other abnormalities of gait and mobility: Secondary | ICD-10-CM | POA: Diagnosis not present

## 2020-03-19 DIAGNOSIS — M21371 Foot drop, right foot: Secondary | ICD-10-CM | POA: Diagnosis not present

## 2020-03-19 DIAGNOSIS — M5417 Radiculopathy, lumbosacral region: Secondary | ICD-10-CM | POA: Diagnosis not present

## 2020-03-19 DIAGNOSIS — R531 Weakness: Secondary | ICD-10-CM | POA: Diagnosis not present

## 2020-03-19 DIAGNOSIS — R2689 Other abnormalities of gait and mobility: Secondary | ICD-10-CM | POA: Diagnosis not present

## 2020-03-21 DIAGNOSIS — M5417 Radiculopathy, lumbosacral region: Secondary | ICD-10-CM | POA: Diagnosis not present

## 2020-03-21 DIAGNOSIS — M21371 Foot drop, right foot: Secondary | ICD-10-CM | POA: Diagnosis not present

## 2020-03-21 DIAGNOSIS — R2689 Other abnormalities of gait and mobility: Secondary | ICD-10-CM | POA: Diagnosis not present

## 2020-03-21 DIAGNOSIS — R531 Weakness: Secondary | ICD-10-CM | POA: Diagnosis not present

## 2020-03-24 DIAGNOSIS — M5417 Radiculopathy, lumbosacral region: Secondary | ICD-10-CM | POA: Diagnosis not present

## 2020-03-24 DIAGNOSIS — M21371 Foot drop, right foot: Secondary | ICD-10-CM | POA: Diagnosis not present

## 2020-03-25 DIAGNOSIS — M5417 Radiculopathy, lumbosacral region: Secondary | ICD-10-CM | POA: Diagnosis not present

## 2020-03-25 DIAGNOSIS — M21371 Foot drop, right foot: Secondary | ICD-10-CM | POA: Diagnosis not present

## 2020-03-25 DIAGNOSIS — R2689 Other abnormalities of gait and mobility: Secondary | ICD-10-CM | POA: Diagnosis not present

## 2020-03-25 DIAGNOSIS — R531 Weakness: Secondary | ICD-10-CM | POA: Diagnosis not present

## 2020-03-27 DIAGNOSIS — R531 Weakness: Secondary | ICD-10-CM | POA: Diagnosis not present

## 2020-03-27 DIAGNOSIS — M21371 Foot drop, right foot: Secondary | ICD-10-CM | POA: Diagnosis not present

## 2020-03-27 DIAGNOSIS — R2689 Other abnormalities of gait and mobility: Secondary | ICD-10-CM | POA: Diagnosis not present

## 2020-03-27 DIAGNOSIS — M5417 Radiculopathy, lumbosacral region: Secondary | ICD-10-CM | POA: Diagnosis not present

## 2020-03-31 DIAGNOSIS — R2689 Other abnormalities of gait and mobility: Secondary | ICD-10-CM | POA: Diagnosis not present

## 2020-03-31 DIAGNOSIS — R531 Weakness: Secondary | ICD-10-CM | POA: Diagnosis not present

## 2020-03-31 DIAGNOSIS — M5417 Radiculopathy, lumbosacral region: Secondary | ICD-10-CM | POA: Diagnosis not present

## 2020-03-31 DIAGNOSIS — M21371 Foot drop, right foot: Secondary | ICD-10-CM | POA: Diagnosis not present

## 2020-04-02 DIAGNOSIS — R531 Weakness: Secondary | ICD-10-CM | POA: Diagnosis not present

## 2020-04-02 DIAGNOSIS — M5417 Radiculopathy, lumbosacral region: Secondary | ICD-10-CM | POA: Diagnosis not present

## 2020-04-02 DIAGNOSIS — M21371 Foot drop, right foot: Secondary | ICD-10-CM | POA: Diagnosis not present

## 2020-04-02 DIAGNOSIS — R2689 Other abnormalities of gait and mobility: Secondary | ICD-10-CM | POA: Diagnosis not present

## 2020-04-03 ENCOUNTER — Other Ambulatory Visit: Payer: Self-pay | Admitting: Family Medicine

## 2020-04-03 DIAGNOSIS — E782 Mixed hyperlipidemia: Secondary | ICD-10-CM

## 2020-04-09 DIAGNOSIS — M5417 Radiculopathy, lumbosacral region: Secondary | ICD-10-CM | POA: Diagnosis not present

## 2020-04-09 DIAGNOSIS — R531 Weakness: Secondary | ICD-10-CM | POA: Diagnosis not present

## 2020-04-09 DIAGNOSIS — R2689 Other abnormalities of gait and mobility: Secondary | ICD-10-CM | POA: Diagnosis not present

## 2020-04-09 DIAGNOSIS — E876 Hypokalemia: Secondary | ICD-10-CM | POA: Diagnosis not present

## 2020-04-09 DIAGNOSIS — E896 Postprocedural adrenocortical (-medullary) hypofunction: Secondary | ICD-10-CM | POA: Diagnosis not present

## 2020-04-09 DIAGNOSIS — M21371 Foot drop, right foot: Secondary | ICD-10-CM | POA: Diagnosis not present

## 2020-04-15 DIAGNOSIS — R531 Weakness: Secondary | ICD-10-CM | POA: Diagnosis not present

## 2020-04-15 DIAGNOSIS — M21371 Foot drop, right foot: Secondary | ICD-10-CM | POA: Diagnosis not present

## 2020-04-15 DIAGNOSIS — M5417 Radiculopathy, lumbosacral region: Secondary | ICD-10-CM | POA: Diagnosis not present

## 2020-04-15 DIAGNOSIS — R2689 Other abnormalities of gait and mobility: Secondary | ICD-10-CM | POA: Diagnosis not present

## 2020-04-17 DIAGNOSIS — R531 Weakness: Secondary | ICD-10-CM | POA: Diagnosis not present

## 2020-04-17 DIAGNOSIS — M21371 Foot drop, right foot: Secondary | ICD-10-CM | POA: Diagnosis not present

## 2020-04-17 DIAGNOSIS — M5417 Radiculopathy, lumbosacral region: Secondary | ICD-10-CM | POA: Diagnosis not present

## 2020-04-17 DIAGNOSIS — R2689 Other abnormalities of gait and mobility: Secondary | ICD-10-CM | POA: Diagnosis not present

## 2020-04-21 DIAGNOSIS — R2689 Other abnormalities of gait and mobility: Secondary | ICD-10-CM | POA: Diagnosis not present

## 2020-04-21 DIAGNOSIS — M5417 Radiculopathy, lumbosacral region: Secondary | ICD-10-CM | POA: Diagnosis not present

## 2020-04-21 DIAGNOSIS — R531 Weakness: Secondary | ICD-10-CM | POA: Diagnosis not present

## 2020-04-21 DIAGNOSIS — M21371 Foot drop, right foot: Secondary | ICD-10-CM | POA: Diagnosis not present

## 2020-04-23 DIAGNOSIS — R2689 Other abnormalities of gait and mobility: Secondary | ICD-10-CM | POA: Diagnosis not present

## 2020-04-23 DIAGNOSIS — R531 Weakness: Secondary | ICD-10-CM | POA: Diagnosis not present

## 2020-04-23 DIAGNOSIS — M5417 Radiculopathy, lumbosacral region: Secondary | ICD-10-CM | POA: Diagnosis not present

## 2020-04-23 DIAGNOSIS — M21371 Foot drop, right foot: Secondary | ICD-10-CM | POA: Diagnosis not present

## 2020-04-25 DIAGNOSIS — M21371 Foot drop, right foot: Secondary | ICD-10-CM | POA: Diagnosis not present

## 2020-04-25 DIAGNOSIS — R2689 Other abnormalities of gait and mobility: Secondary | ICD-10-CM | POA: Diagnosis not present

## 2020-04-25 DIAGNOSIS — M5417 Radiculopathy, lumbosacral region: Secondary | ICD-10-CM | POA: Diagnosis not present

## 2020-04-25 DIAGNOSIS — R531 Weakness: Secondary | ICD-10-CM | POA: Diagnosis not present

## 2020-04-28 DIAGNOSIS — R809 Proteinuria, unspecified: Secondary | ICD-10-CM | POA: Diagnosis not present

## 2020-04-28 DIAGNOSIS — E876 Hypokalemia: Secondary | ICD-10-CM | POA: Diagnosis not present

## 2020-04-28 DIAGNOSIS — I10 Essential (primary) hypertension: Secondary | ICD-10-CM | POA: Diagnosis not present

## 2020-04-28 DIAGNOSIS — Z79899 Other long term (current) drug therapy: Secondary | ICD-10-CM | POA: Diagnosis not present

## 2020-04-29 DIAGNOSIS — M5417 Radiculopathy, lumbosacral region: Secondary | ICD-10-CM | POA: Diagnosis not present

## 2020-04-29 DIAGNOSIS — R2689 Other abnormalities of gait and mobility: Secondary | ICD-10-CM | POA: Diagnosis not present

## 2020-04-29 DIAGNOSIS — R531 Weakness: Secondary | ICD-10-CM | POA: Diagnosis not present

## 2020-04-29 DIAGNOSIS — M21371 Foot drop, right foot: Secondary | ICD-10-CM | POA: Diagnosis not present

## 2020-04-30 DIAGNOSIS — E876 Hypokalemia: Secondary | ICD-10-CM | POA: Diagnosis not present

## 2020-04-30 DIAGNOSIS — I129 Hypertensive chronic kidney disease with stage 1 through stage 4 chronic kidney disease, or unspecified chronic kidney disease: Secondary | ICD-10-CM | POA: Diagnosis not present

## 2020-04-30 DIAGNOSIS — I5032 Chronic diastolic (congestive) heart failure: Secondary | ICD-10-CM | POA: Diagnosis not present

## 2020-04-30 DIAGNOSIS — R809 Proteinuria, unspecified: Secondary | ICD-10-CM | POA: Diagnosis not present

## 2020-04-30 DIAGNOSIS — E2609 Other primary hyperaldosteronism: Secondary | ICD-10-CM | POA: Diagnosis not present

## 2020-04-30 DIAGNOSIS — N182 Chronic kidney disease, stage 2 (mild): Secondary | ICD-10-CM | POA: Diagnosis not present

## 2020-04-30 DIAGNOSIS — R1909 Other intra-abdominal and pelvic swelling, mass and lump: Secondary | ICD-10-CM | POA: Diagnosis not present

## 2020-05-01 DIAGNOSIS — M5417 Radiculopathy, lumbosacral region: Secondary | ICD-10-CM | POA: Diagnosis not present

## 2020-05-01 DIAGNOSIS — R531 Weakness: Secondary | ICD-10-CM | POA: Diagnosis not present

## 2020-05-01 DIAGNOSIS — R2689 Other abnormalities of gait and mobility: Secondary | ICD-10-CM | POA: Diagnosis not present

## 2020-05-01 DIAGNOSIS — M21371 Foot drop, right foot: Secondary | ICD-10-CM | POA: Diagnosis not present

## 2020-05-05 DIAGNOSIS — R531 Weakness: Secondary | ICD-10-CM | POA: Diagnosis not present

## 2020-05-05 DIAGNOSIS — M21371 Foot drop, right foot: Secondary | ICD-10-CM | POA: Diagnosis not present

## 2020-05-05 DIAGNOSIS — R2689 Other abnormalities of gait and mobility: Secondary | ICD-10-CM | POA: Diagnosis not present

## 2020-05-05 DIAGNOSIS — M5417 Radiculopathy, lumbosacral region: Secondary | ICD-10-CM | POA: Diagnosis not present

## 2020-05-07 DIAGNOSIS — M21371 Foot drop, right foot: Secondary | ICD-10-CM | POA: Diagnosis not present

## 2020-05-07 DIAGNOSIS — M5417 Radiculopathy, lumbosacral region: Secondary | ICD-10-CM | POA: Diagnosis not present

## 2020-05-07 DIAGNOSIS — I509 Heart failure, unspecified: Secondary | ICD-10-CM | POA: Diagnosis not present

## 2020-05-07 DIAGNOSIS — R531 Weakness: Secondary | ICD-10-CM | POA: Diagnosis not present

## 2020-05-07 DIAGNOSIS — I4891 Unspecified atrial fibrillation: Secondary | ICD-10-CM | POA: Diagnosis not present

## 2020-05-07 DIAGNOSIS — Z9889 Other specified postprocedural states: Secondary | ICD-10-CM | POA: Diagnosis not present

## 2020-05-07 DIAGNOSIS — E261 Secondary hyperaldosteronism: Secondary | ICD-10-CM | POA: Diagnosis not present

## 2020-05-07 DIAGNOSIS — R2689 Other abnormalities of gait and mobility: Secondary | ICD-10-CM | POA: Diagnosis not present

## 2020-05-07 DIAGNOSIS — Z8739 Personal history of other diseases of the musculoskeletal system and connective tissue: Secondary | ICD-10-CM | POA: Diagnosis not present

## 2020-05-07 DIAGNOSIS — Z6823 Body mass index (BMI) 23.0-23.9, adult: Secondary | ICD-10-CM | POA: Diagnosis not present

## 2020-05-07 DIAGNOSIS — E441 Mild protein-calorie malnutrition: Secondary | ICD-10-CM | POA: Diagnosis not present

## 2020-05-07 DIAGNOSIS — I110 Hypertensive heart disease with heart failure: Secondary | ICD-10-CM | POA: Diagnosis not present

## 2020-05-07 DIAGNOSIS — E896 Postprocedural adrenocortical (-medullary) hypofunction: Secondary | ICD-10-CM | POA: Diagnosis not present

## 2020-05-09 DIAGNOSIS — M21371 Foot drop, right foot: Secondary | ICD-10-CM | POA: Diagnosis not present

## 2020-05-09 DIAGNOSIS — M5417 Radiculopathy, lumbosacral region: Secondary | ICD-10-CM | POA: Diagnosis not present

## 2020-05-09 DIAGNOSIS — R2689 Other abnormalities of gait and mobility: Secondary | ICD-10-CM | POA: Diagnosis not present

## 2020-05-09 DIAGNOSIS — R531 Weakness: Secondary | ICD-10-CM | POA: Diagnosis not present

## 2020-05-12 DIAGNOSIS — R531 Weakness: Secondary | ICD-10-CM | POA: Diagnosis not present

## 2020-05-12 DIAGNOSIS — M5417 Radiculopathy, lumbosacral region: Secondary | ICD-10-CM | POA: Diagnosis not present

## 2020-05-12 DIAGNOSIS — M21371 Foot drop, right foot: Secondary | ICD-10-CM | POA: Diagnosis not present

## 2020-05-12 DIAGNOSIS — R2689 Other abnormalities of gait and mobility: Secondary | ICD-10-CM | POA: Diagnosis not present

## 2020-05-14 DIAGNOSIS — R2689 Other abnormalities of gait and mobility: Secondary | ICD-10-CM | POA: Diagnosis not present

## 2020-05-14 DIAGNOSIS — R531 Weakness: Secondary | ICD-10-CM | POA: Diagnosis not present

## 2020-05-14 DIAGNOSIS — M5417 Radiculopathy, lumbosacral region: Secondary | ICD-10-CM | POA: Diagnosis not present

## 2020-05-14 DIAGNOSIS — M21371 Foot drop, right foot: Secondary | ICD-10-CM | POA: Diagnosis not present

## 2020-05-19 DIAGNOSIS — R531 Weakness: Secondary | ICD-10-CM | POA: Diagnosis not present

## 2020-05-19 DIAGNOSIS — R2689 Other abnormalities of gait and mobility: Secondary | ICD-10-CM | POA: Diagnosis not present

## 2020-05-19 DIAGNOSIS — M21371 Foot drop, right foot: Secondary | ICD-10-CM | POA: Diagnosis not present

## 2020-05-19 DIAGNOSIS — M5417 Radiculopathy, lumbosacral region: Secondary | ICD-10-CM | POA: Diagnosis not present

## 2020-05-20 DIAGNOSIS — H6123 Impacted cerumen, bilateral: Secondary | ICD-10-CM | POA: Diagnosis not present

## 2020-05-22 DIAGNOSIS — M5417 Radiculopathy, lumbosacral region: Secondary | ICD-10-CM | POA: Diagnosis not present

## 2020-05-22 DIAGNOSIS — M21371 Foot drop, right foot: Secondary | ICD-10-CM | POA: Diagnosis not present

## 2020-06-21 DIAGNOSIS — M5417 Radiculopathy, lumbosacral region: Secondary | ICD-10-CM | POA: Diagnosis not present

## 2020-06-21 DIAGNOSIS — M21371 Foot drop, right foot: Secondary | ICD-10-CM | POA: Diagnosis not present

## 2020-06-25 DIAGNOSIS — R809 Proteinuria, unspecified: Secondary | ICD-10-CM | POA: Diagnosis not present

## 2020-06-25 DIAGNOSIS — I129 Hypertensive chronic kidney disease with stage 1 through stage 4 chronic kidney disease, or unspecified chronic kidney disease: Secondary | ICD-10-CM | POA: Diagnosis not present

## 2020-06-25 DIAGNOSIS — N182 Chronic kidney disease, stage 2 (mild): Secondary | ICD-10-CM | POA: Diagnosis not present

## 2020-06-25 DIAGNOSIS — E876 Hypokalemia: Secondary | ICD-10-CM | POA: Diagnosis not present

## 2020-06-25 DIAGNOSIS — E2609 Other primary hyperaldosteronism: Secondary | ICD-10-CM | POA: Diagnosis not present

## 2020-06-25 DIAGNOSIS — I5032 Chronic diastolic (congestive) heart failure: Secondary | ICD-10-CM | POA: Diagnosis not present

## 2020-07-04 ENCOUNTER — Other Ambulatory Visit: Payer: Self-pay | Admitting: Family Medicine

## 2020-07-04 DIAGNOSIS — E782 Mixed hyperlipidemia: Secondary | ICD-10-CM

## 2020-07-22 DIAGNOSIS — M5417 Radiculopathy, lumbosacral region: Secondary | ICD-10-CM | POA: Diagnosis not present

## 2020-07-22 DIAGNOSIS — M21371 Foot drop, right foot: Secondary | ICD-10-CM | POA: Diagnosis not present

## 2020-08-01 ENCOUNTER — Other Ambulatory Visit: Payer: Self-pay | Admitting: Family Medicine

## 2020-08-01 DIAGNOSIS — E782 Mixed hyperlipidemia: Secondary | ICD-10-CM

## 2020-08-04 DIAGNOSIS — E876 Hypokalemia: Secondary | ICD-10-CM | POA: Diagnosis not present

## 2020-08-04 DIAGNOSIS — N182 Chronic kidney disease, stage 2 (mild): Secondary | ICD-10-CM | POA: Diagnosis not present

## 2020-08-06 DIAGNOSIS — M4807 Spinal stenosis, lumbosacral region: Secondary | ICD-10-CM | POA: Diagnosis not present

## 2020-08-06 DIAGNOSIS — E876 Hypokalemia: Secondary | ICD-10-CM | POA: Diagnosis not present

## 2020-08-06 DIAGNOSIS — E2609 Other primary hyperaldosteronism: Secondary | ICD-10-CM | POA: Diagnosis not present

## 2020-08-06 DIAGNOSIS — Z9889 Other specified postprocedural states: Secondary | ICD-10-CM | POA: Diagnosis not present

## 2020-08-06 DIAGNOSIS — M48061 Spinal stenosis, lumbar region without neurogenic claudication: Secondary | ICD-10-CM | POA: Diagnosis not present

## 2020-08-06 DIAGNOSIS — D638 Anemia in other chronic diseases classified elsewhere: Secondary | ICD-10-CM | POA: Diagnosis not present

## 2020-08-06 DIAGNOSIS — M21371 Foot drop, right foot: Secondary | ICD-10-CM | POA: Diagnosis not present

## 2020-08-06 DIAGNOSIS — I5032 Chronic diastolic (congestive) heart failure: Secondary | ICD-10-CM | POA: Diagnosis not present

## 2020-08-06 DIAGNOSIS — R1909 Other intra-abdominal and pelvic swelling, mass and lump: Secondary | ICD-10-CM | POA: Diagnosis not present

## 2020-08-06 DIAGNOSIS — N182 Chronic kidney disease, stage 2 (mild): Secondary | ICD-10-CM | POA: Diagnosis not present

## 2020-08-06 DIAGNOSIS — N400 Enlarged prostate without lower urinary tract symptoms: Secondary | ICD-10-CM | POA: Diagnosis not present

## 2020-08-06 DIAGNOSIS — I5030 Unspecified diastolic (congestive) heart failure: Secondary | ICD-10-CM | POA: Diagnosis not present

## 2020-08-06 DIAGNOSIS — E261 Secondary hyperaldosteronism: Secondary | ICD-10-CM | POA: Diagnosis not present

## 2020-08-06 DIAGNOSIS — I129 Hypertensive chronic kidney disease with stage 1 through stage 4 chronic kidney disease, or unspecified chronic kidney disease: Secondary | ICD-10-CM | POA: Diagnosis not present

## 2020-08-08 ENCOUNTER — Other Ambulatory Visit (HOSPITAL_COMMUNITY): Payer: Self-pay | Admitting: Nephrology

## 2020-08-08 DIAGNOSIS — E876 Hypokalemia: Secondary | ICD-10-CM

## 2020-08-08 DIAGNOSIS — E2609 Other primary hyperaldosteronism: Secondary | ICD-10-CM

## 2020-08-08 DIAGNOSIS — N182 Chronic kidney disease, stage 2 (mild): Secondary | ICD-10-CM

## 2020-08-11 ENCOUNTER — Ambulatory Visit: Payer: PPO | Admitting: Family Medicine

## 2020-08-20 ENCOUNTER — Other Ambulatory Visit: Payer: Self-pay

## 2020-08-20 ENCOUNTER — Ambulatory Visit (HOSPITAL_COMMUNITY)
Admission: RE | Admit: 2020-08-20 | Discharge: 2020-08-20 | Disposition: A | Discharge status: Home / Self Care | Payer: PPO | Source: Ambulatory Visit | Attending: Nephrology | Admitting: Nephrology

## 2020-08-20 DIAGNOSIS — E2609 Other primary hyperaldosteronism: Secondary | ICD-10-CM

## 2020-08-20 DIAGNOSIS — Z9049 Acquired absence of other specified parts of digestive tract: Secondary | ICD-10-CM | POA: Diagnosis not present

## 2020-08-20 DIAGNOSIS — E278 Other specified disorders of adrenal gland: Secondary | ICD-10-CM | POA: Diagnosis not present

## 2020-08-20 DIAGNOSIS — E876 Hypokalemia: Secondary | ICD-10-CM | POA: Diagnosis not present

## 2020-08-20 DIAGNOSIS — N182 Chronic kidney disease, stage 2 (mild): Secondary | ICD-10-CM | POA: Insufficient documentation

## 2020-08-20 MED ORDER — GADOBUTROL 1 MMOL/ML IV SOLN
7.0000 mL | Freq: Once | INTRAVENOUS | Status: AC | PRN
  Administered 2020-08-20: 7 mL via INTRAVENOUS

## 2020-08-21 DIAGNOSIS — M21371 Foot drop, right foot: Secondary | ICD-10-CM | POA: Diagnosis not present

## 2020-08-21 DIAGNOSIS — M5417 Radiculopathy, lumbosacral region: Secondary | ICD-10-CM | POA: Diagnosis not present

## 2020-09-03 DIAGNOSIS — C4441 Basal cell carcinoma of skin of scalp and neck: Secondary | ICD-10-CM | POA: Diagnosis not present

## 2020-09-03 DIAGNOSIS — Z809 Family history of malignant neoplasm, unspecified: Secondary | ICD-10-CM | POA: Diagnosis not present

## 2020-09-03 DIAGNOSIS — D485 Neoplasm of uncertain behavior of skin: Secondary | ICD-10-CM | POA: Diagnosis not present

## 2020-09-03 DIAGNOSIS — D1801 Hemangioma of skin and subcutaneous tissue: Secondary | ICD-10-CM | POA: Diagnosis not present

## 2020-09-03 DIAGNOSIS — I781 Nevus, non-neoplastic: Secondary | ICD-10-CM | POA: Diagnosis not present

## 2020-09-21 DIAGNOSIS — M5417 Radiculopathy, lumbosacral region: Secondary | ICD-10-CM | POA: Diagnosis not present

## 2020-09-21 DIAGNOSIS — M21371 Foot drop, right foot: Secondary | ICD-10-CM | POA: Diagnosis not present

## 2020-09-23 ENCOUNTER — Other Ambulatory Visit: Payer: Self-pay

## 2020-09-23 ENCOUNTER — Encounter: Payer: Self-pay | Admitting: Family Medicine

## 2020-09-23 ENCOUNTER — Ambulatory Visit (INDEPENDENT_AMBULATORY_CARE_PROVIDER_SITE_OTHER): Payer: PPO | Admitting: Family Medicine

## 2020-09-23 VITALS — BP 130/68 | HR 67 | Temp 98.3°F | Wt 166.0 lb

## 2020-09-23 DIAGNOSIS — Z951 Presence of aortocoronary bypass graft: Secondary | ICD-10-CM

## 2020-09-23 DIAGNOSIS — M5416 Radiculopathy, lumbar region: Secondary | ICD-10-CM | POA: Diagnosis not present

## 2020-09-23 DIAGNOSIS — I10 Essential (primary) hypertension: Secondary | ICD-10-CM | POA: Diagnosis not present

## 2020-09-23 DIAGNOSIS — Z7689 Persons encountering health services in other specified circumstances: Secondary | ICD-10-CM

## 2020-09-23 DIAGNOSIS — E782 Mixed hyperlipidemia: Secondary | ICD-10-CM | POA: Diagnosis not present

## 2020-09-23 DIAGNOSIS — I25119 Atherosclerotic heart disease of native coronary artery with unspecified angina pectoris: Secondary | ICD-10-CM

## 2020-09-23 DIAGNOSIS — R7303 Prediabetes: Secondary | ICD-10-CM | POA: Diagnosis not present

## 2020-09-23 DIAGNOSIS — E896 Postprocedural adrenocortical (-medullary) hypofunction: Secondary | ICD-10-CM

## 2020-09-23 DIAGNOSIS — Z23 Encounter for immunization: Secondary | ICD-10-CM | POA: Diagnosis not present

## 2020-09-23 NOTE — Progress Notes (Signed)
Subjective:    Patient ID: Christopher Booth, male    DOB: February 08, 1940, 80 y.o.   MRN: [id number]  HPI Patient is a very pleasant 80 year old Caucasian gentleman here today to establish care.  He has a history of coronary artery disease.  He underwent CABG in 2018.  He sees [name] for cardiology.  He is on a beta-blocker, and ACE inhibitor, and aspirin, and a statin.  He has a longstanding history of hypertension.  He also has a longstanding history of hypokalemia.  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  This was not routinely monitored and began to grow.  Therefore in 2021 he underwent surgery to remove the adrenal adenoma.  However the patient still has persistent hypertension as well as hypokalemia and has to take inspra 50 mg twice daily in addition to 60 mill equivalents a day of potassium.  As a result, his nephrologist recommended a repeat MRI of his adrenal glands and he was found to have a 1 cm mass on his left adrenal gland.  Per the patient's report nephrology is monitoring this yearly for any growth.  His blood pressure and his potassium were monitored closely by his nephrologist.  He also has a history of prediabetes and his last A1c was barely over 6 a year ago however is due to be rechecked again.  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  He underwent an emergent lumbar laminectomy in 2021 due to the foot drop however he has persistent weakness in the right leg even after causing a foot drop.  He is due today for Pneumovax 23, Prevnar 20, Shingrix, and a flu shot.  He elects to get the flu shot at his local pharmacy but he does consent to receive Pneumovax 23.  Due to his age, he is no longer recommended to check PSA or colon cancer screening.  He does report gynecomastia ever since starting the finasteride.  He has not seen any improvement in his nocturia on finasteride and he is interested in potentially stopping it.   Past  Medical History:  Diagnosis Date   Adrenal mass ([place])    Right, benign Conn's Syndrome   Arthritis    CAD (coronary artery disease)    Multivessel/left main status post CABG 12/2016   Dysrhythmia    afib after surgery   Essential hypertension    Gynecomastia    Hyperaldosteronism    Hypertension    Phreesia 07/21/2019   Lumbar radiculopathy    Myocardial infarction ([place])    Phreesia 07/21/2019   Pneumonia    Postoperative atrial fibrillation (HCC)    Pre-diabetes    Spinal stenosis    Temporomandibular joint disease    Wears glasses    Past Surgical History:  Procedure Laterality Date   ADRENAL GLAND SURGERY  10/17/2019   CATARACT EXTRACTION W/PHACO Right 01/28/2015   Procedure: CATARACT EXTRACTION PHACO AND INTRAOCULAR LENS PLACEMENT RIGHT EYE;  Surgeon: Williams Che, MD;  Location: [facility];  Service: Ophthalmology;  Laterality: Right;  CDE:27.23   CATARACT EXTRACTION W/PHACO Left 04/14/2015   Procedure: CATARACT EXTRACTION PHACO AND INTRAOCULAR LENS PLACEMENT LEFT EYE CDE=15.86;  Surgeon: Williams Che, MD;  Location: [facility];  Service: Ophthalmology;  Laterality: Left;   [place]   COLONOSCOPY     CORONARY ARTERY BYPASS GRAFT N/A 01/12/2017   Procedure: CORONARY ARTERY BYPASS GRAFTING (CABG);  Surgeon: Grace Isaac, MD;  Location: [place];  Service: Open Heart Surgery;  Laterality: N/A;  Times 3 using left internal mammary artery to LAD and endoscopically harvested right thigh saphenous vein to OM and PD   EYE SURGERY N/A    Phreesia 07/21/2019   KNEE ARTHROSCOPY Right 10/09/2013   Procedure: RIGHT ARTHROSCOPY KNEE Partial medial and lateral meniscal tear and chondroplasty;  Surgeon: Hessie Dibble, MD;  Location: [place];  Service: Orthopedics;  Laterality: Right;  Partial medial and lateral meniscal tear and chondroplasty    LEFT HEART CATH AND CORONARY ANGIOGRAPHY N/A 01/11/2017   Procedure: LEFT HEART CATH  AND CORONARY ANGIOGRAPHY;  Surgeon: Jettie Booze, MD;  Location: [place] CV LAB;  Service: Cardiovascular;  Laterality: N/A;   LUMBAR LAMINECTOMY/DECOMPRESSION MICRODISCECTOMY Right 09/20/2019   Procedure: Right Lumbar four- Lumbar five Lumbar five Sacral one Laminectomy/Foraminotomy;  Surgeon: Kristeen Miss, MD;  Location: [place];  Service: Neurosurgery;  Laterality: Right;   ROBOTIC ADRENALECTOMY Right 10/17/2019   Procedure: XI ROBOTIC ADRENALECTOMY;  Surgeon: Ralene Ok, MD;  Location: [place];  Service: General;  Laterality: Right;   TEE WITHOUT CARDIOVERSION N/A 01/12/2017   Procedure: TRANSESOPHAGEAL ECHOCARDIOGRAM (TEE);  Surgeon: Grace Isaac, MD;  Location: [place];  Service: Open Heart Surgery;  Laterality: N/A;   TONSILLECTOMY     VASECTOMY N/A    Phreesia 07/21/2019   Current Outpatient Medications on File Prior to Visit  Medication Sig Dispense Refill   acetaminophen (TYLENOL) 500 MG tablet Take 500 mg by mouth every 6 (six) hours as needed for mild pain.      aspirin 81 MG EC tablet Take 81 mg by mouth daily.     atorvastatin (LIPITOR) 20 MG tablet TAKE ONE TABLET BY MOUTH ONCE DAILY. 30 tablet 0   carvedilol (COREG) 12.5 MG tablet Take 12.5 mg by mouth in the morning and at bedtime.     eplerenone (INSPRA) 25 MG tablet Take 50 mg by mouth 2 (two) times daily.     finasteride (PROSCAR) 5 MG tablet Take 5 mg by mouth daily.     lisinopril (ZESTRIL) 5 MG tablet Take 5 mg by mouth daily.     potassium chloride SA (KLOR-CON) 20 MEQ tablet Take 60 mEq by mouth daily.     Multiple Vitamin (MULTIVITAMIN WITH MINERALS) TABS tablet Take 1 tablet by mouth daily.     [DISCONTINUED] telmisartan-hydrochlorothiazide (MICARDIS HCT) 80-12.5 MG per tablet Take 1 tablet by mouth daily.     No current facility-administered medications on file prior to visit.   Allergies  Allergen Reactions   Diovan [Valsartan]     unknown   Social History   Socioeconomic History    Marital status: Married    Spouse name: Not on file   Number of children: Not on file   Years of education: Not on file   Highest education level: Not on file  Occupational History   Occupation: Nurse Engineer, [employer]: RETIRED    Comment: Retired  Tobacco Use   Smoking status: Never   Smokeless tobacco: Never  Vaping Use   Vaping Use: Never used  Substance and Sexual Activity   Alcohol use: Yes    Comment: Occasional   Drug use: No   Sexual activity: Not Currently  Other Topics Concern   Not on file  Social History Narrative   Not on file   Social Determinants of Health   Financial Resource Strain: Not  on file  Food Insecurity: Not on file  Transportation Needs: Not on file  Physical Activity: Not on file  Stress: Not on file  Social Connections: Not on file  Intimate Partner Violence: Not on file   Family History  Problem Relation Age of Onset   Hypertension Other    Cancer Brother        Colon   Cancer Brother        Melanoma       Review of Systems  All other systems reviewed and are negative.     Objective:   Physical Exam Vitals reviewed.  Constitutional:      General: He is not in acute distress.    Appearance: Normal appearance. He is normal weight. He is not ill-appearing or toxic-appearing.  HENT:     Right Ear: Tympanic membrane and ear canal normal.     Left Ear: Tympanic membrane and ear canal normal.     Nose: Nose normal. No congestion or rhinorrhea.  Eyes:     General: No scleral icterus.    Extraocular Movements: Extraocular movements intact.     Conjunctiva/sclera: Conjunctivae normal.     Pupils: Pupils are equal, round, and reactive to light.  Neck:     Vascular: No carotid bruit.  Cardiovascular:     Rate and Rhythm: Normal rate and regular rhythm.     Pulses: Normal pulses.     Heart sounds: Normal heart sounds. No murmur heard.   No friction rub. No gallop.  Pulmonary:     Effort: Pulmonary effort is normal. No  respiratory distress.     Breath sounds: Normal breath sounds. No wheezing or rales.  Abdominal:     General: Bowel sounds are normal. There is no distension.     Palpations: Abdomen is soft.     Tenderness: There is no abdominal tenderness. There is no guarding.  Musculoskeletal:     Cervical back: Neck supple. Decreased range of motion.  Lymphadenopathy:     Cervical: No cervical adenopathy.  Neurological:     General: No focal deficit present.     Mental Status: He is alert and oriented to person, place, and time. Mental status is at baseline.     Cranial Nerves: No cranial nerve deficit.     Sensory: No sensory deficit.     Motor: Weakness present.     Coordination: Coordination normal.     Gait: Gait abnormal.  Psychiatric:        Mood and Affect: Mood normal.        Behavior: Behavior normal.        Thought Content: Thought content normal.        Judgment: Judgment normal.          Assessment & Plan:  Prediabetes - Plan: Hemoglobin A1c, CBC with Differential/Platelet, COMPLETE METABOLIC PANEL WITH GFR  Encounter to establish care  Essential hypertension, benign  Mixed hyperlipidemia  Coronary artery disease involving native coronary artery of native heart with angina pectoris ([place])  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[place])  S/P CABG x 2  Lumbar radiculopathy Given the history of patient's prediabetes I will check an A1c to ensure that this has not progressed.  I will also monitor a CBC and a CMP to evaluate for any renal insufficiency, hypokalemia or other electrolyte disturbances given his history of a right adrenal resection as well as Conn syndrome.  His blood pressure is outstanding today.  He is on appropriate medical therapy for coronary  artery disease.  He had an echocardiogram last year that showed an ejection fraction of 55 to 60%.  There is no history of congestive heart failure.  He does have a chronic right foot drop however he is wearing appropriate bracing.  He  is due to check a fasting lipid panel however he is not fasting today.  I would like to check a fasting lipid panel at his earliest convenience and I would like to keep his LDL cholesterol less than 70.  He did receive Pneumovax 23 today.

## 2020-09-23 NOTE — Addendum Note (Signed)
Addended by: Sheral Flow on: 09/23/2020 04:28 PM   Modules accepted: Orders

## 2020-09-24 LAB — CBC WITH DIFFERENTIAL/PLATELET
Absolute Monocytes: 729 cells/uL (ref 200–950)
Basophils Absolute: 89 cells/uL (ref 0–200)
Basophils Relative: 1.1 %
Eosinophils Absolute: 705 cells/uL — ABNORMAL HIGH (ref 15–500)
Eosinophils Relative: 8.7 %
HCT: 40.1 % (ref 38.5–50.0)
Hemoglobin: 14 g/dL (ref 13.2–17.1)
Lymphs Abs: 1936 cells/uL (ref 850–3900)
MCH: 30.9 pg (ref 27.0–33.0)
MCHC: 34.9 g/dL (ref 32.0–36.0)
MCV: 88.5 fL (ref 80.0–100.0)
MPV: 9.8 fL (ref 7.5–12.5)
Monocytes Relative: 9 %
Neutro Abs: 4641 cells/uL (ref 1500–7800)
Neutrophils Relative %: 57.3 %
Platelets: 284 10*3/uL (ref 140–400)
RBC: 4.53 10*6/uL (ref 4.20–5.80)
RDW: 11.8 % (ref 11.0–15.0)
Total Lymphocyte: 23.9 %
WBC: 8.1 10*3/uL (ref 3.8–10.8)

## 2020-09-24 LAB — COMPLETE METABOLIC PANEL WITH GFR
AG Ratio: 1.3 (calc) (ref 1.0–2.5)
ALT: 31 U/L (ref 9–46)
AST: 23 U/L (ref 10–35)
Albumin: 4.2 g/dL (ref 3.6–5.1)
Alkaline phosphatase (APISO): 76 U/L (ref 35–144)
BUN: 18 mg/dL (ref 7–25)
CO2: 28 mmol/L (ref 20–32)
Calcium: 9.7 mg/dL (ref 8.6–10.3)
Chloride: 102 mmol/L (ref 98–110)
Creat: 1.03 mg/dL (ref 0.70–1.28)
Globulin: 3.2 g/dL (calc) (ref 1.9–3.7)
Glucose, Bld: 140 mg/dL — ABNORMAL HIGH (ref 65–99)
Potassium: 4.5 mmol/L (ref 3.5–5.3)
Sodium: 138 mmol/L (ref 135–146)
Total Bilirubin: 0.7 mg/dL (ref 0.2–1.2)
Total Protein: 7.4 g/dL (ref 6.1–8.1)
eGFR: 74 mL/min/{1.73_m2} (ref >=60)

## 2020-09-24 LAB — HEMOGLOBIN A1C
Hgb A1c MFr Bld: 6 % of total Hgb — ABNORMAL HIGH (ref <5.7)
Mean Plasma Glucose: 126 mg/dL
eAG (mmol/L): 7 mmol/L

## 2020-09-25 ENCOUNTER — Encounter: Payer: Self-pay | Admitting: *Deleted

## 2020-09-25 DIAGNOSIS — C4441 Basal cell carcinoma of skin of scalp and neck: Secondary | ICD-10-CM | POA: Diagnosis not present

## 2020-09-30 ENCOUNTER — Telehealth: Payer: Self-pay

## 2020-09-30 MED ORDER — LOSARTAN POTASSIUM 25 MG PO TABS: 25.0000 mg | ORAL_TABLET | Freq: Every day | ORAL | 3 refills | Status: AC

## 2020-09-30 NOTE — Telephone Encounter (Signed)
Call placed to patient to clarify.   Patient wife is taking lisinopril '10mg'$ , and patient is taking Losartan '25mg'$ .   Medication lists updated.

## 2020-09-30 NOTE — Telephone Encounter (Signed)
Pt came in today, just wanting to correct some medication info that he gave the dr at his last visit. Pt stated that he told the dr he was taking lisinopril (ZESTRIL) 5 MG tablet , pt stated he does not take this med this is a med his spouse takes. He just wanted the dr to know of this mistake. Pt states that he takes Losartan at the same dosage '5mg'$ .

## 2020-10-01 ENCOUNTER — Other Ambulatory Visit: Payer: Self-pay | Admitting: Family Medicine

## 2020-10-01 DIAGNOSIS — E782 Mixed hyperlipidemia: Secondary | ICD-10-CM

## 2020-10-22 DIAGNOSIS — M21371 Foot drop, right foot: Secondary | ICD-10-CM | POA: Diagnosis not present

## 2020-10-22 DIAGNOSIS — M5417 Radiculopathy, lumbosacral region: Secondary | ICD-10-CM | POA: Diagnosis not present

## 2020-10-24 DIAGNOSIS — M79671 Pain in right foot: Secondary | ICD-10-CM | POA: Diagnosis not present

## 2020-10-24 DIAGNOSIS — L603 Nail dystrophy: Secondary | ICD-10-CM | POA: Diagnosis not present

## 2020-10-24 DIAGNOSIS — M21371 Foot drop, right foot: Secondary | ICD-10-CM | POA: Diagnosis not present

## 2020-10-30 ENCOUNTER — Other Ambulatory Visit: Payer: Self-pay

## 2020-10-30 ENCOUNTER — Ambulatory Visit (INDEPENDENT_AMBULATORY_CARE_PROVIDER_SITE_OTHER): Payer: PPO

## 2020-10-30 VITALS — Ht 66.0 in | Wt 166.0 lb

## 2020-10-30 DIAGNOSIS — Z0000 Encounter for general adult medical examination without abnormal findings: Secondary | ICD-10-CM

## 2020-10-30 NOTE — Patient Instructions (Signed)
[name] , Thank you for taking time to come for your Medicare Wellness Visit. I appreciate your ongoing commitment to your health goals. Please review the following plan we discussed and let me know if I can assist you in the future.   Screening recommendations/referrals: Colonoscopy: Done 2012, no repeat required due to age. Recommended yearly ophthalmology/optometry visit for glaucoma screening and checkup Recommended yearly dental visit for hygiene and checkup  Vaccinations: Influenza vaccine: Done 12/05/2019 Repeat annually  Pneumococcal vaccine: Done 09/23/2020 Tdap vaccine: Due. Repeat in 10 years  Shingles vaccine: Shingrix discussed. Please contact your pharmacy for coverage information.     Covid-19: Done 04/26/19 and 05/26/19.   Advanced directives: Please bring a copy of your health care power of attorney and living will to the office to be added to your chart at your convenience.   Conditions/risks identified: Aim for 30 minutes of exercise or walking each day, drink 6-8 glasses of water and eat lots of fruits and vegetables.   Next appointment: Follow up in one year for your annual wellness visit. 2023  Preventive Care 79 Years and Older, Male  Preventive care refers to lifestyle choices and visits with your health care provider that can promote health and wellness. What does preventive care include? A yearly physical exam. This is also called an annual well check. Dental exams once or twice a year. Routine eye exams. Ask your health care provider how often you should have your eyes checked. Personal lifestyle choices, including: Daily care of your teeth and gums. Regular physical activity. Eating a healthy diet. Avoiding tobacco and drug use. Limiting alcohol use. Practicing safe sex. Taking low doses of aspirin every day. Taking vitamin and mineral supplements as recommended by your health care provider. What happens during an annual well check? The services  and screenings done by your health care provider during your annual well check will depend on your age, overall health, lifestyle risk factors, and family history of disease. Counseling  Your health care provider may ask you questions about your: Alcohol use. Tobacco use. Drug use. Emotional well-being. Home and relationship well-being. Sexual activity. Eating habits. History of falls. Memory and ability to understand (cognition). Work and work Statistician. Screening  You may have the following tests or measurements: Height, weight, and BMI. Blood pressure. Lipid and cholesterol levels. These may be checked every 5 years, or more frequently if you are over 79 years old. Skin check. Lung cancer screening. You may have this screening every year starting at age 79 if you have a 30-pack-year history of smoking and currently smoke or have quit within the past 15 years. Fecal occult blood test (FOBT) of the stool. You may have this test every year starting at age 79. Flexible sigmoidoscopy or colonoscopy. You may have a sigmoidoscopy every 5 years or a colonoscopy every 10 years starting at age 79. Prostate cancer screening. Recommendations will vary depending on your family history and other risks. Hepatitis C blood test. Hepatitis B blood test. Sexually transmitted disease (STD) testing. Diabetes screening. This is done by checking your blood sugar (glucose) after you have not eaten for a while (fasting). You may have this done every 1-3 years. Abdominal aortic aneurysm (AAA) screening. You may need this if you are a current or former smoker. Osteoporosis. You may be screened starting at age 21 if you are at high risk. Talk with your health care provider about your test results, treatment options, and if necessary, the need for more tests.  Vaccines  Your health care provider may recommend certain vaccines, such as: Influenza vaccine. This is recommended every year. Tetanus, diphtheria,  and acellular pertussis (Tdap, Td) vaccine. You may need a Td booster every 10 years. Zoster vaccine. You may need this after age 102. Pneumococcal 13-valent conjugate (PCV13) vaccine. One dose is recommended after age 2. Pneumococcal polysaccharide (PPSV23) vaccine. One dose is recommended after age 47. Talk to your health care provider about which screenings and vaccines you need and how often you need them. This information is not intended to replace advice given to you by your health care provider. Make sure you discuss any questions you have with your health care provider. Document Released: 02/07/2015 Document Revised: 10/01/2015 Document Reviewed: 11/12/2014 Elsevier Interactive Patient Education  2017 [place] Prevention in the Home Falls can cause injuries. They can happen to people of all ages. There are many things you can do to make your home safe and to help prevent falls. What can I do on the outside of my home? Regularly fix the edges of walkways and driveways and fix any cracks. Remove anything that might make you trip as you walk through a door, such as a raised step or threshold. Trim any bushes or trees on the path to your home. Use bright outdoor lighting. Clear any walking paths of anything that might make someone trip, such as rocks or tools. Regularly check to see if handrails are loose or broken. Make sure that both sides of any steps have handrails. Any raised decks and porches should have guardrails on the edges. Have any leaves, snow, or ice cleared regularly. Use sand or salt on walking paths during winter. Clean up any spills in your garage right away. This includes oil or grease spills. What can I do in the bathroom? Use night lights. Install grab bars by the toilet and in the tub and shower. Do not use towel bars as grab bars. Use non-skid mats or decals in the tub or shower. If you need to sit down in the shower, use a plastic, non-slip  stool. Keep the floor dry. Clean up any water that spills on the floor as soon as it happens. Remove soap buildup in the tub or shower regularly. Attach bath mats securely with double-sided non-slip rug tape. Do not have throw rugs and other things on the floor that can make you trip. What can I do in the bedroom? Use night lights. Make sure that you have a light by your bed that is easy to reach. Do not use any sheets or blankets that are too big for your bed. They should not hang down onto the floor. Have a firm chair that has side arms. You can use this for support while you get dressed. Do not have throw rugs and other things on the floor that can make you trip. What can I do in the kitchen? Clean up any spills right away. Avoid walking on wet floors. Keep items that you use a lot in easy-to-reach places. If you need to reach something above you, use a strong step stool that has a grab bar. Keep electrical cords out of the way. Do not use floor polish or wax that makes floors slippery. If you must use wax, use non-skid floor wax. Do not have throw rugs and other things on the floor that can make you trip. What can I do with my stairs? Do not leave any items on the stairs. Make sure that  there are handrails on both sides of the stairs and use them. Fix handrails that are broken or loose. Make sure that handrails are as long as the stairways. Check any carpeting to make sure that it is firmly attached to the stairs. Fix any carpet that is loose or worn. Avoid having throw rugs at the top or bottom of the stairs. If you do have throw rugs, attach them to the floor with carpet tape. Make sure that you have a light switch at the top of the stairs and the bottom of the stairs. If you do not have them, ask someone to add them for you. What else can I do to help prevent falls? Wear shoes that: Do not have high heels. Have rubber bottoms. Are comfortable and fit you well. Are closed at the  toe. Do not wear sandals. If you use a stepladder: Make sure that it is fully opened. Do not climb a closed stepladder. Make sure that both sides of the stepladder are locked into place. Ask someone to hold it for you, if possible. Clearly mark and make sure that you can see: Any grab bars or handrails. First and last steps. Where the edge of each step is. Use tools that help you move around (mobility aids) if they are needed. These include: Canes. Walkers. Scooters. Crutches. Turn on the lights when you go into a dark area. Replace any light bulbs as soon as they burn out. Set up your furniture so you have a clear path. Avoid moving your furniture around. If any of your floors are uneven, fix them. If there are any pets around you, be aware of where they are. Review your medicines with your doctor. Some medicines can make you feel dizzy. This can increase your chance of falling. Ask your doctor what other things that you can do to help prevent falls. This information is not intended to replace advice given to you by your health care provider. Make sure you discuss any questions you have with your health care provider. Document Released: 11/07/2008 Document Revised: 06/19/2015 Document Reviewed: 02/15/2014 Elsevier Interactive Patient Education  2017 [employer].

## 2020-10-30 NOTE — Progress Notes (Signed)
Subjective:   Christopher Booth is a 80 y.o. male who presents for an Initial Medicare Annual Wellness Visit. Virtual Visit via Telephone Note  I connected with  Christopher Booth on 10/30/20 at  1:15 PM EDT by telephone and verified that I am speaking with the correct person using two identifiers.  Location: Patient: Home Provider: [facility] Persons participating in the virtual visit: patient/Nurse Health Advisor   I discussed the limitations, risks, security and privacy concerns of performing an evaluation and management service by telephone and the availability of in person appointments. The patient expressed understanding and agreed to proceed.  Interactive audio and video telecommunications were attempted between this nurse and patient, however failed, due to patient having technical difficulties OR patient did not have access to video capability.  We continued and completed visit with audio only.  Some vital signs may be absent or patient reported.   Chriss Driver, LPN  Review of Systems     Cardiac Risk Factors include: advanced age (>81men, >86 women);hypertension;dyslipidemia;male gender;sedentary lifestyle     Objective:    Today's Vitals   10/30/20 1250  Weight: 166 lb (75.3 kg)  Height: 5\' 6"  (1.676 m)   Body mass index is 26.79 kg/m.  Advanced Directives 10/30/2020 10/12/2019 09/20/2019 03/18/2017 01/19/2017 01/11/2017 01/11/2017  Does Patient Have a Medical Advance Directive? Yes Yes Yes Yes Yes Yes Yes  Type of Paramedic of [place];Living will [place];Living will [place];Living will Healthcare Power of [place];Living will [place];Living will [place];Living will  Does patient want to make changes to medical advance directive? - - No - Patient declined No - Patient declined No - Patient declined No - Patient declined -  Copy of  [place] in Chart? No - copy requested - No - copy requested No - copy requested - - -  Would patient like information on creating a medical advance directive? No - Patient declined - - - - - -    Current Medications (verified) Outpatient Encounter Medications as of 10/30/2020  Medication Sig   acetaminophen (TYLENOL) 500 MG tablet Take 500 mg by mouth every 6 (six) hours as needed for mild pain.    aspirin 81 MG EC tablet Take 81 mg by mouth daily.   atorvastatin (LIPITOR) 20 MG tablet TAKE ONE TABLET BY MOUTH ONCE DAILY.   carvedilol (COREG) 12.5 MG tablet Take 12.5 mg by mouth in the morning and at bedtime.   eplerenone (INSPRA) 25 MG tablet Take 50 mg by mouth 2 (two) times daily.   finasteride (PROSCAR) 5 MG tablet Take 5 mg by mouth daily.   losartan (COZAAR) 25 MG tablet Take 1 tablet (25 mg total) by mouth daily.   Multiple Vitamin (MULTIVITAMIN WITH MINERALS) TABS tablet Take 1 tablet by mouth daily.   potassium chloride SA (KLOR-CON) 20 MEQ tablet Take 60 mEq by mouth daily.   [DISCONTINUED] telmisartan-hydrochlorothiazide (MICARDIS HCT) 80-12.5 MG per tablet Take 1 tablet by mouth daily.   No facility-administered encounter medications on file as of 10/30/2020.    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, benign Conn's Syndrome   Arthritis    CAD (coronary artery disease)    Multivessel/left main status post CABG 12/2016   Dysrhythmia    afib after surgery   Essential hypertension  Gynecomastia    Hyperaldosteronism    Hypertension    Phreesia 07/21/2019   Lumbar radiculopathy    Myocardial infarction [facility])    Phreesia 07/21/2019   Pneumonia    Postoperative atrial fibrillation (HCC)    Pre-diabetes    Spinal stenosis    Temporomandibular joint disease    Wears glasses    Past Surgical History:  Procedure Laterality Date   ADRENAL GLAND SURGERY  10/17/2019    CATARACT EXTRACTION W/PHACO Right 01/28/2015   Procedure: CATARACT EXTRACTION PHACO AND INTRAOCULAR LENS PLACEMENT RIGHT EYE;  Surgeon: Williams Che, MD;  Location: [facility];  Service: Ophthalmology;  Laterality: Right;  CDE:27.23   CATARACT EXTRACTION W/PHACO Left 04/14/2015   Procedure: CATARACT EXTRACTION PHACO AND INTRAOCULAR LENS PLACEMENT LEFT EYE CDE=15.86;  Surgeon: Williams Che, MD;  Location: [facility];  Service: Ophthalmology;  Laterality: Left;   [place]   COLONOSCOPY     CORONARY ARTERY BYPASS GRAFT N/A 01/12/2017   Procedure: CORONARY ARTERY BYPASS GRAFTING (CABG);  Surgeon: Grace Isaac, MD;  Location: [place];  Service: Open Heart Surgery;  Laterality: N/A;  Times 3 using left internal mammary artery to LAD and endoscopically harvested right thigh saphenous vein to OM and PD   EYE SURGERY N/A    Phreesia 07/21/2019   KNEE ARTHROSCOPY Right 10/09/2013   Procedure: RIGHT ARTHROSCOPY KNEE Partial medial and lateral meniscal tear and chondroplasty;  Surgeon: Hessie Dibble, MD;  Location: [place];  Service: Orthopedics;  Laterality: Right;  Partial medial and lateral meniscal tear and chondroplasty    LEFT HEART CATH AND CORONARY ANGIOGRAPHY N/A 01/11/2017   Procedure: LEFT HEART CATH AND CORONARY ANGIOGRAPHY;  Surgeon: Jettie Booze, MD;  Location: [place] [facility];  Service: Cardiovascular;  Laterality: N/A;   LUMBAR LAMINECTOMY/DECOMPRESSION MICRODISCECTOMY Right 09/20/2019   Procedure: Right Lumbar four- Lumbar five Lumbar five Sacral one Laminectomy/Foraminotomy;  Surgeon: Kristeen Miss, MD;  Location: [place];  Service: Neurosurgery;  Laterality: Right;   ROBOTIC ADRENALECTOMY Right 10/17/2019   Procedure: XI ROBOTIC ADRENALECTOMY;  Surgeon: Ralene Ok, MD;  Location: [place];  Service: General;  Laterality: Right;   TEE WITHOUT CARDIOVERSION N/A 01/12/2017   Procedure: TRANSESOPHAGEAL ECHOCARDIOGRAM (TEE);   Surgeon: Grace Isaac, MD;  Location: [place];  Service: Open Heart Surgery;  Laterality: N/A;   TONSILLECTOMY     VASECTOMY N/A    Phreesia 07/21/2019   Family History  Problem Relation Age of Onset   Hypertension Other    Cancer Brother        Colon   Cancer Brother        Melanoma   Social History   Socioeconomic History   Marital status: Married    Spouse name: Not on file   Number of children: Not on file   Years of education: Not on file   Highest education level: Not on file  Occupational History   Occupation: Nurse Engineer, petroleum: RETIRED    Comment: Retired  Tobacco Use   Smoking status: Never   Smokeless tobacco: Never  Vaping Use   Vaping Use: Never used  Substance and Sexual Activity   Alcohol use: Yes    Comment: Occasional   Drug use: No   Sexual activity: Not Currently  Other Topics Concern   Not on file  Social History Narrative   Not on file   Social Determinants of Health  Financial Resource Strain: Low Risk    Difficulty of Paying Living Expenses: Not hard at all  Food Insecurity: No Food Insecurity   Worried About Charity fundraiser in the Last Year: Never true   Ran Out of Food in the Last Year: Never true  Transportation Needs: No Transportation Needs   Lack of Transportation (Medical): No   Lack of Transportation (Non-Medical): No  Physical Activity: Insufficiently Active   Days of Exercise per Week: 3 days   Minutes of Exercise per Session: 20 min  Stress: No Stress Concern Present   Feeling of Stress : Not at all  Social Connections: Socially Integrated   Frequency of Communication with Friends and Family: More than three times a week   Frequency of Social Gatherings with Friends and Family: More than three times a week   Attends Religious Services: 1 to 4 times per year   Active Member of [employer] or Organizations: No   Attends Music therapist: 1 to 4 times per year   Marital Status: Married    Tobacco  Counseling Counseling given: Not Answered   Clinical Intake:  Pre-visit preparation completed: Yes  Pain : No/denies pain     BMI - recorded: 26.79 Nutritional Status: BMI 25 -29 Overweight Nutritional Risks: None Diabetes: No  How often do you need to have someone help you when you read instructions, pamphlets, or other written materials from your doctor or pharmacy?: 1 - Never  Diabetic?NO  Interpreter Needed?: No  Information entered by :: MJ Abraham Margulies, LPN   Activities of Daily Living In your present state of health, do you have any difficulty performing the following activities: 10/30/2020  Hearing? N  Vision? N  Difficulty concentrating or making decisions? N  Walking or climbing stairs? N  Dressing or bathing? N  Doing errands, shopping? N  Preparing Food and eating ? N  Using the Toilet? N  In the past six months, have you accidently leaked urine? N  Do you have problems with loss of bowel control? N  Managing your Medications? N  Managing your Finances? N  Housekeeping or managing your Housekeeping? N  Some recent data might be hidden    Patient Care Team: Susy Frizzle, MD as PCP - General (Family Medicine) Satira Sark, MD as PCP - Cardiology (Cardiology) Grace Isaac, MD (Inactive) as Consulting Physician (Cardiothoracic Surgery) Daneil Dolin, MD as Consulting Physician (Gastroenterology)  Indicate any recent Medical Services you may have received from other than [facility] providers in the past year (date may be approximate).     Assessment:   This is a routine wellness examination for Christopher Booth.  Hearing/Vision screen Hearing Screening - Comments:: No hearing issues. Vision Screening - Comments:: [place]. Eye Md retired. Pt declines referral.  Dietary issues and exercise activities discussed: Current Exercise Habits: Home exercise routine, Type of exercise: walking, Time (Minutes): 20, Frequency (Times/Week): 5, Weekly Exercise  (Minutes/Week): 100, Intensity: Mild, Exercise limited by: cardiac condition(s);orthopedic condition(s)   Goals Addressed             This Visit's Progress    Exercise 3x per week (30 min per time)         Depression Screen PHQ Mar 07, 2022 Scores 10/30/2020 07/24/2019 05/28/2019 06/16/2017 03/18/2017 02/09/2017  PHQ - 2 Score 0 0 0 0 0 0  PHQ- 9 Score - - - 0 1 -    Fall Risk Fall Risk  10/30/2020 05/28/2019 08/24/2018 03/18/2017 02/09/2017  Falls in  the past year? 0 0 (No Data) No No  Comment - - Emmi Telephone Survey: data to providers prior to load - -  Number falls in past yr: 0 0 (No Data) - -  Comment - - Emmi Telephone Survey Actual Response =  - -  Injury with Fall? 0 0 - - -  Risk for fall due to : Impaired balance/gait - - - History of fall(s)  Risk for fall due to: Comment Currently being treated for drop foot. - - - -  Follow up Falls prevention discussed - - - -    FALL RISK PREVENTION PERTAINING TO THE HOME:  Any stairs in or around the home? Yes  If so, are there any without handrails? No  Home free of loose throw rugs in walkways, pet beds, electrical cords, etc? Yes  Adequate lighting in your home to reduce risk of falls? Yes   ASSISTIVE DEVICES UTILIZED TO PREVENT FALLS:  Life alert? No  Use of a cane, walker or w/c? No  Grab bars in the bathroom? Yes  Shower chair or bench in shower? Yes  Elevated toilet seat or a handicapped toilet? Yes   TIMED UP AND GO:  Was the test performed? No . Phone visit.   Cognitive Function:     6CIT Screen 10/30/2020  What Year? 0 points  What month? 0 points  What time? 0 points  Count back from 20 0 points  Months in reverse 0 points  Repeat phrase 0 points  Total Score 0    Immunizations Immunization History  Administered Date(s) Administered   Influenza, High Dose Seasonal PF 10/26/2016, 11/17/2017, 10/24/2018   Influenza-Unspecified 10/25/2012, 12/05/2019   PFIZER(Purple Top)SARS-COV-2 Vaccination 04/26/2019,  05/26/2019   Pneumococcal Polysaccharide-23 09/23/2020   Zoster, Live 09/27/2015    TDAP status: Due, Education has been provided regarding the importance of this vaccine. Advised may receive this vaccine at local pharmacy or Health Dept. Aware to provide a copy of the vaccination record if obtained from local pharmacy or Health Dept. Verbalized acceptance and understanding.  Flu Vaccine status: Due, Education has been provided regarding the importance of this vaccine. Advised may receive this vaccine at local pharmacy or Health Dept. Aware to provide a copy of the vaccination record if obtained from local pharmacy or Health Dept. Verbalized acceptance and understanding.  Pneumococcal vaccine status: Up to date  Covid-19 vaccine status: Information provided on how to obtain vaccines.   Qualifies for Shingles Vaccine? Yes   Zostavax completed Yes   Shingrix Completed?: No.    Education has been provided regarding the importance of this vaccine. Patient has been advised to call insurance company to determine out of pocket expense if they have not yet received this vaccine. Advised may also receive vaccine at local pharmacy or Health Dept. Verbalized acceptance and understanding.  Screening Tests Health Maintenance  Topic Date Due   Hepatitis C Screening  Never done   TETANUS/TDAP  Never done   Zoster Vaccines- Shingrix (1 of 2) Never done   COVID-19 Vaccine (3 - Pfizer risk series) 06/23/2019   INFLUENZA VACCINE  08/25/2020   HPV VACCINES  Aged Out    Health Maintenance  Health Maintenance Due  Topic Date Due   Hepatitis C Screening  Never done   TETANUS/TDAP  Never done   Zoster Vaccines- Shingrix (1 of 2) Never done   COVID-19 Vaccine (3 - Pfizer risk series) 06/23/2019   INFLUENZA VACCINE  08/25/2020    Colorectal cancer  screening: Type of screening: Colonoscopy. Completed 2012. Repeat every No longer required due to age. years  Lung Cancer Screening: (Low Dose CT Chest  recommended if Age 59-80 years, 30 pack-year currently smoking OR have quit w/in 15years.) does not qualify.     Additional Screening:  Hepatitis C Screening: does not qualify;   Vision Screening: Recommended annual ophthalmology exams for early detection of glaucoma and other disorders of the eye. Is the patient up to date with their annual eye exam?  No  Who is the provider or what is the name of the office in which the patient attends annual eye exams? Pt's provider has retired, does not want referral.  If pt is not established with a provider, would they like to be referred to a provider to establish care? No .   Dental Screening: Recommended annual dental exams for proper oral hygiene  Community Resource Referral / Chronic Care Management: CRR required this visit?  No   CCM required this visit?  No      Plan:     I have personally reviewed and noted the following in the patient's chart:   Medical and social history Use of alcohol, tobacco or illicit drugs  Current medications and supplements including opioid prescriptions. Patient is not currently taking opioid prescriptions. Functional ability and status Nutritional status Physical activity Advanced directives List of other physicians Hospitalizations, surgeries, and ER visits in previous 12 months Vitals Screenings to include cognitive, depression, and falls Referrals and appointments  In addition, I have reviewed and discussed with patient certain preventive protocols, quality metrics, and best practice recommendations. A written personalized care plan for preventive services as well as general preventive health recommendations were provided to patient.     Chriss Driver, LPN   [id number]   Nurse Notes: Pt states he is doing well. Currently under treatment for drop foot, sees Dr. Caprice Beaver. Pt states he had Colonoscopy in 2012 that was normal, he does not want to repeat. Pt states Eye MD has retired and does  not want a referral at this time.

## 2020-11-04 ENCOUNTER — Other Ambulatory Visit: Payer: Self-pay | Admitting: Family Medicine

## 2020-11-04 DIAGNOSIS — E782 Mixed hyperlipidemia: Secondary | ICD-10-CM

## 2020-11-05 DIAGNOSIS — I25118 Atherosclerotic heart disease of native coronary artery with other forms of angina pectoris: Secondary | ICD-10-CM | POA: Diagnosis not present

## 2020-11-05 DIAGNOSIS — Z6826 Body mass index (BMI) 26.0-26.9, adult: Secondary | ICD-10-CM | POA: Diagnosis not present

## 2020-11-05 DIAGNOSIS — Z9889 Other specified postprocedural states: Secondary | ICD-10-CM | POA: Diagnosis not present

## 2020-11-05 DIAGNOSIS — M21371 Foot drop, right foot: Secondary | ICD-10-CM | POA: Diagnosis not present

## 2020-11-05 DIAGNOSIS — I252 Old myocardial infarction: Secondary | ICD-10-CM | POA: Diagnosis not present

## 2020-11-05 DIAGNOSIS — M48061 Spinal stenosis, lumbar region without neurogenic claudication: Secondary | ICD-10-CM | POA: Diagnosis not present

## 2020-11-05 DIAGNOSIS — E261 Secondary hyperaldosteronism: Secondary | ICD-10-CM | POA: Diagnosis not present

## 2020-11-05 DIAGNOSIS — E119 Type 2 diabetes mellitus without complications: Secondary | ICD-10-CM | POA: Diagnosis not present

## 2020-11-05 DIAGNOSIS — Z951 Presence of aortocoronary bypass graft: Secondary | ICD-10-CM | POA: Diagnosis not present

## 2020-11-05 DIAGNOSIS — I5030 Unspecified diastolic (congestive) heart failure: Secondary | ICD-10-CM | POA: Diagnosis not present

## 2020-11-11 DIAGNOSIS — N182 Chronic kidney disease, stage 2 (mild): Secondary | ICD-10-CM | POA: Diagnosis not present

## 2020-11-11 DIAGNOSIS — E2609 Other primary hyperaldosteronism: Secondary | ICD-10-CM | POA: Diagnosis not present

## 2020-11-11 DIAGNOSIS — E876 Hypokalemia: Secondary | ICD-10-CM | POA: Diagnosis not present

## 2020-11-11 DIAGNOSIS — D638 Anemia in other chronic diseases classified elsewhere: Secondary | ICD-10-CM | POA: Diagnosis not present

## 2020-11-11 DIAGNOSIS — Z79899 Other long term (current) drug therapy: Secondary | ICD-10-CM | POA: Diagnosis not present

## 2020-11-11 DIAGNOSIS — R1909 Other intra-abdominal and pelvic swelling, mass and lump: Secondary | ICD-10-CM | POA: Diagnosis not present

## 2020-11-11 DIAGNOSIS — I5032 Chronic diastolic (congestive) heart failure: Secondary | ICD-10-CM | POA: Diagnosis not present

## 2020-11-11 DIAGNOSIS — I129 Hypertensive chronic kidney disease with stage 1 through stage 4 chronic kidney disease, or unspecified chronic kidney disease: Secondary | ICD-10-CM | POA: Diagnosis not present

## 2020-11-14 DIAGNOSIS — E2609 Other primary hyperaldosteronism: Secondary | ICD-10-CM | POA: Diagnosis not present

## 2020-11-14 DIAGNOSIS — R1909 Other intra-abdominal and pelvic swelling, mass and lump: Secondary | ICD-10-CM | POA: Diagnosis not present

## 2020-11-14 DIAGNOSIS — R809 Proteinuria, unspecified: Secondary | ICD-10-CM | POA: Diagnosis not present

## 2020-11-14 DIAGNOSIS — I5032 Chronic diastolic (congestive) heart failure: Secondary | ICD-10-CM | POA: Diagnosis not present

## 2020-11-14 DIAGNOSIS — N182 Chronic kidney disease, stage 2 (mild): Secondary | ICD-10-CM | POA: Diagnosis not present

## 2020-11-21 DIAGNOSIS — M21371 Foot drop, right foot: Secondary | ICD-10-CM | POA: Diagnosis not present

## 2020-11-21 DIAGNOSIS — M5417 Radiculopathy, lumbosacral region: Secondary | ICD-10-CM | POA: Diagnosis not present

## 2020-12-04 ENCOUNTER — Other Ambulatory Visit: Payer: Self-pay | Admitting: Family Medicine

## 2020-12-04 DIAGNOSIS — E782 Mixed hyperlipidemia: Secondary | ICD-10-CM

## 2020-12-04 NOTE — Telephone Encounter (Signed)
Pt has not had lipid panel since 05/2019. My Chart message sent to pt asking him to schedule follow up visit with fasting labs. Rx sent to pharmacy.

## 2020-12-11 DIAGNOSIS — M21371 Foot drop, right foot: Secondary | ICD-10-CM | POA: Diagnosis not present

## 2020-12-22 DIAGNOSIS — M5417 Radiculopathy, lumbosacral region: Secondary | ICD-10-CM | POA: Diagnosis not present

## 2020-12-22 DIAGNOSIS — M21371 Foot drop, right foot: Secondary | ICD-10-CM | POA: Diagnosis not present

## 2021-01-02 ENCOUNTER — Other Ambulatory Visit: Payer: Self-pay | Admitting: Family Medicine

## 2021-01-02 DIAGNOSIS — L603 Nail dystrophy: Secondary | ICD-10-CM | POA: Diagnosis not present

## 2021-01-02 DIAGNOSIS — E782 Mixed hyperlipidemia: Secondary | ICD-10-CM

## 2021-01-02 DIAGNOSIS — M79674 Pain in right toe(s): Secondary | ICD-10-CM | POA: Diagnosis not present

## 2021-01-21 DIAGNOSIS — M5417 Radiculopathy, lumbosacral region: Secondary | ICD-10-CM | POA: Diagnosis not present

## 2021-01-21 DIAGNOSIS — M21371 Foot drop, right foot: Secondary | ICD-10-CM | POA: Diagnosis not present

## 2021-01-28 DIAGNOSIS — E119 Type 2 diabetes mellitus without complications: Secondary | ICD-10-CM | POA: Diagnosis not present

## 2021-01-28 DIAGNOSIS — I2510 Atherosclerotic heart disease of native coronary artery without angina pectoris: Secondary | ICD-10-CM | POA: Diagnosis not present

## 2021-01-28 DIAGNOSIS — Z951 Presence of aortocoronary bypass graft: Secondary | ICD-10-CM | POA: Diagnosis not present

## 2021-01-28 DIAGNOSIS — I5030 Unspecified diastolic (congestive) heart failure: Secondary | ICD-10-CM | POA: Diagnosis not present

## 2021-01-28 DIAGNOSIS — Z8679 Personal history of other diseases of the circulatory system: Secondary | ICD-10-CM | POA: Diagnosis not present

## 2021-01-28 DIAGNOSIS — E896 Postprocedural adrenocortical (-medullary) hypofunction: Secondary | ICD-10-CM | POA: Diagnosis not present

## 2021-01-28 DIAGNOSIS — I4891 Unspecified atrial fibrillation: Secondary | ICD-10-CM | POA: Diagnosis not present

## 2021-01-28 DIAGNOSIS — I252 Old myocardial infarction: Secondary | ICD-10-CM | POA: Diagnosis not present

## 2021-01-28 DIAGNOSIS — M21371 Foot drop, right foot: Secondary | ICD-10-CM | POA: Diagnosis not present

## 2021-01-28 DIAGNOSIS — Z9889 Other specified postprocedural states: Secondary | ICD-10-CM | POA: Diagnosis not present

## 2021-01-30 DIAGNOSIS — M5417 Radiculopathy, lumbosacral region: Secondary | ICD-10-CM | POA: Diagnosis not present

## 2021-01-30 DIAGNOSIS — M21371 Foot drop, right foot: Secondary | ICD-10-CM | POA: Diagnosis not present

## 2021-02-03 ENCOUNTER — Other Ambulatory Visit: Payer: Self-pay | Admitting: Family Medicine

## 2021-02-03 DIAGNOSIS — E782 Mixed hyperlipidemia: Secondary | ICD-10-CM

## 2021-02-16 ENCOUNTER — Ambulatory Visit: Payer: PPO | Admitting: Orthopedic Surgery

## 2021-02-16 ENCOUNTER — Other Ambulatory Visit: Payer: Self-pay

## 2021-02-16 ENCOUNTER — Encounter: Payer: Self-pay | Admitting: Orthopedic Surgery

## 2021-02-16 ENCOUNTER — Ambulatory Visit: Payer: PPO

## 2021-02-16 VITALS — BP 165/89 | HR 66 | Ht 66.0 in | Wt 166.0 lb

## 2021-02-16 DIAGNOSIS — M1711 Unilateral primary osteoarthritis, right knee: Secondary | ICD-10-CM | POA: Diagnosis not present

## 2021-02-16 DIAGNOSIS — Z9889 Other specified postprocedural states: Secondary | ICD-10-CM | POA: Diagnosis not present

## 2021-02-16 DIAGNOSIS — M25551 Pain in right hip: Secondary | ICD-10-CM | POA: Diagnosis not present

## 2021-02-16 DIAGNOSIS — M6701 Short Achilles tendon (acquired), right ankle: Secondary | ICD-10-CM | POA: Diagnosis not present

## 2021-02-16 DIAGNOSIS — G8929 Other chronic pain: Secondary | ICD-10-CM

## 2021-02-16 DIAGNOSIS — M25561 Pain in right knee: Secondary | ICD-10-CM

## 2021-02-16 NOTE — Progress Notes (Signed)
Chief Complaint  Patient presents with   Knee Pain    Right   Hip Pain    Right,    81 year old male status post arthroscopy right knee years ago also had a L4-S1 foraminotomy discectomy for foot drop which never really recovered he is currently in a dynamic AFO  He complains of abnormal motion in his right knee he says it wants to bend all the time and want to go into extension  He also has some right sided lower back pain and hip pain but denies any groin pain  Review of systems occasional right knee discomfort no chest pain or shortness of breath  Past Medical History:  Diagnosis Date   Adrenal mass (HCC)    Right, benign Conn's Syndrome   Arthritis    CAD (coronary artery disease)    Multivessel/left main status post CABG 12/2016   Dysrhythmia    afib after surgery   Essential hypertension    Gynecomastia    Hyperaldosteronism    Hypertension    Phreesia 07/21/2019   Lumbar radiculopathy    Myocardial infarction ([place])    Phreesia 07/21/2019   Pneumonia    Postoperative atrial fibrillation ([place])    Pre-diabetes    Spinal stenosis    Temporomandibular joint disease    Wears glasses    Past Surgical History:  Procedure Laterality Date   ADRENAL GLAND SURGERY  10/17/2019   CATARACT EXTRACTION W/PHACO Right 01/28/2015   Procedure: CATARACT EXTRACTION PHACO AND INTRAOCULAR LENS PLACEMENT RIGHT EYE;  Surgeon: Williams Che, MD;  [facility];  Service: Ophthalmology;  Laterality: Right;  CDE:27.23   CATARACT EXTRACTION W/PHACO Left 04/14/2015   Procedure: CATARACT EXTRACTION PHACO AND INTRAOCULAR LENS PLACEMENT LEFT EYE CDE=15.86;  Surgeon: Williams Che, MD;  [facility];  Service: Ophthalmology;  Laterality: Left;   [place]   COLONOSCOPY     CORONARY ARTERY BYPASS GRAFT N/A 01/12/2017   Procedure: CORONARY ARTERY BYPASS GRAFTING (CABG);  Surgeon: Grace Isaac, MD;  Location: [place];  Service: Open Heart Surgery;   Laterality: N/A;  Times 3 using left internal mammary artery to LAD and endoscopically harvested right thigh saphenous vein to OM and PD   EYE SURGERY N/A    Phreesia 07/21/2019   KNEE ARTHROSCOPY Right 10/09/2013   Procedure: RIGHT ARTHROSCOPY KNEE Partial medial and lateral meniscal tear and chondroplasty;  Surgeon: Hessie Dibble, MD;  Location: [place];  Service: Orthopedics;  Laterality: Right;  Partial medial and lateral meniscal tear and chondroplasty    LEFT HEART CATH AND CORONARY ANGIOGRAPHY N/A 01/11/2017   Procedure: LEFT HEART CATH AND CORONARY ANGIOGRAPHY;  Surgeon: Jettie Booze, MD;  Location: [place] [facility];  Service: Cardiovascular;  Laterality: N/A;   LUMBAR LAMINECTOMY/DECOMPRESSION MICRODISCECTOMY Right 09/20/2019   Procedure: Right Lumbar four- Lumbar five Lumbar five Sacral one Laminectomy/Foraminotomy;  Surgeon: Kristeen Miss, MD;  Location: [place];  Service: Neurosurgery;  Laterality: Right;   ROBOTIC ADRENALECTOMY Right 10/17/2019   Procedure: XI ROBOTIC ADRENALECTOMY;  Surgeon: Ralene Ok, MD;  Location: [place];  Service: General;  Laterality: Right;   TEE WITHOUT CARDIOVERSION N/A 01/12/2017   Procedure: TRANSESOPHAGEAL ECHOCARDIOGRAM (TEE);  Surgeon: Grace Isaac, MD;  Location: [place];  Service: Open Heart Surgery;  Laterality: N/A;   TONSILLECTOMY     VASECTOMY N/A    Phreesia 07/21/2019   .vs BP (!) 165/89    Pulse 66  Ht 5\' 6"  (1.676 m)    Wt 166 lb (75.3 kg)    BMI 26.79 kg/m   He is awake alert and oriented x3  His mood and affect are normal  He has a normal frame normal weight normal grooming  Cardiovascular no peripheral edema  Right lower extremity normal motion in the hip no pain with abduction flexion internal rotation  He does flex the knee when the foot is dorsiflexed but the knee does go to extension.  He has a flexion contracture/Achilles tendon contracture in the ankle area accounting for the knee  going into flexion to get the foot on the ground  No effusion in the knee no motion abnormalities in the knee no instability in the knee  I x-rayed the hip found no arthritis and x-ray of the knee and found moderate lateral compartment arthritis  Recommend stretching as he is not amenable to surgery because he is a primary caregiver for a full care that he needs to provide for his wife  's op report for his knee was evaluated as well as his op report for his surgery on his back  [place] Dr. Ellene Route performed right sided laminotomy and foraminotomies at L4-5 with decompression of L5 nerve root and right sided laminotomy and foraminotomies L5-S1 with discectomy decompression of S1 nerve root on September 20, 2019 and then in 2015 Dr. Demetrius Revel did a right knee partial medial and partial lateral meniscectomy with right knee abrasion chondroplasty was done on the trochlea the meniscus tear was primarily posterior horn he indicates 25% partial lateral meniscectomy with a grade 4 change on the tibial plateau and lateral femoral condyle he did a 5% medial meniscectomy  At this point he is just going to do stretching he will see [place] on an as-needed basis no surgical intervention needed at this time  Encounter Diagnoses  Name Primary?   Chronic pain of right knee Yes   Chronic hip pain, right    Primary osteoarthritis of right knee    Status post lumbar surgery    Contracture of right Achilles tendon

## 2021-03-12 DIAGNOSIS — L603 Nail dystrophy: Secondary | ICD-10-CM | POA: Diagnosis not present

## 2021-03-12 DIAGNOSIS — M79674 Pain in right toe(s): Secondary | ICD-10-CM | POA: Diagnosis not present

## 2021-03-13 DIAGNOSIS — E559 Vitamin D deficiency, unspecified: Secondary | ICD-10-CM | POA: Diagnosis not present

## 2021-03-13 DIAGNOSIS — E2609 Other primary hyperaldosteronism: Secondary | ICD-10-CM | POA: Diagnosis not present

## 2021-03-13 DIAGNOSIS — I5032 Chronic diastolic (congestive) heart failure: Secondary | ICD-10-CM | POA: Diagnosis not present

## 2021-03-13 DIAGNOSIS — R1909 Other intra-abdominal and pelvic swelling, mass and lump: Secondary | ICD-10-CM | POA: Diagnosis not present

## 2021-03-13 DIAGNOSIS — R809 Proteinuria, unspecified: Secondary | ICD-10-CM | POA: Diagnosis not present

## 2021-03-13 DIAGNOSIS — N182 Chronic kidney disease, stage 2 (mild): Secondary | ICD-10-CM | POA: Diagnosis not present

## 2021-03-18 DIAGNOSIS — I5032 Chronic diastolic (congestive) heart failure: Secondary | ICD-10-CM | POA: Diagnosis not present

## 2021-03-18 DIAGNOSIS — E2609 Other primary hyperaldosteronism: Secondary | ICD-10-CM | POA: Diagnosis not present

## 2021-03-18 DIAGNOSIS — R809 Proteinuria, unspecified: Secondary | ICD-10-CM | POA: Diagnosis not present

## 2021-03-18 DIAGNOSIS — I129 Hypertensive chronic kidney disease with stage 1 through stage 4 chronic kidney disease, or unspecified chronic kidney disease: Secondary | ICD-10-CM | POA: Diagnosis not present

## 2021-03-18 DIAGNOSIS — N182 Chronic kidney disease, stage 2 (mild): Secondary | ICD-10-CM | POA: Diagnosis not present

## 2021-03-25 DIAGNOSIS — C44519 Basal cell carcinoma of skin of other part of trunk: Secondary | ICD-10-CM | POA: Diagnosis not present

## 2021-03-25 DIAGNOSIS — D485 Neoplasm of uncertain behavior of skin: Secondary | ICD-10-CM | POA: Diagnosis not present

## 2021-03-25 DIAGNOSIS — Z85828 Personal history of other malignant neoplasm of skin: Secondary | ICD-10-CM | POA: Diagnosis not present

## 2021-03-25 DIAGNOSIS — Z1283 Encounter for screening for malignant neoplasm of skin: Secondary | ICD-10-CM | POA: Diagnosis not present

## 2021-03-25 DIAGNOSIS — L570 Actinic keratosis: Secondary | ICD-10-CM | POA: Diagnosis not present

## 2021-04-16 DIAGNOSIS — C44519 Basal cell carcinoma of skin of other part of trunk: Secondary | ICD-10-CM | POA: Diagnosis not present

## 2021-04-30 DIAGNOSIS — C44519 Basal cell carcinoma of skin of other part of trunk: Secondary | ICD-10-CM | POA: Diagnosis not present

## 2021-05-11 ENCOUNTER — Other Ambulatory Visit: Payer: Self-pay

## 2021-05-11 ENCOUNTER — Other Ambulatory Visit: Payer: Self-pay | Admitting: Family Medicine

## 2021-05-11 DIAGNOSIS — E782 Mixed hyperlipidemia: Secondary | ICD-10-CM

## 2021-05-21 DIAGNOSIS — M79674 Pain in right toe(s): Secondary | ICD-10-CM | POA: Diagnosis not present

## 2021-05-21 DIAGNOSIS — L603 Nail dystrophy: Secondary | ICD-10-CM | POA: Diagnosis not present

## 2021-06-08 DIAGNOSIS — I5030 Unspecified diastolic (congestive) heart failure: Secondary | ICD-10-CM | POA: Diagnosis not present

## 2021-06-08 DIAGNOSIS — M21371 Foot drop, right foot: Secondary | ICD-10-CM | POA: Diagnosis not present

## 2021-06-08 DIAGNOSIS — I25118 Atherosclerotic heart disease of native coronary artery with other forms of angina pectoris: Secondary | ICD-10-CM | POA: Diagnosis not present

## 2021-06-08 DIAGNOSIS — I252 Old myocardial infarction: Secondary | ICD-10-CM | POA: Diagnosis not present

## 2021-06-08 DIAGNOSIS — G544 Lumbosacral root disorders, not elsewhere classified: Secondary | ICD-10-CM | POA: Diagnosis not present

## 2021-06-08 DIAGNOSIS — Z951 Presence of aortocoronary bypass graft: Secondary | ICD-10-CM | POA: Diagnosis not present

## 2021-06-08 DIAGNOSIS — I4891 Unspecified atrial fibrillation: Secondary | ICD-10-CM | POA: Diagnosis not present

## 2021-06-08 DIAGNOSIS — M48061 Spinal stenosis, lumbar region without neurogenic claudication: Secondary | ICD-10-CM | POA: Diagnosis not present

## 2021-06-08 DIAGNOSIS — Z6826 Body mass index (BMI) 26.0-26.9, adult: Secondary | ICD-10-CM | POA: Diagnosis not present

## 2021-06-08 DIAGNOSIS — E261 Secondary hyperaldosteronism: Secondary | ICD-10-CM | POA: Diagnosis not present

## 2021-06-08 DIAGNOSIS — E119 Type 2 diabetes mellitus without complications: Secondary | ICD-10-CM | POA: Diagnosis not present

## 2021-06-08 DIAGNOSIS — Z981 Arthrodesis status: Secondary | ICD-10-CM | POA: Diagnosis not present

## 2021-06-11 DIAGNOSIS — I5032 Chronic diastolic (congestive) heart failure: Secondary | ICD-10-CM | POA: Diagnosis not present

## 2021-06-11 DIAGNOSIS — N182 Chronic kidney disease, stage 2 (mild): Secondary | ICD-10-CM | POA: Diagnosis not present

## 2021-06-11 DIAGNOSIS — E2609 Other primary hyperaldosteronism: Secondary | ICD-10-CM | POA: Diagnosis not present

## 2021-06-11 DIAGNOSIS — R809 Proteinuria, unspecified: Secondary | ICD-10-CM | POA: Diagnosis not present

## 2021-06-11 DIAGNOSIS — I129 Hypertensive chronic kidney disease with stage 1 through stage 4 chronic kidney disease, or unspecified chronic kidney disease: Secondary | ICD-10-CM | POA: Diagnosis not present

## 2021-06-11 DIAGNOSIS — I517 Cardiomegaly: Secondary | ICD-10-CM | POA: Diagnosis not present

## 2021-06-11 DIAGNOSIS — D638 Anemia in other chronic diseases classified elsewhere: Secondary | ICD-10-CM | POA: Diagnosis not present

## 2021-06-11 DIAGNOSIS — Z79899 Other long term (current) drug therapy: Secondary | ICD-10-CM | POA: Diagnosis not present

## 2021-06-17 DIAGNOSIS — E2609 Other primary hyperaldosteronism: Secondary | ICD-10-CM | POA: Diagnosis not present

## 2021-06-17 DIAGNOSIS — E896 Postprocedural adrenocortical (-medullary) hypofunction: Secondary | ICD-10-CM | POA: Diagnosis not present

## 2021-06-17 DIAGNOSIS — I5032 Chronic diastolic (congestive) heart failure: Secondary | ICD-10-CM | POA: Diagnosis not present

## 2021-06-17 DIAGNOSIS — I129 Hypertensive chronic kidney disease with stage 1 through stage 4 chronic kidney disease, or unspecified chronic kidney disease: Secondary | ICD-10-CM | POA: Diagnosis not present

## 2021-06-17 DIAGNOSIS — N182 Chronic kidney disease, stage 2 (mild): Secondary | ICD-10-CM | POA: Diagnosis not present

## 2021-06-17 DIAGNOSIS — R809 Proteinuria, unspecified: Secondary | ICD-10-CM | POA: Diagnosis not present

## 2021-06-23 ENCOUNTER — Emergency Department (HOSPITAL_COMMUNITY)
Admission: EM | Admit: 2021-06-23 | Discharge: 2021-06-24 | Disposition: A | Discharge status: Home / Self Care | Payer: PPO | Attending: Emergency Medicine | Admitting: Emergency Medicine

## 2021-06-23 ENCOUNTER — Emergency Department (HOSPITAL_COMMUNITY): Payer: PPO

## 2021-06-23 ENCOUNTER — Encounter (HOSPITAL_COMMUNITY): Payer: Self-pay

## 2021-06-23 ENCOUNTER — Other Ambulatory Visit: Payer: Self-pay

## 2021-06-23 DIAGNOSIS — I4891 Unspecified atrial fibrillation: Secondary | ICD-10-CM | POA: Diagnosis not present

## 2021-06-23 DIAGNOSIS — Z79899 Other long term (current) drug therapy: Secondary | ICD-10-CM | POA: Insufficient documentation

## 2021-06-23 DIAGNOSIS — K6289 Other specified diseases of anus and rectum: Secondary | ICD-10-CM | POA: Diagnosis not present

## 2021-06-23 DIAGNOSIS — R197 Diarrhea, unspecified: Secondary | ICD-10-CM | POA: Diagnosis present

## 2021-06-23 DIAGNOSIS — Z951 Presence of aortocoronary bypass graft: Secondary | ICD-10-CM | POA: Diagnosis not present

## 2021-06-23 DIAGNOSIS — R0602 Shortness of breath: Secondary | ICD-10-CM | POA: Diagnosis not present

## 2021-06-23 DIAGNOSIS — R69 Illness, unspecified: Secondary | ICD-10-CM | POA: Diagnosis not present

## 2021-06-23 DIAGNOSIS — R002 Palpitations: Secondary | ICD-10-CM | POA: Insufficient documentation

## 2021-06-23 DIAGNOSIS — I700 Atherosclerosis of aorta: Secondary | ICD-10-CM | POA: Diagnosis not present

## 2021-06-23 DIAGNOSIS — Z7982 Long term (current) use of aspirin: Secondary | ICD-10-CM | POA: Diagnosis not present

## 2021-06-23 DIAGNOSIS — I10 Essential (primary) hypertension: Secondary | ICD-10-CM | POA: Diagnosis not present

## 2021-06-23 DIAGNOSIS — R5381 Other malaise: Secondary | ICD-10-CM | POA: Diagnosis not present

## 2021-06-23 DIAGNOSIS — R339 Retention of urine, unspecified: Secondary | ICD-10-CM | POA: Insufficient documentation

## 2021-06-23 DIAGNOSIS — K5730 Diverticulosis of large intestine without perforation or abscess without bleeding: Secondary | ICD-10-CM | POA: Diagnosis not present

## 2021-06-23 LAB — URINALYSIS, ROUTINE W REFLEX MICROSCOPIC
Bilirubin Urine: NEGATIVE
Glucose, UA: NEGATIVE mg/dL
Hgb urine dipstick: NEGATIVE
Ketones, ur: 5 mg/dL — AB
Leukocytes,Ua: NEGATIVE
Nitrite: NEGATIVE
Protein, ur: NEGATIVE mg/dL
Specific Gravity, Urine: 1.015 (ref 1.005–1.030)
pH: 6 (ref 5.0–8.0)

## 2021-06-23 LAB — CBC WITH DIFFERENTIAL/PLATELET
Abs Immature Granulocytes: 0.06 10*3/uL (ref 0.00–0.07)
Basophils Absolute: 0.1 10*3/uL (ref 0.0–0.1)
Basophils Relative: 0 %
Eosinophils Absolute: 0.2 10*3/uL (ref 0.0–0.5)
Eosinophils Relative: 1 %
HCT: 42.7 % (ref 39.0–52.0)
Hemoglobin: 14.7 g/dL (ref 13.0–17.0)
Immature Granulocytes: 0 %
Lymphocytes Relative: 8 %
Lymphs Abs: 1.5 10*3/uL (ref 0.7–4.0)
MCH: 30.5 pg (ref 26.0–34.0)
MCHC: 34.4 g/dL (ref 30.0–36.0)
MCV: 88.6 fL (ref 80.0–100.0)
Monocytes Absolute: 1 10*3/uL (ref 0.1–1.0)
Monocytes Relative: 6 %
Neutro Abs: 14.9 10*3/uL — ABNORMAL HIGH (ref 1.7–7.7)
Neutrophils Relative %: 85 %
Platelets: 243 10*3/uL (ref 150–400)
RBC: 4.82 MIL/uL (ref 4.22–5.81)
RDW: 12.1 % (ref 11.5–15.5)
WBC: 17.8 10*3/uL — ABNORMAL HIGH (ref 4.0–10.5)
nRBC: 0 % (ref 0.0–0.2)

## 2021-06-23 LAB — BASIC METABOLIC PANEL
Anion gap: 8 (ref 5–15)
BUN: 15 mg/dL (ref 8–23)
CO2: 25 mmol/L (ref 22–32)
Calcium: 9.1 mg/dL (ref 8.9–10.3)
Chloride: 103 mmol/L (ref 98–111)
Creatinine, Ser: 0.88 mg/dL (ref 0.61–1.24)
GFR, Estimated: >60 mL/min (ref >60)
Glucose, Bld: 115 mg/dL — ABNORMAL HIGH (ref 70–99)
Potassium: 3.6 mmol/L (ref 3.5–5.1)
Sodium: 136 mmol/L (ref 135–145)

## 2021-06-23 LAB — TROPONIN I (HIGH SENSITIVITY)
Troponin I (High Sensitivity): 6 ng/L (ref <18)
Troponin I (High Sensitivity): 6 ng/L (ref <18)

## 2021-06-23 MED ORDER — CIPROFLOXACIN HCL 500 MG PO TABS: 500.0000 mg | ORAL_TABLET | Freq: Two times a day (BID) | ORAL | 0 refills | Status: AC

## 2021-06-23 MED ORDER — IOHEXOL 300 MG/ML  SOLN
100.0000 mL | Freq: Once | INTRAMUSCULAR | Status: AC | PRN
  Administered 2021-06-23: 100 mL via INTRAVENOUS

## 2021-06-23 MED ORDER — CIPROFLOXACIN HCL 250 MG PO TABS
500.0000 mg | ORAL_TABLET | Freq: Once | ORAL | Status: AC
  Administered 2021-06-23: 500 mg via ORAL
  Filled 2021-06-23: qty 2

## 2021-06-23 MED ORDER — OXYCODONE HCL 5 MG PO TABS: 5.0000 mg | ORAL_TABLET | ORAL | Status: DC | PRN

## 2021-06-23 MED ORDER — ACETAMINOPHEN 325 MG PO TABS
650.0000 mg | ORAL_TABLET | Freq: Once | ORAL | Status: AC
  Administered 2021-06-23: 650 mg via ORAL
  Filled 2021-06-23: qty 2

## 2021-06-23 NOTE — ED Notes (Signed)
Patient states that he can't use the bathroom and is screaming in pain.  Advised provider of the situation and that the patient's abdomen is distended.  Provider states to go ahead and cath the patient.

## 2021-06-23 NOTE — ED Provider Notes (Signed)
[facility] EMERGENCY DEPARTMENT Provider Note   CSN: [id number] Arrival date & time: 06/23/21  1643     History  Chief Complaint  Patient presents with   Palpitations   Diarrhea    Christopher Booth is a 81 y.o. male.  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 Sherryle Lis presenting for complaints of palpitations and urinary retention.  Patient admits to intermittent palpitations and irregular heartbeat over the past 1 to 2 months.  Has history of postop A-fib.  No A-fib since.  Is not anticoagulated.  Does not take rate control medications.  Patient also admits to diarrhea leaking of stool over the last 2 days.  Denies any fevers, chills, nausea, vomiting.  Patient also admits to new abdominal pressure and inability to empty bladder.  Patient in A-fib with stable rate as per EMS. Retaining over 1000 cc of urine on arrival.   Atrial Fibrillation Associated symptoms include abdominal pain. Pertinent negatives include no chest pain and no shortness of breath.      Home Medications Prior to Admission medications   Medication Sig Start Date End Date Taking? Authorizing Provider  aspirin 81 MG EC tablet Take 81 mg by mouth daily.   Yes [provider]  atorvastatin (LIPITOR) 20 MG tablet TAKE ONE TABLET BY MOUTH ONCE DAILY. 05/11/21  Yes Susy Frizzle, MD  carvedilol (COREG) 12.5 MG tablet Take 12.5 mg by mouth in the morning and at bedtime.   Yes [provider]  ciprofloxacin (CIPRO) 500 MG tablet Take 1 tablet (500 mg total) by mouth every 12 (twelve) hours for 14 days. 06/23/21 0/86/57 Yes Campbell Stall P, DO  diphenhydramine-acetaminophen (TYLENOL PM) 25-500 MG TABS tablet Take 1 tablet by mouth at bedtime as needed.   Yes [provider]  eplerenone (INSPRA) 25 MG tablet Take 50 mg by mouth 2 (two)  times daily. 03/31/21  Yes [provider]  losartan (COZAAR) 25 MG tablet Take 1 tablet (25 mg total) by mouth daily. 09/30/20  Yes Susy Frizzle, MD  Multiple Vitamin (MULTIVITAMIN WITH MINERALS) TABS tablet Take 1 tablet by mouth daily.   Yes [provider]  potassium chloride SA (KLOR-CON) 20 MEQ tablet Take 60 mEq by mouth daily. 09/21/19  Yes [provider]  telmisartan-hydrochlorothiazide (MICARDIS HCT) 80-12.5 MG per tablet Take 1 tablet by mouth daily.  04/13/11  [provider]      Allergies    Diovan [valsartan]    Review of Systems   Review of Systems  Constitutional:  Negative for chills and fever.  HENT:  Negative for ear pain and sore throat.   Eyes:  Negative for pain and visual disturbance.  Respiratory:  Negative for cough and shortness of breath.   Cardiovascular:  Positive for palpitations. Negative for chest pain.  Gastrointestinal:  Positive for abdominal pain and diarrhea. Negative for vomiting.  Genitourinary:  Positive for decreased urine volume. Negative for dysuria and hematuria.  Musculoskeletal:  Negative for arthralgias and back pain.  Skin:  Negative for color change and rash.  Neurological:  Negative for seizures and syncope.  All other systems reviewed and are negative.  Physical Exam Updated Vital Signs BP 133/69   Pulse 73   Temp 97.9 F (36.6 C) (Oral)   Resp 20   Ht '5\' 6"'$  (1.676 m)   Wt 77.1 kg   SpO2 96%  BMI 27.44 kg/m  Physical Exam Vitals and nursing note reviewed.  Constitutional:      General: He is not in acute distress.    Appearance: He is well-developed.  HENT:     Head: Normocephalic and atraumatic.  Eyes:     Conjunctiva/sclera: Conjunctivae normal.  Cardiovascular:     Rate and Rhythm: Normal rate and regular rhythm.     Heart sounds: No murmur heard. Pulmonary:     Effort: Pulmonary effort is normal. No respiratory distress.     Breath sounds: Normal breath sounds.  Abdominal:      Palpations: Abdomen is soft.     Tenderness: There is abdominal tenderness in the suprapubic area. There is guarding.  Musculoskeletal:        General: No swelling.     Cervical back: Neck supple.  Skin:    General: Skin is warm and dry.     Capillary Refill: Capillary refill takes less than 2 seconds.  Neurological:     Mental Status: He is alert.  Psychiatric:        Mood and Affect: Mood normal.    ED Results / Procedures / Treatments   Labs (all labs ordered are listed, but only abnormal results are displayed) Labs Reviewed  URINALYSIS, ROUTINE W REFLEX MICROSCOPIC - Abnormal; Notable for the following components:      Result Value   Ketones, ur 5 (*)    All other components within normal limits  CBC WITH DIFFERENTIAL/PLATELET - Abnormal; Notable for the following components:   WBC 17.8 (*)    Neutro Abs 14.9 (*)    All other components within normal limits  BASIC METABOLIC PANEL - Abnormal; Notable for the following components:   Glucose, Bld 115 (*)    All other components within normal limits  TROPONIN I (HIGH SENSITIVITY)  TROPONIN I (HIGH SENSITIVITY)    EKG EKG Interpretation  Date/Time:  Tuesday Jun 23 2021 19:23:40 EDT Ventricular Rate:  76 PR Interval:  189 QRS Duration: 100 QT Interval:  399 QTC Calculation: 449 R Axis:   -64 Text Interpretation: Sinus rhythm Probable left atrial enlargement Left anterior fascicular block Probable anterior infarct, age indeterminate ST elevation, consider inferior injury Baseline wander in lead(s) V3 V4 Confirmed by Campbell Stall ([id number]) on 1/69/4503 9:22:43 PM  Radiology No results found.  Procedures Procedures    Medications Ordered in ED Medications  acetaminophen (TYLENOL) tablet 650 mg (650 mg Oral Given 06/23/21 2135)  iohexol (OMNIPAQUE) 300 MG/ML solution 100 mL (100 mLs Intravenous Contrast Given 06/23/21 2156)  ciprofloxacin (CIPRO) tablet 500 mg (500 mg Oral Given 06/23/21 2301)    ED Course/ Medical  Decision Making/ A&P                           Medical Decision Making Amount and/or Complexity of Data Reviewed Labs: ordered. Radiology: ordered.  Risk OTC drugs. Prescription drug management.   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 Sherryle Lis presenting for complaints of palpitations and urinary retention.    EKG demonstrates normal sinus rhythm, stable rate, and frequent PVCs.  Able troponin and electrolytes.  Stable chest x-ray demonstrating no acute process. Stable troponin, delta trop, and electrolytes. No afib on telemetry of ECG. Pt recommended for close follow up with cardiology If palpitations return or prompt return to ED.   Patient admits to  history of diarrhea with fullness at the rectum.  New urinary retention with greater than 1000 mL. Indwelling foley cath insertion.  No urinary tract infection.  CT abdomen demonstrating proctitis.  Signs or symptoms of sepsis. Pt denies hx of anal penetration or oral-anal sex. Ciprofloxacin given in emergency department and sent to pharmacy.  Recommended for close follow-up with urology.  Patient in no distress and overall condition improved here in the ED. Detailed discussions were had with the patient regarding current findings, and need for close f/u with PCP or on call doctor. The patient has been instructed to return immediately if the symptoms worsen in any way for re-evaluation. Patient verbalized understanding and is in agreement with current care plan. All questions answered prior to discharge.         Final Clinical Impression(s) / ED Diagnoses Final diagnoses:  Proctitis  Urinary retention    Rx / DC Orders ED Discharge Orders          Ordered    ciprofloxacin (CIPRO) 500 MG tablet  Every 12 hours        06/23/21 2254              Lianne Cure,  DO 32/95/18 1519

## 2021-06-23 NOTE — ED Notes (Signed)
Patient transported to CT 

## 2021-06-23 NOTE — Discharge Instructions (Signed)
[facility] follow-up with urologist for proctitis and urinary retention

## 2021-06-23 NOTE — ED Triage Notes (Signed)
Patient called EMS for not being able to urinate.  EMS states that they placed the patient on the monitor and patient was in new onset of A-fib.  States that patient also showing some monomorphic PVCs.  Patient is conscious and alert at triage.

## 2021-07-01 ENCOUNTER — Encounter: Payer: Self-pay | Admitting: Urology

## 2021-07-01 ENCOUNTER — Ambulatory Visit: Payer: PPO | Admitting: Urology

## 2021-07-01 VITALS — BP 139/86 | HR 63 | Ht 66.0 in | Wt 170.0 lb

## 2021-07-01 DIAGNOSIS — R339 Retention of urine, unspecified: Secondary | ICD-10-CM | POA: Diagnosis not present

## 2021-07-01 DIAGNOSIS — N401 Enlarged prostate with lower urinary tract symptoms: Secondary | ICD-10-CM

## 2021-07-01 DIAGNOSIS — N138 Other obstructive and reflux uropathy: Secondary | ICD-10-CM

## 2021-07-01 NOTE — Progress Notes (Signed)
07/01/2021 8:49 AM   Christopher Booth 08-15-1940 [id number]  Referring provider: Susy Frizzle, MD [address] [place],  [place] [address]  Urinary retention   HPI: Christopher Booth is a 81yo here for evaluation of urinary retention. 06/23/2021 he had a feeling to have a bowel movement and then developed an inability to urinate. He presented to the ER and had a foley placed, over 1000cc drained. He is currently being treated for proctitis. IPSS 3 QOL 1. Nocturia 2x.  He does not have a hx of significant LUTS.  He has a hx of L4-L5 spine surgery in 2021. No prior hx of BPH therapy.    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, benign Conn's Syndrome   Arthritis    CAD (coronary artery disease)    Multivessel/left main status post CABG 12/2016   Dysrhythmia    afib after surgery   Essential hypertension    Gynecomastia    Hyperaldosteronism    Hypertension    Phreesia 07/21/2019   Lumbar radiculopathy    Myocardial infarction ([place])    Phreesia 07/21/2019   Pneumonia    Postoperative atrial fibrillation (HCC)    Pre-diabetes    Spinal stenosis    Temporomandibular joint disease    Wears glasses     Surgical History: Past Surgical History:  Procedure Laterality Date   ADRENAL GLAND SURGERY  10/17/2019   CATARACT EXTRACTION W/PHACO Right 01/28/2015   Procedure: CATARACT EXTRACTION PHACO AND INTRAOCULAR LENS PLACEMENT RIGHT EYE;  Surgeon: Williams Che, MD;  [facility]: [facility];  Service: Ophthalmology;  Laterality: Right;  CDE:27.23   CATARACT EXTRACTION W/PHACO Left 04/14/2015   Procedure: CATARACT EXTRACTION PHACO AND INTRAOCULAR LENS PLACEMENT LEFT EYE CDE=15.86;  Surgeon: Williams Che, MD;  Location: [facility];  Service: Ophthalmology;  Laterality: Left;   [place]   COLONOSCOPY     CORONARY ARTERY BYPASS GRAFT N/A 01/12/2017   Procedure: CORONARY ARTERY BYPASS GRAFTING (CABG);  Surgeon:  Grace Isaac, MD;  Location: [place];  Service: Open Heart Surgery;  Laterality: N/A;  Times 3 using left internal mammary artery to LAD and endoscopically harvested right thigh saphenous vein to OM and PD   EYE SURGERY N/A    Phreesia 07/21/2019   KNEE ARTHROSCOPY Right 10/09/2013   Procedure: RIGHT ARTHROSCOPY KNEE Partial medial and lateral meniscal tear and chondroplasty;  Surgeon: Hessie Dibble, MD;  Location: [place];  Service: Orthopedics;  Laterality: Right;  Partial medial and lateral meniscal tear and chondroplasty    LEFT HEART CATH AND CORONARY ANGIOGRAPHY N/A 01/11/2017   Procedure: LEFT HEART CATH AND CORONARY ANGIOGRAPHY;  Surgeon: Jettie Booze, MD;  Location: [place] [facility];  Service: Cardiovascular;  Laterality: N/A;   LUMBAR LAMINECTOMY/DECOMPRESSION MICRODISCECTOMY Right 09/20/2019   Procedure: Right Lumbar four- Lumbar five Lumbar five Sacral one Laminectomy/Foraminotomy;  Surgeon: Kristeen Miss, MD;  Location: [place];  Service: Neurosurgery;  Laterality: Right;   ROBOTIC ADRENALECTOMY Right 10/17/2019   Procedure: XI ROBOTIC ADRENALECTOMY;  Surgeon: Ralene Ok, MD;  Location: [place];  Service: General;  Laterality: Right;   TEE WITHOUT CARDIOVERSION N/A 01/12/2017   Procedure: TRANSESOPHAGEAL ECHOCARDIOGRAM (TEE);  Surgeon: Grace Isaac, MD;  Location: [place];  Service: Open Heart Surgery;  Laterality: N/A;   TONSILLECTOMY     VASECTOMY N/A    Phreesia 07/21/2019  Home Medications:  Allergies as of 07/01/2021       Reactions   Diovan [valsartan]    unknown        Medication List        Accurate as of July 01, 2021  8:49 AM. If you have any questions, ask your nurse or doctor.          aspirin EC 81 MG tablet Take 81 mg by mouth daily.   atorvastatin 20 MG tablet Commonly known as: LIPITOR TAKE ONE TABLET BY MOUTH ONCE DAILY.   carvedilol 12.5 MG tablet Commonly known as: COREG Take 12.5 mg by mouth in the  morning and at bedtime.   ciprofloxacin 500 MG tablet Commonly known as: CIPRO Take 1 tablet (500 mg total) by mouth every 12 (twelve) hours for 14 days.   diphenhydramine-acetaminophen 25-500 MG Tabs tablet Commonly known as: TYLENOL PM Take 1 tablet by mouth at bedtime as needed.   eplerenone 25 MG tablet Commonly known as: INSPRA Take 50 mg by mouth 2 (two) times daily.   losartan 25 MG tablet Commonly known as: Cozaar Take 1 tablet (25 mg total) by mouth daily.   multivitamin with minerals Tabs tablet Take 1 tablet by mouth daily.   potassium chloride SA 20 MEQ tablet Commonly known as: KLOR-CON M Take 60 mEq by mouth daily.        Allergies:  Allergies  Allergen Reactions   Diovan [Valsartan]     unknown    Family History: Family History  Problem Relation Age of Onset   Hypertension Other    Cancer Brother        Colon   Cancer Brother        Melanoma    Social History:  reports that he has never smoked. He has never used smokeless tobacco. He reports current alcohol use. He reports that he does not use drugs.  ROS: All other review of systems were reviewed and are negative except what is noted above in HPI  Physical Exam: BP 139/86   Pulse 63   Ht '5\' 6"'$  (1.676 m)   Wt 170 lb (77.1 kg)   BMI 27.44 kg/m   Constitutional:  Alert and oriented, No acute distress. HEENT:  AT, moist mucus membranes.  Trachea midline, no masses. Cardiovascular: No clubbing, cyanosis, or edema. Respiratory: Normal respiratory effort, no increased work of breathing. GI: Abdomen is soft, nontender, nondistended, no abdominal masses GU: No CVA tenderness. Circumcised phallus. No masses/lesions on penis, testis, scrotum. Prostate 20g smooth no nodules no induration.  Lymph: No cervical or inguinal lymphadenopathy. Skin: No rashes, bruises or suspicious lesions. Neurologic: Grossly intact, no focal deficits, moving all 4 extremities. Psychiatric: Normal mood and  affect.  Laboratory Data: Lab Results  Component Value Date   WBC 17.8 (H) 06/23/2021   HGB 14.7 06/23/2021   HCT 42.7 06/23/2021   MCV 88.6 06/23/2021   PLT 243 06/23/2021    Lab Results  Component Value Date   CREATININE 0.88 06/23/2021    No results found for: PSA  No results found for: TESTOSTERONE  Lab Results  Component Value Date   HGBA1C 6.0 (H) 09/23/2020    Urinalysis    Component Value Date/Time   COLORURINE YELLOW 06/23/2021 1721   APPEARANCEUR CLEAR 06/23/2021 1721   LABSPEC 1.015 06/23/2021 1721   PHURINE 6.0 06/23/2021 1721   GLUCOSEU NEGATIVE 06/23/2021 1721   HGBUR NEGATIVE 06/23/2021 1721   BILIRUBINUR NEGATIVE 06/23/2021 1721   KETONESUR 5 (  A) 06/23/2021 1721   PROTEINUR NEGATIVE 06/23/2021 1721   NITRITE NEGATIVE 06/23/2021 1721   LEUKOCYTESUR NEGATIVE 06/23/2021 1721    No results found for: LABMICR, WBCUA, RBCUA, LABEPIT, MUCUS, BACTERIA  Pertinent Imaging:  No results found for this or any previous visit.  No results found for this or any previous visit.  No results found for this or any previous visit.  No results found for this or any previous visit.  No results found for this or any previous visit.  No results found for this or any previous visit.  No results found for this or any previous visit.  No results found for this or any previous visit.   Assessment & Plan:    1. Urinary retention -voiding trial passed today. RTC 4 weeks with PVR  2. Benign prostatic hyperplasia with urinary obstruction -RTC 4 weeks with PVR   No follow-ups on file.  Nicolette Bang, MD  [facility] [place]

## 2021-07-01 NOTE — Progress Notes (Signed)
Fill and Pull Catheter Removal  Patient is present today for a catheter removal.  Patient was cleaned and prepped in a sterile fashion 143m of sterile water/ saline was instilled into the bladder when the patient felt the urge to urinate. 1102mof water was then drained from the balloon.  A 16FR foley cath was removed from the bladder no complications were noted .  Patient as then given some time to void on their own.  Patient can void  25049mn their own after some time.  Patient tolerated well.  Performed by: KouLevi AlandMA  Follow up/ Additional notes: Follow up as scheduled.

## 2021-07-01 NOTE — Patient Instructions (Signed)

## 2021-07-29 ENCOUNTER — Ambulatory Visit: Payer: PPO | Admitting: Urology

## 2021-07-29 ENCOUNTER — Encounter: Payer: Self-pay | Admitting: Urology

## 2021-07-29 VITALS — BP 162/79 | HR 58

## 2021-07-29 DIAGNOSIS — R339 Retention of urine, unspecified: Secondary | ICD-10-CM

## 2021-07-29 LAB — BLADDER SCAN AMB NON-IMAGING: Scan Result: 31

## 2021-07-29 NOTE — Progress Notes (Signed)
post void residual=31 

## 2021-07-29 NOTE — Progress Notes (Signed)
07/29/2021 2:03 PM   Christopher Booth 1940/09/23 [id number]  Referring provider: Susy Frizzle, MD [address] [place],  [place] [address]  Followup urinary retention   HPI: [name] is a 81yo here for followup for urinary retention. PVR 31cc. IPSS 3 QOL 0. Urine stream strong. Nocturia 2x. No other complaints tody   PMH: Past Medical History:  Diagnosis Date   Adrenal mass (HCC)    Right, benign Conn's Syndrome   Arthritis    CAD (coronary artery disease)    Multivessel/left main status post CABG 12/2016   Dysrhythmia    afib after surgery   Essential hypertension    Gynecomastia    Hyperaldosteronism    Hypertension    Phreesia 07/21/2019   Lumbar radiculopathy    Myocardial infarction ([place])    Phreesia 07/21/2019   Pneumonia    Postoperative atrial fibrillation (HCC)    Pre-diabetes    Spinal stenosis    Temporomandibular joint disease    Wears glasses     Surgical History: Past Surgical History:  Procedure Laterality Date   ADRENAL GLAND SURGERY  10/17/2019   CATARACT EXTRACTION W/PHACO Right 01/28/2015   Procedure: CATARACT EXTRACTION PHACO AND INTRAOCULAR LENS PLACEMENT RIGHT EYE;  Surgeon: Williams Che, MD;  [facility];  Service: Ophthalmology;  Laterality: Right;  CDE:27.23   CATARACT EXTRACTION W/PHACO Left 04/14/2015   Procedure: CATARACT EXTRACTION PHACO AND INTRAOCULAR LENS PLACEMENT LEFT EYE CDE=15.86;  Surgeon: Williams Che, MD;  [facility];  Service: Ophthalmology;  Laterality: Left;   [place]   COLONOSCOPY     CORONARY ARTERY BYPASS GRAFT N/A 01/12/2017   Procedure: CORONARY ARTERY BYPASS GRAFTING (CABG);  Surgeon: Grace Isaac, MD;  Location: [place];  Service: Open Heart Surgery;  Laterality: N/A;  Times 3 using left internal mammary artery to LAD and endoscopically harvested right thigh saphenous vein to OM and PD   EYE SURGERY N/A    Phreesia 07/21/2019   KNEE  ARTHROSCOPY Right 10/09/2013   Procedure: RIGHT ARTHROSCOPY KNEE Partial medial and lateral meniscal tear and chondroplasty;  Surgeon: Hessie Dibble, MD;  Location: [place];  Service: Orthopedics;  Laterality: Right;  Partial medial and lateral meniscal tear and chondroplasty    LEFT HEART CATH AND CORONARY ANGIOGRAPHY N/A 01/11/2017   Procedure: LEFT HEART CATH AND CORONARY ANGIOGRAPHY;  Surgeon: Jettie Booze, MD;  Location: [facility] [place] [facility];  Service: Cardiovascular;  Laterality: N/A;   LUMBAR LAMINECTOMY/DECOMPRESSION MICRODISCECTOMY Right 09/20/2019   Procedure: Right Lumbar four- Lumbar five Lumbar five Sacral one Laminectomy/Foraminotomy;  Surgeon: Kristeen Miss, MD;  Location: [place];  Service: Neurosurgery;  Laterality: Right;   ROBOTIC ADRENALECTOMY Right 10/17/2019   Procedure: XI ROBOTIC ADRENALECTOMY;  Surgeon: Ralene Ok, MD;  Location: [facility];  Service: General;  Laterality: Right;   TEE WITHOUT CARDIOVERSION N/A 01/12/2017   Procedure: TRANSESOPHAGEAL ECHOCARDIOGRAM (TEE);  Surgeon: Grace Isaac, MD;  Location: [place];  Service: Open Heart Surgery;  Laterality: N/A;   TONSILLECTOMY     VASECTOMY N/A    Phreesia 07/21/2019    Home Medications:  Allergies as of 07/29/2021       Reactions   Diovan [valsartan]    unknown        Medication List        Accurate as of July 29, 2021  2:03 PM. If you have any questions, ask your  nurse or doctor.          aspirin EC 81 MG tablet Take 81 mg by mouth daily.   atorvastatin 20 MG tablet Commonly known as: LIPITOR TAKE ONE TABLET BY MOUTH ONCE DAILY.   carvedilol 12.5 MG tablet Commonly known as: COREG Take 12.5 mg by mouth in the morning and at bedtime.   diphenhydramine-acetaminophen 25-500 MG Tabs tablet Commonly known as: TYLENOL PM Take 1 tablet by mouth at bedtime as needed.   eplerenone 25 MG tablet Commonly known as: INSPRA Take 50 mg by mouth 2 (two) times daily.    losartan 25 MG tablet Commonly known as: Cozaar Take 1 tablet (25 mg total) by mouth daily.   multivitamin with minerals Tabs tablet Take 1 tablet by mouth daily.   potassium chloride SA 20 MEQ tablet Commonly known as: KLOR-CON M Take 60 mEq by mouth daily.        Allergies:  Allergies  Allergen Reactions   Diovan [Valsartan]     unknown    Family History: Family History  Problem Relation Age of Onset   Hypertension Other    Cancer Brother        Colon   Cancer Brother        Melanoma    Social History:  reports that he has never smoked. He has never used smokeless tobacco. He reports current alcohol use. He reports that he does not use drugs.  ROS: All other review of systems were reviewed and are negative except what is noted above in HPI  Physical Exam: BP (!) 162/79   Pulse (!) 58   Constitutional:  Alert and oriented, No acute distress. HEENT: [place] AT, moist mucus membranes.  Trachea midline, no masses. Cardiovascular: No clubbing, cyanosis, or edema. Respiratory: Normal respiratory effort, no increased work of breathing. GI: Abdomen is soft, nontender, nondistended, no abdominal masses GU: No CVA tenderness.  Lymph: No cervical or inguinal lymphadenopathy. Skin: No rashes, bruises or suspicious lesions. Neurologic: Grossly intact, no focal deficits, moving all 4 extremities. Psychiatric: Normal mood and affect.  Laboratory Data: Lab Results  Component Value Date   WBC 17.8 (H) 06/23/2021   HGB 14.7 06/23/2021   HCT 42.7 06/23/2021   MCV 88.6 06/23/2021   PLT 243 06/23/2021    Lab Results  Component Value Date   CREATININE 0.88 06/23/2021    No results found for: "PSA"  No results found for: "TESTOSTERONE"  Lab Results  Component Value Date   HGBA1C 6.0 (H) 09/23/2020    Urinalysis    Component Value Date/Time   COLORURINE YELLOW 06/23/2021 1721   APPEARANCEUR CLEAR 06/23/2021 1721   LABSPEC 1.015 06/23/2021 1721   PHURINE 6.0  06/23/2021 1721   GLUCOSEU NEGATIVE 06/23/2021 1721   [place] 06/23/2021 1721   [place] 06/23/2021 1721   KETONESUR 5 (A) 06/23/2021 1721   PROTEINUR NEGATIVE 06/23/2021 1721   NITRITE NEGATIVE 06/23/2021 1721   LEUKOCYTESUR NEGATIVE 06/23/2021 1721    No results found for: "LABMICR", "WBCUA", "RBCUA", "LABEPIT", "MUCUS", "BACTERIA"  Pertinent Imaging:  No results found for this or any previous visit.  No results found for this or any previous visit.  No results found for this or any previous visit.  No results found for this or any previous visit.  No results found for this or any previous visit.  No results found for this or any previous visit.  No results found for this or any previous visit.  No results found  for this or any previous visit.   Assessment & Plan:    1. Urinary retention -RTC prn - BLADDER SCAN AMB NON-IMAGING - Urinalysis, Routine w reflex microscopic   No follow-ups on file.  Nicolette Bang, MD  [facility] [place]

## 2021-07-29 NOTE — Patient Instructions (Signed)

## 2021-07-30 DIAGNOSIS — L603 Nail dystrophy: Secondary | ICD-10-CM | POA: Diagnosis not present

## 2021-07-30 DIAGNOSIS — M79674 Pain in right toe(s): Secondary | ICD-10-CM | POA: Diagnosis not present

## 2021-08-10 ENCOUNTER — Ambulatory Visit (INDEPENDENT_AMBULATORY_CARE_PROVIDER_SITE_OTHER): Payer: PPO | Admitting: Family Medicine

## 2021-08-10 VITALS — BP 114/62 | HR 67 | Temp 98.4°F | Ht 67.0 in | Wt 175.0 lb

## 2021-08-10 DIAGNOSIS — R7303 Prediabetes: Secondary | ICD-10-CM

## 2021-08-10 DIAGNOSIS — I499 Cardiac arrhythmia, unspecified: Secondary | ICD-10-CM

## 2021-08-10 NOTE — Progress Notes (Signed)
Subjective:    Patient ID: Christopher Booth, male    DOB: [birth year], 81 y.o.   MRN: [id number]  HPI Patient is a very pleasant 81 year old Caucasian gentleman here today for ER follow up.  Was seen 5/30 in the ER for proctitis and acute urinary retention.  Was given cipro and foley catheter was placed.  Foley has since been removed at follow up with urology.   Patient states that the symptoms began suddenly.  He was unable to urinate or defecate.  He had been straining for more than a day trying to have a bowel movement prompting him to go to the emergency room.  At that time, CT scan confirmed proctitis and he was started on Cipro for presumed infectious origin.  He has done well since his hospitalization.  He has had no issues with defecation.  He is using MiraLAX to maintain regularity.  He has had no further issues with urinary retention.  While in the ambulance, the patient was presumed to be in atrial fibrillation.  Today he is in an irregular rhythm however based on exam it sounds more like sinus rhythm with PVCs.  I would like to get an EKG to follow-up.   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   Dysrhythmia    afib after surgery   Essential hypertension    Gynecomastia    Hyperaldosteronism    Hypertension    Phreesia 07/21/2019   Lumbar radiculopathy    Myocardial infarction ([place])    Phreesia 07/21/2019   Pneumonia    Postoperative atrial fibrillation (HCC)    Pre-diabetes    Spinal stenosis    Temporomandibular joint disease    Wears glasses    Past Surgical History:  Procedure Laterality Date   ADRENAL GLAND SURGERY  10/17/2019   CATARACT EXTRACTION W/PHACO Right 01/28/2015   Procedure: CATARACT EXTRACTION PHACO AND INTRAOCULAR LENS PLACEMENT RIGHT EYE;  Surgeon: Williams Che, MD;  Location: [facility];  Service:  Ophthalmology;  Laterality: Right;  CDE:27.23   CATARACT EXTRACTION W/PHACO Left 04/14/2015   Procedure: CATARACT EXTRACTION PHACO AND INTRAOCULAR LENS PLACEMENT LEFT EYE CDE=15.86;  Surgeon: Williams Che, MD;  Location: [facility];  Service: Ophthalmology;  Laterality: Left;   [place]   COLONOSCOPY     CORONARY ARTERY BYPASS GRAFT N/A 01/12/2017   Procedure: CORONARY ARTERY BYPASS GRAFTING (CABG);  Surgeon: Grace Isaac, MD;  Location: [place];  Service: Open Heart Surgery;  Laterality: N/A;  Times 3 using left internal mammary artery to LAD and endoscopically harvested right thigh saphenous vein to OM and PD   EYE SURGERY N/A    Phreesia 07/21/2019   KNEE ARTHROSCOPY Right 10/09/2013   Procedure: RIGHT ARTHROSCOPY KNEE Partial medial and lateral meniscal tear and chondroplasty;  Surgeon: Hessie Dibble, MD;  Location: [place];  Service: Orthopedics;  Laterality: Right;  Partial medial and lateral meniscal tear and chondroplasty    LEFT HEART CATH AND CORONARY ANGIOGRAPHY N/A 01/11/2017   Procedure: LEFT HEART CATH AND CORONARY ANGIOGRAPHY;  Surgeon: Jettie Booze, MD;  Location: [place] [facility];  Service: Cardiovascular;  Laterality: N/A;   LUMBAR LAMINECTOMY/DECOMPRESSION MICRODISCECTOMY Right 09/20/2019   Procedure: Right Lumbar four- Lumbar five Lumbar five Sacral one Laminectomy/Foraminotomy;  Surgeon: Kristeen Miss, MD;  Location: [place];  Service: Neurosurgery;  Laterality: Right;   ROBOTIC ADRENALECTOMY Right 10/17/2019   Procedure: XI ROBOTIC ADRENALECTOMY;  Surgeon: Ralene Ok, MD;  Location: [place];  Service: General;  Laterality: Right;   TEE WITHOUT CARDIOVERSION N/A 01/12/2017   Procedure: TRANSESOPHAGEAL ECHOCARDIOGRAM (TEE);  Surgeon: Grace Isaac, MD;  Location: [place];  Service: Open Heart Surgery;  Laterality: N/A;   TONSILLECTOMY     VASECTOMY N/A    Phreesia 07/21/2019   Current Outpatient  Medications on File Prior to Visit  Medication Sig Dispense Refill   aspirin 81 MG EC tablet Take 81 mg by mouth daily.     atorvastatin (LIPITOR) 20 MG tablet TAKE ONE TABLET BY MOUTH ONCE DAILY. 90 tablet 1   carvedilol (COREG) 12.5 MG tablet Take 12.5 mg by mouth in the morning and at bedtime.     diphenhydramine-acetaminophen (TYLENOL PM) 25-500 MG TABS tablet Take 1 tablet by mouth at bedtime as needed.     eplerenone (INSPRA) 25 MG tablet Take 50 mg by mouth 2 (two) times daily.     losartan (COZAAR) 25 MG tablet Take 1 tablet (25 mg total) by mouth daily. 90 tablet 3   Multiple Vitamin (MULTIVITAMIN WITH MINERALS) TABS tablet Take 1 tablet by mouth daily.     potassium chloride SA (KLOR-CON) 20 MEQ tablet Take 60 mEq by mouth daily.     [DISCONTINUED] telmisartan-hydrochlorothiazide (MICARDIS HCT) 80-12.5 MG per tablet Take 1 tablet by mouth daily.     No current facility-administered medications on file prior to visit.   Allergies  Allergen Reactions   Diovan [Valsartan]     unknown   Social History   Socioeconomic History   Marital status: Married    Spouse name: Not on file   Number of children: Not on file   Years of education: Not on file   Highest education level: Not on file  Occupational History   Occupation: Nurse Engineer, petroleum: RETIRED    Comment: Retired  Tobacco Use   Smoking status: Never   Smokeless tobacco: Never  Vaping Use   Vaping Use: Never used  Substance and Sexual Activity   Alcohol use: Yes    Comment: Occasional   Drug use: No   Sexual activity: Not Currently  Other Topics Concern   Not on file  Social History Narrative   Not on file   Social Determinants of Health   Financial Resource Strain: Low Risk  (10/30/2020)   Overall Financial Resource Strain (CARDIA)    Difficulty of Paying Living Expenses: Not hard at all  Food Insecurity: No Food Insecurity (10/30/2020)   Hunger Vital Sign    Worried About Running Out of Food in  the Last Year: Never true    [place] in the Last Year: Never true  Transportation Needs: No Transportation Needs (10/30/2020)   PRAPARE - Hydrologist (Medical): No    Lack of Transportation (Non-Medical): No  Physical Activity: Insufficiently Active (10/30/2020)   Exercise Vital Sign    Days of Exercise per Week: 3 days    Minutes of Exercise per Session: 20 min  Stress: No Stress Concern Present (10/30/2020)   [place]    Feeling of Stress : Not at all  Social Connections: [place] (10/30/2020)   Social Connection and Isolation Panel [NHANES]    Frequency of Communication  with Friends and Family: More than three times a week    Frequency of Social Gatherings with Friends and Family: More than three times a week    Attends Religious Services: 1 to 4 times per year    Active Member of [employer] or Organizations: No    Attends Music therapist: 1 to 4 times per year    Marital Status: Married  Human resources officer Violence: Not At Risk (10/30/2020)   Humiliation, Afraid, Rape, and Kick questionnaire    Fear of Current or Ex-Partner: No    Emotionally Abused: No    Physically Abused: No    Sexually Abused: No   Family History  Problem Relation Age of Onset   Hypertension Other    Cancer Brother        Colon   Cancer Brother        Melanoma       Review of Systems  All other systems reviewed and are negative.      Objective:   Physical Exam Vitals reviewed.  Constitutional:      General: He is not in acute distress.    Appearance: Normal appearance. He is normal weight. He is not ill-appearing or toxic-appearing.  HENT:     Nose: No congestion or rhinorrhea.  Cardiovascular:     Rate and Rhythm: Normal rate. Rhythm irregular.     Pulses: Normal pulses.     Heart sounds: Normal heart sounds. No murmur heard.    No friction rub. No gallop.  Pulmonary:      Effort: Pulmonary effort is normal. No respiratory distress.     Breath sounds: Normal breath sounds. No wheezing or rales.  Abdominal:     General: There is no distension.     Tenderness: There is no abdominal tenderness.  Musculoskeletal:     Cervical back: Decreased range of motion.  Neurological:     General: No focal deficit present.     Mental Status: He is alert and oriented to person, place, and time. Mental status is at baseline.     Cranial Nerves: No cranial nerve deficit.     Sensory: No sensory deficit.     Motor: Weakness present.     Coordination: Coordination normal.     Gait: Gait abnormal.  Psychiatric:        Mood and Affect: Mood normal.        Behavior: Behavior normal.        Thought Content: Thought content normal.        Judgment: Judgment normal.           Assessment & Plan:  Prediabetes - Plan: CBC with Differential/Platelet, BASIC METABOLIC PANEL WITH GFR, Hemoglobin A1c  Regularly irregular pulse rhythym - Plan: EKG 12-Lead Patient has had 1 isolated episode of proctitis and urinary retention secondary to that.  This could have been triggered by inflammation due to severe constipation.  Less likely would be infectious.  Less likely than that would be Crohn's disease or inflammatory bowel disease.  We discussed a referral to GI for sigmoidoscopy however patient elects to monitor clinically.  If not recurrent no further work-up is necessary.  If this recurs, would recommend sigmoidoscopy.  Check CBC to ensure resolution of leukocytosis.  While here today I will check a BMP and an A1c.  He is in a regularly irregular rhythm which I believe are PVCs however I will check a EKG today to evaluate further.  EG confirms normal sinus rhythm  with frequent PACs but no evidence of ischemia or infarct

## 2021-08-11 LAB — BASIC METABOLIC PANEL WITH GFR
BUN: 15 mg/dL (ref 7–25)
CO2: 28 mmol/L (ref 20–32)
Calcium: 9.8 mg/dL (ref 8.6–10.3)
Chloride: 102 mmol/L (ref 98–110)
Creat: 0.9 mg/dL (ref 0.70–1.22)
Glucose, Bld: 93 mg/dL (ref 65–99)
Potassium: 4.7 mmol/L (ref 3.5–5.3)
Sodium: 138 mmol/L (ref 135–146)
eGFR: 86 mL/min/{1.73_m2} (ref >=60)

## 2021-08-11 LAB — CBC WITH DIFFERENTIAL/PLATELET
Absolute Monocytes: 828 cells/uL (ref 200–950)
Basophils Absolute: 68 cells/uL (ref 0–200)
Basophils Relative: 0.9 %
Eosinophils Absolute: 707 cells/uL — ABNORMAL HIGH (ref 15–500)
Eosinophils Relative: 9.3 %
HCT: 42.5 % (ref 38.5–50.0)
Hemoglobin: 14.3 g/dL (ref 13.2–17.1)
Lymphs Abs: 2082 cells/uL (ref 850–3900)
MCH: 30.4 pg (ref 27.0–33.0)
MCHC: 33.6 g/dL (ref 32.0–36.0)
MCV: 90.2 fL (ref 80.0–100.0)
MPV: 9.6 fL (ref 7.5–12.5)
Monocytes Relative: 10.9 %
Neutro Abs: 3914 cells/uL (ref 1500–7800)
Neutrophils Relative %: 51.5 %
Platelets: 270 10*3/uL (ref 140–400)
RBC: 4.71 10*6/uL (ref 4.20–5.80)
RDW: 12.2 % (ref 11.0–15.0)
Total Lymphocyte: 27.4 %
WBC: 7.6 10*3/uL (ref 3.8–10.8)

## 2021-08-11 LAB — HEMOGLOBIN A1C
Hgb A1c MFr Bld: 5.9 % of total Hgb — ABNORMAL HIGH (ref <5.7)
Mean Plasma Glucose: 123 mg/dL
eAG (mmol/L): 6.8 mmol/L

## 2021-08-17 ENCOUNTER — Other Ambulatory Visit: Payer: Self-pay | Admitting: Family Medicine

## 2021-08-17 DIAGNOSIS — E782 Mixed hyperlipidemia: Secondary | ICD-10-CM

## 2021-09-11 DIAGNOSIS — I5032 Chronic diastolic (congestive) heart failure: Secondary | ICD-10-CM | POA: Diagnosis not present

## 2021-09-11 DIAGNOSIS — E2609 Other primary hyperaldosteronism: Secondary | ICD-10-CM | POA: Diagnosis not present

## 2021-09-11 DIAGNOSIS — R809 Proteinuria, unspecified: Secondary | ICD-10-CM | POA: Diagnosis not present

## 2021-09-11 DIAGNOSIS — I129 Hypertensive chronic kidney disease with stage 1 through stage 4 chronic kidney disease, or unspecified chronic kidney disease: Secondary | ICD-10-CM | POA: Diagnosis not present

## 2021-09-11 DIAGNOSIS — E896 Postprocedural adrenocortical (-medullary) hypofunction: Secondary | ICD-10-CM | POA: Diagnosis not present

## 2021-09-11 DIAGNOSIS — N182 Chronic kidney disease, stage 2 (mild): Secondary | ICD-10-CM | POA: Diagnosis not present

## 2021-09-15 ENCOUNTER — Ambulatory Visit: Payer: PPO | Admitting: Cardiology

## 2021-09-15 ENCOUNTER — Encounter: Payer: Self-pay | Admitting: Cardiology

## 2021-09-15 VITALS — BP 122/70 | HR 72 | Ht 69.0 in | Wt 163.6 lb

## 2021-09-15 DIAGNOSIS — R002 Palpitations: Secondary | ICD-10-CM | POA: Diagnosis not present

## 2021-09-15 DIAGNOSIS — I25119 Atherosclerotic heart disease of native coronary artery with unspecified angina pectoris: Secondary | ICD-10-CM

## 2021-09-15 NOTE — Progress Notes (Signed)
Cardiology Office Note  Date: 09/15/2021   ID: [name], [name] 05/02/1940, MRN [id number]  PCP:  Susy Frizzle, MD  Cardiologist:  Rozann Lesches, MD Electrophysiologist:  None   Chief Complaint  Patient presents with   Cardiac follow-up    History of Present Illness: Christopher Booth is an 81 y.o. male last seen in December 2021.  He is here for a follow-up visit.  Reports no angina symptoms on current therapy and with present level of activity.  He describes an intermittent sense of palpitations consistent with PVCs based on description, also had an ECG done in July showing PVCs.  He has a prior history of postoperative atrial fibrillation but no documented recurrences.  I reviewed his medications which are noted below.  His blood pressure is well controlled, he continues to follow with Dr. Theador Hawthorne and has a prior history of right adrenalectomy.  His most recent lab work shows normal renal function and potassium.  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   Essential hypertension    Gynecomastia    Hyperaldosteronism    Hypertension    Lumbar radiculopathy    Myocardial infarction [facility])    Pneumonia    Postoperative atrial fibrillation ([place])    Pre-diabetes    Spinal stenosis    Temporomandibular joint disease    Wears glasses     Past Surgical History:  Procedure Laterality Date   ADRENAL GLAND SURGERY  10/17/2019   CATARACT EXTRACTION W/PHACO Right 01/28/2015   Procedure: CATARACT EXTRACTION PHACO AND INTRAOCULAR LENS PLACEMENT RIGHT EYE;  Surgeon: Williams Che, MD;  [facility]: [facility];  Service: Ophthalmology;  Laterality: Right;  CDE:27.23   CATARACT EXTRACTION W/PHACO Left 04/14/2015   Procedure: CATARACT EXTRACTION PHACO AND INTRAOCULAR LENS PLACEMENT LEFT EYE CDE=15.86;  Surgeon: Williams Che, MD;   Location[facility];  Service: Ophthalmology;  Laterality: Left;   [place]   COLONOSCOPY     CORONARY ARTERY BYPASS GRAFT N/A 01/12/2017   Procedure: CORONARY ARTERY BYPASS GRAFTING (CABG);  Surgeon: Grace Isaac, MD;  Location: [facility];  Service: Open Heart Surgery;  Laterality: N/A;  Times 3 using left internal mammary artery to LAD and endoscopically harvested right thigh saphenous vein to OM and PD   EYE SURGERY N/A    Phreesia 07/21/2019   KNEE ARTHROSCOPY Right 10/09/2013   Procedure: RIGHT ARTHROSCOPY KNEE Partial medial and lateral meniscal tear and chondroplasty;  Surgeon: Hessie Dibble, MD;  Location: [place];  Service: Orthopedics;  Laterality: Right;  Partial medial and lateral meniscal tear and chondroplasty    LEFT HEART CATH AND CORONARY ANGIOGRAPHY N/A 01/11/2017   Procedure: LEFT HEART CATH AND CORONARY ANGIOGRAPHY;  Surgeon: Jettie Booze, MD;  Location: [place] [facility];  Service: Cardiovascular;  Laterality: N/A;   LUMBAR LAMINECTOMY/DECOMPRESSION MICRODISCECTOMY Right 09/20/2019   Procedure: Right Lumbar four- Lumbar five Lumbar five Sacral one Laminectomy/Foraminotomy;  Surgeon: Kristeen Miss, MD;  Location: [place];  Service: Neurosurgery;  Laterality: Right;   ROBOTIC ADRENALECTOMY Right 10/17/2019   Procedure: XI ROBOTIC ADRENALECTOMY;  Surgeon: Ralene Ok, MD;  Location: [facility];  Service: General;  Laterality: Right;   TEE WITHOUT CARDIOVERSION N/A 01/12/2017   Procedure: TRANSESOPHAGEAL ECHOCARDIOGRAM (TEE);  Surgeon: Servando Snare,  Lilia Argue, MD;  Location: [place];  Service: Open Heart Surgery;  Laterality: N/A;   TONSILLECTOMY     VASECTOMY N/A    Phreesia 07/21/2019    Current Outpatient Medications  Medication Sig Dispense Refill   aspirin 81 MG EC tablet Take 81 mg by mouth daily.     atorvastatin (LIPITOR) 20 MG tablet TAKE ONE TABLET BY MOUTH ONCE DAILY. 90 tablet 3   carvedilol (COREG) 12.5 MG  tablet Take 12.5 mg by mouth in the morning and at bedtime.     diphenhydramine-acetaminophen (TYLENOL PM) 25-500 MG TABS tablet Take 1 tablet by mouth at bedtime as needed.     eplerenone (INSPRA) 25 MG tablet Take 50 mg by mouth 2 (two) times daily.     losartan (COZAAR) 25 MG tablet Take 1 tablet (25 mg total) by mouth daily. 90 tablet 3   Multiple Vitamin (MULTIVITAMIN WITH MINERALS) TABS tablet Take 1 tablet by mouth daily.     potassium chloride SA (KLOR-CON) 20 MEQ tablet Take 40 mEq by mouth daily.     No current facility-administered medications for this visit.   Allergies:  Diovan [valsartan]   ROS: No orthopnea or PND.  No syncope.  Physical Exam: VS:  BP 122/70   Pulse 72   Ht '5\' 9"'$  (1.753 m)   Wt 163 lb 9.6 oz (74.2 kg)   SpO2 97%   BMI 24.16 kg/m , BMI Body mass index is 24.16 kg/m.  Wt Readings from Last 3 Encounters:  09/15/21 163 lb 9.6 oz (74.2 kg)  08/10/21 175 lb (79.4 kg)  07/01/21 170 lb (77.1 kg)    General: Patient appears comfortable at rest. HEENT: Conjunctiva and lids normal. Neck: Supple, no elevated JVP or carotid bruits. Lungs: Clear to auscultation, nonlabored breathing at rest. Cardiac: Regular rate and rhythm with single PVC, no S3 or significant systolic murmur. Extremities: No pitting edema, brace right lower leg.  ECG:  An ECG dated 08/10/2021 was personally reviewed today and demonstrated:  Sinus rhythm with left anterior fascicular block, nonspecific T wave changes, PVCs.  Recent Labwork: 09/23/2020: ALT 31; AST 23 08/10/2021: BUN 15; Creat 0.90; Hemoglobin 14.3; Platelets 270; Potassium 4.7; Sodium 138     Component Value Date/Time   CHOL 154 05/30/2019 0843   CHOL 153 11/16/2016 0739   TRIG 67 05/30/2019 0843   TRIG 126 11/16/2016 0739   HDL 52 05/30/2019 0843   HDL 47 11/16/2016 0739   CHOLHDL 3.0 05/30/2019 0843   VLDL 17 01/12/2017 0148   LDLCALC 87 05/30/2019 0843    Other Studies Reviewed Today:  Echocardiogram  02/05/2020:  1. Left ventricular ejection fraction, by estimation, is 55 to 60%. The  left ventricle has normal function. The left ventricle has no regional  wall motion abnormalities. There is mild left ventricular hypertrophy.  Left ventricular diastolic parameters  are indeterminate.   2. Right ventricular systolic function is normal. The right ventricular  size is normal. There is normal pulmonary artery systolic pressure.   3. Left atrial size was severely dilated.   4. Right atrial size was mildly dilated.   5. The mitral valve is normal in structure. Mild mitral valve  regurgitation. No evidence of mitral stenosis.   6. The aortic valve is tricuspid. Aortic valve regurgitation is mild. No  aortic stenosis is present.   7. The inferior vena cava is normal in size with greater than 50%  respiratory variability, suggesting right atrial pressure of 3 mmHg.  Assessment and Plan:  1.  Multivessel CAD status post CABG in 2018.  He does not describe any active angina and continues on aspirin, Coreg, Cozaar, and Lipitor.  2.  Hypertension with history of hyperaldosteronism status post right adrenalectomy in September 2021.  He is doing well and continues to follow with Dr. Theador Hawthorne, remains on eplerenone and potassium supplementation.  Blood pressure is normal today.  3.  Intermittent palpitations consistent with PVCs.  If symptoms escalate would consider Zio patch mainly to exclude paroxysmal atrial fibrillation.  Medication Adjustments/Labs and Tests Ordered: Current medicines are reviewed at length with the patient today.  Concerns regarding medicines are outlined above.   Tests Ordered: No orders of the defined types were placed in this encounter.   Medication Changes: No orders of the defined types were placed in this encounter.   Disposition:  Follow up  6 months.  Signed, Satira Sark, MD, [facility] 09/15/2021 4:17 PM    [facility], [place], [place] [address] Phone: [phone]; Fax: [phone]

## 2021-09-15 NOTE — Patient Instructions (Signed)

## 2021-09-17 DIAGNOSIS — N182 Chronic kidney disease, stage 2 (mild): Secondary | ICD-10-CM | POA: Diagnosis not present

## 2021-09-17 DIAGNOSIS — R809 Proteinuria, unspecified: Secondary | ICD-10-CM | POA: Diagnosis not present

## 2021-09-17 DIAGNOSIS — E896 Postprocedural adrenocortical (-medullary) hypofunction: Secondary | ICD-10-CM | POA: Diagnosis not present

## 2021-09-17 DIAGNOSIS — E876 Hypokalemia: Secondary | ICD-10-CM | POA: Diagnosis not present

## 2021-09-17 DIAGNOSIS — I5032 Chronic diastolic (congestive) heart failure: Secondary | ICD-10-CM | POA: Diagnosis not present

## 2021-09-17 DIAGNOSIS — E2609 Other primary hyperaldosteronism: Secondary | ICD-10-CM | POA: Diagnosis not present

## 2021-09-17 DIAGNOSIS — I129 Hypertensive chronic kidney disease with stage 1 through stage 4 chronic kidney disease, or unspecified chronic kidney disease: Secondary | ICD-10-CM | POA: Diagnosis not present

## 2021-10-06 DIAGNOSIS — L570 Actinic keratosis: Secondary | ICD-10-CM | POA: Diagnosis not present

## 2021-10-06 DIAGNOSIS — D239 Other benign neoplasm of skin, unspecified: Secondary | ICD-10-CM | POA: Diagnosis not present

## 2021-10-06 DIAGNOSIS — Z85828 Personal history of other malignant neoplasm of skin: Secondary | ICD-10-CM | POA: Diagnosis not present

## 2021-10-13 DIAGNOSIS — M79674 Pain in right toe(s): Secondary | ICD-10-CM | POA: Diagnosis not present

## 2021-10-13 DIAGNOSIS — L603 Nail dystrophy: Secondary | ICD-10-CM | POA: Diagnosis not present

## 2021-10-26 ENCOUNTER — Ambulatory Visit (INDEPENDENT_AMBULATORY_CARE_PROVIDER_SITE_OTHER): Payer: PPO

## 2021-10-26 DIAGNOSIS — I10 Essential (primary) hypertension: Secondary | ICD-10-CM

## 2021-10-26 DIAGNOSIS — E782 Mixed hyperlipidemia: Secondary | ICD-10-CM

## 2021-10-26 DIAGNOSIS — Z951 Presence of aortocoronary bypass graft: Secondary | ICD-10-CM

## 2021-10-26 DIAGNOSIS — Z23 Encounter for immunization: Secondary | ICD-10-CM | POA: Diagnosis not present

## 2021-11-12 ENCOUNTER — Encounter: Payer: Self-pay | Admitting: Family Medicine

## 2021-11-12 ENCOUNTER — Ambulatory Visit (INDEPENDENT_AMBULATORY_CARE_PROVIDER_SITE_OTHER): Payer: PPO | Admitting: Family Medicine

## 2021-11-12 VITALS — BP 130/72 | HR 58 | Ht 69.0 in | Wt 175.6 lb

## 2021-11-12 DIAGNOSIS — E896 Postprocedural adrenocortical (-medullary) hypofunction: Secondary | ICD-10-CM

## 2021-11-12 DIAGNOSIS — R7303 Prediabetes: Secondary | ICD-10-CM

## 2021-11-12 DIAGNOSIS — I25119 Atherosclerotic heart disease of native coronary artery with unspecified angina pectoris: Secondary | ICD-10-CM | POA: Diagnosis not present

## 2021-11-12 DIAGNOSIS — E782 Mixed hyperlipidemia: Secondary | ICD-10-CM | POA: Diagnosis not present

## 2021-11-12 DIAGNOSIS — Z951 Presence of aortocoronary bypass graft: Secondary | ICD-10-CM

## 2021-11-12 DIAGNOSIS — Z0000 Encounter for general adult medical examination without abnormal findings: Secondary | ICD-10-CM

## 2021-11-12 DIAGNOSIS — Z23 Encounter for immunization: Secondary | ICD-10-CM

## 2021-11-12 DIAGNOSIS — I10 Essential (primary) hypertension: Secondary | ICD-10-CM

## 2021-11-12 NOTE — Progress Notes (Signed)
Subjective:    Patient ID: Christopher Booth, male    DOB: Mar 01, 1940, 81 y.o.   MRN: [id number]  HPI Patient is a very pleasant 81 year old Caucasian gentleman here today for a CPE.  He has a history of coronary artery disease.  He underwent CABG in 2018.  He sees [name] for cardiology.  He also has a longstanding history of hypokalemia.  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  This was not routinely monitored and began to grow.  Therefore in 2021 he underwent surgery to remove the adrenal adenoma.  However the patient still has persistent hypertension as well as hypokalemia and has to take inspra 50 mg twice daily in addition to 60 mill equivalents a day of potassium.  As a result, his nephrologist recommended a repeat MRI of his adrenal glands and he was found to have a 1 cm mass on his left adrenal gland.  Per the patient's report nephrology is monitoring this yearly for any growth.  Patient had a CT scan of the abdomen and pelvis for another reason in May of this year.  There was no change in the 1 cm left adrenal adenoma.  He also has a history of prediabetes.  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  He underwent an emergent lumbar laminectomy in 2021 due to the foot drop however he has persistent weakness in the right leg even after causing a foot drop.  He has had his flu shot.  He appears to be due for Prevnar 20, Shingrix, and a COVID booster. Immunization History  Administered Date(s) Administered   Fluad Quad(high Dose 80+) 11/12/2020   Influenza, High Dose Seasonal PF 10/26/2016, 11/17/2017, 10/24/2018, 10/26/2021   Influenza-Unspecified 10/25/2012, 12/05/2019   PFIZER(Purple Top)SARS-COV-2 Vaccination 04/26/2019, 05/26/2019   Pneumococcal Polysaccharide-23 09/23/2020   Zoster, Live 09/27/2015   No visits with results within 3 Month(s) from this visit.  Latest known visit with results is:  Office Visit on 08/10/2021   Component Date Value Ref Range Status   WBC 08/10/2021 7.6  3.8 - 10.8 Thousand/uL Final   RBC 08/10/2021 4.71  4.20 - 5.80 Million/uL Final   Hemoglobin 08/10/2021 14.3  13.2 - 17.1 g/dL Final   HCT 08/10/2021 42.5  38.5 - 50.0 % Final   MCV 08/10/2021 90.2  80.0 - 100.0 fL Final   MCH 08/10/2021 30.4  27.0 - 33.0 pg Final   MCHC 08/10/2021 33.6  32.0 - 36.0 g/dL Final   RDW 08/10/2021 12.2  11.0 - 15.0 % Final   Platelets 08/10/2021 270  140 - 400 Thousand/uL Final   MPV 08/10/2021 9.6  7.5 - 12.5 fL Final   Neutro Abs 08/10/2021 3,914  1,500 - 7,800 cells/uL Final   Lymphs Abs 08/10/2021 2,082  850 - 3,900 cells/uL Final   Absolute Monocytes 08/10/2021 828  200 - 950 cells/uL Final   Eosinophils Absolute 08/10/2021 707 (H)  15 - 500 cells/uL Final   Basophils Absolute 08/10/2021 68  0 - 200 cells/uL Final   Neutrophils Relative % 08/10/2021 51.5  % Final   Total Lymphocyte 08/10/2021 27.4  % Final   Monocytes Relative 08/10/2021 10.9  % Final   Eosinophils Relative 08/10/2021 9.3  % Final   Basophils Relative 08/10/2021 0.9  % Final   Glucose, Bld 08/10/2021 93  65 - 99 mg/dL Final   Comment: .  Fasting reference interval .    BUN 08/10/2021 15  7 - 25 mg/dL Final   Creat 08/10/2021 0.90  0.70 - 1.22 mg/dL Final   eGFR 08/10/2021 86  > OR = 60 mL/min/1.64m Final   Comment: The eGFR is based on the CKD-EPI 2021 equation. To calculate  the new eGFR from a previous Creatinine or Cystatin C result, go to https://www.kidney.org/professionals/ kdoqi/gfr%5Fcalculator    BUN/Creatinine Ratio 040/98/1191NOT APPLICABLE  6 - 22 (calc) Final   Sodium 08/10/2021 138  135 - 146 mmol/L Final   Potassium 08/10/2021 4.7  3.5 - 5.3 mmol/L Final   Chloride 08/10/2021 102  98 - 110 mmol/L Final   CO2 08/10/2021 28  20 - 32 mmol/L Final   Calcium 08/10/2021 9.8  8.6 - 10.3 mg/dL Final   Hgb A1c MFr Bld 08/10/2021 5.9 (H)  <5.7 % of total Hgb Final   Comment: For someone without  known diabetes, a hemoglobin  A1c value between 5.7% and 6.4% is consistent with prediabetes and should be confirmed with a  follow-up test. . For someone with known diabetes, a value <7% indicates that their diabetes is well controlled. A1c targets should be individualized based on duration of diabetes, age, comorbid conditions, and other considerations. . This assay result is consistent with an increased risk of diabetes. . Currently, no consensus exists regarding use of hemoglobin A1c for diagnosis of diabetes for children. .    Mean Plasma Glucose 08/10/2021 123  mg/dL Final   eAG (mmol/L) 08/10/2021 6.8  mmol/L Final    Past Medical History:  Diagnosis Date   Adrenal mass (HCC)    Right, benign Conn's Syndrome   Arthritis    CAD (coronary artery disease)    Multivessel/left main status post CABG 12/2016   Essential hypertension    Gynecomastia    Hyperaldosteronism    Hypertension    Lumbar radiculopathy    [facility] ([facility]    Pneumonia    [place]operative atrial fibrillation ([place]    Pre-diabetes    Spinal stenosis    Temporomandibular joint disease    Wears glasses    Past Surgical History:  Procedure Laterality Date   ADRENAL GLAND SURGERY  10/17/2019   CATARACT EXTRACTION W/PHACO Right 01/28/2015   Procedure: CATARACT EXTRACTION PHACO AND INTRAOCULAR LENS PLACEMENT RIGHT EYE;  Surgeon: CWilliams Che MD;  Location: [facility];  Service: Ophthalmology;  Laterality: Right;  CDE:27.23   CATARACT EXTRACTION W/PHACO Left 04/14/2015   Procedure: CATARACT EXTRACTION PHACO AND INTRAOCULAR LENS PLACEMENT LEFT EYE CDE=15.86;  Surgeon: CWilliams Che MD;  Location: [facility];  Service: Ophthalmology;  Laterality: Left;   [place]  COLONOSCOPY     CORONARY ARTERY BYPASS GRAFT N/A 01/12/2017   Procedure: CORONARY ARTERY BYPASS GRAFTING (CABG);  Surgeon: GGrace Isaac MD;  [place]  Service: Open Heart Surgery;   Laterality: N/A;  Times 3 using left internal mammary artery to LAD and endoscopically harvested right thigh saphenous vein to OM and PD   EYE SURGERY N/A    Phreesia 07/21/2019   KNEE ARTHROSCOPY Right 10/09/2013   Procedure: RIGHT ARTHROSCOPY KNEE Partial medial and lateral meniscal tear and chondroplasty;  Surgeon: PHessie Dibble MD;  [place]  Service: Orthopedics;  Laterality: Right;  Partial medial and lateral meniscal tear and chondroplasty    LEFT HEART CATH AND CORONARY ANGIOGRAPHY N/A 01/11/2017   Procedure: LEFT HEART CATH  AND CORONARY ANGIOGRAPHY;  Surgeon: Jettie Booze, MD;  Location: [place] [facility];  Service: Cardiovascular;  Laterality: N/A;   LUMBAR LAMINECTOMY/DECOMPRESSION MICRODISCECTOMY Right 09/20/2019   Procedure: Right Lumbar four- Lumbar five Lumbar five Sacral one Laminectomy/Foraminotomy;  Surgeon: Kristeen Miss, MD;  Location: [place];  Service: Neurosurgery;  Laterality: Right;   ROBOTIC ADRENALECTOMY Right 10/17/2019   Procedure: XI ROBOTIC ADRENALECTOMY;  Surgeon: Ralene Ok, MD;  Location: [place];  Service: General;  Laterality: Right;   TEE WITHOUT CARDIOVERSION N/A 01/12/2017   Procedure: TRANSESOPHAGEAL ECHOCARDIOGRAM (TEE);  Surgeon: Grace Isaac, MD;  Location: [place];  Service: Open Heart Surgery;  Laterality: N/A;   TONSILLECTOMY     VASECTOMY N/A    Phreesia 07/21/2019     Allergies  Allergen Reactions   Diovan [Valsartan]     unknown   Social History   Socioeconomic History   Marital status: Married    Spouse name: Not on file   Number of children: Not on file   Years of education: Not on file   Highest education level: Not on file  Occupational History   Occupation: Nurse Engineer, petroleum: RETIRED    Comment: Retired  Tobacco Use   Smoking status: Never    Passive exposure: Never   Smokeless tobacco: Never  Vaping Use   Vaping Use: Never used  Substance and Sexual Activity    Alcohol use: Yes    Comment: Occasional   Drug use: No   Sexual activity: Not Currently  Other Topics Concern   Not on file  Social History Narrative   Not on file   Social Determinants of Health   Financial Resource Strain: [place]  (10/30/2020)   Overall Financial Resource Strain (CARDIA)    Difficulty of Paying Living Expenses: Not hard at all  Food Insecurity: No Food Insecurity (10/30/2020)   Hunger Vital Sign    Worried About Running Out of Food in the Last Year: Never true    [place] in the Last Year: Never true  Transportation Needs: No Transportation Needs (10/30/2020)   PRAPARE - Hydrologist (Medical): No    Lack of Transportation (Non-Medical): No  Physical Activity: Insufficiently Active (10/30/2020)   Exercise Vital Sign    Days of Exercise per Week: 3 days    Minutes of Exercise per Session: 20 min  Stress: No Stress Concern Present (10/30/2020)   [place]    Feeling of Stress : Not at all  Social Connections: [place] (10/30/2020)   Social Connection and Isolation Panel [NHANES]    Frequency of Communication with Friends and Family: More than three times a week    Frequency of Social Gatherings with Friends and Family: More than three times a week    Attends Religious Services: 1 to 4 times per year    Active Member of [employer] or Organizations: No    Attends Archivist Meetings: 1 to 4 times per year    Marital Status: Married  Human resources officer Violence: Not At Risk (10/30/2020)   Humiliation, Afraid, Rape, and Kick questionnaire    Fear of Current or Ex-Partner: No    Emotionally Abused: No    Physically Abused: No    Sexually Abused: No   Family History  Problem Relation Age of Onset   Hypertension Other    Cancer Brother  Colon   Cancer Brother        Melanoma       Review of Systems  All other systems reviewed and are  negative.      Objective:   Physical Exam Vitals reviewed.  Constitutional:      General: He is not in acute distress.    Appearance: Normal appearance. He is normal weight. He is not ill-appearing or toxic-appearing.  HENT:     Right Ear: Tympanic membrane and ear canal normal.     Left Ear: Tympanic membrane and ear canal normal.     Nose: Nose normal. No congestion or rhinorrhea.  Eyes:     General: No scleral icterus.    Extraocular Movements: Extraocular movements intact.     Conjunctiva/sclera: Conjunctivae normal.     Pupils: Pupils are equal, round, and reactive to light.  Neck:     Vascular: No carotid bruit.  Cardiovascular:     Rate and Rhythm: Normal rate and regular rhythm.     Pulses: Normal pulses.     Heart sounds: Normal heart sounds. No murmur heard.    No friction rub. No gallop.  Pulmonary:     Effort: Pulmonary effort is normal. No respiratory distress.     Breath sounds: Normal breath sounds. No wheezing or rales.  Abdominal:     General: Bowel sounds are normal. There is no distension.     Palpations: Abdomen is soft.     Tenderness: There is no abdominal tenderness. There is no guarding.  Musculoskeletal:     Cervical back: Neck supple. Decreased range of motion.  Lymphadenopathy:     Cervical: No cervical adenopathy.  Neurological:     General: No focal deficit present.     Mental Status: He is alert and oriented to person, place, and time. Mental status is at baseline.     Cranial Nerves: No cranial nerve deficit.     Sensory: No sensory deficit.     Motor: Weakness present.     Coordination: Coordination normal.     Gait: Gait abnormal.  Psychiatric:        Mood and Affect: Mood normal.        Behavior: Behavior normal.        Thought Content: Thought content normal.        Judgment: Judgment normal.           Assessment & Plan:  Encounter for Medicare annual wellness exam  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[place])  Coronary artery disease  involving native coronary artery of native heart with angina pectoris ([place]) - Plan: BASIC METABOLIC PANEL WITH GFR, Lipid panel  Prediabetes  S/P CABG x 2  Mixed hyperlipidemia  Essential hypertension, benign Patient's blood pressure today is excellent at 130/72.  He had lab work done in July.  At that point his renal function was excellent.  His potassium was normal despite being on Inspra.  His A1c was outstanding at 5.9.  I will check a BMP today to monitor his potassium.  His nephrologist request that we do this every 4 months.  He is a month early but this will save him a trip back.  I see no reason to repeat A1c so early given how excellent was.  He appears to be diet controlled at the present time.  I would like his LDL cholesterol to be less than 70 given his history of coronary artery disease.  Diabetic foot exam was performed today and is significant only  for his chronic right foot drop.  Due to his age, he does not require colonoscopy or prostate cancer screening.  Patient received Prevnar 20 today.  Strongly recommended Shingrix.  Also strongly recommended a COVID booster.  We discussed the pros and cons of RSV and he declines.  Denies any falls, memory loss, or depression.  We will check a protein creatinine ratio.  Goal ratio is less than 30.

## 2021-11-12 NOTE — Addendum Note (Signed)
Addended by: Randal Buba K on: 11/12/2021 11:31 AM   Modules accepted: Orders

## 2021-11-13 LAB — BASIC METABOLIC PANEL WITH GFR
BUN: 18 mg/dL (ref 7–25)
CO2: 29 mmol/L (ref 20–32)
Calcium: 9.8 mg/dL (ref 8.6–10.3)
Chloride: 101 mmol/L (ref 98–110)
Creat: 1.08 mg/dL (ref 0.70–1.22)
Glucose, Bld: 123 mg/dL — ABNORMAL HIGH (ref 65–99)
Potassium: 4.9 mmol/L (ref 3.5–5.3)
Sodium: 136 mmol/L (ref 135–146)
eGFR: 69 mL/min/{1.73_m2} (ref >=60)

## 2021-11-13 LAB — LIPID PANEL
Cholesterol: 143 mg/dL (ref <200)
HDL: 49 mg/dL (ref >=40)
LDL Cholesterol (Calc): 74 mg/dL (calc)
Non-HDL Cholesterol (Calc): 94 mg/dL (calc) (ref <130)
Total CHOL/HDL Ratio: 2.9 (calc) (ref <5.0)
Triglycerides: 112 mg/dL (ref <150)

## 2021-11-13 LAB — PROTEIN / CREATININE RATIO, URINE
Creatinine, Urine: 89 mg/dL (ref 20–320)
Protein/Creat Ratio: 56 mg/g creat (ref 25–148)
Protein/Creatinine Ratio: 0.056 mg/mg creat (ref 0.025–0.148)
Total Protein, Urine: 5 mg/dL (ref 5–25)

## 2021-12-07 DIAGNOSIS — Z87891 Personal history of nicotine dependence: Secondary | ICD-10-CM | POA: Diagnosis not present

## 2021-12-07 DIAGNOSIS — N400 Enlarged prostate without lower urinary tract symptoms: Secondary | ICD-10-CM | POA: Diagnosis not present

## 2021-12-07 DIAGNOSIS — M21371 Foot drop, right foot: Secondary | ICD-10-CM | POA: Diagnosis not present

## 2021-12-22 DIAGNOSIS — M79674 Pain in right toe(s): Secondary | ICD-10-CM | POA: Diagnosis not present

## 2021-12-22 DIAGNOSIS — M21371 Foot drop, right foot: Secondary | ICD-10-CM | POA: Diagnosis not present

## 2021-12-22 DIAGNOSIS — L603 Nail dystrophy: Secondary | ICD-10-CM | POA: Diagnosis not present

## 2022-02-03 DIAGNOSIS — E2609 Other primary hyperaldosteronism: Secondary | ICD-10-CM | POA: Diagnosis not present

## 2022-02-03 DIAGNOSIS — E876 Hypokalemia: Secondary | ICD-10-CM | POA: Diagnosis not present

## 2022-02-03 DIAGNOSIS — R809 Proteinuria, unspecified: Secondary | ICD-10-CM | POA: Diagnosis not present

## 2022-02-03 DIAGNOSIS — I5032 Chronic diastolic (congestive) heart failure: Secondary | ICD-10-CM | POA: Diagnosis not present

## 2022-02-03 DIAGNOSIS — I129 Hypertensive chronic kidney disease with stage 1 through stage 4 chronic kidney disease, or unspecified chronic kidney disease: Secondary | ICD-10-CM | POA: Diagnosis not present

## 2022-02-03 DIAGNOSIS — E896 Postprocedural adrenocortical (-medullary) hypofunction: Secondary | ICD-10-CM | POA: Diagnosis not present

## 2022-02-03 DIAGNOSIS — N182 Chronic kidney disease, stage 2 (mild): Secondary | ICD-10-CM | POA: Diagnosis not present

## 2022-02-07 IMAGING — CR DG LUMBAR SPINE 2-3V
2 series · 2 of 2 positions shown · non-contrast
Comparison: MRI 09/14/2019.

CLINICAL DATA: Localization image.

EXAM:
LUMBAR SPINE - 2-3 VIEW

[lateral (1 of 2)]
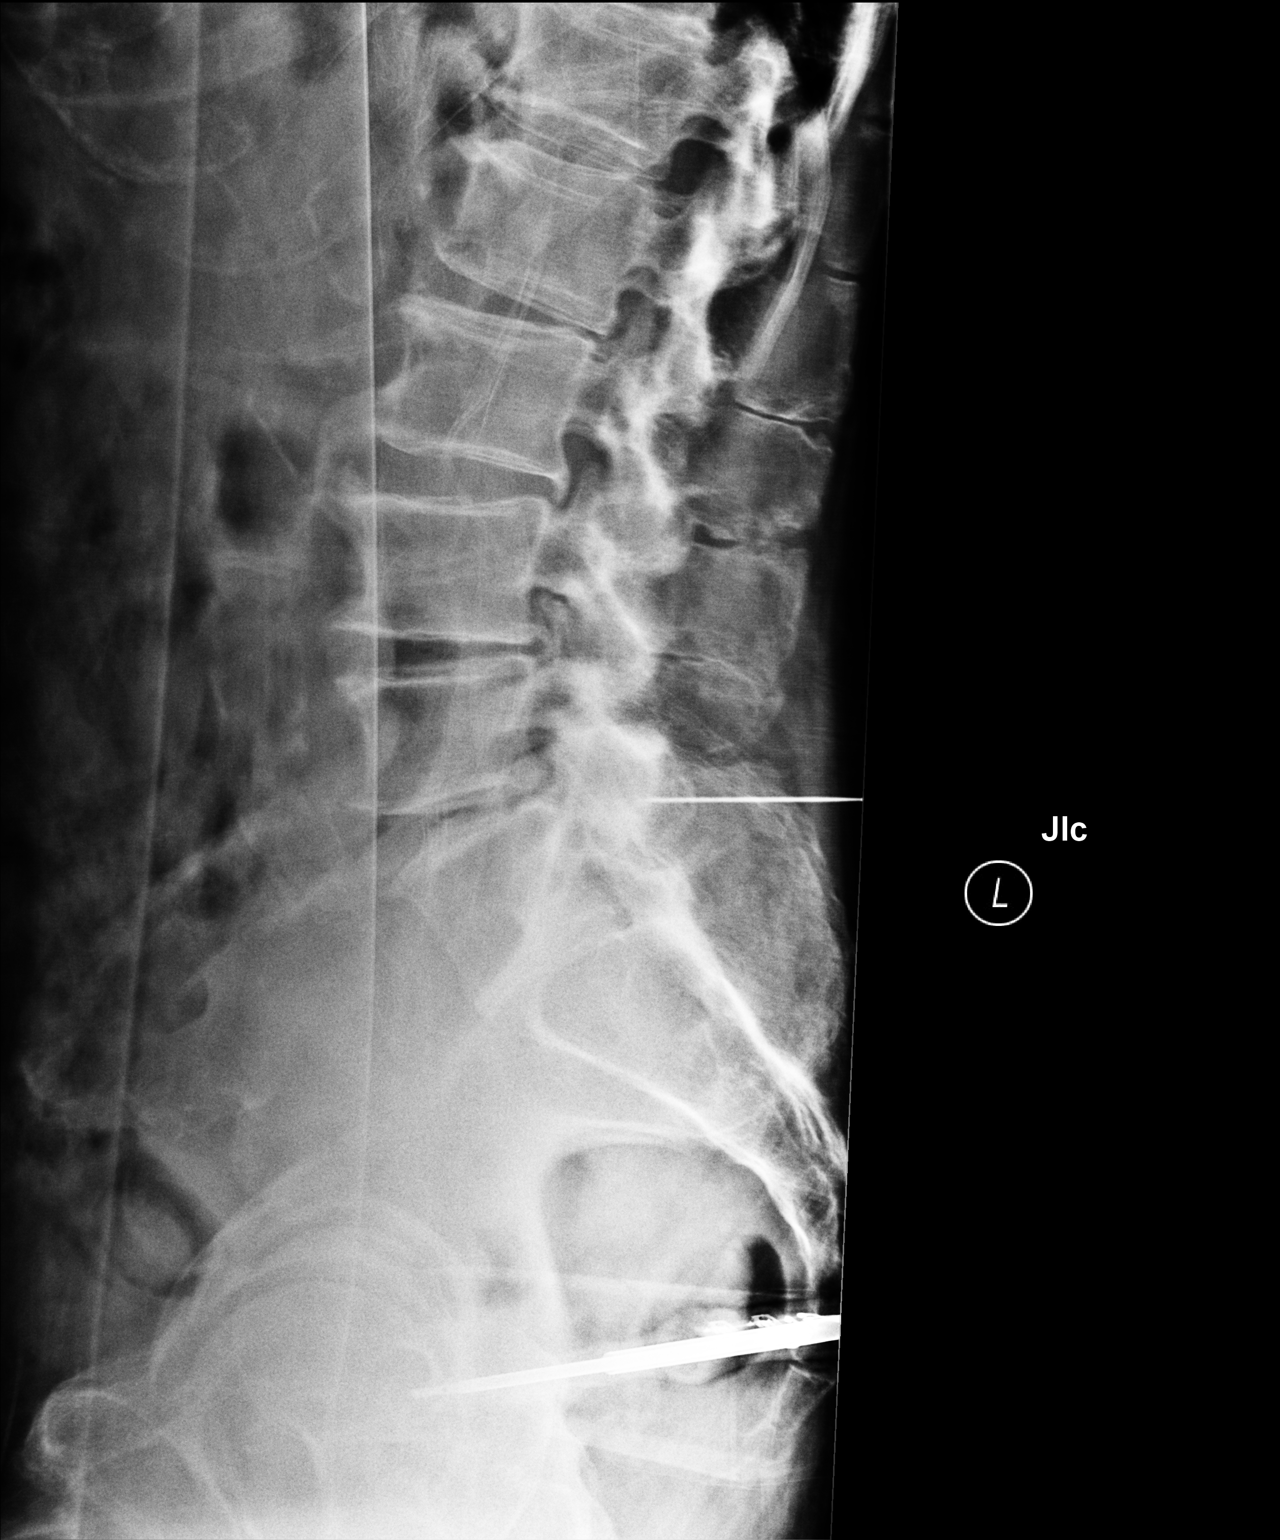

[lateral (2 of 2)]
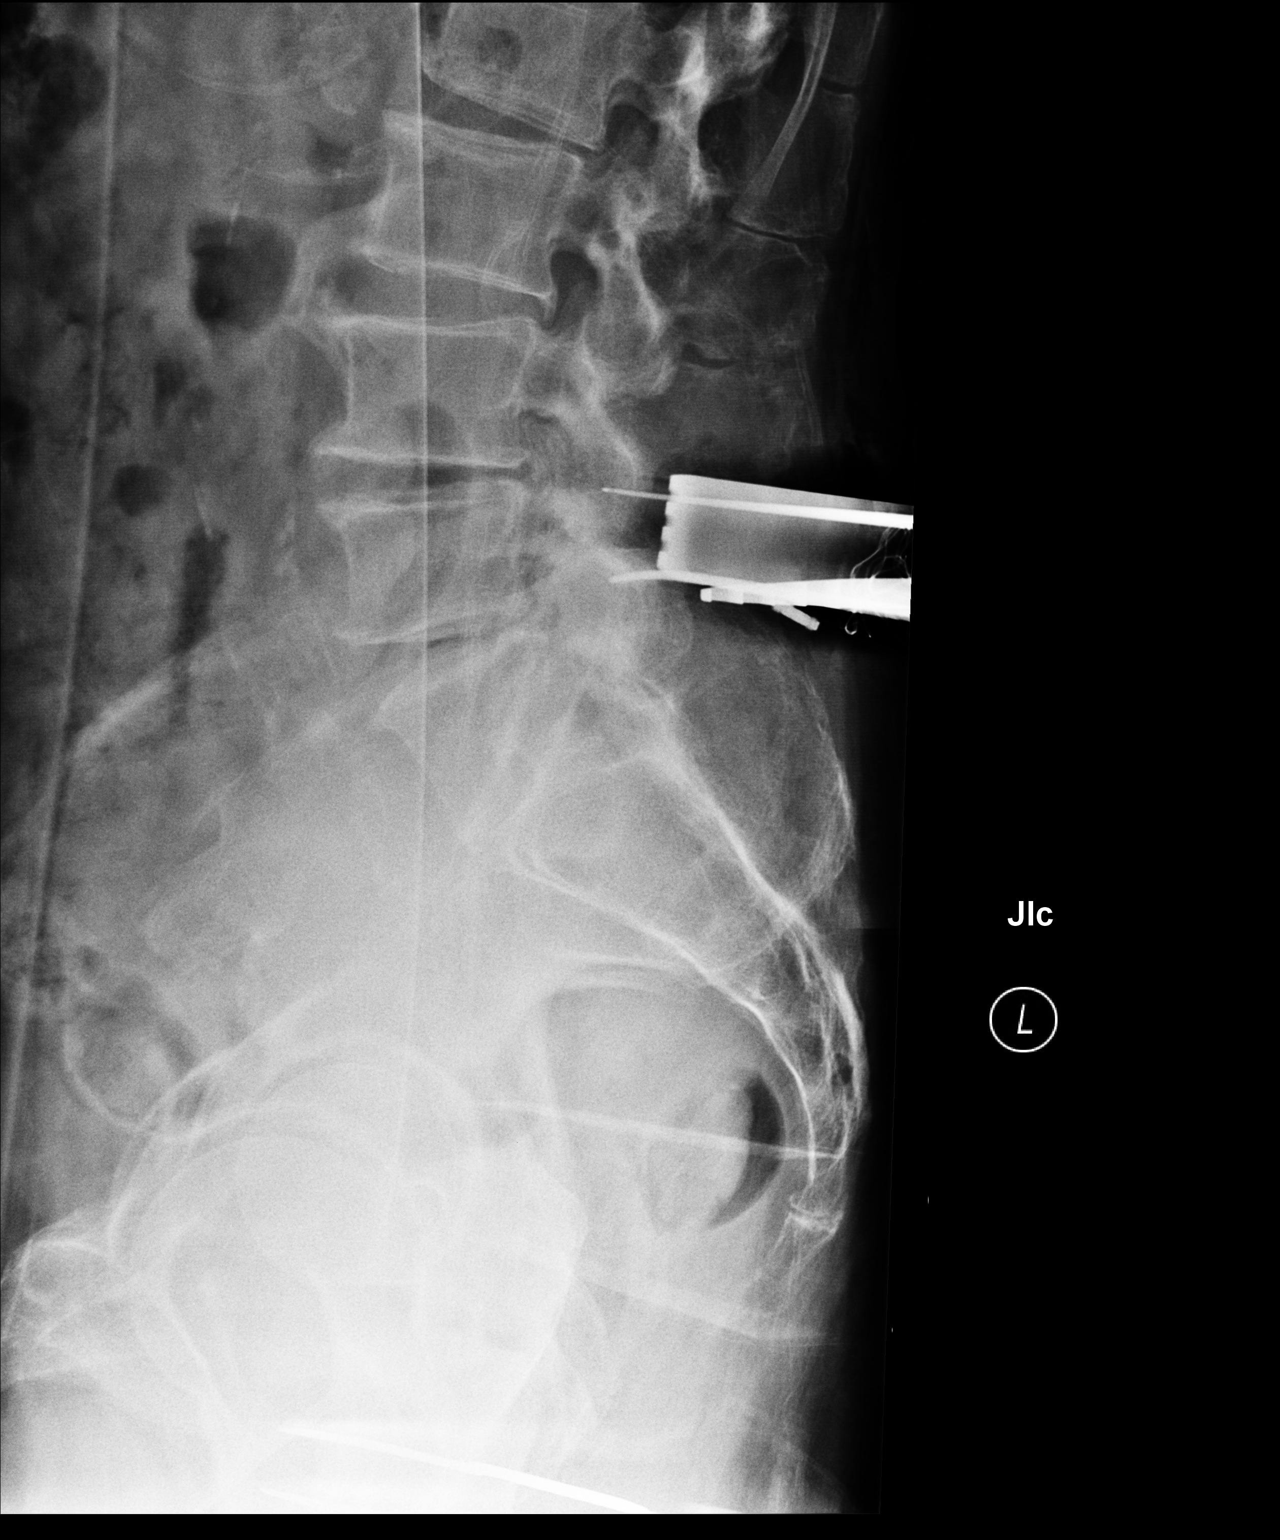

[2 of 2 positions shown; findings below may reference images not displayed]

FINDINGS: Using the same numbering terminology. Initial film shows a needle
directed at the level of the L5-S1 disc space. Second film shows
probes directed at the L4-5 and L5-S1 disc spaces.
IMPRESSION: L4-5 and L5-S1 localized.

## 2022-02-08 DIAGNOSIS — E896 Postprocedural adrenocortical (-medullary) hypofunction: Secondary | ICD-10-CM | POA: Diagnosis not present

## 2022-02-08 DIAGNOSIS — E2609 Other primary hyperaldosteronism: Secondary | ICD-10-CM | POA: Diagnosis not present

## 2022-02-08 DIAGNOSIS — E876 Hypokalemia: Secondary | ICD-10-CM | POA: Diagnosis not present

## 2022-02-08 DIAGNOSIS — I129 Hypertensive chronic kidney disease with stage 1 through stage 4 chronic kidney disease, or unspecified chronic kidney disease: Secondary | ICD-10-CM | POA: Diagnosis not present

## 2022-02-08 DIAGNOSIS — N182 Chronic kidney disease, stage 2 (mild): Secondary | ICD-10-CM | POA: Diagnosis not present

## 2022-02-08 DIAGNOSIS — I5032 Chronic diastolic (congestive) heart failure: Secondary | ICD-10-CM | POA: Diagnosis not present

## 2022-02-08 DIAGNOSIS — R809 Proteinuria, unspecified: Secondary | ICD-10-CM | POA: Diagnosis not present

## 2022-02-10 DIAGNOSIS — Z6828 Body mass index (BMI) 28.0-28.9, adult: Secondary | ICD-10-CM | POA: Diagnosis not present

## 2022-02-10 DIAGNOSIS — M48062 Spinal stenosis, lumbar region with neurogenic claudication: Secondary | ICD-10-CM | POA: Diagnosis not present

## 2022-02-13 ENCOUNTER — Other Ambulatory Visit: Payer: Self-pay

## 2022-02-13 ENCOUNTER — Emergency Department (HOSPITAL_COMMUNITY): Payer: PPO

## 2022-02-13 ENCOUNTER — Inpatient Hospital Stay (HOSPITAL_COMMUNITY)
Admission: EM | Admit: 2022-02-13 | Discharge: 2022-02-16 | DRG: 871 | Disposition: A | Discharge status: Home / Self Care | Payer: PPO | Attending: Internal Medicine | Admitting: Internal Medicine

## 2022-02-13 DIAGNOSIS — Z7982 Long term (current) use of aspirin: Secondary | ICD-10-CM

## 2022-02-13 DIAGNOSIS — Z8673 Personal history of transient ischemic attack (TIA), and cerebral infarction without residual deficits: Secondary | ICD-10-CM | POA: Diagnosis not present

## 2022-02-13 DIAGNOSIS — K760 Fatty (change of) liver, not elsewhere classified: Secondary | ICD-10-CM | POA: Diagnosis not present

## 2022-02-13 DIAGNOSIS — R6520 Severe sepsis without septic shock: Secondary | ICD-10-CM | POA: Diagnosis not present

## 2022-02-13 DIAGNOSIS — J1282 Pneumonia due to coronavirus disease 2019: Secondary | ICD-10-CM | POA: Diagnosis present

## 2022-02-13 DIAGNOSIS — Z951 Presence of aortocoronary bypass graft: Secondary | ICD-10-CM | POA: Diagnosis not present

## 2022-02-13 DIAGNOSIS — Z961 Presence of intraocular lens: Secondary | ICD-10-CM | POA: Diagnosis not present

## 2022-02-13 DIAGNOSIS — R197 Diarrhea, unspecified: Secondary | ICD-10-CM | POA: Diagnosis not present

## 2022-02-13 DIAGNOSIS — J189 Pneumonia, unspecified organism: Secondary | ICD-10-CM | POA: Insufficient documentation

## 2022-02-13 DIAGNOSIS — I10 Essential (primary) hypertension: Secondary | ICD-10-CM | POA: Diagnosis present

## 2022-02-13 DIAGNOSIS — R0902 Hypoxemia: Secondary | ICD-10-CM | POA: Diagnosis not present

## 2022-02-13 DIAGNOSIS — Z808 Family history of malignant neoplasm of other organs or systems: Secondary | ICD-10-CM

## 2022-02-13 DIAGNOSIS — A419 Sepsis, unspecified organism: Secondary | ICD-10-CM | POA: Diagnosis not present

## 2022-02-13 DIAGNOSIS — K219 Gastro-esophageal reflux disease without esophagitis: Secondary | ICD-10-CM | POA: Diagnosis not present

## 2022-02-13 DIAGNOSIS — I252 Old myocardial infarction: Secondary | ICD-10-CM

## 2022-02-13 DIAGNOSIS — E782 Mixed hyperlipidemia: Secondary | ICD-10-CM | POA: Diagnosis not present

## 2022-02-13 DIAGNOSIS — U071 COVID-19: Secondary | ICD-10-CM | POA: Insufficient documentation

## 2022-02-13 DIAGNOSIS — I2510 Atherosclerotic heart disease of native coronary artery without angina pectoris: Secondary | ICD-10-CM | POA: Diagnosis present

## 2022-02-13 DIAGNOSIS — Z9842 Cataract extraction status, left eye: Secondary | ICD-10-CM | POA: Diagnosis not present

## 2022-02-13 DIAGNOSIS — R531 Weakness: Secondary | ICD-10-CM | POA: Diagnosis not present

## 2022-02-13 DIAGNOSIS — J9601 Acute respiratory failure with hypoxia: Secondary | ICD-10-CM | POA: Diagnosis not present

## 2022-02-13 DIAGNOSIS — Z888 Allergy status to other drugs, medicaments and biological substances status: Secondary | ICD-10-CM

## 2022-02-13 DIAGNOSIS — J159 Unspecified bacterial pneumonia: Secondary | ICD-10-CM | POA: Diagnosis present

## 2022-02-13 DIAGNOSIS — E872 Acidosis, unspecified: Secondary | ICD-10-CM | POA: Diagnosis not present

## 2022-02-13 DIAGNOSIS — Y95 Nosocomial condition: Secondary | ICD-10-CM | POA: Insufficient documentation

## 2022-02-13 DIAGNOSIS — R509 Fever, unspecified: Secondary | ICD-10-CM | POA: Diagnosis not present

## 2022-02-13 DIAGNOSIS — Z9841 Cataract extraction status, right eye: Secondary | ICD-10-CM | POA: Diagnosis not present

## 2022-02-13 DIAGNOSIS — E876 Hypokalemia: Secondary | ICD-10-CM | POA: Diagnosis not present

## 2022-02-13 DIAGNOSIS — R0602 Shortness of breath: Secondary | ICD-10-CM | POA: Diagnosis not present

## 2022-02-13 DIAGNOSIS — Z8249 Family history of ischemic heart disease and other diseases of the circulatory system: Secondary | ICD-10-CM | POA: Diagnosis not present

## 2022-02-13 DIAGNOSIS — Z9049 Acquired absence of other specified parts of digestive tract: Secondary | ICD-10-CM | POA: Diagnosis not present

## 2022-02-13 DIAGNOSIS — Z981 Arthrodesis status: Secondary | ICD-10-CM

## 2022-02-13 DIAGNOSIS — R0689 Other abnormalities of breathing: Secondary | ICD-10-CM | POA: Diagnosis not present

## 2022-02-13 DIAGNOSIS — R000 Tachycardia, unspecified: Secondary | ICD-10-CM | POA: Diagnosis not present

## 2022-02-13 DIAGNOSIS — Z79899 Other long term (current) drug therapy: Secondary | ICD-10-CM

## 2022-02-13 LAB — LACTIC ACID, PLASMA
Lactic Acid, Venous: 1.6 mmol/L (ref 0.5–1.9)
Lactic Acid, Venous: 2.2 mmol/L (ref 0.5–1.9)

## 2022-02-13 LAB — COMPREHENSIVE METABOLIC PANEL
ALT: 43 U/L (ref 0–44)
AST: 44 U/L — ABNORMAL HIGH (ref 15–41)
Albumin: 3.8 g/dL (ref 3.5–5.0)
Alkaline Phosphatase: 66 U/L (ref 38–126)
Anion gap: 12 (ref 5–15)
BUN: 19 mg/dL (ref 8–23)
CO2: 20 mmol/L — ABNORMAL LOW (ref 22–32)
Calcium: 8.2 mg/dL — ABNORMAL LOW (ref 8.9–10.3)
Chloride: 102 mmol/L (ref 98–111)
Creatinine, Ser: 1.18 mg/dL (ref 0.61–1.24)
GFR, Estimated: >60 mL/min (ref >60)
Glucose, Bld: 118 mg/dL — ABNORMAL HIGH (ref 70–99)
Potassium: 2.9 mmol/L — ABNORMAL LOW (ref 3.5–5.1)
Sodium: 134 mmol/L — ABNORMAL LOW (ref 135–145)
Total Bilirubin: 0.7 mg/dL (ref 0.3–1.2)
Total Protein: 7.4 g/dL (ref 6.5–8.1)

## 2022-02-13 LAB — CBC WITH DIFFERENTIAL/PLATELET
Abs Immature Granulocytes: 0.04 10*3/uL (ref 0.00–0.07)
Basophils Absolute: 0.1 10*3/uL (ref 0.0–0.1)
Basophils Relative: 1 %
Eosinophils Absolute: 0.1 10*3/uL (ref 0.0–0.5)
Eosinophils Relative: 1 %
HCT: 38.9 % — ABNORMAL LOW (ref 39.0–52.0)
Hemoglobin: 13.3 g/dL (ref 13.0–17.0)
Immature Granulocytes: 0 %
Lymphocytes Relative: 9 %
Lymphs Abs: 0.9 10*3/uL (ref 0.7–4.0)
MCH: 30.6 pg (ref 26.0–34.0)
MCHC: 34.2 g/dL (ref 30.0–36.0)
MCV: 89.4 fL (ref 80.0–100.0)
Monocytes Absolute: 0.8 10*3/uL (ref 0.1–1.0)
Monocytes Relative: 7 %
Neutro Abs: 8.9 10*3/uL — ABNORMAL HIGH (ref 1.7–7.7)
Neutrophils Relative %: 82 %
Platelets: 159 10*3/uL (ref 150–400)
RBC: 4.35 MIL/uL (ref 4.22–5.81)
RDW: 12.4 % (ref 11.5–15.5)
WBC: 10.8 10*3/uL — ABNORMAL HIGH (ref 4.0–10.5)
nRBC: 0 % (ref 0.0–0.2)

## 2022-02-13 LAB — PROTIME-INR
INR: 1.2 (ref 0.8–1.2)
Prothrombin Time: 14.9 seconds (ref 11.4–15.2)

## 2022-02-13 LAB — RESP PANEL BY RT-PCR (RSV, FLU A&B, COVID)  RVPGX2
Influenza A by PCR: NEGATIVE
Influenza B by PCR: NEGATIVE
Resp Syncytial Virus by PCR: NEGATIVE
SARS Coronavirus 2 by RT PCR: POSITIVE — AB

## 2022-02-13 LAB — MAGNESIUM: Magnesium: 1.7 mg/dL (ref 1.7–2.4)

## 2022-02-13 MED ORDER — METHYLPREDNISOLONE SODIUM SUCC 125 MG IJ SOLR
125.0000 mg | Freq: Every day | INTRAMUSCULAR | Status: DC
  Administered 2022-02-14 – 2022-02-16 (×3): 125 mg via INTRAVENOUS
  Filled 2022-02-13 (×3): qty 2

## 2022-02-13 MED ORDER — IOHEXOL 350 MG/ML SOLN
100.0000 mL | Freq: Once | INTRAVENOUS | Status: AC | PRN
  Administered 2022-02-13: 100 mL via INTRAVENOUS

## 2022-02-13 MED ORDER — LACTATED RINGERS IV BOLUS (SEPSIS)
1500.0000 mL | Freq: Once | INTRAVENOUS | Status: AC
  Administered 2022-02-13: 1500 mL via INTRAVENOUS

## 2022-02-13 MED ORDER — LACTATED RINGERS IV SOLN: INTRAVENOUS | Status: AC

## 2022-02-13 MED ORDER — BARICITINIB 2 MG PO TABS
4.0000 mg | ORAL_TABLET | Freq: Every day | ORAL | Status: DC
  Administered 2022-02-14 – 2022-02-15 (×2): 4 mg via ORAL
  Filled 2022-02-13 (×3): qty 2

## 2022-02-13 MED ORDER — POTASSIUM CHLORIDE 10 MEQ/100ML IV SOLN
10.0000 meq | INTRAVENOUS | Status: AC
  Administered 2022-02-13 – 2022-02-14 (×6): 10 meq via INTRAVENOUS
  Filled 2022-02-13 (×6): qty 100

## 2022-02-13 MED ORDER — SODIUM CHLORIDE 0.9 % IV SOLN
2.0000 g | INTRAVENOUS | Status: DC
  Administered 2022-02-13 – 2022-02-15 (×3): 2 g via INTRAVENOUS
  Filled 2022-02-13 (×3): qty 20

## 2022-02-13 MED ORDER — SODIUM CHLORIDE 0.9 % IV SOLN
500.0000 mg | INTRAVENOUS | Status: DC
  Administered 2022-02-13 – 2022-02-15 (×3): 500 mg via INTRAVENOUS
  Filled 2022-02-13 (×3): qty 5

## 2022-02-13 MED ORDER — ONDANSETRON HCL 4 MG/2ML IJ SOLN
4.0000 mg | Freq: Once | INTRAMUSCULAR | Status: AC
  Administered 2022-02-13: 4 mg via INTRAVENOUS
  Filled 2022-02-13: qty 2

## 2022-02-13 NOTE — ED Notes (Signed)
ED Provider at bedside. 

## 2022-02-13 NOTE — ED Provider Notes (Addendum)
[facility] Provider Note   CSN: [id number] Arrival date & time: 02/13/22  2035     History  Chief Complaint  Patient presents with   Cough    SY Christopher Booth is a 82 y.o. male.   Cough Associated symptoms: fever   Patient presents for cough, congestion, and generalized weakness.  Medical history includes HTN, HLD, CAD, arthritis.  Symptoms have been persistent over the past week.  His cough has been productive of white sputum.  Additional symptoms include nausea and vomiting.  He did not have any diarrhea until today.  EMS noted temperature of 101.9 degrees on scene.  He was given 500 cc IVF and 1 g of Tylenol prior to arrival.  SpO2 was 90% on room air and he was placed on 2 L of supplemental oxygen.  Patient lives at home and cares for his elderly wife.  He denies any recent dysuria.  He denies any abdominal pain.     Home Medications Prior to Admission medications   Medication Sig Start Date End Date Taking? Authorizing Provider  aspirin 81 MG EC tablet Take 81 mg by mouth daily.    [provider]  atorvastatin (LIPITOR) 20 MG tablet TAKE ONE TABLET BY MOUTH ONCE DAILY. 08/17/21   Susy Frizzle, MD  carvedilol (COREG) 12.5 MG tablet Take 12.5 mg by mouth in the morning and at bedtime.    [provider]  diphenhydramine-acetaminophen (TYLENOL PM) 25-500 MG TABS tablet Take 1 tablet by mouth at bedtime as needed.    [provider]  eplerenone (INSPRA) 25 MG tablet Take 50 mg by mouth 2 (two) times daily. 03/31/21   [provider]  losartan (COZAAR) 25 MG tablet Take 1 tablet (25 mg total) by mouth daily. 09/30/20   Susy Frizzle, MD  Multiple Vitamin (MULTIVITAMIN WITH MINERALS) TABS tablet Take 1 tablet by mouth daily.    [provider]  potassium chloride SA (KLOR-CON) 20 MEQ tablet Take 40 mEq by mouth daily. 09/21/19   [provider]  telmisartan-hydrochlorothiazide  (MICARDIS HCT) 80-12.5 MG per tablet Take 1 tablet by mouth daily.  04/13/11  [provider]      Allergies    Diovan [valsartan]    Review of Systems   Review of Systems  Constitutional:  Positive for activity change, appetite change, fatigue and fever.  Respiratory:  Positive for cough.   Gastrointestinal:  Positive for diarrhea, nausea and vomiting.  Neurological:  Positive for weakness (Generalized).  All other systems reviewed and are negative.   Physical Exam Updated Vital Signs BP 102/65   Pulse (!) 101   Temp (!) 101.5 F (38.6 C) (Oral)   Resp (!) 26   Ht '5\' 9"'$  (1.753 m)   Wt 79.7 kg   SpO2 95%   BMI 25.95 kg/m  Physical Exam Vitals and nursing note reviewed.  Constitutional:      General: He is not in acute distress.    Appearance: Normal appearance. He is well-developed. He is ill-appearing. He is not toxic-appearing or diaphoretic.  HENT:     Head: Normocephalic and atraumatic.     Right Ear: External ear normal.     Left Ear: External ear normal.     Nose: Nose normal.     Mouth/Throat:     Mouth: Mucous membranes are moist.  Eyes:     Extraocular Movements: Extraocular movements intact.     Conjunctiva/sclera: Conjunctivae normal.  Cardiovascular:     Rate and Rhythm: Regular rhythm. Tachycardia present.     Heart sounds: No murmur heard. Pulmonary:     Effort: Pulmonary effort is normal. Tachypnea present. No respiratory distress.     Breath sounds: Transmitted upper airway sounds present. Rhonchi and rales present. No decreased breath sounds or wheezing.  Abdominal:     General: There is no distension.     Palpations: Abdomen is soft.     Tenderness: There is no abdominal tenderness.  Musculoskeletal:        General: No swelling. Normal range of motion.     Cervical back: Normal range of motion and neck supple.     Right lower leg: No edema.     Left lower leg: No edema.  Skin:    General: Skin is warm and dry.     Capillary Refill:  Capillary refill takes less than 2 seconds.     Coloration: Skin is not jaundiced or pale.  Neurological:     General: No focal deficit present.     Mental Status: He is alert and oriented to person, place, and time.     Cranial Nerves: No cranial nerve deficit.     Sensory: No sensory deficit.     Motor: No weakness.     Coordination: Coordination normal.  Psychiatric:        Mood and Affect: Mood normal.        Behavior: Behavior normal.        Thought Content: Thought content normal.        Judgment: Judgment normal.     ED Results / Procedures / Treatments   Labs (all labs ordered are listed, but only abnormal results are displayed) Labs Reviewed  RESP PANEL BY RT-PCR (RSV, FLU A&B, COVID)  RVPGX2 - Abnormal; Notable for the following components:      Result Value   SARS Coronavirus 2 by RT PCR POSITIVE (*)    All other components within normal limits  COMPREHENSIVE METABOLIC PANEL - Abnormal; Notable for the following components:   Sodium 134 (*)    Potassium 2.9 (*)    CO2 20 (*)    Glucose, Bld 118 (*)    Calcium 8.2 (*)    AST 44 (*)    All other components within normal limits  LACTIC ACID, PLASMA - Abnormal; Notable for the following components:   Lactic Acid, Venous 2.2 (*)    All other components within normal limits  CBC WITH DIFFERENTIAL/PLATELET - Abnormal; Notable for the following components:   WBC 10.8 (*)    HCT 38.9 (*)    Neutro Abs 8.9 (*)    All other components within normal limits  CULTURE, BLOOD (ROUTINE X 2)  CULTURE, BLOOD (ROUTINE X 2)  URINE CULTURE  LACTIC ACID, PLASMA  PROTIME-INR  MAGNESIUM  URINALYSIS, ROUTINE W REFLEX MICROSCOPIC  PROCALCITONIN  PROCALCITONIN    EKG EKG Interpretation  Date/Time:  Saturday February 13 2022 20:53:39 EST Ventricular Rate:  98 PR Interval:  200 QRS Duration: 99 QT Interval:  338 QTC Calculation: 432 R Axis:   216 Text Interpretation: Sinus rhythm Probable right ventricular hypertrophy  Borderline T abnormalities, anterior leads Confirmed by Godfrey Pick ([id number]) on 02/13/2022 9:15:03 PM  Radiology CT Angio Chest PE W and/or Wo Contrast  Result Date: 02/13/2022 CLINICAL DATA:  Abdominal pain.  Concern for pulmonary embolism. EXAM: CT ANGIOGRAPHY CHEST CT ABDOMEN AND PELVIS WITH CONTRAST TECHNIQUE: Multidetector CT imaging of the chest  was performed using the standard protocol during bolus administration of intravenous contrast. Multiplanar CT image reconstructions and MIPs were obtained to evaluate the vascular anatomy. Multidetector CT imaging of the abdomen and pelvis was performed using the standard protocol during bolus administration of intravenous contrast. RADIATION DOSE REDUCTION: This exam was performed according to the departmental dose-optimization program which includes automated exposure control, adjustment of the mA and/or kV according to patient size and/or use of iterative reconstruction technique. CONTRAST:  167m OMNIPAQUE IOHEXOL 350 MG/ML SOLN COMPARISON:  Chest radiograph dated 02/13/2022 and CT abdomen pelvis dated 06/23/2021. FINDINGS: Evaluation of this exam is limited due to respiratory motion artifact. CTA CHEST FINDINGS Cardiovascular: There is no cardiomegaly or pericardial effusion. There is 3 vessel coronary vascular calcification and postsurgical changes of CABG. There is mild atherosclerotic calcification of the thoracic aorta no aneurysmal dilatation or dissection. The origins of the great vessels of the aortic arch appear patent. Evaluation of the pulmonary arteries is very limited due to severe respiratory motion and suboptimal visualization of the peripheral branches. No obvious large or central pulmonary artery embolus identified. Mediastinum/Nodes: No hilar or mediastinal adenopathy. The esophagus is grossly unremarkable. No mediastinal fluid collection. Lungs/Pleura: Patchy area of nodular consolidation involving the left lower lobe and to a lesser degree in  the right lower lobe may represent developing infiltrate or aspiration. There is no pleural effusion or pneumothorax. The central airways remain patent. Musculoskeletal: Degenerative changes of the spine. Median sternotomy wires. No acute osseous pathology. Review of the MIP images confirms the above findings. CT ABDOMEN and PELVIS FINDINGS No intra-abdominal free air or free fluid. Hepatobiliary: Fatty appearing liver. No biliary dilatation. Cholecystectomy. No retained calcified stone noted in the central CBD. Pancreas: Unremarkable. No pancreatic ductal dilatation or surrounding inflammatory changes. Spleen: Normal in size without focal abnormality. Adrenals/Urinary Tract: The left adrenal gland is unremarkable. 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 There is no hydronephrosis on either side. There is symmetric enhancement and excretion of contrast by both kidneys. The visualized ureters and urinary bladder appear unremarkable. Stomach/Bowel: There is loose stool throughout the colon consistent with diarrheal state. Correlation with clinical exam and stool cultures recommended. There is no bowel obstruction or active inflammation. The appendix is unremarkable. Vascular/Lymphatic: Advanced aortoiliac atherosclerotic disease. The IVC is unremarkable. No portal venous gas. There is no adenopathy. Reproductive: The prostate and seminal vesicles are grossly unremarkable. No pelvic mass. Other: None Musculoskeletal: Osteopenia with degenerative changes of the spine. No acute osseous pathology. Review of the MIP images confirms the above findings. IMPRESSION: 1. No obvious large or central pulmonary artery embolus on motion degraded exam. 2. Bibasilar, left greater right, developing infiltrate or aspiration. 3. Diarrheal state. Correlation with clinical exam and stool cultures recommended. No bowel obstruction. Normal appendix. 4. Fatty liver. 5.  Aortic Atherosclerosis (ICD10-I70.0). Electronically Signed   By:  AAnner CreteM.D.   On: 02/13/2022 22:40   CT ABDOMEN PELVIS W CONTRAST  Result Date: 02/13/2022 CLINICAL DATA:  Abdominal pain.  Concern for pulmonary embolism. EXAM: CT ANGIOGRAPHY CHEST CT ABDOMEN AND PELVIS WITH CONTRAST TECHNIQUE: Multidetector CT imaging of the chest was performed using the standard protocol during bolus administration of intravenous contrast. Multiplanar CT image reconstructions and MIPs were obtained to evaluate the vascular anatomy. Multidetector CT imaging of the abdomen and pelvis was performed using the standard protocol during bolus administration of intravenous contrast. RADIATION DOSE REDUCTION: This exam was performed according to the departmental dose-optimization program which includes automated exposure control,  adjustment of the mA and/or kV according to patient size and/or use of iterative reconstruction technique. CONTRAST:  153m OMNIPAQUE IOHEXOL 350 MG/ML SOLN COMPARISON:  Chest radiograph dated 02/13/2022 and CT abdomen pelvis dated 06/23/2021. FINDINGS: Evaluation of this exam is limited due to respiratory motion artifact. CTA CHEST FINDINGS Cardiovascular: There is no cardiomegaly or pericardial effusion. There is 3 vessel coronary vascular calcification and postsurgical changes of CABG. There is mild atherosclerotic calcification of the thoracic aorta no aneurysmal dilatation or dissection. The origins of the great vessels of the aortic arch appear patent. Evaluation of the pulmonary arteries is very limited due to severe respiratory motion and suboptimal visualization of the peripheral branches. No obvious large or central pulmonary artery embolus identified. Mediastinum/Nodes: No hilar or mediastinal adenopathy. The esophagus is grossly unremarkable. No mediastinal fluid collection. Lungs/Pleura: Patchy area of nodular consolidation involving the left lower lobe and to a lesser degree in the right lower lobe may represent developing infiltrate or aspiration.  There is no pleural effusion or pneumothorax. The central airways remain patent. Musculoskeletal: Degenerative changes of the spine. Median sternotomy wires. No acute osseous pathology. Review of the MIP images confirms the above findings. CT ABDOMEN and PELVIS FINDINGS No intra-abdominal free air or free fluid. Hepatobiliary: Fatty appearing liver. No biliary dilatation. Cholecystectomy. No retained calcified stone noted in the central CBD. Pancreas: Unremarkable. No pancreatic ductal dilatation or surrounding inflammatory changes. Spleen: Normal in size without focal abnormality. Adrenals/Urinary Tract: The left adrenal gland is unremarkable. 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 There is no hydronephrosis on either side. There is symmetric enhancement and excretion of contrast by both kidneys. The visualized ureters and urinary bladder appear unremarkable. Stomach/Bowel: There is loose stool throughout the colon consistent with diarrheal state. Correlation with clinical exam and stool cultures recommended. There is no bowel obstruction or active inflammation. The appendix is unremarkable. Vascular/Lymphatic: Advanced aortoiliac atherosclerotic disease. The IVC is unremarkable. No portal venous gas. There is no adenopathy. Reproductive: The prostate and seminal vesicles are grossly unremarkable. No pelvic mass. Other: None Musculoskeletal: Osteopenia with degenerative changes of the spine. No acute osseous pathology. Review of the MIP images confirms the above findings. IMPRESSION: 1. No obvious large or central pulmonary artery embolus on motion degraded exam. 2. Bibasilar, left greater right, developing infiltrate or aspiration. 3. Diarrheal state. Correlation with clinical exam and stool cultures recommended. No bowel obstruction. Normal appendix. 4. Fatty liver. 5.  Aortic Atherosclerosis (ICD10-I70.0). Electronically Signed   By: AAnner CreteM.D.   On: 02/13/2022 22:40   DG Chest Portable 1  View  Result Date: 02/13/2022 CLINICAL DATA:  Shortness of breath. EXAM: PORTABLE CHEST 1 VIEW COMPARISON:  06/23/2021. FINDINGS: The heart is enlarged and the mediastinal contour is stable. There is atherosclerotic calcification of the aorta. No consolidation, effusion, or pneumothorax. No acute osseous abnormality. Sternotomy wires are present over the midline. IMPRESSION: No active disease. Electronically Signed   By: LBrett FairyM.D.   On: 02/13/2022 21:05    Procedures Procedures    Medications Ordered in ED Medications  lactated ringers infusion ( Intravenous New Bag/Given 02/13/22 2128)  cefTRIAXone (ROCEPHIN) 2 g in sodium chloride 0.9 % 100 mL IVPB (0 g Intravenous Stopped 02/13/22 2210)  azithromycin (ZITHROMAX) 500 mg in sodium chloride 0.9 % 250 mL IVPB (0 mg Intravenous Stopped 02/13/22 2341)  potassium chloride 10 mEq in 100 mL IVPB (10 mEq Intravenous New Bag/Given 02/13/22 2232)  methylPREDNISolone sodium succinate (SOLU-MEDROL) 125 mg/2 mL injection 125  mg (has no administration in time range)  baricitinib (OLUMIANT) tablet 4 mg (has no administration in time range)  lactated ringers bolus 1,500 mL (0 mLs Intravenous Stopped 02/13/22 2342)  ondansetron (ZOFRAN) injection 4 mg (4 mg Intravenous Given 02/13/22 2140)  iohexol (OMNIPAQUE) 350 MG/ML injection 100 mL (100 mLs Intravenous Contrast Given 02/13/22 2206)    ED Course/ Medical Decision Making/ A&P                             Medical Decision Making Amount and/or Complexity of Data Reviewed Labs: ordered. Radiology: ordered.  Risk Prescription drug management. Decision regarding hospitalization.   This patient presents to the ED for concern of cough, fatigue, generalized weakness, this involves an extensive number of treatment options, and is a complaint that carries with it a high risk of complications and morbidity.  The differential diagnosis includes pneumonia, URI, PE, bronchitis   Co morbidities that  complicate the patient evaluation  HTN, HLD, CAD, arthritis   Additional history obtained:  Additional history obtained from N/A External records from outside source obtained and reviewed including EMR   Lab Tests:  I Ordered, and personally interpreted labs.  The pertinent results include: Leukocytosis and lactic acidosis are present consistent with sepsis.  Hemoglobin is normal.  Patient has hypokalemia with otherwise normal electrolytes.  Patient is found to be COVID-positive.   Imaging Studies ordered:  I ordered imaging studies including chest x-ray, CTA chest, CT of abdomen and pelvis I independently visualized and interpreted imaging which showed left lower lobe pneumonia, diarrheal illness I agree with the radiologist interpretation   Cardiac Monitoring: / EKG:  The patient was maintained on a cardiac monitor.  I personally viewed and interpreted the cardiac monitored which showed an underlying rhythm of: Sinus rhythm  Problem List / ED Course / Critical interventions / Medication management  Patient presents for cough, congestion, fatigue, and generalized weakness.  Patient had fever, tachycardia, and tachypnea prior to arrival.  He did receive 500 cc IVF and 1 g of Tylenol.  On arrival, patient is alert and oriented.  He remains tachypneic.  He has coarse lung sounds and ongoing cough.  His abdomen is soft and nontender.  He did have an episode of diarrhea upon arrival.  Patient's temperature on arrival is 101.5 degrees.  Septic workup and treatment were initiated for presumed pulmonary source.  While in the ED, patient had nausea and vomiting.  Zofran was ordered.  Although no pneumonia was identified on x-ray, CTA of chest did show a left lower lobe infiltrate.  Patient was also found to be COVID-19 positive.  Lab work shows leukocytosis and lactic acidosis consistent with sepsis.  Patient was found to have hypokalemia and replacement potassium was ordered.  Patient to be  admitted to hospitalist for further management. I ordered medication including IV fluids and antibiotics for sepsis with presumed pulmonary source; potassium chloride for hypokalemia; Zofran for nausea. Reevaluation of the patient after these medicines showed that the patient improved I have reviewed the patients home medicines and have made adjustments as needed   Social Determinants of Health:  Has access to outpatient care  CRITICAL CARE Performed by: Godfrey Pick   Total critical care time: 35 minutes  Critical care time was exclusive of separately billable procedures and treating other patients.  Critical care was necessary to treat or prevent imminent or life-threatening deterioration.  Critical care was time spent personally by me  on the following activities: development of treatment plan with patient and/or surrogate as well as nursing, discussions with consultants, evaluation of patient's response to treatment, examination of patient, obtaining history from patient or surrogate, ordering and performing treatments and interventions, ordering and review of laboratory studies, ordering and review of radiographic studies, pulse oximetry and re-evaluation of patient's condition.          Final Clinical Impression(s) / ED Diagnoses Final diagnoses:  Pneumonia of left lower lobe due to infectious organism  COVID-19  Sepsis with acute hypoxic respiratory failure without septic shock, due to unspecified organism [facility])  Hypokalemia    Rx / DC Orders ED Discharge Orders     None         Godfrey Pick, MD 02/14/22 Dyann Kief    Godfrey Pick, MD 02/14/22 0002

## 2022-02-13 NOTE — Sepsis Progress Note (Signed)
Elink following code sepsis.

## 2022-02-13 NOTE — ED Triage Notes (Signed)
Pt arrived via [facility] from home c/o persistent cough, congestion, weakness, incontinence X 1 week. Per [facility], Pts temp was 101.88F on scene. [facility] administered 500cc/ Bolus NS PTA and '1000mg'$  Tylenol PTA. Pt was 90% on RA. Pt placed on 2L Nasal Cannula.

## 2022-02-13 NOTE — ED Notes (Signed)
Patient transported to CT 

## 2022-02-14 ENCOUNTER — Encounter (HOSPITAL_COMMUNITY): Payer: Self-pay | Admitting: Family Medicine

## 2022-02-14 DIAGNOSIS — A419 Sepsis, unspecified organism: Secondary | ICD-10-CM

## 2022-02-14 DIAGNOSIS — U071 COVID-19: Secondary | ICD-10-CM | POA: Diagnosis not present

## 2022-02-14 DIAGNOSIS — J189 Pneumonia, unspecified organism: Secondary | ICD-10-CM | POA: Insufficient documentation

## 2022-02-14 DIAGNOSIS — E782 Mixed hyperlipidemia: Secondary | ICD-10-CM

## 2022-02-14 DIAGNOSIS — Y95 Nosocomial condition: Secondary | ICD-10-CM | POA: Insufficient documentation

## 2022-02-14 DIAGNOSIS — E876 Hypokalemia: Secondary | ICD-10-CM

## 2022-02-14 DIAGNOSIS — R6520 Severe sepsis without septic shock: Secondary | ICD-10-CM

## 2022-02-14 DIAGNOSIS — J9601 Acute respiratory failure with hypoxia: Secondary | ICD-10-CM | POA: Diagnosis not present

## 2022-02-14 DIAGNOSIS — I10 Essential (primary) hypertension: Secondary | ICD-10-CM

## 2022-02-14 LAB — URINALYSIS, ROUTINE W REFLEX MICROSCOPIC
Bacteria, UA: NONE SEEN
Bilirubin Urine: NEGATIVE
Glucose, UA: NEGATIVE mg/dL
Ketones, ur: 5 mg/dL — AB
Leukocytes,Ua: NEGATIVE
Nitrite: NEGATIVE
Protein, ur: 100 mg/dL — AB
Specific Gravity, Urine: 1.035 — ABNORMAL HIGH (ref 1.005–1.030)
pH: 5 (ref 5.0–8.0)

## 2022-02-14 LAB — COMPREHENSIVE METABOLIC PANEL
ALT: 41 U/L (ref 0–44)
ALT: 40 U/L (ref 0–44)
AST: 52 U/L — ABNORMAL HIGH (ref 15–41)
AST: 50 U/L — ABNORMAL HIGH (ref 15–41)
Albumin: 3.5 g/dL (ref 3.5–5.0)
Albumin: 3.3 g/dL — ABNORMAL LOW (ref 3.5–5.0)
Alkaline Phosphatase: 56 U/L (ref 38–126)
Alkaline Phosphatase: 52 U/L (ref 38–126)
Anion gap: 11 (ref 5–15)
Anion gap: 11 (ref 5–15)
BUN: 17 mg/dL (ref 8–23)
BUN: 17 mg/dL (ref 8–23)
CO2: 22 mmol/L (ref 22–32)
CO2: 21 mmol/L — ABNORMAL LOW (ref 22–32)
Calcium: 8.1 mg/dL — ABNORMAL LOW (ref 8.9–10.3)
Calcium: 7.8 mg/dL — ABNORMAL LOW (ref 8.9–10.3)
Chloride: 101 mmol/L (ref 98–111)
Chloride: 99 mmol/L (ref 98–111)
Creatinine, Ser: 1.03 mg/dL (ref 0.61–1.24)
Creatinine, Ser: 0.97 mg/dL (ref 0.61–1.24)
GFR, Estimated: >60 mL/min (ref >60)
GFR, Estimated: >60 mL/min (ref >60)
Glucose, Bld: 84 mg/dL (ref 70–99)
Glucose, Bld: 89 mg/dL (ref 70–99)
Potassium: 3.8 mmol/L (ref 3.5–5.1)
Potassium: 3.3 mmol/L — ABNORMAL LOW (ref 3.5–5.1)
Sodium: 134 mmol/L — ABNORMAL LOW (ref 135–145)
Sodium: 131 mmol/L — ABNORMAL LOW (ref 135–145)
Total Bilirubin: 0.9 mg/dL (ref 0.3–1.2)
Total Bilirubin: 0.9 mg/dL (ref 0.3–1.2)
Total Protein: 7.1 g/dL (ref 6.5–8.1)
Total Protein: 6.6 g/dL (ref 6.5–8.1)

## 2022-02-14 LAB — CBC WITH DIFFERENTIAL/PLATELET
Abs Immature Granulocytes: 0.6 10*3/uL — ABNORMAL HIGH (ref 0.00–0.07)
Band Neutrophils: 27 %
Basophils Absolute: 0 10*3/uL (ref 0.0–0.1)
Basophils Relative: 0 %
Eosinophils Absolute: 0 10*3/uL (ref 0.0–0.5)
Eosinophils Relative: 0 %
HCT: 42.7 % (ref 39.0–52.0)
Hemoglobin: 14.3 g/dL (ref 13.0–17.0)
Lymphocytes Relative: 17 %
Lymphs Abs: 1.2 10*3/uL (ref 0.7–4.0)
MCH: 30.2 pg (ref 26.0–34.0)
MCHC: 33.5 g/dL (ref 30.0–36.0)
MCV: 90.1 fL (ref 80.0–100.0)
Metamyelocytes Relative: 8 %
Monocytes Absolute: 0 10*3/uL — ABNORMAL LOW (ref 0.1–1.0)
Monocytes Relative: 0 %
Neutro Abs: 5.3 10*3/uL (ref 1.7–7.7)
Neutrophils Relative %: 48 %
Platelets: 142 10*3/uL — ABNORMAL LOW (ref 150–400)
RBC: 4.74 MIL/uL (ref 4.22–5.81)
RDW: 12.6 % (ref 11.5–15.5)
WBC: 7 10*3/uL (ref 4.0–10.5)
nRBC: 0 % (ref 0.0–0.2)

## 2022-02-14 LAB — MAGNESIUM: Magnesium: 1.6 mg/dL — ABNORMAL LOW (ref 1.7–2.4)

## 2022-02-14 LAB — LACTIC ACID, PLASMA: Lactic Acid, Venous: 1.6 mmol/L (ref 0.5–1.9)

## 2022-02-14 LAB — PROCALCITONIN
Procalcitonin: 12.25 ng/mL
Procalcitonin: 18.18 ng/mL

## 2022-02-14 LAB — CORTISOL-AM, BLOOD: Cortisol - AM: 31 ug/dL — ABNORMAL HIGH (ref 6.7–22.6)

## 2022-02-14 LAB — PROTIME-INR
INR: 1.3 — ABNORMAL HIGH (ref 0.8–1.2)
Prothrombin Time: 15.7 seconds — ABNORMAL HIGH (ref 11.4–15.2)

## 2022-02-14 LAB — C-REACTIVE PROTEIN: CRP: 15.1 mg/dL — ABNORMAL HIGH (ref <1.0)

## 2022-02-14 MED ORDER — LOSARTAN POTASSIUM 50 MG PO TABS
25.0000 mg | ORAL_TABLET | Freq: Every day | ORAL | Status: DC
  Administered 2022-02-15 – 2022-02-16 (×2): 25 mg via ORAL
  Filled 2022-02-14 (×4): qty 1

## 2022-02-14 MED ORDER — ALBUTEROL SULFATE (2.5 MG/3ML) 0.083% IN NEBU
2.5000 mg | INHALATION_SOLUTION | RESPIRATORY_TRACT | Status: DC | PRN
  Administered 2022-02-14: 2.5 mg via RESPIRATORY_TRACT
  Filled 2022-02-14: qty 3

## 2022-02-14 MED ORDER — ONDANSETRON HCL 4 MG PO TABS: 4.0000 mg | ORAL_TABLET | Freq: Four times a day (QID) | ORAL | Status: DC | PRN

## 2022-02-14 MED ORDER — ZINC SULFATE 220 (50 ZN) MG PO CAPS
220.0000 mg | ORAL_CAPSULE | Freq: Every day | ORAL | Status: DC
  Administered 2022-02-14 – 2022-02-16 (×3): 220 mg via ORAL
  Filled 2022-02-14 (×3): qty 1

## 2022-02-14 MED ORDER — LOPERAMIDE HCL 2 MG PO CAPS
2.0000 mg | ORAL_CAPSULE | Freq: Once | ORAL | Status: AC
  Administered 2022-02-14: 2 mg via ORAL
  Filled 2022-02-14: qty 1

## 2022-02-14 MED ORDER — ASPIRIN 81 MG PO TBEC
81.0000 mg | DELAYED_RELEASE_TABLET | Freq: Every day | ORAL | Status: DC
  Administered 2022-02-14 – 2022-02-16 (×3): 81 mg via ORAL
  Filled 2022-02-14 (×3): qty 1

## 2022-02-14 MED ORDER — HYDROCODONE BIT-HOMATROP MBR 5-1.5 MG/5ML PO SOLN
5.0000 mL | Freq: Once | ORAL | Status: AC
  Administered 2022-02-14: 5 mL via ORAL
  Filled 2022-02-14: qty 5

## 2022-02-14 MED ORDER — IPRATROPIUM BROMIDE 0.02 % IN SOLN
0.5000 mg | Freq: Four times a day (QID) | RESPIRATORY_TRACT | Status: DC
  Administered 2022-02-14 – 2022-02-15 (×7): 0.5 mg via RESPIRATORY_TRACT
  Filled 2022-02-14 (×7): qty 2.5

## 2022-02-14 MED ORDER — DM-GUAIFENESIN ER 30-600 MG PO TB12
1.0000 | ORAL_TABLET | Freq: Two times a day (BID) | ORAL | Status: DC
  Administered 2022-02-14 – 2022-02-16 (×5): 1 via ORAL
  Filled 2022-02-14 (×5): qty 1

## 2022-02-14 MED ORDER — VITAMIN C 500 MG PO TABS
500.0000 mg | ORAL_TABLET | Freq: Every day | ORAL | Status: DC
  Administered 2022-02-14 – 2022-02-16 (×3): 500 mg via ORAL
  Filled 2022-02-14 (×3): qty 1

## 2022-02-14 MED ORDER — CHLORHEXIDINE GLUCONATE CLOTH 2 % EX PADS
6.0000 | MEDICATED_PAD | Freq: Every day | CUTANEOUS | Status: DC
  Administered 2022-02-14 – 2022-02-16 (×3): 6 via TOPICAL

## 2022-02-14 MED ORDER — ACETAMINOPHEN 650 MG RE SUPP: 650.0000 mg | Freq: Four times a day (QID) | RECTAL | Status: DC | PRN

## 2022-02-14 MED ORDER — CARVEDILOL 12.5 MG PO TABS
12.5000 mg | ORAL_TABLET | Freq: Two times a day (BID) | ORAL | Status: DC
  Administered 2022-02-14 – 2022-02-16 (×4): 12.5 mg via ORAL
  Filled 2022-02-14 (×4): qty 1

## 2022-02-14 MED ORDER — HEPARIN SODIUM (PORCINE) 5000 UNIT/ML IJ SOLN
5000.0000 [IU] | Freq: Three times a day (TID) | INTRAMUSCULAR | Status: DC
  Administered 2022-02-14 – 2022-02-16 (×7): 5000 [IU] via SUBCUTANEOUS
  Filled 2022-02-14 (×7): qty 1

## 2022-02-14 MED ORDER — DIPHENHYDRAMINE HCL 25 MG PO CAPS: 25.0000 mg | ORAL_CAPSULE | Freq: Every evening | ORAL | Status: DC | PRN

## 2022-02-14 MED ORDER — ATORVASTATIN CALCIUM 20 MG PO TABS
20.0000 mg | ORAL_TABLET | Freq: Every day | ORAL | Status: DC
  Administered 2022-02-14 – 2022-02-16 (×3): 20 mg via ORAL
  Filled 2022-02-14: qty 1
  Filled 2022-02-14: qty 2
  Filled 2022-02-14: qty 1

## 2022-02-14 MED ORDER — ONDANSETRON HCL 4 MG/2ML IJ SOLN: 4.0000 mg | Freq: Four times a day (QID) | INTRAMUSCULAR | Status: DC | PRN

## 2022-02-14 MED ORDER — DIPHENHYDRAMINE-APAP (SLEEP) 25-500 MG PO TABS: 1.0000 | ORAL_TABLET | Freq: Every evening | ORAL | Status: DC | PRN

## 2022-02-14 MED ORDER — ACETAMINOPHEN 325 MG PO TABS
650.0000 mg | ORAL_TABLET | Freq: Four times a day (QID) | ORAL | Status: DC | PRN
  Administered 2022-02-14: 650 mg via ORAL
  Filled 2022-02-14: qty 2

## 2022-02-14 MED ORDER — SPIRONOLACTONE 25 MG PO TABS
25.0000 mg | ORAL_TABLET | Freq: Every day | ORAL | Status: DC
  Administered 2022-02-14 – 2022-02-16 (×3): 25 mg via ORAL
  Filled 2022-02-14 (×2): qty 1

## 2022-02-14 MED ORDER — HEPARIN SODIUM (PORCINE) 5000 UNIT/ML IJ SOLN: 5000.0000 [IU] | Freq: Three times a day (TID) | INTRAMUSCULAR | Status: DC

## 2022-02-14 MED ORDER — GUAIFENESIN ER 600 MG PO TB12
600.0000 mg | ORAL_TABLET | Freq: Two times a day (BID) | ORAL | Status: DC
  Administered 2022-02-14: 600 mg via ORAL
  Filled 2022-02-14: qty 1

## 2022-02-14 MED ORDER — ACETAMINOPHEN 500 MG PO TABS: 500.0000 mg | ORAL_TABLET | Freq: Every evening | ORAL | Status: DC | PRN

## 2022-02-14 MED ORDER — OXYCODONE HCL 5 MG PO TABS: 5.0000 mg | ORAL_TABLET | ORAL | Status: DC | PRN

## 2022-02-14 NOTE — Progress Notes (Signed)
Patient seen and examined; admitted after midnight secondary to shortness of breath and productive coughing spells.  Patient found to have sepsis secondary to pneumonia and COVID infection.  Currently with a stable vital signs and requiring 3 L nasal cannula supplementation.  Please refer to H&P written by Dr.[email or web address]-ghosh on 02/14/22 for further info/details on admission.  Plan: -Continue the use of a steroid and barcitinib -Follow inflammatory markers -Flutter valve, Mucinex and as needed bronchodilators will be provided. -Continue IV antibiotics, supportive care and follow clinical response.  Christopher Booth [phone]

## 2022-02-14 NOTE — ED Notes (Signed)
Pt was given breakfast tray. 

## 2022-02-14 NOTE — Assessment & Plan Note (Addendum)
-  Follow final results of cultures; at time of discharge no growth appreciated. -Continue as needed bronchodilators, flutter valve/incentive spirometer and the use of mucolytic's. -Patient discharged with 6 more days of oral antibiotics to complete treatment. -No oxygen supplementation required demonstrating good saturation on room air.

## 2022-02-14 NOTE — H&P (Signed)
History and Physical    Patient: Christopher Booth GUR:[id number] DOB: 1940-10-12 DOA: 02/13/2022 DOS: the patient was seen and examined on 02/14/2022 PCP: Susy Frizzle, MD  Patient coming from: Home  Chief Complaint:  Chief Complaint  Patient presents with   Cough   HPI: Christopher Booth is a 82 y.o. male with medical history significant of coronary artery disease, essential hypertension, hyperaldosteronism, hypertension, myocardial infarction, TMJ, and more presents the ED with a chief complaint of cough.  Patient reports a cough started last week.  He has had a progressively increasing temperature as well.  His cough is productive of thick sputum.  He has no shortness of breath, myalgias, headache, or chest pain.  He reports that his Tmax was 101.5 when he was here.  He has been taking Tylenol, but he does not think he took it before he came into the ER.  He had 1 episode of nausea and vomiting at home that was nonbloody.  He started having diarrhea on the day of presentation and has continued in the ER.  He had 5 episodes of diarrhea in the ER before Flexi-Seal was placed.  He has no abdominal pain.  Patient reports diarrhea is nonbloody.  He does not wear O2 at home and has been requiring 2 L nasal cannula to maintain his oxygen sats in the ER.  When taken off to 2 L nasal cannula he dropped down to 90%.  Patient is very concerned that his wife is at home alone.  She is a stroke survivor and requires involved care, and this is a patient very stressed.  He has no other medical complaints at this time.  Patient does not smoke, does not drink, does not use illicit drugs.  He is vaccinated for COVID and flu.  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 Review of Systems: As mentioned in the history of present illness. All other systems reviewed and are negative. Past Medical History:  Diagnosis Date   Adrenal mass ([place])    Right, benign Conn's  Syndrome   Arthritis    CAD (coronary artery disease)    Multivessel/left main status post CABG 12/2016   Essential hypertension    Gynecomastia    Hyperaldosteronism    Hypertension    Lumbar radiculopathy    Myocardial infarction [facility])    Pneumonia    Postoperative atrial fibrillation ([place])    Pre-diabetes    Spinal stenosis    Temporomandibular joint disease    Wears glasses    Past Surgical History:  Procedure Laterality Date   ADRENAL GLAND SURGERY  10/17/2019   CATARACT EXTRACTION W/PHACO Right 01/28/2015   Procedure: CATARACT EXTRACTION PHACO AND INTRAOCULAR LENS PLACEMENT RIGHT EYE;  Surgeon: Williams Che, MD;  Location: [facility];  Service: Ophthalmology;  Laterality: Right;  CDE:27.23   CATARACT EXTRACTION W/PHACO Left 04/14/2015   Procedure: CATARACT EXTRACTION PHACO AND INTRAOCULAR LENS PLACEMENT LEFT EYE CDE=15.86;  Surgeon: Williams Che, MD;  Location: [facility];  Service: Ophthalmology;  Laterality: Left;   [place]   COLONOSCOPY     CORONARY ARTERY BYPASS GRAFT N/A 01/12/2017   Procedure: CORONARY ARTERY BYPASS GRAFTING (CABG);  Surgeon: Grace Isaac, MD;  Location: [place];  Service: Open Heart Surgery;  Laterality: N/A;  Times 3 using left internal mammary artery to LAD and endoscopically harvested right thigh saphenous vein to OM and PD  EYE SURGERY N/A    Phreesia 07/21/2019   KNEE ARTHROSCOPY Right 10/09/2013   Procedure: RIGHT ARTHROSCOPY KNEE Partial medial and lateral meniscal tear and chondroplasty;  Surgeon: Hessie Dibble, MD;  Location: [place];  Service: Orthopedics;  Laterality: Right;  Partial medial and lateral meniscal tear and chondroplasty    LEFT HEART CATH AND CORONARY ANGIOGRAPHY N/A 01/11/2017   Procedure: LEFT HEART CATH AND CORONARY ANGIOGRAPHY;  Surgeon: Jettie Booze, MD;  Location: [place] [facility];  Service: Cardiovascular;  Laterality: N/A;   LUMBAR  LAMINECTOMY/DECOMPRESSION MICRODISCECTOMY Right 09/20/2019   Procedure: Right Lumbar four- Lumbar five Lumbar five Sacral one Laminectomy/Foraminotomy;  Surgeon: Kristeen Miss, MD;  Location: [place];  Service: Neurosurgery;  Laterality: Right;   ROBOTIC ADRENALECTOMY Right 10/17/2019   Procedure: XI ROBOTIC ADRENALECTOMY;  Surgeon: Ralene Ok, MD;  Location: [place];  Service: General;  Laterality: Right;   TEE WITHOUT CARDIOVERSION N/A 01/12/2017   Procedure: TRANSESOPHAGEAL ECHOCARDIOGRAM (TEE);  Surgeon: Grace Isaac, MD;  Location: [place];  Service: Open Heart Surgery;  Laterality: N/A;   TONSILLECTOMY     VASECTOMY N/A    Phreesia 07/21/2019   Social History:  reports that he has never smoked. He has never been exposed to tobacco smoke. He has never used smokeless tobacco. He reports current alcohol use. He reports that he does not use drugs.  Allergies  Allergen Reactions   Diovan [Valsartan]     unknown    Family History  Problem Relation Age of Onset   Hypertension Other    Cancer Brother        Colon   Cancer Brother        Melanoma    Prior to Admission medications   Medication Sig Start Date End Date Taking? Authorizing Provider  aspirin 81 MG EC tablet Take 81 mg by mouth daily.    [provider]  atorvastatin (LIPITOR) 20 MG tablet TAKE ONE TABLET BY MOUTH ONCE DAILY. 08/17/21   Susy Frizzle, MD  carvedilol (COREG) 12.5 MG tablet Take 12.5 mg by mouth in the morning and at bedtime.    [provider]  diphenhydramine-acetaminophen (TYLENOL PM) 25-500 MG TABS tablet Take 1 tablet by mouth at bedtime as needed.    [provider]  eplerenone (INSPRA) 25 MG tablet Take 50 mg by mouth 2 (two) times daily. 03/31/21   [provider]  losartan (COZAAR) 25 MG tablet Take 1 tablet (25 mg total) by mouth daily. 09/30/20   Susy Frizzle, MD  Multiple Vitamin (MULTIVITAMIN WITH MINERALS) TABS tablet Take 1 tablet by mouth daily.     [provider]  potassium chloride SA (KLOR-CON) 20 MEQ tablet Take 40 mEq by mouth daily. 09/21/19   [provider]  telmisartan-hydrochlorothiazide (MICARDIS HCT) 80-12.5 MG per tablet Take 1 tablet by mouth daily.  04/13/11  [provider]    Physical Exam: Vitals:   02/14/22 0125 02/14/22 0322 02/14/22 0330 02/14/22 0430  BP:   118/71 111/87  Pulse:   83 86  Resp:   (!) 25 (!) 26  Temp:  99 F (37.2 C)    TempSrc:  Oral    SpO2: 95%  92% 93%  Weight:      Height:       1.  General: Patient lying supine in bed,  no acute distress   2. Psychiatric: Alert and oriented x 3, mood and behavior normal for situation, pleasant and cooperative  with exam   3. Neurologic: Speech and language are normal, face is symmetric, moves all 4 extremities voluntarily, at baseline without acute deficits on limited exam   4. HEENMT:  Head is atraumatic, normocephalic, pupils reactive to light, neck is supple, trachea is midline, mucous membranes are moist   5. Respiratory : Bilateral rhonchi without wheezing, rales, no cyanosis, no increase in work of breathing or accessory muscle use, maintaining oxygen sats on 2 L nasal cannula   6. Cardiovascular : Heart rate normal, rhythm is regular, no murmurs, rubs or gallops, no peripheral edema, peripheral pulses palpated   7. Gastrointestinal:  Abdomen is soft, nondistended, nontender to palpation bowel sounds active, no masses or organomegaly palpated, Flexi-Seal output of dark brown liquid stool   8. Skin:  Skin is warm, dry and intact without rashes, acute lesions, or ulcers on limited exam   9.Musculoskeletal:  No acute deformities or trauma, no asymmetry in tone, no peripheral edema, peripheral pulses palpated, no tenderness to palpation in the extremities  Data Reviewed: In the ED Temp 101.5, heart rate 96-99, respiratory rate 18-26, blood pressure 117/60-127/79, satting 90-96% in the ER No leukocytosis with  white blood cell count of 10.8, hemoglobin 13.3, platelets 159 History reveals a hypokalemia at 2.9, decreased bicarb at 20 Initial lactic acid is 2.2 within normal x 2 after that Blood cultures pending CTA abdomen and chest shows no PE, bibasilar infiltrates, diarrheal state, fatty liver Rocephin, Zithromax, LR 1.5 L, LR 150 mL/L infusion, Zofran 4 mg, potassium 60 mEq given in the ED Admission requested for further management of acute respiratory failure with hypoxia, sepsis, pneumonia  Assessment and Plan: * Sepsis ([place]) - Temp 101.5, heart rate 99, respiratory rate 26, with acute respiratory failure, lactic acidosis - CT chest shows bibasilar infiltrates - COVID-positive as well - UA is not indicative of UTI - Blood cultures pending - Urine culture pending - Sputum culture pending - Started on Rocephin and Zithromax for bacterial pneumonia>> continue-procalcitonin is 12.25 - COVID-positive and with hypoxic respiratory failure starting Solu-Medrol and baricitinib - Continue to monitor  Acute respiratory failure with hypoxia ([place]) - Secondary to COVID virus infection and likely bacterial pneumonia - Wean off O2 as tolerated - Albuterol as needed - Continue to monitor  COVID-19 virus infection - Continue albuterol as needed - Continue baricitinib - Continue Solu-Medrol - Airborne precautions - Mucinex and Hycodan for supportive care - Continue to monitor  CAP (community acquired pneumonia) - Bilateral infiltrates on imaging - Procalcitonin 12.25 - Blood culture pending - Sputum culture pending - Urine Legionella and strep antigens - Continue Rocephin and Zithromax - Continue to monitor  Hypokalemia - Potassium down to 2.9 - Secondary to poor p.o. intake and diarrhea - 60 mEq potassium given in the ED - Trend with a.m. labs  Mixed hyperlipidemia - Continue statin  Essential hypertension, benign - Continue losartan and formulary substitute spironolactone for  eplerenone      Advance Care Planning:   Code Status: Full Code  Consults: None at this time  Family Communication: No family at bedside  Severity of Illness: The appropriate patient status for this patient is INPATIENT. Inpatient status is judged to be reasonable and necessary in order to provide the required intensity of service to ensure the patient's safety. The patient's presenting symptoms, physical exam findings, and initial radiographic and laboratory data in the context of their chronic comorbidities is felt to place them at high risk for further clinical deterioration. Furthermore, it is  not anticipated that the patient will be medically stable for discharge from the hospital within 2 midnights of admission.   * I certify that at the point of admission it is my clinical judgment that the patient will require inpatient hospital care spanning beyond 2 midnights from the point of admission due to high intensity of service, high risk for further deterioration and high frequency of surveillance required.*  Author: Rolla Plate, DO 02/14/2022 6:09 AM  For on call review [email or web address].

## 2022-02-14 NOTE — Assessment & Plan Note (Addendum)
-  Patient met sepsis criteria at time of admission with: Temp 101.5, heart rate 99, respiratory rate 26, with acute respiratory failure and the presence of lactic acidosis. - CT chest shows bibasilar infiltrates he was found to be positive for COVID. - Elevated WBCs, elevated procalcitonin and CRP suggesting the presence of concomitant bacterial infection. - UA is not indicative of UTI. - Patient lactic acid also within normal limits and specific to resolve at time of discharge; WBCs back to normal range. -Patient will complete treatment with oral antibiotics for 6 more days along with a steroid tapering as part of treatment for COVID.  He had 3 days of baricitinib while inpatient.   -No oxygen supplementation required.   -Advised to continue the use of flutter valve, to maintain adequate hydration, to continue antitussive/mucolytic's, vitamin C and the use of zinc.

## 2022-02-14 NOTE — Assessment & Plan Note (Addendum)
-  Continue statin. -Heart healthy diet discussed with patient.

## 2022-02-14 NOTE — Progress Notes (Signed)
  Transition of Care [facility]) Screening Note   Patient Details  Name: CHEZ BULNES Date of Birth: 1940-03-04   Transition of Care [facility]) CM/SW Contact:    Iona Beard, [place] Phone Number: 02/14/2022, 10:31 AM  CSW updated by MD that pt is caregiver for his wife who is currently at pts home. CSW spoke with pts daughter who states pts neighbors are checking in on pts wife. Daughter states she will be bringing pt his phone and charger today. She will also be checking on pts wife and getting groceries for the home. CSW provided her with [facility] number as she inquired about in home care giving services for her step mother.   Transition of Care Department [facility]) has reviewed patient and no TOC needs have been identified at this time. We will continue to monitor patient advancement through interdisciplinary progression rounds. If new patient transition needs arise, please place a TOC consult.

## 2022-02-14 NOTE — Assessment & Plan Note (Addendum)
-  As mentioned on sepsis problem description. -Patient completed 3 days of breath dyspnea; discharged home on a steroid taper, vitamin C, zinc, bronchodilator management, instructions to continue using flutter valve/incentive spirometer and to continue as needed mucolytic's/antitussive medications.   -No oxygen supplementation required at discharge.

## 2022-02-14 NOTE — Assessment & Plan Note (Addendum)
-  Secondary to COVID infection and bacterial pneumonia. -Improved/resolved -At discharge no oxygen supplementation required.

## 2022-02-14 NOTE — Assessment & Plan Note (Addendum)
-  Repleted and and daily supplementation has been resumed at time of discharge -Magnesium level was found to also be low and was also repleted. -Repeat basic metabolic panel and check magnesium level at follow-up visit to assess stability.

## 2022-02-14 NOTE — Assessment & Plan Note (Addendum)
-  Continue current antihypertensive agents -Heart healthy diet discussed with patient -Patient advised to maintain adequate hydration.

## 2022-02-15 DIAGNOSIS — A419 Sepsis, unspecified organism: Secondary | ICD-10-CM | POA: Diagnosis not present

## 2022-02-15 DIAGNOSIS — J9601 Acute respiratory failure with hypoxia: Secondary | ICD-10-CM | POA: Diagnosis not present

## 2022-02-15 DIAGNOSIS — J189 Pneumonia, unspecified organism: Secondary | ICD-10-CM | POA: Diagnosis not present

## 2022-02-15 DIAGNOSIS — E782 Mixed hyperlipidemia: Secondary | ICD-10-CM | POA: Diagnosis not present

## 2022-02-15 LAB — PROCALCITONIN: Procalcitonin: 15.54 ng/mL

## 2022-02-15 LAB — CBC
HCT: 38.3 % — ABNORMAL LOW (ref 39.0–52.0)
Hemoglobin: 13.1 g/dL (ref 13.0–17.0)
MCH: 30.3 pg (ref 26.0–34.0)
MCHC: 34.2 g/dL (ref 30.0–36.0)
MCV: 88.5 fL (ref 80.0–100.0)
Platelets: 145 10*3/uL — ABNORMAL LOW (ref 150–400)
RBC: 4.33 MIL/uL (ref 4.22–5.81)
RDW: 12.4 % (ref 11.5–15.5)
WBC: 10.1 10*3/uL (ref 4.0–10.5)
nRBC: 0 % (ref 0.0–0.2)

## 2022-02-15 LAB — MRSA NEXT GEN BY PCR, NASAL: MRSA by PCR Next Gen: NOT DETECTED

## 2022-02-15 LAB — URINE CULTURE: Culture: NO GROWTH

## 2022-02-15 MED ORDER — IPRATROPIUM BROMIDE 0.02 % IN SOLN
0.5000 mg | Freq: Two times a day (BID) | RESPIRATORY_TRACT | Status: DC
  Administered 2022-02-16: 0.5 mg via RESPIRATORY_TRACT
  Filled 2022-02-15: qty 2.5

## 2022-02-15 NOTE — Progress Notes (Signed)
Progress Note   Patient: Christopher Booth JFH:[id number] DOB: Jan 11, 1941 DOA: 02/13/2022     2 DOS: the patient was seen and examined on 02/15/2022   Brief hospital course: GIROLAMO LORTIE is a 82 y.o. male with medical history significant of coronary artery disease, essential hypertension, hyperaldosteronism, hypertension, myocardial infarction, TMJ, and more presents the ED with a chief complaint of cough.  Patient reports a cough started last week.  He has had a progressively increasing temperature as well.  His cough is productive of thick sputum.  He has no shortness of breath, myalgias, headache, or chest pain.  He reports that his Tmax was 101.5 when he was here.  He has been taking Tylenol, but he does not think he took it before he came into the ER.  He had 1 episode of nausea and vomiting at home that was nonbloody.  He started having diarrhea on the day of presentation and has continued in the ER.  He had 5 episodes of diarrhea in the ER before Flexi-Seal was placed.  He has no abdominal pain.  Patient reports diarrhea is nonbloody.  He does not wear O2 at home and has been requiring 2 L nasal cannula to maintain his oxygen sats in the ER.  When taken off to 2 L nasal cannula he dropped down to 90%.  Patient is very concerned that his wife is at home alone.  She is a stroke survivor and requires involved care, and this is a patient very stressed.  He has no other medical complaints at this time.   Patient does not smoke, does not drink, does not use illicit drugs.  He is vaccinated for COVID and flu.  Assessment and Plan: * Sepsis ([place]) - Patient met sepsis criteria at time of admission with: Temp 101.5, heart rate 99, respiratory rate 26, with acute respiratory failure and the presence of lactic acidosis. - CT chest shows bibasilar infiltrates - Patient found to be COVID-positive and with elevated procalcitonin/CRP. - UA is not indicative of UTI. - Continue to maintain adequate hydration, wean  off oxygen supplementation as tolerated, continue IV antibiotics and the use of baricitinib for COVID.  Continue the use of his steroids, as needed bronchodilators, vitamin C, zinc and the use of flutter valve.   -Follow cultures results and clinical response. -Sepsis features resolving adequately.  Acute respiratory failure with hypoxia ([facility]) - Secondary to COVID infection and bacterial pneumonia. -Continue treatment as mentioned above -Wean off oxygen supplementation as tolerated -Follow-up clinical response.  COVID-19 virus infection - As mentioned sepsis probably -Continue baricitinib, steroids, vitamin C, zinc, bronchodilator management, flutter valve, mucolytic's and antitussive medication -Continue to wean off oxygen supplementation as tolerated.  CAP (community acquired pneumonia) - Continue to follow results of pending cultures -Continue as needed bronchodilators -Continue current antibiotic -Follow clinical response and wean off oxygen supplementation as tolerated.  Hypokalemia - Repleted and potassium of 3.8 -Continue to follow electrolytes and further replete as needed -Most likely associated with GI losses. -Magnesium was also low; will further replete as needed.  Mixed hyperlipidemia - Continue statin. -Heart healthy diet discussed with patient.  Essential hypertension, benign - Continue current antihypertensive agents -Heart healthy diet discussed with patient -Maintain adequate hydration.   Subjective:  Reports feeling better, still complaining of intermittent coughing spells and is requiring 2 L nasal cannula supplementation.  No fever.  Physical Exam: Vitals:   02/15/22 0400 02/15/22 0730 02/15/22 1121 02/15/22 1442  BP: (!) 167/96  Pulse: 74 77 66   Resp: 18 (!) 24 (!) 21   Temp:  98.6 F (37 C) 98.8 F (37.1 C)   TempSrc:  Oral Oral   SpO2: 97% 98% 98% 97%  Weight:      Height:       General exam: Alert, awake, oriented x 3; adequately  improving; no chest pain, no nausea, no vomiting.  Afebrile. Respiratory system: Positive rhonchi bilaterally; intermittent tachypnea short winded with activity.  2 L of hospitalization in place.  No crackles. Cardiovascular system:RRR.  No rubs or gallops; no JVD. Gastrointestinal system: Abdomen is nondistended, soft and nontender. No organomegaly or masses felt. Normal bowel sounds heard. Central nervous system: Alert and oriented. No focal neurological deficits. Extremities: No cyanosis or clubbing; no edema. Skin: No petechiae. Psychiatry: Judgement and insight appear normal. Mood & affect appropriate.    Data Reviewed: Procalcitonin: 15.54 CBC: WBCs 10.1, hemoglobin 13.1, platelet count 145 K MRSA PCR: Negative Comprehensive metabolic panel: Sodium 497, potassium 3.3, chloride 99, bicarb 21, BUN 17, creatinine 0.97, AST 50, ALT 40 and GFR more than 60.  Alkaline phosphatase 52.  Family Communication: No family at bedside.  Daughter updated on 02/14/2022  Disposition: Status is: Inpatient Remains inpatient appropriate because: Continue treatment with IV antibiotics, steroids and baricitinib.   Planned Discharge Destination: Home   Time spent: 50 minutes  Author: Barton Dubois, MD 02/15/2022 3:51 PM  For on call review [email or web address].

## 2022-02-15 NOTE — Progress Notes (Signed)
Alert and oriented in chair.  Assisted to bed and rectal tube removed.  Assisted to bedside commode .  Call light in reach

## 2022-02-16 DIAGNOSIS — A419 Sepsis, unspecified organism: Secondary | ICD-10-CM | POA: Diagnosis not present

## 2022-02-16 DIAGNOSIS — K219 Gastro-esophageal reflux disease without esophagitis: Secondary | ICD-10-CM

## 2022-02-16 DIAGNOSIS — I10 Essential (primary) hypertension: Secondary | ICD-10-CM | POA: Diagnosis not present

## 2022-02-16 DIAGNOSIS — E876 Hypokalemia: Secondary | ICD-10-CM | POA: Diagnosis not present

## 2022-02-16 DIAGNOSIS — J189 Pneumonia, unspecified organism: Secondary | ICD-10-CM | POA: Diagnosis not present

## 2022-02-16 LAB — CBC
HCT: 36 % — ABNORMAL LOW (ref 39.0–52.0)
Hemoglobin: 12.6 g/dL — ABNORMAL LOW (ref 13.0–17.0)
MCH: 30.4 pg (ref 26.0–34.0)
MCHC: 35 g/dL (ref 30.0–36.0)
MCV: 87 fL (ref 80.0–100.0)
Platelets: 151 10*3/uL (ref 150–400)
RBC: 4.14 MIL/uL — ABNORMAL LOW (ref 4.22–5.81)
RDW: 12.4 % (ref 11.5–15.5)
WBC: 9.3 10*3/uL (ref 4.0–10.5)
nRBC: 0 % (ref 0.0–0.2)

## 2022-02-16 LAB — BASIC METABOLIC PANEL
Anion gap: 7 (ref 5–15)
BUN: 23 mg/dL (ref 8–23)
CO2: 25 mmol/L (ref 22–32)
Calcium: 7.7 mg/dL — ABNORMAL LOW (ref 8.9–10.3)
Chloride: 104 mmol/L (ref 98–111)
Creatinine, Ser: 0.8 mg/dL (ref 0.61–1.24)
GFR, Estimated: >60 mL/min (ref >60)
Glucose, Bld: 171 mg/dL — ABNORMAL HIGH (ref 70–99)
Potassium: 3.1 mmol/L — ABNORMAL LOW (ref 3.5–5.1)
Sodium: 136 mmol/L (ref 135–145)

## 2022-02-16 MED ORDER — PREDNISONE 20 MG PO TABS: ORAL_TABLET | ORAL | 0 refills | Status: DC

## 2022-02-16 MED ORDER — DM-GUAIFENESIN ER 30-600 MG PO TB12: 1.0000 | ORAL_TABLET | Freq: Two times a day (BID) | ORAL | 0 refills | Status: DC

## 2022-02-16 MED ORDER — ASCORBIC ACID 500 MG PO TABS: 500.0000 mg | ORAL_TABLET | Freq: Every day | ORAL | 1 refills | Status: AC

## 2022-02-16 MED ORDER — DESITIN 40 % EX PSTE: PASTE | CUTANEOUS | 0 refills | Status: AC

## 2022-02-16 MED ORDER — ALBUTEROL SULFATE HFA 108 (90 BASE) MCG/ACT IN AERS: 2.0000 | INHALATION_SPRAY | Freq: Four times a day (QID) | RESPIRATORY_TRACT | 2 refills | Status: AC | PRN

## 2022-02-16 MED ORDER — AMOXICILLIN-POT CLAVULANATE 875-125 MG PO TABS: 1.0000 | ORAL_TABLET | Freq: Two times a day (BID) | ORAL | 0 refills | Status: DC

## 2022-02-16 MED ORDER — ZINC SULFATE 220 (50 ZN) MG PO CAPS: 220.0000 mg | ORAL_CAPSULE | Freq: Every day | ORAL | 1 refills | Status: AC

## 2022-02-16 MED ORDER — PANTOPRAZOLE SODIUM 40 MG PO TBEC: 40.0000 mg | DELAYED_RELEASE_TABLET | Freq: Every day | ORAL | 1 refills | Status: DC

## 2022-02-16 MED ORDER — POTASSIUM CHLORIDE CRYS ER 20 MEQ PO TBCR
40.0000 meq | EXTENDED_RELEASE_TABLET | ORAL | Status: DC
  Administered 2022-02-16 (×2): 40 meq via ORAL
  Filled 2022-02-16 (×2): qty 2

## 2022-02-16 NOTE — Care Management Important Message (Signed)
Important Message  Patient Details  Name: Christopher Booth MRN: [id number] Date of Birth: [birth year]   Medicare Important Message Given:  N/A - LOS <3 / Initial given by admissions     Tommy Medal 02/16/2022, 10:13 AM

## 2022-02-16 NOTE — Discharge Summary (Signed)
Physician Discharge Summary   Patient: Christopher Booth MRN: [id number] DOB: 1940/05/08  Admit date:     02/13/2022  Discharge date: 02/16/22  Discharge Physician: Barton Dubois   PCP: Susy Frizzle, MD   Recommendations at discharge:  Repeat basic metabolic panel to follow electrolytes and renal function Repeat chest x-ray in 6-8 weeks to assure complete resolution of infiltrate. Reassess blood pressure and adjust antihypertensive regimen as needed.    Discharge Diagnoses: Principal Problem:   Sepsis ([place]) Active Problems:   Essential hypertension, benign   Mixed hyperlipidemia   Hypokalemia   Pneumonia of left lower lobe due to infectious organism   COVID-19 virus infection   Acute respiratory failure with hypoxia (HCC)   Gastroesophageal reflux disease    Hospital Course: Christopher Booth is a 82 y.o. male with medical history significant of coronary artery disease, essential hypertension, hyperaldosteronism, hypertension, myocardial infarction, TMJ, and more presents the ED with a chief complaint of cough.  Patient reports a cough started last week.  He has had a progressively increasing temperature as well.  His cough is productive of thick sputum.  He has no shortness of breath, myalgias, headache, or chest pain.  He reports that his Tmax was 101.5 when he was here.  He has been taking Tylenol, but he does not think he took it before he came into the ER.  He had 1 episode of nausea and vomiting at home that was nonbloody.  He started having diarrhea on the day of presentation and has continued in the ER.  He had 5 episodes of diarrhea in the ER before Flexi-Seal was placed.  He has no abdominal pain.  Patient reports diarrhea is nonbloody.  He does not wear O2 at home and has been requiring 2 L nasal cannula to maintain his oxygen sats in the ER.  When taken off to 2 L nasal cannula he dropped down to 90%.  Patient is very concerned that his wife is at home alone.  She is a stroke  survivor and requires involved care, and this is a patient very stressed.  He has no other medical complaints at this time.   Patient does not smoke, does not drink, does not use illicit drugs.  He is vaccinated for COVID and flu.  Assessment and Plan: * Sepsis ([place]) -Patient met sepsis criteria at time of admission with: Temp 101.5, heart rate 99, respiratory rate 26, with acute respiratory failure and the presence of lactic acidosis. - CT chest shows bibasilar infiltrates he was found to be positive for COVID. - Elevated WBCs, elevated procalcitonin and CRP suggesting the presence of concomitant bacterial infection. - UA is not indicative of UTI. - Patient lactic acid also within normal limits and specific to resolve at time of discharge; WBCs back to normal range. -Patient will complete treatment with oral antibiotics for 6 more days along with a steroid tapering as part of treatment for COVID.  He had 3 days of baricitinib while inpatient.   -No oxygen supplementation required.   -Advised to continue the use of flutter valve, to maintain adequate hydration, to continue antitussive/mucolytic's, vitamin C and the use of zinc.  Gastroesophageal reflux disease -Protonix daily initiated. -this will also provide GI prophylaxis while using prednisone.  Acute respiratory failure with hypoxia (HCC) - Secondary to COVID infection and bacterial pneumonia. -Improved/resolved -At discharge no oxygen supplementation required.  COVID-19 virus infection - As mentioned on sepsis problem description. -Patient completed 3 days of breath dyspnea;  discharged home on a steroid taper, vitamin C, zinc, bronchodilator management, instructions to continue using flutter valve/incentive spirometer and to continue as needed mucolytic's/antitussive medications.   -No oxygen supplementation required at discharge.  Pneumonia of left lower lobe due to infectious organism - Follow final results of cultures; at time  of discharge no growth appreciated. -Continue as needed bronchodilators, flutter valve/incentive spirometer and the use of mucolytic's. -Patient discharged with 6 more days of oral antibiotics to complete treatment. -No oxygen supplementation required demonstrating good saturation on room air.  Hypokalemia - Repleted and and daily supplementation has been resumed at time of discharge -Magnesium level was found to also be low and was also repleted. -Repeat basic metabolic panel and check magnesium level at follow-up visit to assess stability.  Mixed hyperlipidemia - Continue statin. -Heart healthy diet discussed with patient.  Essential hypertension, benign - Continue current antihypertensive agents -Heart healthy diet discussed with patient -Patient advised to maintain adequate hydration.   Consultants: None Procedures performed: See below for x-ray reports. Disposition: Home Diet recommendation: Heart healthy diet.  DISCHARGE MEDICATION: Allergies as of 02/16/2022       Reactions   Diovan [valsartan] Other (See Comments)   Unknown reaction        Medication List     TAKE these medications    albuterol 108 (90 Base) MCG/ACT inhaler Commonly known as: VENTOLIN HFA Inhale 2 puffs into the lungs every 6 (six) hours as needed for wheezing or shortness of breath.   amoxicillin-clavulanate 875-125 MG tablet Commonly known as: AUGMENTIN Take 1 tablet by mouth 2 (two) times daily for 6 days.   ascorbic acid 500 MG tablet Commonly known as: VITAMIN C Take 1 tablet (500 mg total) by mouth daily. Start taking on: February 17, 2022   aspirin EC 81 MG tablet Take 81 mg by mouth daily.   atorvastatin 20 MG tablet Commonly known as: LIPITOR TAKE ONE TABLET BY MOUTH ONCE DAILY. What changed: when to take this   carvedilol 12.5 MG tablet Commonly known as: COREG Take 12.5 mg by mouth in the morning and at bedtime.   Desitin 40 % Pste Generic drug: Zinc Oxide Apply 3  times a day on affected area to assist with skin protection and healing.   dextromethorphan-guaiFENesin 30-600 MG 12hr tablet Commonly known as: MUCINEX DM Take 1 tablet by mouth 2 (two) times daily for 7 days.   eplerenone 25 MG tablet Commonly known as: INSPRA Take 50 mg by mouth 2 (two) times daily.   losartan 25 MG tablet Commonly known as: Cozaar Take 1 tablet (25 mg total) by mouth daily.   multivitamin with minerals Tabs tablet Take 1 tablet by mouth daily.   pantoprazole 40 MG tablet Commonly known as: Protonix Take 1 tablet (40 mg total) by mouth daily.   potassium chloride SA 20 MEQ tablet Commonly known as: KLOR-CON M Take 40 mEq by mouth 2 (two) times daily.   predniSONE 20 MG tablet Commonly known as: DELTASONE Take 3 tablets by mouth daily x 1 day; then 2 tablets by mouth daily x 2 days; then 1 tablet by mouth daily x 3 days; then half tablet by mouth daily x 3 days and stop prednisone.   zinc sulfate 220 (50 Zn) MG capsule Take 1 capsule (220 mg total) by mouth daily. Start taking on: February 17, 2022        Follow-up Information     Pickard, Cammie Mcgee, MD. Schedule an appointment as soon as  possible for a visit in 10 day(s).   Specialty: Family Medicine Contact information: [address] [place] [address] [phone]                Discharge Exam: Danley Danker Weights   02/13/22 2043 02/14/22 1843  Weight: 79.7 kg 83.2 kg   General exam: Alert, awake, oriented x 3; in no acute distress, still reporting intermittent productive coughing spells but not requiring oxygen supplementation and feeling ready to go home.  Patient is afebrile and in no acute distress. Respiratory system: Improved air movement bilaterally; no wheezing, no crackles, positive rhonchi appreciated.  No using accessory muscle. Cardiovascular system:RRR. No rubs, gallops or JVD. Gastrointestinal system: Abdomen is nondistended, soft and nontender. No organomegaly or  masses felt. Normal bowel sounds heard. Central nervous system: Alert and oriented. No focal neurological deficits. Extremities: No cyanosis or clubbing. Skin: No petechiae. Psychiatry: Judgement and insight appear normal. Mood & affect appropriate.   Condition at discharge: stable and Improved.  The results of significant diagnostics from this hospitalization (including imaging, microbiology, ancillary and laboratory) are listed below for reference.   Imaging Studies: CT Angio Chest PE W and/or Wo Contrast  Result Date: 02/13/2022 CLINICAL DATA:  Abdominal pain.  Concern for pulmonary embolism. EXAM: CT ANGIOGRAPHY CHEST CT ABDOMEN AND PELVIS WITH CONTRAST TECHNIQUE: Multidetector CT imaging of the chest was performed using the standard protocol during bolus administration of intravenous contrast. Multiplanar CT image reconstructions and MIPs were obtained to evaluate the vascular anatomy. Multidetector CT imaging of the abdomen and pelvis was performed using the standard protocol during bolus administration of intravenous contrast. RADIATION DOSE REDUCTION: This exam was performed according to the departmental dose-optimization program which includes automated exposure control, adjustment of the mA and/or kV according to patient size and/or use of iterative reconstruction technique. CONTRAST:  157m OMNIPAQUE IOHEXOL 350 MG/ML SOLN COMPARISON:  Chest radiograph dated 02/13/2022 and CT abdomen pelvis dated 06/23/2021. FINDINGS: Evaluation of this exam is limited due to respiratory motion artifact. CTA CHEST FINDINGS Cardiovascular: There is no cardiomegaly or pericardial effusion. There is 3 vessel coronary vascular calcification and postsurgical changes of CABG. There is mild atherosclerotic calcification of the thoracic aorta no aneurysmal dilatation or dissection. The origins of the great vessels of the aortic arch appear patent. Evaluation of the pulmonary arteries is very limited due to severe  respiratory motion and suboptimal visualization of the peripheral branches. No obvious large or central pulmonary artery embolus identified. Mediastinum/Nodes: No hilar or mediastinal adenopathy. The esophagus is grossly unremarkable. No mediastinal fluid collection. Lungs/Pleura: Patchy area of nodular consolidation involving the left lower lobe and to a lesser degree in the right lower lobe may represent developing infiltrate or aspiration. There is no pleural effusion or pneumothorax. The central airways remain patent. Musculoskeletal: Degenerative changes of the spine. Median sternotomy wires. No acute osseous pathology. Review of the MIP images confirms the above findings. CT ABDOMEN and PELVIS FINDINGS No intra-abdominal free air or free fluid. Hepatobiliary: Fatty appearing liver. No biliary dilatation. Cholecystectomy. No retained calcified stone noted in the central CBD. Pancreas: Unremarkable. No pancreatic ductal dilatation or surrounding inflammatory changes. Spleen: Normal in size without focal abnormality. Adrenals/Urinary Tract: The left adrenal gland is unremarkable. 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 There is no hydronephrosis on either side. There is symmetric enhancement and excretion of contrast by both kidneys. The visualized ureters and urinary bladder appear unremarkable. Stomach/Bowel: There is loose stool throughout the colon consistent with diarrheal  state. Correlation with clinical exam and stool cultures recommended. There is no bowel obstruction or active inflammation. The appendix is unremarkable. Vascular/Lymphatic: Advanced aortoiliac atherosclerotic disease. The IVC is unremarkable. No portal venous gas. There is no adenopathy. Reproductive: The prostate and seminal vesicles are grossly unremarkable. No pelvic mass. Other: None Musculoskeletal: Osteopenia with degenerative changes of the spine. No acute osseous pathology. Review of the MIP images confirms the above  findings. IMPRESSION: 1. No obvious large or central pulmonary artery embolus on motion degraded exam. 2. Bibasilar, left greater right, developing infiltrate or aspiration. 3. Diarrheal state. Correlation with clinical exam and stool cultures recommended. No bowel obstruction. Normal appendix. 4. Fatty liver. 5.  Aortic Atherosclerosis (ICD10-I70.0). Electronically Signed   By: Anner Crete M.D.   On: 02/13/2022 22:40   CT ABDOMEN PELVIS W CONTRAST  Result Date: 02/13/2022 CLINICAL DATA:  Abdominal pain.  Concern for pulmonary embolism. EXAM: CT ANGIOGRAPHY CHEST CT ABDOMEN AND PELVIS WITH CONTRAST TECHNIQUE: Multidetector CT imaging of the chest was performed using the standard protocol during bolus administration of intravenous contrast. Multiplanar CT image reconstructions and MIPs were obtained to evaluate the vascular anatomy. Multidetector CT imaging of the abdomen and pelvis was performed using the standard protocol during bolus administration of intravenous contrast. RADIATION DOSE REDUCTION: This exam was performed according to the departmental dose-optimization program which includes automated exposure control, adjustment of the mA and/or kV according to patient size and/or use of iterative reconstruction technique. CONTRAST:  171m OMNIPAQUE IOHEXOL 350 MG/ML SOLN COMPARISON:  Chest radiograph dated 02/13/2022 and CT abdomen pelvis dated 06/23/2021. FINDINGS: Evaluation of this exam is limited due to respiratory motion artifact. CTA CHEST FINDINGS Cardiovascular: There is no cardiomegaly or pericardial effusion. There is 3 vessel coronary vascular calcification and postsurgical changes of CABG. There is mild atherosclerotic calcification of the thoracic aorta no aneurysmal dilatation or dissection. The origins of the great vessels of the aortic arch appear patent. Evaluation of the pulmonary arteries is very limited due to severe respiratory motion and suboptimal visualization of the peripheral  branches. No obvious large or central pulmonary artery embolus identified. Mediastinum/Nodes: No hilar or mediastinal adenopathy. The esophagus is grossly unremarkable. No mediastinal fluid collection. Lungs/Pleura: Patchy area of nodular consolidation involving the left lower lobe and to a lesser degree in the right lower lobe may represent developing infiltrate or aspiration. There is no pleural effusion or pneumothorax. The central airways remain patent. Musculoskeletal: Degenerative changes of the spine. Median sternotomy wires. No acute osseous pathology. Review of the MIP images confirms the above findings. CT ABDOMEN and PELVIS FINDINGS No intra-abdominal free air or free fluid. Hepatobiliary: Fatty appearing liver. No biliary dilatation. Cholecystectomy. No retained calcified stone noted in the central CBD. Pancreas: Unremarkable. No pancreatic ductal dilatation or surrounding inflammatory changes. Spleen: Normal in size without focal abnormality. Adrenals/Urinary Tract: The left adrenal gland is unremarkable. 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 There is no hydronephrosis on either side. There is symmetric enhancement and excretion of contrast by both kidneys. The visualized ureters and urinary bladder appear unremarkable. Stomach/Bowel: There is loose stool throughout the colon consistent with diarrheal state. Correlation with clinical exam and stool cultures recommended. There is no bowel obstruction or active inflammation. The appendix is unremarkable. Vascular/Lymphatic: Advanced aortoiliac atherosclerotic disease. The IVC is unremarkable. No portal venous gas. There is no adenopathy. Reproductive: The prostate and seminal vesicles are grossly unremarkable. No pelvic mass. Other: None Musculoskeletal: Osteopenia with degenerative changes of the spine. No acute osseous  pathology. Review of the MIP images confirms the above findings. IMPRESSION: 1. No obvious large or central pulmonary artery  embolus on motion degraded exam. 2. Bibasilar, left greater right, developing infiltrate or aspiration. 3. Diarrheal state. Correlation with clinical exam and stool cultures recommended. No bowel obstruction. Normal appendix. 4. Fatty liver. 5.  Aortic Atherosclerosis (ICD10-I70.0). Electronically Signed   By: Anner Crete M.D.   On: 02/13/2022 22:40   DG Chest Portable 1 View  Result Date: 02/13/2022 CLINICAL DATA:  Shortness of breath. EXAM: PORTABLE CHEST 1 VIEW COMPARISON:  06/23/2021. FINDINGS: The heart is enlarged and the mediastinal contour is stable. There is atherosclerotic calcification of the aorta. No consolidation, effusion, or pneumothorax. No acute osseous abnormality. Sternotomy wires are present over the midline. IMPRESSION: No active disease. Electronically Signed   By: Brett Fairy M.D.   On: 02/13/2022 21:05    Microbiology: Results for orders placed or performed during the hospital encounter of 02/13/22  Resp panel by RT-PCR (RSV, Flu A&B, Covid) Anterior Nasal Swab     Status: Abnormal   Collection Time: 02/13/22  8:52 PM   Specimen: Anterior Nasal Swab  Result Value Ref Range Status   SARS Coronavirus 2 by RT PCR POSITIVE (A) NEGATIVE Final    Comment: (NOTE) SARS-CoV-2 target nucleic acids are DETECTED.  The SARS-CoV-2 RNA is generally detectable in upper respiratory specimens during the acute phase of infection. Positive results are indicative of the presence of the identified virus, but do not rule out bacterial infection or co-infection with other pathogens not detected by the test. Clinical correlation with patient history and other diagnostic information is necessary to determine patient infection status. The expected result is Negative.  Fact Sheet for Patients: EntrepreneurPulse.com.au  Fact Sheet for Healthcare Providers: IncredibleEmployment.be  This test is not yet approved or cleared by the [place] FDA and   has been authorized for detection and/or diagnosis of SARS-CoV-2 by FDA under an Emergency Use Authorization (EUA).  This EUA will remain in effect (meaning this test can be used) for the duration of  the COVID-19 declaration under Section 564(b)(1) of the A ct, 21 U.S.C. section 360bbb-3(b)(1), unless the authorization is terminated or revoked sooner.     Influenza A by PCR NEGATIVE NEGATIVE Final   Influenza B by PCR NEGATIVE NEGATIVE Final    Comment: (NOTE) The Xpert Xpress SARS-CoV-2/FLU/RSV plus assay is intended as an aid in the diagnosis of influenza from Nasopharyngeal swab specimens and should not be used as a sole basis for treatment. Nasal washings and aspirates are unacceptable for Xpert Xpress SARS-CoV-2/FLU/RSV testing.  Fact Sheet for Patients: EntrepreneurPulse.com.au  Fact Sheet for Healthcare Providers: IncredibleEmployment.be  This test is not yet approved or cleared by the [place] FDA and has been authorized for detection and/or diagnosis of SARS-CoV-2 by FDA under an Emergency Use Authorization (EUA). This EUA will remain in effect (meaning this test can be used) for the duration of the COVID-19 declaration under Section 564(b)(1) of the Act, 21 U.S.C. section 360bbb-3(b)(1), unless the authorization is terminated or revoked.     Resp Syncytial Virus by PCR NEGATIVE NEGATIVE Final    Comment: (NOTE) Fact Sheet for Patients: EntrepreneurPulse.com.au  Fact Sheet for Healthcare Providers: IncredibleEmployment.be  This test is not yet approved or cleared by the [place] FDA and has been authorized for detection and/or diagnosis of SARS-CoV-2 by FDA under an Emergency Use Authorization (EUA). This EUA will remain in effect (meaning this test can be used)  for the duration of the COVID-19 declaration under Section 564(b)(1) of the Act, 21 U.S.C. section 360bbb-3(b)(1), unless  the authorization is terminated or revoked.  Performed at [facility], [address] [place], [place] [address]   Culture, blood (Routine x 2)     Status: None (Preliminary result)   Collection Time: 02/13/22  9:10 PM   Specimen: Site Not Specified; Blood  Result Value Ref Range Status   Specimen Description   Final    SITE NOT SPECIFIED BOTTLES DRAWN AEROBIC AND ANAEROBIC   Special Requests Blood Culture adequate volume  Final   Culture   Final    NO GROWTH 2 DAYS Performed at [facility], [address], [place], [place] [address]    Report Status PENDING  Incomplete  Culture, blood (Routine x 2)     Status: None (Preliminary result)   Collection Time: 02/13/22  9:16 PM   Specimen: Site Not Specified; Blood  Result Value Ref Range Status   Specimen Description SITE NOT SPECIFIED BOTTLES DRAWN AEROBIC ONLY  Final   Special Requests Blood Culture adequate volume  Final   Culture   Final    NO GROWTH 2 DAYS Performed at [facility], [address], [place], [place] [address]    Report Status PENDING  Incomplete  Urine Culture     Status: None   Collection Time: 02/13/22 11:56 PM   Specimen: In/Out Cath Urine  Result Value Ref Range Status   Specimen Description   Final    IN/OUT CATH URINE Performed at [facility], [address], [place], [place] [address]    Special Requests   Final    NONE Performed at [facility], [address], [place], [place] [address]    Culture   Final    NO GROWTH Performed at [facility], [place] [address] [place], [place] [address]    Report Status 02/15/2022 FINAL  Final  MRSA Next Gen by PCR, Nasal     Status: None   Collection Time: 02/14/22  6:30 PM   Specimen: Nasal Mucosa; Nasal Swab  Result Value Ref Range Status   MRSA by PCR Next Gen NOT DETECTED NOT DETECTED Final    Comment: (NOTE) The GeneXpert MRSA Assay (FDA approved for NASAL specimens only), is one component of a comprehensive MRSA colonization  surveillance program. It is not intended to diagnose MRSA infection nor to guide or monitor treatment for MRSA infections. Test performance is not FDA approved in patients less than 43 years old. Performed at [facility], [address] [place],  [address]     Labs: CBC: Recent Labs  Lab 02/13/22 2116 02/14/22 0555 02/15/22 0451 02/16/22 0427  WBC 10.8* 7.0 10.1 9.3  NEUTROABS 8.9* 5.3  --   --   HGB 13.3 14.3 13.1 12.6*  HCT 38.9* 42.7 38.3* 36.0*  MCV 89.4 90.1 88.5 87.0  PLT 159 142* 145* 195   Basic Metabolic Panel: Recent Labs  Lab 02/13/22 2116 02/14/22 0555 02/14/22 0816 02/16/22 0427  NA 134* 134* 131* 136  K 2.9* 3.8 3.3* 3.1*  CL 102 101 99 104  CO2 20* 22 21* 25  GLUCOSE 118* 84 89 171*  BUN '19 17 17 23  '$ CREATININE 1.18 1.03 0.97 0.80  CALCIUM 8.2* 8.1* 7.8* 7.7*  MG 1.7 1.6*  --   --    Liver Function Tests: Recent Labs  Lab 02/13/22 2116 02/14/22 0555 02/14/22 0816  AST 44* 52* 50*  ALT  43 41 40  ALKPHOS 66 56 52  BILITOT 0.7 0.9 0.9  PROT 7.4 7.1 6.6  ALBUMIN 3.8 3.5 3.3*   CBG: No results for input(s): "GLUCAP" in the last 168 hours.  Discharge time spent: greater than 30 minutes.  Signed: Barton Dubois, MD [facility] 02/16/2022

## 2022-02-16 NOTE — Assessment & Plan Note (Addendum)
-  Protonix daily initiated. -this will also provide GI prophylaxis while using prednisone.

## 2022-02-17 ENCOUNTER — Encounter: Payer: Self-pay | Admitting: *Deleted

## 2022-02-17 ENCOUNTER — Telehealth: Payer: Self-pay | Admitting: *Deleted

## 2022-02-17 LAB — LEGIONELLA PNEUMOPHILA SEROGP 1 UR AG: L. pneumophila Serogp 1 Ur Ag: NEGATIVE

## 2022-02-17 LAB — STREP PNEUMONIAE URINARY ANTIGEN: Strep Pneumo Urinary Antigen: NEGATIVE

## 2022-02-17 NOTE — Progress Notes (Signed)
  Care Coordination  Note  02/17/2022 Name: Christopher Booth MRN: [id number] DOB: 19-Nov-1940  Christopher Booth is a 82 y.o. year old primary care patient of Dennard Schaumann, Cammie Mcgee, MD. I reached out to Katha Cabal by phone today to assist with scheduling a follow up appointment. [name] verbally consented to my assistance.       Follow up plan: Hospital Follow Up appointment scheduled with (Dr. Dennard Schaumann) on (02/25/22) at (12:00 PM).  [place]  Direct Dial: [phone]

## 2022-02-17 NOTE — Patient Outreach (Signed)
  Care Coordination TOC Note Transition Care Management Unsuccessful Follow-up Telephone Call  Date of discharge and from where:  [facility] on 02/16/22  Attempts:  1st Attempt  Reason for unsuccessful TCM follow-up call:  Left voice message  Chong Sicilian, BSN, RN-BC RN Care Coordinator [place]: [phone] Main #: [phone]

## 2022-02-17 NOTE — Patient Outreach (Signed)
  Care Coordination TOC Note Transition Care Management Follow-up Telephone Call Date of discharge and from where: [facility] on 02/16/22 How have you been since you were released from the hospital? "I'm tired but improving." Any questions or concerns? No  Items Reviewed: Did the pt receive and understand the discharge instructions provided? Yes  Medications obtained and verified? Yes . Medications reconciled and med list updated Other? Yes . Discussed potassium levels and adjustment to three 44mq tablets daily. Encouraged to monitor and record blood pressure. Will need repeat CXR in 6-8 weeks to assure resolution on infiltrate. Will need BMP to assess electrolyes and renal function at PCP visit.  Any new allergies since your discharge? No  Dietary orders reviewed? Yes Do you have support at home? Yes   Home Care and Equipment/Supplies: Were home health services ordered? no If so, what is the name of the agency?   Has the agency set up a time to come to the patient's home? not applicable Were any new equipment or medical supplies ordered?  No What is the name of the medical supply agency?  Were you able to get the supplies/equipment? not applicable Do you have any questions related to the use of the equipment or supplies? No  Functional Questionnaire: (I = Independent and D = Dependent) ADLs: I  Bathing/Dressing- I  Meal Prep- I  Eating- I  Maintaining continence- I  Transferring/Ambulation- I  Managing Meds- I  Follow up appointments reviewed:  PCP Hospital f/u appt confirmed? No   Specialist Hospital f/u appt confirmed? Yes  Dr BTheador Hawthorneon 03/11/22 for labs. Patient is going to reach out to his office today regarding potassium levels since he manages that Are transportation arrangements needed? No  If their condition worsens, is the pt aware to call PCP or go to the Emergency Dept.? Yes Was the patient provided with contact information for the PCP's office or ED? Yes Was  to pt encouraged to call back with questions or concerns? Yes  SDOH assessments and interventions completed:   Yes SDOH Interventions Today    Flowsheet Row Most Recent Value  SDOH Interventions   Housing Interventions Intervention Not Indicated  Transportation Interventions Intervention Not Indicated       Care Coordination Interventions:  PCP follow up appointment requested   Encounter Outcome:  Pt. Visit Completed    KChong Sicilian BSN, RN-BC RN Care Coordinator [facility] Dial: [phone] #: [phone]

## 2022-02-18 LAB — CULTURE, BLOOD (ROUTINE X 2)
Culture: NO GROWTH
Culture: NO GROWTH
Special Requests: ADEQUATE
Special Requests: ADEQUATE

## 2022-02-21 ENCOUNTER — Inpatient Hospital Stay (HOSPITAL_COMMUNITY): Payer: PPO

## 2022-02-21 ENCOUNTER — Encounter (HOSPITAL_COMMUNITY): Payer: Self-pay | Admitting: *Deleted

## 2022-02-21 ENCOUNTER — Emergency Department (HOSPITAL_COMMUNITY): Payer: PPO

## 2022-02-21 ENCOUNTER — Other Ambulatory Visit: Payer: Self-pay

## 2022-02-21 ENCOUNTER — Inpatient Hospital Stay (HOSPITAL_COMMUNITY)
Admission: EM | Admit: 2022-02-21 | Discharge: 2022-02-28 | DRG: 871 | Disposition: A | Discharge status: Home Health | Payer: PPO | Attending: Family Medicine | Admitting: Family Medicine

## 2022-02-21 DIAGNOSIS — Z800 Family history of malignant neoplasm of digestive organs: Secondary | ICD-10-CM | POA: Diagnosis not present

## 2022-02-21 DIAGNOSIS — Z808 Family history of malignant neoplasm of other organs or systems: Secondary | ICD-10-CM | POA: Diagnosis not present

## 2022-02-21 DIAGNOSIS — Z888 Allergy status to other drugs, medicaments and biological substances status: Secondary | ICD-10-CM | POA: Diagnosis not present

## 2022-02-21 DIAGNOSIS — Z8249 Family history of ischemic heart disease and other diseases of the circulatory system: Secondary | ICD-10-CM

## 2022-02-21 DIAGNOSIS — J9601 Acute respiratory failure with hypoxia: Secondary | ICD-10-CM | POA: Diagnosis present

## 2022-02-21 DIAGNOSIS — I2510 Atherosclerotic heart disease of native coronary artery without angina pectoris: Secondary | ICD-10-CM | POA: Diagnosis not present

## 2022-02-21 DIAGNOSIS — R531 Weakness: Secondary | ICD-10-CM | POA: Diagnosis not present

## 2022-02-21 DIAGNOSIS — R0902 Hypoxemia: Secondary | ICD-10-CM

## 2022-02-21 DIAGNOSIS — R918 Other nonspecific abnormal finding of lung field: Secondary | ICD-10-CM | POA: Diagnosis not present

## 2022-02-21 DIAGNOSIS — K219 Gastro-esophageal reflux disease without esophagitis: Secondary | ICD-10-CM | POA: Diagnosis present

## 2022-02-21 DIAGNOSIS — J159 Unspecified bacterial pneumonia: Secondary | ICD-10-CM | POA: Diagnosis present

## 2022-02-21 DIAGNOSIS — R0602 Shortness of breath: Secondary | ICD-10-CM | POA: Diagnosis not present

## 2022-02-21 DIAGNOSIS — J9621 Acute and chronic respiratory failure with hypoxia: Secondary | ICD-10-CM

## 2022-02-21 DIAGNOSIS — J189 Pneumonia, unspecified organism: Secondary | ICD-10-CM | POA: Diagnosis present

## 2022-02-21 DIAGNOSIS — T380X5A Adverse effect of glucocorticoids and synthetic analogues, initial encounter: Secondary | ICD-10-CM | POA: Diagnosis not present

## 2022-02-21 DIAGNOSIS — R739 Hyperglycemia, unspecified: Secondary | ICD-10-CM | POA: Diagnosis present

## 2022-02-21 DIAGNOSIS — Z981 Arthrodesis status: Secondary | ICD-10-CM

## 2022-02-21 DIAGNOSIS — E871 Hypo-osmolality and hyponatremia: Secondary | ICD-10-CM | POA: Diagnosis present

## 2022-02-21 DIAGNOSIS — Z7982 Long term (current) use of aspirin: Secondary | ICD-10-CM

## 2022-02-21 DIAGNOSIS — J9690 Respiratory failure, unspecified, unspecified whether with hypoxia or hypercapnia: Secondary | ICD-10-CM | POA: Diagnosis not present

## 2022-02-21 DIAGNOSIS — R6520 Severe sepsis without septic shock: Secondary | ICD-10-CM | POA: Diagnosis not present

## 2022-02-21 DIAGNOSIS — M5416 Radiculopathy, lumbar region: Secondary | ICD-10-CM | POA: Diagnosis not present

## 2022-02-21 DIAGNOSIS — I10 Essential (primary) hypertension: Secondary | ICD-10-CM | POA: Diagnosis present

## 2022-02-21 DIAGNOSIS — Z79899 Other long term (current) drug therapy: Secondary | ICD-10-CM

## 2022-02-21 DIAGNOSIS — J1282 Pneumonia due to coronavirus disease 2019: Secondary | ICD-10-CM | POA: Diagnosis not present

## 2022-02-21 DIAGNOSIS — Z951 Presence of aortocoronary bypass graft: Secondary | ICD-10-CM | POA: Diagnosis not present

## 2022-02-21 DIAGNOSIS — E2601 Conn's syndrome: Secondary | ICD-10-CM | POA: Diagnosis not present

## 2022-02-21 DIAGNOSIS — I252 Old myocardial infarction: Secondary | ICD-10-CM | POA: Diagnosis not present

## 2022-02-21 DIAGNOSIS — E782 Mixed hyperlipidemia: Secondary | ICD-10-CM | POA: Diagnosis not present

## 2022-02-21 DIAGNOSIS — U071 COVID-19: Secondary | ICD-10-CM | POA: Diagnosis not present

## 2022-02-21 DIAGNOSIS — R030 Elevated blood-pressure reading, without diagnosis of hypertension: Secondary | ICD-10-CM | POA: Diagnosis present

## 2022-02-21 DIAGNOSIS — R197 Diarrhea, unspecified: Secondary | ICD-10-CM | POA: Diagnosis not present

## 2022-02-21 DIAGNOSIS — A419 Sepsis, unspecified organism: Secondary | ICD-10-CM | POA: Diagnosis not present

## 2022-02-21 DIAGNOSIS — R404 Transient alteration of awareness: Secondary | ICD-10-CM | POA: Diagnosis not present

## 2022-02-21 DIAGNOSIS — Y95 Nosocomial condition: Secondary | ICD-10-CM | POA: Diagnosis present

## 2022-02-21 DIAGNOSIS — E860 Dehydration: Secondary | ICD-10-CM | POA: Diagnosis not present

## 2022-02-21 LAB — CBC WITH DIFFERENTIAL/PLATELET
Abs Immature Granulocytes: 0.27 10*3/uL — ABNORMAL HIGH (ref 0.00–0.07)
Basophils Absolute: 0.1 10*3/uL (ref 0.0–0.1)
Basophils Relative: 0 %
Eosinophils Absolute: 0.1 10*3/uL (ref 0.0–0.5)
Eosinophils Relative: 1 %
HCT: 44.7 % (ref 39.0–52.0)
Hemoglobin: 15.4 g/dL (ref 13.0–17.0)
Immature Granulocytes: 2 %
Lymphocytes Relative: 6 %
Lymphs Abs: 1 10*3/uL (ref 0.7–4.0)
MCH: 29.9 pg (ref 26.0–34.0)
MCHC: 34.5 g/dL (ref 30.0–36.0)
MCV: 86.8 fL (ref 80.0–100.0)
Monocytes Absolute: 1.5 10*3/uL — ABNORMAL HIGH (ref 0.1–1.0)
Monocytes Relative: 9 %
Neutro Abs: 13.7 10*3/uL — ABNORMAL HIGH (ref 1.7–7.7)
Neutrophils Relative %: 82 %
Platelets: 407 10*3/uL — ABNORMAL HIGH (ref 150–400)
RBC: 5.15 MIL/uL (ref 4.22–5.81)
RDW: 12.2 % (ref 11.5–15.5)
WBC: 16.6 10*3/uL — ABNORMAL HIGH (ref 4.0–10.5)
nRBC: 0 % (ref 0.0–0.2)

## 2022-02-21 LAB — COMPREHENSIVE METABOLIC PANEL
ALT: 69 U/L — ABNORMAL HIGH (ref 0–44)
AST: 57 U/L — ABNORMAL HIGH (ref 15–41)
Albumin: 3 g/dL — ABNORMAL LOW (ref 3.5–5.0)
Alkaline Phosphatase: 77 U/L (ref 38–126)
Anion gap: 12 (ref 5–15)
BUN: 30 mg/dL — ABNORMAL HIGH (ref 8–23)
CO2: 21 mmol/L — ABNORMAL LOW (ref 22–32)
Calcium: 7.9 mg/dL — ABNORMAL LOW (ref 8.9–10.3)
Chloride: 96 mmol/L — ABNORMAL LOW (ref 98–111)
Creatinine, Ser: 1.05 mg/dL (ref 0.61–1.24)
GFR, Estimated: >60 mL/min (ref >60)
Glucose, Bld: 113 mg/dL — ABNORMAL HIGH (ref 70–99)
Potassium: 3.9 mmol/L (ref 3.5–5.1)
Sodium: 129 mmol/L — ABNORMAL LOW (ref 135–145)
Total Bilirubin: 1.5 mg/dL — ABNORMAL HIGH (ref 0.3–1.2)
Total Protein: 7.1 g/dL (ref 6.5–8.1)

## 2022-02-21 LAB — PROCALCITONIN: Procalcitonin: 0.19 ng/mL

## 2022-02-21 MED ORDER — CARVEDILOL 12.5 MG PO TABS
12.5000 mg | ORAL_TABLET | Freq: Two times a day (BID) | ORAL | Status: DC
  Administered 2022-02-22 – 2022-02-28 (×14): 12.5 mg via ORAL
  Filled 2022-02-21 (×14): qty 1

## 2022-02-21 MED ORDER — SPIRONOLACTONE 25 MG PO TABS
25.0000 mg | ORAL_TABLET | Freq: Every day | ORAL | Status: DC
  Administered 2022-02-22 – 2022-02-28 (×7): 25 mg via ORAL
  Filled 2022-02-21 (×7): qty 1

## 2022-02-21 MED ORDER — ACETAMINOPHEN 650 MG RE SUPP: 650.0000 mg | Freq: Four times a day (QID) | RECTAL | Status: DC | PRN

## 2022-02-21 MED ORDER — VITAMIN C 500 MG PO TABS
500.0000 mg | ORAL_TABLET | Freq: Every day | ORAL | Status: DC
  Administered 2022-02-22 – 2022-02-28 (×7): 500 mg via ORAL
  Filled 2022-02-21 (×7): qty 1

## 2022-02-21 MED ORDER — ONDANSETRON HCL 4 MG PO TABS: 4.0000 mg | ORAL_TABLET | Freq: Four times a day (QID) | ORAL | Status: DC | PRN

## 2022-02-21 MED ORDER — ATORVASTATIN CALCIUM 20 MG PO TABS
20.0000 mg | ORAL_TABLET | Freq: Every day | ORAL | Status: DC
  Administered 2022-02-21 – 2022-02-27 (×7): 20 mg via ORAL
  Filled 2022-02-21 (×7): qty 1

## 2022-02-21 MED ORDER — CHLORHEXIDINE GLUCONATE CLOTH 2 % EX PADS
6.0000 | MEDICATED_PAD | Freq: Every day | CUTANEOUS | Status: DC
  Administered 2022-02-21 – 2022-02-28 (×5): 6 via TOPICAL

## 2022-02-21 MED ORDER — METHYLPREDNISOLONE SODIUM SUCC 125 MG IJ SOLR
125.0000 mg | Freq: Two times a day (BID) | INTRAMUSCULAR | Status: DC
  Administered 2022-02-21: 125 mg via INTRAVENOUS
  Filled 2022-02-21: qty 2

## 2022-02-21 MED ORDER — VANCOMYCIN HCL 1250 MG/250ML IV SOLN: 1250.0000 mg | INTRAVENOUS | Status: DC

## 2022-02-21 MED ORDER — ACETAMINOPHEN 325 MG PO TABS
650.0000 mg | ORAL_TABLET | Freq: Four times a day (QID) | ORAL | Status: DC | PRN
  Administered 2022-02-23: 650 mg via ORAL
  Filled 2022-02-21: qty 2

## 2022-02-21 MED ORDER — ONDANSETRON HCL 4 MG/2ML IJ SOLN: 4.0000 mg | Freq: Four times a day (QID) | INTRAMUSCULAR | Status: DC | PRN

## 2022-02-21 MED ORDER — ADULT MULTIVITAMIN W/MINERALS CH
1.0000 | ORAL_TABLET | Freq: Every day | ORAL | Status: DC
  Administered 2022-02-22 – 2022-02-28 (×7): 1 via ORAL
  Filled 2022-02-21 (×7): qty 1

## 2022-02-21 MED ORDER — SODIUM CHLORIDE 0.9 % IV SOLN: INTRAVENOUS | Status: DC

## 2022-02-21 MED ORDER — SODIUM CHLORIDE 0.9 % IV SOLN
2.0000 g | Freq: Two times a day (BID) | INTRAVENOUS | Status: DC
  Administered 2022-02-21: 2 g via INTRAVENOUS
  Filled 2022-02-21 (×2): qty 12.5

## 2022-02-21 MED ORDER — VANCOMYCIN HCL 1500 MG/300ML IV SOLN
1500.0000 mg | Freq: Once | INTRAVENOUS | Status: AC
  Administered 2022-02-21: 1500 mg via INTRAVENOUS
  Filled 2022-02-21: qty 300

## 2022-02-21 MED ORDER — ASPIRIN 81 MG PO TBEC
81.0000 mg | DELAYED_RELEASE_TABLET | Freq: Every day | ORAL | Status: DC
  Administered 2022-02-21 – 2022-02-28 (×8): 81 mg via ORAL
  Filled 2022-02-21 (×8): qty 1

## 2022-02-21 MED ORDER — MORPHINE SULFATE (PF) 2 MG/ML IV SOLN: 2.0000 mg | INTRAVENOUS | Status: DC | PRN

## 2022-02-21 MED ORDER — OXYCODONE HCL 5 MG PO TABS: 5.0000 mg | ORAL_TABLET | ORAL | Status: DC | PRN

## 2022-02-21 MED ORDER — SODIUM CHLORIDE 0.9 % IV BOLUS
500.0000 mL | Freq: Once | INTRAVENOUS | Status: AC
  Administered 2022-02-21: 500 mL via INTRAVENOUS

## 2022-02-21 MED ORDER — LOSARTAN POTASSIUM 50 MG PO TABS
25.0000 mg | ORAL_TABLET | Freq: Every day | ORAL | Status: DC
  Administered 2022-02-22 – 2022-02-28 (×7): 25 mg via ORAL
  Filled 2022-02-21 (×7): qty 1

## 2022-02-21 MED ORDER — CALCIUM GLUCONATE-NACL 1-0.675 GM/50ML-% IV SOLN
1.0000 g | Freq: Once | INTRAVENOUS | Status: AC
  Administered 2022-02-21: 1000 mg via INTRAVENOUS
  Filled 2022-02-21: qty 50

## 2022-02-21 MED ORDER — ALBUTEROL SULFATE (2.5 MG/3ML) 0.083% IN NEBU: 2.5000 mg | INHALATION_SOLUTION | RESPIRATORY_TRACT | Status: DC | PRN

## 2022-02-21 MED ORDER — IPRATROPIUM-ALBUTEROL 0.5-2.5 (3) MG/3ML IN SOLN
3.0000 mL | Freq: Four times a day (QID) | RESPIRATORY_TRACT | Status: DC
  Administered 2022-02-21 – 2022-02-23 (×7): 3 mL via RESPIRATORY_TRACT
  Filled 2022-02-21 (×7): qty 3

## 2022-02-21 MED ORDER — IOHEXOL 350 MG/ML SOLN
100.0000 mL | Freq: Once | INTRAVENOUS | Status: AC | PRN
  Administered 2022-02-21: 75 mL via INTRAVENOUS

## 2022-02-21 MED ORDER — PANTOPRAZOLE SODIUM 40 MG PO TBEC
40.0000 mg | DELAYED_RELEASE_TABLET | Freq: Every day | ORAL | Status: DC
  Administered 2022-02-21 – 2022-02-22 (×2): 40 mg via ORAL
  Filled 2022-02-21 (×2): qty 1

## 2022-02-21 MED ORDER — HEPARIN SODIUM (PORCINE) 5000 UNIT/ML IJ SOLN
5000.0000 [IU] | Freq: Three times a day (TID) | INTRAMUSCULAR | Status: DC
  Administered 2022-02-21 – 2022-02-28 (×20): 5000 [IU] via SUBCUTANEOUS
  Filled 2022-02-21 (×21): qty 1

## 2022-02-21 NOTE — ED Notes (Signed)
Patient in CT

## 2022-02-21 NOTE — Assessment & Plan Note (Signed)
-  Sodium slightly low at 129 down from 136 - Likely secondary to diarrhea and poor p.o. intake secondary to lack of appetite - 500 mL bolus - Continue NS IV maintenance fluids - Continue to monitor

## 2022-02-21 NOTE — H&P (Signed)
History and Physical    Patient: Christopher Booth AYT:[id number] DOB: August 18, 1940 DOA: 02/21/2022 DOS: the patient was seen and examined on 02/21/2022 PCP: Susy Frizzle, MD  Patient coming from: Home  Chief Complaint:  Chief Complaint  Patient presents with   Shortness of Breath   HPI: Christopher Booth is a 82 y.o. male with medical history significant of coronary artery disease, essential hypertension, hyperaldosteronism, lumbar radiculopathy, and more presents the ED with a chief complaint of dyspnea.  Of note patient was recently admitted on January 21 and discharged on January 23 for the same complaint.  At that time he was diagnosed with acute respiratory failure with hypoxia requiring 2 L nasal cannula secondary to COVID virus and also pneumonia of the left lower lobe.  Patient completed course of baricitinib in the hospital and was discharged on 6 days of Augmentin.  Patient was not aware of his Augmentin prescription and therefore has not been taking it.  He reports about 3 days ago he started having worsening dyspnea.  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  He reports he continues to have a nonproductive cough.  He continues to have diarrhea, as he did in the last hospitalization, but it has improved.  He reports he is not having diarrhea 2-3 times a day.  It is nonbloody.  He is exertional dyspnea and generalized weakness.  He denies chest pain and wheeze.  He was discharged with an inhaler and he reports that it does not help at all.  He had no fevers.  He denies any leg swelling but does have some right calf tenderness.  He has myalgias of both legs.  Has had no appetite and has not been eating very much.  He reports he did have a protein drink today, but is about all he will take in 1 day.  He denies abdominal pain, nausea, vomiting.  Patient reports today when he checked his oxygen sat it was 62 and he knew he needed to come into the  ED.  Please pick up by EMS they put him on nonrebreather.  His oxygen sats were in the high 80s in the ED.  He did improve with 15 L heated high flow.  Admission was requested for respiratory failure with hypoxia.  Patient has no further complaints at this time.  Patient does not smoke, does not drink, does not use illicit drugs.  He is vaccinated for COVID and flu.  Patient was a Music therapist. review of Systems: As mentioned in the history of present illness. All other systems reviewed and are negative. 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[place])    Pre-diabetes    Spinal stenosis    Temporomandibular joint disease    Wears glasses    Past Surgical History:  Procedure Laterality Date   ADRENAL GLAND SURGERY  10/17/2019   CATARACT EXTRACTION W/PHACO Right 01/28/2015   Procedure: CATARACT EXTRACTION PHACO AND INTRAOCULAR LENS PLACEMENT RIGHT EYE;  Surgeon: Williams Che, MD;  Location: [facility];  Service: Ophthalmology;  Laterality: Right;  CDE:27.23   CATARACT EXTRACTION W/PHACO Left 04/14/2015   Procedure: CATARACT EXTRACTION PHACO AND INTRAOCULAR LENS PLACEMENT LEFT EYE CDE=15.86;  Surgeon: Williams Che, MD;  Location: [facility];  Service: Ophthalmology;  Laterality: Left;   [place]   COLONOSCOPY     CORONARY ARTERY BYPASS GRAFT N/A 01/12/2017   Procedure: CORONARY ARTERY BYPASS GRAFTING (CABG);  Surgeon: Grace Isaac, MD;  Location: [place];  Service: Open Heart Surgery;  Laterality: N/A;  Times 3 using left internal mammary artery to LAD and endoscopically harvested right thigh saphenous vein to OM and PD    EYE SURGERY N/A    Phreesia 07/21/2019   KNEE ARTHROSCOPY Right 10/09/2013   Procedure: RIGHT ARTHROSCOPY KNEE Partial medial and lateral meniscal tear and chondroplasty;  Surgeon: Hessie Dibble, MD;  Location: [place];  Service: Orthopedics;  Laterality: Right;  Partial medial and lateral meniscal tear and chondroplasty    LEFT HEART CATH AND CORONARY ANGIOGRAPHY N/A 01/11/2017   Procedure: LEFT HEART CATH AND CORONARY ANGIOGRAPHY;  Surgeon: Jettie Booze, MD;  Location: [place] CV LAB;  Service: Cardiovascular;  Laterality: N/A;   LUMBAR LAMINECTOMY/DECOMPRESSION MICRODISCECTOMY Right 09/20/2019   Procedure: Right Lumbar four- Lumbar five Lumbar five Sacral one Laminectomy/Foraminotomy;  Surgeon: Kristeen Miss, MD;  Location: [place];  Service: Neurosurgery;  Laterality: Right;   ROBOTIC ADRENALECTOMY Right 10/17/2019   Procedure: XI ROBOTIC ADRENALECTOMY;  Surgeon: Ralene Ok, MD;  Location: [place];  Service: General;  Laterality: Right;   TEE WITHOUT CARDIOVERSION N/A 01/12/2017   Procedure: TRANSESOPHAGEAL ECHOCARDIOGRAM (TEE);  Surgeon: Grace Isaac, MD;  Location: [place];  Service: Open Heart Surgery;  Laterality: N/A;   TONSILLECTOMY     VASECTOMY N/A    Phreesia 07/21/2019   Social History:  reports that he has never smoked. He has never been exposed to tobacco smoke. He has never used smokeless tobacco. He reports current alcohol use. He reports that he does not use drugs.  Allergies  Allergen Reactions   Diovan [Valsartan] Other (See Comments)    Unknown reaction    Family History  Problem Relation Age of Onset   Hypertension Other    Cancer Brother        Colon   Cancer Brother        Melanoma    Prior to Admission medications   Medication Sig Start Date End Date Taking? Authorizing Provider  albuterol (VENTOLIN HFA) 108 (90 Base) MCG/ACT inhaler Inhale 2 puffs into the lungs every 6 (six) hours as needed for wheezing or  shortness of breath. 02/16/22  Yes Barton Dubois, MD  amoxicillin-clavulanate (AUGMENTIN) 875-125 MG tablet Take 1 tablet by mouth 2 (two) times daily for 6 days. 02/16/22 02/22/22 Yes Barton Dubois, MD  ascorbic acid (VITAMIN C) 500 MG tablet Take 1 tablet (500 mg total) by mouth daily. 02/17/22  Yes Barton Dubois, MD  aspirin 81 MG EC tablet Take 81 mg by mouth daily.   Yes [provider]  atorvastatin (LIPITOR) 20 MG tablet TAKE ONE TABLET BY MOUTH ONCE DAILY. Patient taking differently: Take 20 mg by mouth at bedtime. 08/17/21  Yes Susy Frizzle, MD  carvedilol (COREG) 12.5 MG tablet Take 12.5 mg by mouth in the morning and at bedtime.   Yes [provider]  eplerenone (INSPRA) 25 MG tablet Take 50 mg by mouth 2 (two) times daily. 03/31/21  Yes [provider]  losartan (COZAAR) 25 MG tablet Take 1 tablet (25 mg total) by mouth daily. 09/30/20  Yes Susy Frizzle, MD  Multiple Vitamin (MULTIVITAMIN WITH MINERALS) TABS tablet Take 1 tablet by mouth daily.   Yes [provider]  pantoprazole (PROTONIX) 40 MG tablet Take 1 tablet (40 mg total) by mouth daily. 02/16/22 02/16/23 Yes Barton Dubois, MD  potassium chloride SA (KLOR-CON) 20 MEQ tablet Take 60 mEq by mouth daily. 09/21/19  Yes [provider]  predniSONE (DELTASONE) 20 MG tablet Take 3 tablets by mouth daily x 1 day; then 2 tablets by mouth daily x 2 days; then 1 tablet by mouth daily x 3 days; then half tablet by mouth daily x 3 days and stop prednisone. Patient taking differently: Take 20 mg by mouth See admin instructions. Take 3 tablets by mouth daily x 1 day; then 2 tablets by mouth daily x 2 days; then 1 tablet by mouth daily x 3 days; then half tablet by mouth daily x 3 days and stop prednisone. Start on 02/16/22 for 9 days supply 02/16/22  Yes Barton Dubois, MD  Zinc Oxide (DESITIN) 40 % PSTE Apply 3 times a day on affected area to assist with skin protection and healing. 02/16/22  Yes  Barton Dubois, MD  zinc sulfate 220 (50 Zn) MG capsule Take 1 capsule (220 mg total) by mouth daily. 02/17/22  Yes Barton Dubois, MD  dextromethorphan-guaiFENesin [facility] DM) 30-600 MG 12hr tablet Take 1 tablet by mouth 2 (two) times daily for 7 days. Patient not taking: Reported on 02/21/2022 02/16/22 02/23/22  Barton Dubois, MD  telmisartan-hydrochlorothiazide (MICARDIS HCT) 80-12.5 MG per tablet Take 1 tablet by mouth daily.  04/13/11  [provider]    Physical Exam: Vitals:   02/21/22 1810 02/21/22 1810 02/21/22 2004 02/21/22 2100  BP:   126/79 131/64  Pulse:   96 90  Resp:   (!) 27 (!) 30  Temp:   98.3 F (36.8 C) 99.4 F (37.4 C)  TempSrc:   Axillary Oral  SpO2: 96% 96% (!) 86% 94%  Weight:    76 kg  Height:    '5\' 6"'$  (1.676 m)   1.  General: Patient lying supine in bed,  no acute distress   2. Psychiatric: Alert and oriented x 3, mood and behavior normal for situation, pleasant and cooperative with exam   3. Neurologic: Speech and language are normal, face is symmetric, moves all 4 extremities voluntarily, at baseline without acute deficits on limited exam   4. HEENMT:  Head is atraumatic, normocephalic, pupils reactive to light, neck is supple, trachea is midline, mucous membranes are moist   5. Respiratory : Lungs are clear to auscultation bilaterally without wheezing, rhonchi, rales, observed dry cough, no cyanosis, maintaining oxygen saturations on 15 L high flow   6. Cardiovascular : Heart rate slightly tachycardic, rhythm is regular, no murmurs, rubs or gallops, no peripheral edema, peripheral pulses palpated   7. Gastrointestinal:  Abdomen is soft, nondistended, nontender to palpation bowel sounds active, no masses or organomegaly palpated   8. Skin:  Skin is warm, dry and intact without rashes, acute lesions, or ulcers on limited exam   9.Musculoskeletal:  No acute deformities or trauma, no asymmetry in tone, no peripheral edema, peripheral pulses  palpated, right calf tenderness  Data Reviewed: In the ED Temp 97.9, heart rate 92-98, respiratory rate 21-36, blood pressure 126/68-140/97, satting 89% on nonrebreather, up to 96% on 15 L heated high flow Leukocytosis 16.6, hemoglobin  15.4, platelets 407 Slightly hyponatremic at 129, hypochloremic at 96, signs of dehydration with BUN of 30 and creatinine of 1.05 Calcium 7.9, albumin 3.0 Chest x-ray shows left lung and new right lung opacities typical for COVID CTA pending EKG shows a heart rate of 99, sinus rhythm, QTc 429 Request for admission secondary to acute respiratory failure with hypoxia  Assessment and Plan: * Acute respiratory failure with hypoxia (HCC) - Oxygen saturations in the 60s on room air at home - When EMS picked him up they had to put nonrebreather to get his oxygen sats up to the high 80s - Patient on heated high flow at admission - COVID-positive - There is concern for bacterial pneumonia before, he was discharged with 6 more days of Augmentin but reports that he has not been taking this as he was not aware of the prescription - With increase in leukocytosis-we will go ahead and start antibiotics for HCAP - Procalcitonin pending - Patient has tachycardia, was recently hospitalized, has right leg calf tenderness-will check CTA chest - Wean off O2 as tolerated - Continue DuoNeb and albuterol - Continue to monitor  Hypocalcemia - Calcium 7.9 - Albumin 3.0 - Calcium corrects to 8.7 which is still slightly low - 1 g calcium gluconate ordered  Hyponatremia - Sodium slightly low at 129 down from 136 - Likely secondary to diarrhea and poor p.o. intake secondary to lack of appetite - 500 mL bolus - Continue NS IV maintenance fluids - Continue to monitor  Gastroesophageal reflux disease - Continue Protonix  COVID-19 virus infection - COVID-positive at last admission - Chest x-ray findings are still consistent with COVID - Completed 3 days of baricitinib -  Discharged on steroid taper, given the worsening symptoms we will go back to Solu-Medrol - Continue supportive care  Multifocal pneumonia - Recently in the hospital on Augmentin but did not finish course outpatient - Broad-spectrum coverage with vancomycin and cefepime - Procalcitonin pending - Also COVID-positive - Finish baricitinib - Continue with steroid, breathing treatments - Expectorated sputum culture - Continue to monitor  Sepsis ([place]) -Sepsis is not been ruled out yet - Patient meets sepsis criteria with tachycardia, tachypnea, leukocytosis of 16.6 - Endorgan damage with acute respiratory failure with hypoxia requiring 15 L heated high flow - Most likely secondary to COVID and bacterial pneumonia, especially given that patient was not able to finish his antibiotic at discharge - Continue broad-spectrum antibiotics to cover from HCAP -Procalcitonin pending   Mixed hyperlipidemia - Continue statin  Essential hypertension, benign - Continue our formulary substitute for eplerenone - Continue Coreg - Continue losartan - Continue to monitor      Advance Care Planning:   Code Status: Full Code   Consults: None at this time  Family Communication: No family at bedside  Severity of Illness: The appropriate patient status for this patient is INPATIENT. Inpatient status is judged to be reasonable and necessary in order to provide the required intensity of service to ensure the patient's safety. The patient's presenting symptoms, physical exam findings, and initial radiographic and laboratory data in the context of their chronic comorbidities is felt to place them at high risk for further clinical deterioration. Furthermore, it is not anticipated that the patient will be medically stable for discharge from the hospital within 2 midnights of admission.   * I certify that at the point of admission it is my clinical judgment that the patient will require inpatient hospital care  spanning beyond 2 midnights from the point  of admission due to high intensity of service, high risk for further deterioration and high frequency of surveillance required.*  Author: Rolla Plate, DO 02/21/2022 9:08 PM  For on call review [email or web address].

## 2022-02-21 NOTE — Assessment & Plan Note (Signed)
-  Continue Protonix °

## 2022-02-21 NOTE — ED Notes (Signed)
Pt changed over to high flow [place] on 14L by [place], RT per Dr. Rogene Houston. O2 sat maintaining at 96%, RR 26.

## 2022-02-21 NOTE — Assessment & Plan Note (Signed)
Continue statin. 

## 2022-02-21 NOTE — Assessment & Plan Note (Addendum)
-  Sepsis is not been ruled out yet - Patient meets sepsis criteria with tachycardia, tachypnea, leukocytosis of 16.6 - Endorgan damage with acute respiratory failure with hypoxia requiring 15 L heated high flow - Most likely secondary to COVID and bacterial pneumonia, especially given that patient was not able to finish his antibiotic at discharge - Continue broad-spectrum antibiotics to cover from HCAP -Procalcitonin pending

## 2022-02-21 NOTE — Assessment & Plan Note (Signed)
-  Recently in the hospital on Augmentin but did not finish course outpatient - Broad-spectrum coverage with vancomycin and cefepime - Procalcitonin pending - Also COVID-positive - Finish baricitinib - Continue with steroid, breathing treatments - Expectorated sputum culture - Continue to monitor

## 2022-02-21 NOTE — Progress Notes (Signed)
Pharmacy Antibiotic Note  Christopher Booth is a 82 y.o. male recently admitted for COVID and bacterial PNA s/p azithromycin and ceftriaxone ~1 week ago now re-admitted on 02/21/2022 with worsened pneumonia.  Pharmacy has been consulted for vancomycin and cefepime dosing.  Plan: Vancomycin 1500 mg IV x1, followed by vancomycin 1250 mg IV Q24h (eAUC 492, goal AUC 400-550, Scr 1.05, Vd 0.72) Cefepime 2g IV Q12h  Trend WBC, fever, renal function F/u cultures, clinical progress, levels as indicated De-escalate when able   Height: '5\' 6"'$  (167.6 cm) Weight: 77.1 kg (170 lb) IBW/kg (Calculated) : 63.8  Temp (24hrs), Avg:97.9 F (36.6 C), Min:97.9 F (36.6 C), Max:97.9 F (36.6 C)  Recent Labs  Lab 02/15/22 0451 02/16/22 0427 02/21/22 1642  WBC 10.1 9.3 16.6*  CREATININE  --  0.80 1.05    Estimated Creatinine Clearance: 53.9 mL/min (by C-G formula based on SCr of 1.05 mg/dL).    Allergies  Allergen Reactions   Diovan [Valsartan] Other (See Comments)    Unknown reaction    Thank you for allowing pharmacy to be a part of this patient's care.  Ardyth Harps, PharmD Clinical Pharmacist

## 2022-02-21 NOTE — Assessment & Plan Note (Signed)
-  Continue our formulary substitute for eplerenone - Continue Coreg - Continue losartan - Continue to monitor

## 2022-02-21 NOTE — Assessment & Plan Note (Signed)
-  Calcium 7.9 - Albumin 3.0 - Calcium corrects to 8.7 which is still slightly low - 1 g calcium gluconate ordered

## 2022-02-21 NOTE — ED Notes (Signed)
Patient provided with ice chips and water

## 2022-02-21 NOTE — Assessment & Plan Note (Signed)
-  Oxygen saturations in the 60s on room air at home - When EMS picked him up they had to put nonrebreather to get his oxygen sats up to the high 80s - Patient on heated high flow at admission - COVID-positive - There is concern for bacterial pneumonia before, he was discharged with 6 more days of Augmentin but reports that he has not been taking this as he was not aware of the prescription - With increase in leukocytosis-we will go ahead and start antibiotics for HCAP - Procalcitonin pending - Patient has tachycardia, was recently hospitalized, has right leg calf tenderness-will check CTA chest - Wean off O2 as tolerated - Continue DuoNeb and albuterol - Continue to monitor

## 2022-02-21 NOTE — Assessment & Plan Note (Signed)
-  COVID-positive at last admission - Chest x-ray findings are still consistent with COVID - Completed 3 days of baricitinib - Discharged on steroid taper, given the worsening symptoms we will go back to Solu-Medrol - Continue supportive care

## 2022-02-21 NOTE — ED Triage Notes (Signed)
Pt brought in by [facility] from home with c/o SOB and O2 sat in the 60s. Pt was admitted to the hospital last week with Covid PNA and diarrhea. He has been home for a few days but feels his symptoms have worsened. EMS arrived and initial O2 sat was in the 60s. Ramos was placed and they could only get O2 sat up to 88% so they transitioned him to a NRB with O2 sat increasing to 94%. EMS also reports delayed capillary refill and nail bed cyanosis. 15m NS bolus given by EMS PTA. 20g IV to left a/c. Pt has labored breathing, dry cough, and O2 sat of 89% on NRB upon arrival to ED.

## 2022-02-21 NOTE — ED Provider Notes (Signed)
[facility] Provider Note   CSN: [id number] Arrival date & time: 02/21/22  1601     History  Chief Complaint  Patient presents with   Shortness of Breath    Christopher BARCO is a 82 y.o. male.  Patient comes in with significant shortness of breath.  O2 sats were in the 60s according to EMS.  Patient was recently admitted January 20 through January 23.  Was diagnosed with COVID some diarrhea and left lower lobe pneumonia.  Patient is not on home oxygen.  He arrived here on 100% nonrebreather.  Oxygen sats were still 89% on that.  We contacted respiratory therapy and they started him on high flow nasal cannula oxygen sats now are in the mid to upper 90s and patient feels much better.  Medical history significant for hyperaldosteronism coronary artery disease hypertension prediabetes.  Patient is never used tobacco products.       Home Medications Prior to Admission medications   Medication Sig Start Date End Date Taking? Authorizing Provider  albuterol (VENTOLIN HFA) 108 (90 Base) MCG/ACT inhaler Inhale 2 puffs into the lungs every 6 (six) hours as needed for wheezing or shortness of breath. 02/16/22  Yes Barton Dubois, MD  amoxicillin-clavulanate (AUGMENTIN) 875-125 MG tablet Take 1 tablet by mouth 2 (two) times daily for 6 days. 02/16/22 02/22/22 Yes Barton Dubois, MD  ascorbic acid (VITAMIN C) 500 MG tablet Take 1 tablet (500 mg total) by mouth daily. 02/17/22  Yes Barton Dubois, MD  aspirin 81 MG EC tablet Take 81 mg by mouth daily.   Yes [provider]  atorvastatin (LIPITOR) 20 MG tablet TAKE ONE TABLET BY MOUTH ONCE DAILY. Patient taking differently: Take 20 mg by mouth at bedtime. 08/17/21  Yes Susy Frizzle, MD  carvedilol (COREG) 12.5 MG tablet Take 12.5 mg by mouth in the morning and at bedtime.   Yes [provider]  eplerenone (INSPRA) 25 MG tablet Take 50 mg by mouth 2 (two) times daily. 03/31/21  Yes  [provider]  losartan (COZAAR) 25 MG tablet Take 1 tablet (25 mg total) by mouth daily. 09/30/20  Yes Susy Frizzle, MD  Multiple Vitamin (MULTIVITAMIN WITH MINERALS) TABS tablet Take 1 tablet by mouth daily.   Yes [provider]  pantoprazole (PROTONIX) 40 MG tablet Take 1 tablet (40 mg total) by mouth daily. 02/16/22 02/16/23 Yes Barton Dubois, MD  potassium chloride SA (KLOR-CON) 20 MEQ tablet Take 60 mEq by mouth daily. 09/21/19  Yes [provider]  predniSONE (DELTASONE) 20 MG tablet Take 3 tablets by mouth daily x 1 day; then 2 tablets by mouth daily x 2 days; then 1 tablet by mouth daily x 3 days; then half tablet by mouth daily x 3 days and stop prednisone. Patient taking differently: Take 20 mg by mouth See admin instructions. Take 3 tablets by mouth daily x 1 day; then 2 tablets by mouth daily x 2 days; then 1 tablet by mouth daily x 3 days; then half tablet by mouth daily x 3 days and stop prednisone. Start on 02/16/22 for 9 days supply 02/16/22  Yes Barton Dubois, MD  Zinc Oxide (DESITIN) 40 % PSTE Apply 3 times a day on affected area to assist with skin protection and healing. 02/16/22  Yes Barton Dubois, MD  zinc sulfate 220 (50 Zn) MG capsule Take 1 capsule (220 mg total) by mouth daily. 02/17/22  Yes Barton Dubois, MD  dextromethorphan-guaiFENesin [facility]  DM) 30-600 MG 12hr tablet Take 1 tablet by mouth 2 (two) times daily for 7 days. Patient not taking: Reported on 02/21/2022 02/16/22 02/23/22  Barton Dubois, MD  telmisartan-hydrochlorothiazide (MICARDIS HCT) 80-12.5 MG per tablet Take 1 tablet by mouth daily.  04/13/11  [provider]      Allergies    Diovan [valsartan]    Review of Systems   Review of Systems  Constitutional:  Negative for chills and fever.  HENT:  Negative for ear pain and sore throat.   Eyes:  Negative for pain and visual disturbance.  Respiratory:  Positive for cough and shortness of breath.   Cardiovascular:   Negative for chest pain and palpitations.  Gastrointestinal:  Negative for abdominal pain and vomiting.  Genitourinary:  Negative for dysuria and hematuria.  Musculoskeletal:  Negative for arthralgias and back pain.  Skin:  Negative for color change and rash.  Neurological:  Negative for seizures and syncope.  All other systems reviewed and are negative.   Physical Exam Updated Vital Signs BP 126/68   Pulse 92   Temp 97.9 F (36.6 C) (Oral)   Resp (!) 28   Ht 1.676 m ('5\' 6"'$ )   Wt 77.1 kg   SpO2 96%   BMI 27.44 kg/m  Physical Exam Vitals and nursing note reviewed.  Constitutional:      General: He is not in acute distress.    Appearance: Normal appearance. He is well-developed.  HENT:     Head: Normocephalic and atraumatic.     Mouth/Throat:     Mouth: Mucous membranes are moist.  Eyes:     Extraocular Movements: Extraocular movements intact.     Conjunctiva/sclera: Conjunctivae normal.     Pupils: Pupils are equal, round, and reactive to light.  Cardiovascular:     Rate and Rhythm: Normal rate and regular rhythm.     Heart sounds: No murmur heard. Pulmonary:     Effort: Pulmonary effort is normal. No respiratory distress.     Breath sounds: Rhonchi present. No wheezing or rales.  Abdominal:     Palpations: Abdomen is soft.     Tenderness: There is no abdominal tenderness.  Musculoskeletal:        General: No swelling.     Cervical back: Normal range of motion and neck supple.  Skin:    General: Skin is warm and dry.     Capillary Refill: Capillary refill takes less than 2 seconds.  Neurological:     General: No focal deficit present.     Mental Status: He is alert and oriented to person, place, and time.  Psychiatric:        Mood and Affect: Mood normal.     ED Results / Procedures / Treatments   Labs (all labs ordered are listed, but only abnormal results are displayed) Labs Reviewed  COMPREHENSIVE METABOLIC PANEL - Abnormal; Notable for the following  components:      Result Value   Sodium 129 (*)    Chloride 96 (*)    CO2 21 (*)    Glucose, Bld 113 (*)    BUN 30 (*)    Calcium 7.9 (*)    Albumin 3.0 (*)    AST 57 (*)    ALT 69 (*)    Total Bilirubin 1.5 (*)    All other components within normal limits  CBC WITH DIFFERENTIAL/PLATELET - Abnormal; Notable for the following components:   WBC 16.6 (*)    Platelets 407 (*)  Neutro Abs 13.7 (*)    Monocytes Absolute 1.5 (*)    Abs Immature Granulocytes 0.27 (*)    All other components within normal limits    EKG EKG Interpretation  Date/Time:  Sunday February 21 2022 16:11:59 EST Ventricular Rate:  99 PR Interval:  175 QRS Duration: 96 QT Interval:  334 QTC Calculation: 429 R Axis:   -59 Text Interpretation: Sinus rhythm Left anterior fascicular block Abnormal R-wave progression, late transition LVH with secondary repolarization abnormality Confirmed by Fredia Sorrow [id number]) on 02/21/2022 4:31:24 PM  Radiology DG Chest Port 1 View  Result Date: 02/21/2022 CLINICAL DATA:  Shortness of breath. COVID positive. In hospital admission for COVID pneumonia. EXAM: PORTABLE CHEST 1 VIEW COMPARISON:  Radiograph and CT 02/13/2022 FINDINGS: Prior median sternotomy. Stable heart size and mediastinal contours. Aortic atherosclerosis. Worsening patchy opacity in the left mid lower lung zone. New patchy opacity in the mid lower right lung zone. No pleural fluid or pneumothorax. No acute osseous findings. IMPRESSION: Worsening left lung and new right lung opacities typical of COVID pneumonia. Electronically Signed   By: Keith Rake M.D.   On: 02/21/2022 17:37    Procedures Procedures    Medications Ordered in ED Medications - No data to display   ED Course/ Medical Decision Making/ A&P                             Medical Decision Making Amount and/or Complexity of Data Reviewed Labs: ordered. Radiology: ordered.  Risk Prescription drug management. Decision regarding  hospitalization.   Patient with significant improvement on the high flow nasal cannula.  Complete metabolic panel sodium down some at 129 glucose 113 CO2 21 total bili 1.5 renal function GFR is greater than 60 CBC white count is 16.6.  Hemoglobin 15.4 platelets 407K.  Chest x-ray worsening left lung and new right lung opacities typical of COVID-pneumonia.  Patient has hypoxia with COVID-pneumonia will require admission.  CRITICAL CARE Performed by: Fredia Sorrow Total critical care time: 45 minutes Critical care time was exclusive of separately billable procedures and treating other patients. Critical care was necessary to treat or prevent imminent or life-threatening deterioration. Critical care was time spent personally by me on the following activities: development of treatment plan with patient and/or surrogate as well as nursing, discussions with consultants, evaluation of patient's response to treatment, examination of patient, obtaining history from patient or surrogate, ordering and performing treatments and interventions, ordering and review of laboratory studies, ordering and review of radiographic studies, pulse oximetry and re-evaluation of patient's condition.  Final Clinical Impression(s) / ED Diagnoses Final diagnoses:  SOB (shortness of breath)  Hypoxia  COVID    Rx / DC Orders ED Discharge Orders     None         Fredia Sorrow, MD 02/21/22 1826

## 2022-02-22 DIAGNOSIS — J9601 Acute respiratory failure with hypoxia: Secondary | ICD-10-CM | POA: Diagnosis not present

## 2022-02-22 LAB — COMPREHENSIVE METABOLIC PANEL
ALT: 70 U/L — ABNORMAL HIGH (ref 0–44)
AST: 51 U/L — ABNORMAL HIGH (ref 15–41)
Albumin: 2.7 g/dL — ABNORMAL LOW (ref 3.5–5.0)
Alkaline Phosphatase: 77 U/L (ref 38–126)
Anion gap: 11 (ref 5–15)
BUN: 28 mg/dL — ABNORMAL HIGH (ref 8–23)
CO2: 19 mmol/L — ABNORMAL LOW (ref 22–32)
Calcium: 7.8 mg/dL — ABNORMAL LOW (ref 8.9–10.3)
Chloride: 100 mmol/L (ref 98–111)
Creatinine, Ser: 0.91 mg/dL (ref 0.61–1.24)
GFR, Estimated: >60 mL/min (ref >60)
Glucose, Bld: 180 mg/dL — ABNORMAL HIGH (ref 70–99)
Potassium: 4 mmol/L (ref 3.5–5.1)
Sodium: 130 mmol/L — ABNORMAL LOW (ref 135–145)
Total Bilirubin: 1.6 mg/dL — ABNORMAL HIGH (ref 0.3–1.2)
Total Protein: 6.7 g/dL (ref 6.5–8.1)

## 2022-02-22 LAB — CBC WITH DIFFERENTIAL/PLATELET
Abs Immature Granulocytes: 0.22 10*3/uL — ABNORMAL HIGH (ref 0.00–0.07)
Basophils Absolute: 0.1 10*3/uL (ref 0.0–0.1)
Basophils Relative: 0 %
Eosinophils Absolute: 0 10*3/uL (ref 0.0–0.5)
Eosinophils Relative: 0 %
HCT: 40.9 % (ref 39.0–52.0)
Hemoglobin: 14.2 g/dL (ref 13.0–17.0)
Immature Granulocytes: 2 %
Lymphocytes Relative: 4 %
Lymphs Abs: 0.5 10*3/uL — ABNORMAL LOW (ref 0.7–4.0)
MCH: 30.1 pg (ref 26.0–34.0)
MCHC: 34.7 g/dL (ref 30.0–36.0)
MCV: 86.8 fL (ref 80.0–100.0)
Monocytes Absolute: 0.3 10*3/uL (ref 0.1–1.0)
Monocytes Relative: 2 %
Neutro Abs: 13.3 10*3/uL — ABNORMAL HIGH (ref 1.7–7.7)
Neutrophils Relative %: 92 %
Platelets: 390 10*3/uL (ref 150–400)
RBC: 4.71 MIL/uL (ref 4.22–5.81)
RDW: 12.2 % (ref 11.5–15.5)
WBC: 14.4 10*3/uL — ABNORMAL HIGH (ref 4.0–10.5)
nRBC: 0 % (ref 0.0–0.2)

## 2022-02-22 LAB — MAGNESIUM: Magnesium: 2 mg/dL (ref 1.7–2.4)

## 2022-02-22 MED ORDER — METHYLPREDNISOLONE SODIUM SUCC 125 MG IJ SOLR
80.0000 mg | Freq: Two times a day (BID) | INTRAMUSCULAR | Status: DC
  Administered 2022-02-22 – 2022-02-27 (×12): 80 mg via INTRAVENOUS
  Filled 2022-02-22 (×13): qty 2

## 2022-02-22 MED ORDER — VITAMIN C 500 MG PO TABS: 500.0000 mg | ORAL_TABLET | Freq: Every day | ORAL | Status: DC

## 2022-02-22 MED ORDER — ZINC SULFATE 220 (50 ZN) MG PO CAPS
220.0000 mg | ORAL_CAPSULE | Freq: Every day | ORAL | Status: DC
  Administered 2022-02-22 – 2022-02-28 (×7): 220 mg via ORAL
  Filled 2022-02-22 (×7): qty 1

## 2022-02-22 MED ORDER — GUAIFENESIN-DM 100-10 MG/5ML PO SYRP: 10.0000 mL | ORAL_SOLUTION | ORAL | Status: DC | PRN

## 2022-02-22 MED ORDER — BUDESONIDE 0.25 MG/2ML IN SUSP
0.2500 mg | Freq: Two times a day (BID) | RESPIRATORY_TRACT | Status: DC
  Administered 2022-02-22 – 2022-02-28 (×13): 0.25 mg via RESPIRATORY_TRACT
  Filled 2022-02-22 (×14): qty 2

## 2022-02-22 MED ORDER — HYDROCOD POLI-CHLORPHE POLI ER 10-8 MG/5ML PO SUER: 5.0000 mL | Freq: Two times a day (BID) | ORAL | Status: DC | PRN

## 2022-02-22 NOTE — Progress Notes (Signed)
PROGRESS NOTE    Christopher Booth  PFX:[id number] DOB: 04/11/1940 DOA: 02/21/2022 PCP: Susy Frizzle, MD   Brief Narrative:    Christopher Booth is a 82 y.o. male with medical history significant of coronary artery disease, essential hypertension, hyperaldosteronism, lumbar radiculopathy, and more presents the ED with a chief complaint of dyspnea.  Of note patient was recently admitted on January 21 and discharged on January 23 for the same complaint.  At that time he was diagnosed with acute respiratory failure with hypoxia requiring 2 L nasal cannula secondary to COVID virus and also pneumonia of the left lower lobe.  Patient apparently was not taking his oral Augmentin as prescribed on discharge and there is concern for bacterial pneumonia.  He has been readmitted for acute hypoxemic respiratory failure in the setting of recent COVID infection.  Assessment & Plan:   Principal Problem:   Acute respiratory failure with hypoxia (HCC) Active Problems:   Essential hypertension, benign   Mixed hyperlipidemia   Sepsis (HCC)   Multifocal pneumonia   COVID-19 virus infection   Gastroesophageal reflux disease   Hyponatremia   Hypocalcemia  Assessment and Plan:  Acute hypoxemic respiratory failure likely in the setting of recent COVID-19 infection with possible ALI Obtain ABG No PE noted Chest x-ray and CT imaging demonstrating findings of ongoing COVID-19 infection/acute lung injury that has been associated Procalcitonin level low and therefore will discontinue antibiotics White cell count likely in relation to use of steroids Continue breathing treatments and steroids Pulmicort twice daily ordered Will likely require oxygen on discharge No prior history of tobacco abuse and patient has had COVID vaccinations Consider pulmonology evaluation in the next 24 hours if no improvement noted  COVID-19 viral infection Continue Solu-Medrol as he had COVID on prior admission Follow inflammatory  markers  Hypocalcemia Monitor closely with 1 g calcium gluconate ordered  Hyponatremia Likely in the setting of respiratory illness Continue IV normal saline  GERD PPI  Dyslipidemia Statin  Hypertension Continue home medications and monitor  DVT prophylaxis:Heparin Code Status: Full Family Communication: None at bedside Disposition Plan:  Status is: Inpatient Remains inpatient appropriate because: Need for IV medications.  Consultants:  None  Procedures:  None  Antimicrobials:  Anti-infectives (From admission, onward)    Start     Dose/Rate Route Frequency Ordered Stop   02/22/22 2000  vancomycin (VANCOREADY) IVPB 1250 mg/250 mL  Status:  Discontinued        1,250 mg 166.7 mL/hr over 90 Minutes Intravenous Every 24 hours 02/21/22 1931 02/22/22 0835   02/21/22 2000  ceFEPIme (MAXIPIME) 2 g in sodium chloride 0.9 % 100 mL IVPB  Status:  Discontinued        2 g 200 mL/hr over 30 Minutes Intravenous Every 12 hours 02/21/22 1931 02/22/22 0835   02/21/22 1945  vancomycin (VANCOREADY) IVPB 1500 mg/300 mL        1,500 mg 150 mL/hr over 120 Minutes Intravenous  Once 02/21/22 1931 02/21/22 2203      Subjective: Patient seen and evaluated today with no new acute complaints or concerns. No acute concerns or events noted overnight.  No significant dyspnea or cough noted this morning.  Objective: Vitals:   02/22/22 0623 02/22/22 0700 02/22/22 0732 02/22/22 0800  BP:  123/71  134/67  Pulse: 68 65  70  Resp: '19 15  20  '$ Temp:      TempSrc:      SpO2: 99% 98% 94% 91%  Weight:  Height:        Intake/Output Summary (Last 24 hours) at 02/22/2022 0855 Last data filed at 02/22/2022 0439 Gross per 24 hour  Intake 359.53 ml  Output 730 ml  Net -370.47 ml   Filed Weights   02/21/22 1610 02/21/22 2100  Weight: 77.1 kg 76 kg    Examination:  General exam: Appears calm and comfortable  Respiratory system: Clear to auscultation. Respiratory effort normal.  15 L high  flow nasal cannula oxygen Cardiovascular system: S1 & S2 heard, RRR.  Gastrointestinal system: Abdomen is soft Central nervous system: Alert and awake Extremities: No edema Skin: No significant lesions noted Psychiatry: Flat affect.    Data Reviewed: I have personally reviewed following labs and imaging studies  CBC: Recent Labs  Lab 02/16/22 0427 02/21/22 1642 02/22/22 0414  WBC 9.3 16.6* 14.4*  NEUTROABS  --  13.7* 13.3*  HGB 12.6* 15.4 14.2  HCT 36.0* 44.7 40.9  MCV 87.0 86.8 86.8  PLT 151 407* 469   Basic Metabolic Panel: Recent Labs  Lab 02/16/22 0427 02/21/22 1642 02/22/22 0414  NA 136 129* 130*  K 3.1* 3.9 4.0  CL 104 96* 100  CO2 25 21* 19*  GLUCOSE 171* 113* 180*  BUN 23 30* 28*  CREATININE 0.80 1.05 0.91  CALCIUM 7.7* 7.9* 7.8*  MG  --   --  2.0   GFR: Estimated Creatinine Clearance: 57.5 mL/min (by C-G formula based on SCr of 0.91 mg/dL). Liver Function Tests: Recent Labs  Lab 02/21/22 1642 02/22/22 0414  AST 57* 51*  ALT 69* 70*  ALKPHOS 77 77  BILITOT 1.5* 1.6*  PROT 7.1 6.7  ALBUMIN 3.0* 2.7*   No results for input(s): "LIPASE", "AMYLASE" in the last 168 hours. No results for input(s): "AMMONIA" in the last 168 hours. Coagulation Profile: No results for input(s): "INR", "PROTIME" in the last 168 hours. Cardiac Enzymes: No results for input(s): "CKTOTAL", "CKMB", "CKMBINDEX", "TROPONINI" in the last 168 hours. BNP (last 3 results) No results for input(s): "PROBNP" in the last 8760 hours. HbA1C: No results for input(s): "HGBA1C" in the last 72 hours. CBG: No results for input(s): "GLUCAP" in the last 168 hours. Lipid Profile: No results for input(s): "CHOL", "HDL", "LDLCALC", "TRIG", "CHOLHDL", "LDLDIRECT" in the last 72 hours. Thyroid Function Tests: No results for input(s): "TSH", "T4TOTAL", "FREET4", "T3FREE", "THYROIDAB" in the last 72 hours. Anemia Panel: No results for input(s): "VITAMINB12", "FOLATE", "FERRITIN", "TIBC",  "IRON", "RETICCTPCT" in the last 72 hours. Sepsis Labs: Recent Labs  Lab 02/21/22 1642  PROCALCITON 0.19    Recent Results (from the past 240 hour(s))  Resp panel by RT-PCR (RSV, Flu A&B, Covid) Anterior Nasal Swab     Status: Abnormal   Collection Time: 02/13/22  8:52 PM   Specimen: Anterior Nasal Swab  Result Value Ref Range Status   SARS Coronavirus 2 by RT PCR POSITIVE (A) NEGATIVE Final    Comment: (NOTE) SARS-CoV-2 target nucleic acids are DETECTED.  The SARS-CoV-2 RNA is generally detectable in upper respiratory specimens during the acute phase of infection. Positive results are indicative of the presence of the identified virus, but do not rule out bacterial infection or co-infection with other pathogens not detected by the test. Clinical correlation with patient history and other diagnostic information is necessary to determine patient infection status. The expected result is Negative.  Fact Sheet for Patients: EntrepreneurPulse.com.au  Fact Sheet for Healthcare Providers: IncredibleEmployment.be  This test is not yet approved or cleared by the [place]  FDA and  has been authorized for detection and/or diagnosis of SARS-CoV-2 by FDA under an Emergency Use Authorization (EUA).  This EUA will remain in effect (meaning this test can be used) for the duration of  the COVID-19 declaration under Section 564(b)(1) of the A ct, 21 U.S.C. section 360bbb-3(b)(1), unless the authorization is terminated or revoked sooner.     Influenza A by PCR NEGATIVE NEGATIVE Final   Influenza B by PCR NEGATIVE NEGATIVE Final    Comment: (NOTE) The Xpert Xpress SARS-CoV-2/FLU/RSV plus assay is intended as an aid in the diagnosis of influenza from Nasopharyngeal swab specimens and should not be used as a sole basis for treatment. Nasal washings and aspirates are unacceptable for Xpert Xpress SARS-CoV-2/FLU/RSV testing.  Fact Sheet for  Patients: EntrepreneurPulse.com.au  Fact Sheet for Healthcare Providers: IncredibleEmployment.be  This test is not yet approved or cleared by the [place] FDA and has been authorized for detection and/or diagnosis of SARS-CoV-2 by FDA under an Emergency Use Authorization (EUA). This EUA will remain in effect (meaning this test can be used) for the duration of the COVID-19 declaration under Section 564(b)(1) of the Act, 21 U.S.C. section 360bbb-3(b)(1), unless the authorization is terminated or revoked.     Resp Syncytial Virus by PCR NEGATIVE NEGATIVE Final    Comment: (NOTE) Fact Sheet for Patients: EntrepreneurPulse.com.au  Fact Sheet for Healthcare Providers: IncredibleEmployment.be  This test is not yet approved or cleared by the [place] FDA and has been authorized for detection and/or diagnosis of SARS-CoV-2 by FDA under an Emergency Use Authorization (EUA). This EUA will remain in effect (meaning this test can be used) for the duration of the COVID-19 declaration under Section 564(b)(1) of the Act, 21 U.S.C. section 360bbb-3(b)(1), unless the authorization is terminated or revoked.  Performed at [facility], [address], [place], [place] [address]   Culture, blood (Routine x 2)     Status: None   Collection Time: 02/13/22  9:10 PM   Specimen: Site Not Specified; Blood  Result Value Ref Range Status   Specimen Description   Final    SITE NOT SPECIFIED BOTTLES DRAWN AEROBIC AND ANAEROBIC Performed at [facility], [address] [place] [address], [place], [place] [address]    Special Requests   Final    Blood Culture adequate volume Performed at [facility], [address], [place], [place] [address]    Culture   Final    NO GROWTH 5 DAYS Performed at [facility], [place] [address] [place], [place] [address]    Report Status 02/18/2022 FINAL  Final  Culture, blood (Routine x 2)     Status:  None   Collection Time: 02/13/22  9:16 PM   Specimen: Site Not Specified; Blood  Result Value Ref Range Status   Specimen Description   Final    SITE NOT SPECIFIED BOTTLES DRAWN AEROBIC ONLY Performed at [facility], [address], [place], [place] [address]    Special Requests   Final    Blood Culture adequate volume Performed at [facility], [address], [place], [place] [address]    Culture   Final    NO GROWTH 5 DAYS Performed at [facility], [place] [address], [place], [place] [address]    Report Status 02/18/2022 FINAL  Final  Urine Culture     Status: None   Collection Time: 02/13/22 11:56 PM   Specimen: In/Out Cath Urine  Result Value Ref Range Status   Specimen Description  Final    IN/OUT CATH URINE Performed at [facility], [address] [place], [place] [address]    Special Requests   Final    NONE Performed at [facility], [address], [place], [place] [address]    Culture   Final    NO GROWTH Performed at [facility], [place] [address], [place], [place] [address]    Report Status 02/15/2022 FINAL  Final  MRSA Next Gen by PCR, Nasal     Status: None   Collection Time: 02/14/22  6:30 PM   Specimen: Nasal Mucosa; Nasal Swab  Result Value Ref Range Status   MRSA by PCR Next Gen NOT DETECTED NOT DETECTED Final    Comment: (NOTE) The GeneXpert MRSA Assay (FDA approved for NASAL specimens only), is one component of a comprehensive MRSA colonization surveillance program. It is not intended to diagnose MRSA infection nor to guide or monitor treatment for MRSA infections. Test performance is not FDA approved in patients less than 77 years old. Performed at [facility], [address], [place], [place] [address]          Radiology Studies: CT Angio Chest Pulmonary Embolism (PE) W or WO Contrast  Result Date: 02/21/2022 CLINICAL DATA:  Significant shortness of breath and hypoxia, history of COVID-19 positivity EXAM: CT ANGIOGRAPHY CHEST WITH  CONTRAST TECHNIQUE: Multidetector CT imaging of the chest was performed using the standard protocol during bolus administration of intravenous contrast. Multiplanar CT image reconstructions and MIPs were obtained to evaluate the vascular anatomy. RADIATION DOSE REDUCTION: This exam was performed according to the departmental dose-optimization program which includes automated exposure control, adjustment of the mA and/or kV according to patient size and/or use of iterative reconstruction technique. CONTRAST:  74m OMNIPAQUE IOHEXOL 350 MG/ML SOLN COMPARISON:  02/13/2022 FINDINGS: Cardiovascular: Thoracic aorta demonstrates atherosclerotic calcifications. No aneurysmal dilatation of the thoracic aorta is noted. No findings to suggest dissection are seen. Heart is mildly enlarged. Coronary calcifications are noted. The pulmonary artery shows a normal branching pattern bilaterally. No filling defect to suggest pulmonary embolism is seen. Mediastinum/Nodes: Thoracic inlet is within normal limits. No hilar or mediastinal adenopathy is noted. The esophagus is within normal limits. Lungs/Pleura: Lungs are well aerated bilaterally. Diffuse ground-glass opacities are noted throughout both lungs in a patchy distribution likely related to the underlying history of COVID-19 positivity. No focal confluent infiltrate is seen. No sizable parenchymal nodules are noted. Upper Abdomen: Gallbladder has been surgically removed. The remainder of the upper abdomen is within normal limits. Musculoskeletal: Degenerative changes of the thoracic spine are seen. No acute rib abnormality is seen. Review of the MIP images confirms the above findings. IMPRESSION: Patchy ground-glass opacities bilaterally likely related to the recent COVID-19 diagnosis. No evidence of pulmonary emboli. No other focal abnormality is seen. Aortic Atherosclerosis (ICD10-I70.0). Electronically Signed   By: MInez CatalinaM.D.   On: 02/21/2022 20:08   DG Chest Port 1  View  Result Date: 02/21/2022 CLINICAL DATA:  Shortness of breath. COVID positive. In hospital admission for COVID pneumonia. EXAM: PORTABLE CHEST 1 VIEW COMPARISON:  Radiograph and CT 02/13/2022 FINDINGS: Prior median sternotomy. Stable heart size and mediastinal contours. Aortic atherosclerosis. Worsening patchy opacity in the left mid lower lung zone. New patchy opacity in the mid lower right lung zone. No pleural fluid or pneumothorax. No acute osseous findings. IMPRESSION: Worsening left lung and new right lung opacities typical of COVID pneumonia. Electronically Signed   By: MAurther LoftD.  On: 02/21/2022 17:37        Scheduled Meds:  ascorbic acid  500 mg Oral Daily   aspirin EC  81 mg Oral Daily   atorvastatin  20 mg Oral QHS   budesonide (PULMICORT) nebulizer solution  0.25 mg Nebulization BID   carvedilol  12.5 mg Oral BID WC   Chlorhexidine Gluconate Cloth  6 each Topical Q0600   heparin  5,000 Units Subcutaneous Q8H   ipratropium-albuterol  3 mL Nebulization Q6H   losartan  25 mg Oral Daily   methylPREDNISolone (SOLU-MEDROL) injection  80 mg Intravenous Q12H   multivitamin with minerals  1 tablet Oral Daily   pantoprazole  40 mg Oral QHS   spironolactone  25 mg Oral Daily   Continuous Infusions:  sodium chloride 75 mL/hr at 02/22/22 0439     LOS: 1 day    Time spent: 35 minutes    Llewellyn Choplin Darleen Crocker, DO [facility]  If 7PM-7AM, please contact night-coverage [email or web address] 02/22/2022, 8:55 AM

## 2022-02-22 NOTE — Progress Notes (Signed)
Patient oxygen saturation dropped down to mid 80s for a while, was on 15 litres HFNC, taught breathing exercise, didn't get her sats up, so place her in a NRB 10 litres along with the HFNC , patient is not symptomatic, RT notified, satting fine for an hour, will continue to monitor.

## 2022-02-23 DIAGNOSIS — J9601 Acute respiratory failure with hypoxia: Secondary | ICD-10-CM | POA: Diagnosis not present

## 2022-02-23 DIAGNOSIS — J189 Pneumonia, unspecified organism: Secondary | ICD-10-CM | POA: Diagnosis not present

## 2022-02-23 LAB — CBC WITH DIFFERENTIAL/PLATELET
Abs Immature Granulocytes: 0.19 10*3/uL — ABNORMAL HIGH (ref 0.00–0.07)
Basophils Absolute: 0 10*3/uL (ref 0.0–0.1)
Basophils Relative: 0 %
Eosinophils Absolute: 0 10*3/uL (ref 0.0–0.5)
Eosinophils Relative: 0 %
HCT: 39.8 % (ref 39.0–52.0)
Hemoglobin: 13.8 g/dL (ref 13.0–17.0)
Immature Granulocytes: 1 %
Lymphocytes Relative: 4 %
Lymphs Abs: 0.6 10*3/uL — ABNORMAL LOW (ref 0.7–4.0)
MCH: 29.9 pg (ref 26.0–34.0)
MCHC: 34.7 g/dL (ref 30.0–36.0)
MCV: 86.3 fL (ref 80.0–100.0)
Monocytes Absolute: 1 10*3/uL (ref 0.1–1.0)
Monocytes Relative: 6 %
Neutro Abs: 13.7 10*3/uL — ABNORMAL HIGH (ref 1.7–7.7)
Neutrophils Relative %: 89 %
Platelets: 409 10*3/uL — ABNORMAL HIGH (ref 150–400)
RBC: 4.61 MIL/uL (ref 4.22–5.81)
RDW: 12.2 % (ref 11.5–15.5)
WBC: 15.5 10*3/uL — ABNORMAL HIGH (ref 4.0–10.5)
nRBC: 0 % (ref 0.0–0.2)

## 2022-02-23 LAB — BLOOD GAS, ARTERIAL
Acid-base deficit: 1.4 mmol/L (ref 0.0–2.0)
Bicarbonate: 20.9 mmol/L (ref 20.0–28.0)
Drawn by: 41977
O2 Saturation: 93 %
Patient temperature: 37
pCO2 arterial: 28 mmHg — ABNORMAL LOW (ref 32–48)
pH, Arterial: 7.48 — ABNORMAL HIGH (ref 7.35–7.45)
pO2, Arterial: 61 mmHg — ABNORMAL LOW (ref 83–108)

## 2022-02-23 LAB — COMPREHENSIVE METABOLIC PANEL
ALT: 56 U/L — ABNORMAL HIGH (ref 0–44)
AST: 32 U/L (ref 15–41)
Albumin: 2.6 g/dL — ABNORMAL LOW (ref 3.5–5.0)
Alkaline Phosphatase: 73 U/L (ref 38–126)
Anion gap: 9 (ref 5–15)
BUN: 30 mg/dL — ABNORMAL HIGH (ref 8–23)
CO2: 22 mmol/L (ref 22–32)
Calcium: 7.8 mg/dL — ABNORMAL LOW (ref 8.9–10.3)
Chloride: 100 mmol/L (ref 98–111)
Creatinine, Ser: 0.95 mg/dL (ref 0.61–1.24)
GFR, Estimated: >60 mL/min (ref >60)
Glucose, Bld: 281 mg/dL — ABNORMAL HIGH (ref 70–99)
Potassium: 4.3 mmol/L (ref 3.5–5.1)
Sodium: 131 mmol/L — ABNORMAL LOW (ref 135–145)
Total Bilirubin: 1 mg/dL (ref 0.3–1.2)
Total Protein: 6.1 g/dL — ABNORMAL LOW (ref 6.5–8.1)

## 2022-02-23 LAB — EXPECTORATED SPUTUM ASSESSMENT W GRAM STAIN, RFLX TO RESP C

## 2022-02-23 LAB — C-REACTIVE PROTEIN: CRP: 7.7 mg/dL — ABNORMAL HIGH (ref <1.0)

## 2022-02-23 LAB — MAGNESIUM: Magnesium: 2.4 mg/dL (ref 1.7–2.4)

## 2022-02-23 LAB — MRSA NEXT GEN BY PCR, NASAL: MRSA by PCR Next Gen: NOT DETECTED

## 2022-02-23 LAB — FERRITIN: Ferritin: 928 ng/mL — ABNORMAL HIGH (ref 24–336)

## 2022-02-23 LAB — SEDIMENTATION RATE: Sed Rate: 21 mm/hr — ABNORMAL HIGH (ref 0–16)

## 2022-02-23 MED ORDER — PANTOPRAZOLE SODIUM 40 MG PO TBEC
40.0000 mg | DELAYED_RELEASE_TABLET | Freq: Two times a day (BID) | ORAL | Status: DC
  Administered 2022-02-23 – 2022-02-28 (×11): 40 mg via ORAL
  Filled 2022-02-23 (×11): qty 1

## 2022-02-23 NOTE — Consult Note (Signed)
NAME:  Christopher Booth, MRN:  [id number], DOB:  03/01/1940, LOS: 2 ADMISSION DATE:  02/21/2022, CONSULTATION DATE:  1/30 REFERRING MD:  Manuella Ghazi, CHIEF COMPLAINT:  post covid resp failure   History of Present Illness:  6 yowm never smoker s/p vax  2 in 2021 for covid s/p covid 19 onset of symptom 02/06/22  admit Jan 21-23 and d/c on tapering prednisone and augmentin for possible superimposed bacteial Pna LL (but apparently never took it) > readmit 1/28 with resp failure   Pertinent  Medical History  HBP with hyperaldosteronism IHD Lumbar radiculopathy       Significant Hospital Events: Including procedures, antibiotic start and stop dates in addition to other pertinent events   PCT 1/28  0.19  (vs 15.54 1/22)  Cta 1/28  Chest CTa    1/28 Patchy ground-glass opacities bilaterally  No evidence of pulmonary emboli. Vanc/ceph/zmax rx 1/28 only     Scheduled Meds:  ascorbic acid  500 mg Oral Daily   aspirin EC  81 mg Oral Daily   atorvastatin  20 mg Oral QHS   budesonide (PULMICORT) nebulizer solution  0.25 mg Nebulization BID   carvedilol  12.5 mg Oral BID WC   Chlorhexidine Gluconate Cloth  6 each Topical Q0600   heparin  5,000 Units Subcutaneous Q8H   ipratropium-albuterol  3 mL Nebulization Q6H   losartan  25 mg Oral Daily   methylPREDNISolone (SOLU-MEDROL) injection  80 mg Intravenous Q12H   multivitamin with minerals  1 tablet Oral Daily   pantoprazole  40 mg Oral BID AC   spironolactone  25 mg Oral Daily   zinc sulfate  220 mg Oral Daily   Continuous Infusions:  sodium chloride 75 mL/hr at 02/22/22 0439   PRN Meds:.acetaminophen **OR** acetaminophen, albuterol, chlorpheniramine-HYDROcodone, guaiFENesin-dextromethorphan, morphine injection, ondansetron **OR** ondansetron (ZOFRAN) IV, oxyCODONE    Interim History / Subjective:  Dry cough, no cp or sob /swallowing ok   Objective   Blood pressure (!) 151/74, pulse 60, temperature 98.7 F (37.1 C), temperature source Oral,  resp. rate 15, height '5\' 6"'$  (1.676 m), weight 76 kg, SpO2 96 %.        Intake/Output Summary (Last 24 hours) at 02/23/2022 1305 Last data filed at 02/23/2022 0132 Gross per 24 hour  Intake --  Output 650 ml  Net -650 ml   Filed Weights   02/21/22 1610 02/21/22 2100  Weight: 77.1 kg 76 kg    Examination: Tmax:  98.7 General appearance:    less than stated age, dry cough on deep insp   At Rest 02 sats  94-6% on 15 lpm HFNC  No jvd Oropharynx clear,  mucosa nl Neck supple Lungs with crackles L > R Base on insp  RRR no s3 or or sign murmur Abd soft/ nl  excursion  Extr warm with no edema or clubbing noted Neuro  Sensorium intact ,  no apparent motor deficits    I personally reviewed images and agree with radiology impression as follows:   Chest CTa    1/28  Patchy ground-glass opacities bilaterally likely related to the recent COVID-19 diagnosis. No evidence of pulmonary emboli.    Assessment & Plan:  1) Acute resp failure/ hypoxemia s/p covid 19 infection in nl host (x for age 81)  complicated initially by suspected bact pna (supported by high PCT) but now mostly post-inflammatory GG changes likely Fibroproliferative in nature and may take a while to respond to steroids and likely relapse if not tapered  very slowly  -  CRP  1/30  7.7  vs 15.1  x 9 days prior -  Ferritin  1/30 = 928 -  ESR   1/30  = 21  With MRSA screen neg and nl PCT ok to leave off further abx   Luckily he was quite healthy prior to infection with no evidence of superimposed pna so will hopefull turn this around s further escalation of care  Rec > no specific recs, will follow along with you      Labs   CBC: Recent Labs  Lab 02/21/22 1642 02/22/22 0414 02/23/22 0444  WBC 16.6* 14.4* 15.5*  NEUTROABS 13.7* 13.3* 13.7*  HGB 15.4 14.2 13.8  HCT 44.7 40.9 39.8  MCV 86.8 86.8 86.3  PLT 407* 390 409*    Basic Metabolic Panel: Recent Labs  Lab 02/21/22 1642 02/22/22 0414 02/23/22 0444  NA  129* 130* 131*  K 3.9 4.0 4.3  CL 96* 100 100  CO2 21* 19* 22  GLUCOSE 113* 180* 281*  BUN 30* 28* 30*  CREATININE 1.05 0.91 0.95  CALCIUM 7.9* 7.8* 7.8*  MG  --  2.0 2.4   GFR: Estimated Creatinine Clearance: 55 mL/min (by C-G formula based on SCr of 0.95 mg/dL). Recent Labs  Lab 02/21/22 1642 02/22/22 0414 02/23/22 0444  PROCALCITON 0.19  --   --   WBC 16.6* 14.4* 15.5*    Liver Function Tests: Recent Labs  Lab 02/21/22 1642 02/22/22 0414 02/23/22 0444  AST 57* 51* 32  ALT 69* 70* 56*  ALKPHOS 77 77 73  BILITOT 1.5* 1.6* 1.0  PROT 7.1 6.7 6.1*  ALBUMIN 3.0* 2.7* 2.6*   No results for input(s): "LIPASE", "AMYLASE" in the last 168 hours. No results for input(s): "AMMONIA" in the last 168 hours.  ABG    Component Value Date/Time   PHART 7.48 (H) 02/23/2022 0922   PCO2ART 28 (L) 02/23/2022 0922   PO2ART 61 (L) 02/23/2022 0922   HCO3 20.9 02/23/2022 0922   TCO2 22 01/13/2017 1610   ACIDBASEDEF 1.4 02/23/2022 0922   O2SAT 93 02/23/2022 0922     Coagulation Profile: No results for input(s): "INR", "PROTIME" in the last 168 hours.  Cardiac Enzymes: No results for input(s): "CKTOTAL", "CKMB", "CKMBINDEX", "TROPONINI" in the last 168 hours.  HbA1C: Hgb A1c MFr Bld  Date/Time Value Ref Range Status  08/10/2021 02:34 PM 5.9 (H) <5.7 % of total Hgb Final    Comment:    For someone without known diabetes, a hemoglobin  A1c value between 5.7% and 6.4% is consistent with prediabetes and should be confirmed with a  follow-up test. . For someone with known diabetes, a value <7% indicates that their diabetes is well controlled. A1c targets should be individualized based on duration of diabetes, age, comorbid conditions, and other considerations. . This assay result is consistent with an increased risk of diabetes. . Currently, no consensus exists regarding use of hemoglobin A1c for diagnosis of diabetes for children. Marland Kitchen   09/23/2020 04:07 PM 6.0 (H) <5.7 %  of total Hgb Final    Comment:    For someone without known diabetes, a hemoglobin  A1c value between 5.7% and 6.4% is consistent with prediabetes and should be confirmed with a  follow-up test. . For someone with known diabetes, a value <7% indicates that their diabetes is well controlled. A1c targets should be individualized based on duration of diabetes, age, comorbid conditions, and other considerations. . This assay result is consistent  with an increased risk of diabetes. . Currently, no consensus exists regarding use of hemoglobin A1c for diagnosis of diabetes for children. .     CBG: No results for input(s): "GLUCAP" in the last 168 hours.     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   Surgical History:   Past Surgical History:  Procedure Laterality Date   ADRENAL GLAND SURGERY  10/17/2019   CATARACT EXTRACTION W/PHACO Right 01/28/2015   Procedure: CATARACT EXTRACTION PHACO AND INTRAOCULAR LENS PLACEMENT RIGHT EYE;  Surgeon: Williams Che, MD;  Location: [facility];  Service: Ophthalmology;  Laterality: Right;  CDE:27.23   CATARACT EXTRACTION W/PHACO Left 04/14/2015   Procedure: CATARACT EXTRACTION PHACO AND INTRAOCULAR LENS PLACEMENT LEFT EYE CDE=15.86;  Surgeon: Williams Che, MD;  Location: [facility];  Service: Ophthalmology;  Laterality: Left;   [place]   COLONOSCOPY     CORONARY ARTERY BYPASS GRAFT N/A 01/12/2017   Procedure: CORONARY ARTERY BYPASS GRAFTING (CABG);  Surgeon: Grace Isaac, MD;  Location: [place];  Service: Open Heart Surgery;  Laterality: N/A;  Times 3 using left internal  mammary artery to LAD and endoscopically harvested right thigh saphenous vein to OM and PD   EYE SURGERY N/A    [employer] 07/21/2019   KNEE ARTHROSCOPY Right 10/09/2013   Procedure: RIGHT ARTHROSCOPY KNEE Partial medial and lateral meniscal tear and chondroplasty;  Surgeon: Hessie Dibble, MD;  Location: [place];  Service: Orthopedics;  Laterality: Right;  Partial medial and lateral meniscal tear and chondroplasty    LEFT HEART CATH AND CORONARY ANGIOGRAPHY N/A 01/11/2017   Procedure: LEFT HEART CATH AND CORONARY ANGIOGRAPHY;  Surgeon: Jettie Booze, MD;  Location: [place] [facility];  Service: Cardiovascular;  Laterality: N/A;   LUMBAR LAMINECTOMY/DECOMPRESSION MICRODISCECTOMY Right 09/20/2019   Procedure: Right Lumbar four- Lumbar five Lumbar five Sacral one Laminectomy/Foraminotomy;  Surgeon: Kristeen Miss, MD;  Location: [place];  Service: Neurosurgery;  Laterality: Right;   ROBOTIC ADRENALECTOMY Right 10/17/2019   Procedure: XI ROBOTIC ADRENALECTOMY;  Surgeon: Ralene Ok, MD;  Location: [place];  Service: General;  Laterality: Right;   TEE WITHOUT CARDIOVERSION N/A 01/12/2017   Procedure: TRANSESOPHAGEAL ECHOCARDIOGRAM (TEE);  Surgeon: Grace Isaac, MD;  Location: [place];  Service: Open Heart Surgery;  Laterality: N/A;   TONSILLECTOMY     VASECTOMY N/A    [employer]rees[employer] 07/21/2019     Social History:   reports that he has never smoked. He has never been exposed to tobacco smoke. He has never used smokeless tobacco. He reports current alcohol use. He reports that he does not use drugs.   Family History:  His family history includes Cancer in his brother and brother; Hypertension in an other family member.   Allergies Allergies  Allergen Reactions   Diovan [Valsartan] Other (See Comments)    Unknown reaction     Home Medications  Prior to Admission medications   Medication Sig Start Date End Date Taking? Authorizing Provider  albuterol (VENTOLIN HFA) 108  (90 Base) MCG/ACT inhaler Inhale 2 puffs into the lungs every 6 (six) hours as needed for wheezing or shortness of breath. 02/16/22  Yes Barton Dubois, MD  ascorbic acid (VITAMIN C) 500 MG tablet Take 1  tablet (500 mg total) by mouth daily. 02/17/22  Yes Barton Dubois, MD  aspirin 81 MG EC tablet Take 81 mg by mouth daily.   Yes [provider]  atorvastatin (LIPITOR) 20 MG tablet TAKE ONE TABLET BY MOUTH ONCE DAILY. Patient taking differently: Take 20 mg by mouth at bedtime. 08/17/21  Yes Susy Frizzle, MD  carvedilol (COREG) 12.5 MG tablet Take 12.5 mg by mouth in the morning and at bedtime.   Yes [provider]  eplerenone (INSPRA) 25 MG tablet Take 50 mg by mouth 2 (two) times daily. 03/31/21  Yes [provider]  losartan (COZAAR) 25 MG tablet Take 1 tablet (25 mg total) by mouth daily. 09/30/20  Yes Susy Frizzle, MD  Multiple Vitamin (MULTIVITAMIN WITH MINERALS) TABS tablet Take 1 tablet by mouth daily.   Yes [provider]  pantoprazole (PROTONIX) 40 MG tablet Take 1 tablet (40 mg total) by mouth daily. 02/16/22 02/16/23 Yes Barton Dubois, MD  potassium chloride SA (KLOR-CON) 20 MEQ tablet Take 60 mEq by mouth daily. 09/21/19  Yes [provider]  predniSONE (DELTASONE) 20 MG tablet Take 3 tablets by mouth daily x 1 day; then 2 tablets by mouth daily x 2 days; then 1 tablet by mouth daily x 3 days; then half tablet by mouth daily x 3 days and stop prednisone. Patient taking differently: Take 20 mg by mouth See admin instructions. Take 3 tablets by mouth daily x 1 day; then 2 tablets by mouth daily x 2 days; then 1 tablet by mouth daily x 3 days; then half tablet by mouth daily x 3 days and stop prednisone. Start on 02/16/22 for 9 days supply 02/16/22  Yes Barton Dubois, MD  Zinc Oxide (DESITIN) 40 % PSTE Apply 3 times a day on affected area to assist with skin protection and healing. 02/16/22  Yes Barton Dubois, MD  zinc sulfate 220 (50 Zn) MG  capsule Take 1 capsule (220 mg total) by mouth daily. 02/17/22  Yes Barton Dubois, MD  dextromethorphan-guaiFENesin [facility] DM) 30-600 MG 12hr tablet Take 1 tablet by mouth 2 (two) times daily for 7 days. Patient not taking: Reported on 02/21/2022 02/16/22 02/23/22  Barton Dubois, MD  telmisartan-hydrochlorothiazide (MICARDIS HCT) 80-12.5 MG per tablet Take 1 tablet by mouth daily.  04/13/11  [provider]      The patient is critically ill with multiple organ systems failure and requires high complexity decision making for assessment and support, frequent evaluation and titration of therapies, application of advanced monitoring technologies and extensive interpretation of multiple databases. Critical Care Time devoted to patient care services described in this note is 40  minutes.   Christinia Gully, MD Pulmonary and [place] [phone]   After 7:00 pm call Elink  [phone]

## 2022-02-23 NOTE — TOC Progression Note (Signed)
Transition of Care [facility]) - Progression Note    Patient Details  Name: Christopher Booth MRN: [id number] Date of Birth: 14-Oct-1940  [facility] CM/SW Contact  Boneta Lucks, RN Phone Number: 02/23/2022, 2:25 PM  Clinical Narrative:   RN calling CM, patient is wanting home health for his wife. Explained home health and the need for PCP, RN explaining to patient as well. Explained with can order [facility] for him if needed at discharge. TOC to follow.    Expected Discharge Plan and Services      Living arrangements for the past 2 months: Single Family Home                    Social Determinants of Health (SDOH) Interventions SDOH Screenings   Food Insecurity: No Food Insecurity (02/21/2022)  Housing: Low Risk  (02/21/2022)  Transportation Needs: No Transportation Needs (02/21/2022)  Utilities: Not At Risk (02/21/2022)  Alcohol Screen: Low Risk  (10/30/2020)  Depression (PHQ2-9): Low Risk  (11/12/2021)  Financial Resource Strain: Low Risk  (10/30/2020)  Physical Activity: Insufficiently Active (10/30/2020)  Social Connections: Socially Integrated (10/30/2020)  Stress: No Stress Concern Present (10/30/2020)  Tobacco Use: Low Risk  (02/21/2022)    Readmission Risk Interventions     No data to display

## 2022-02-23 NOTE — Progress Notes (Signed)
PROGRESS NOTE    Christopher Booth  [name]:[id number] DOB: 1940/09/12 DOA: 02/21/2022 PCP: Susy Frizzle, MD   Brief Narrative:    BRAXXTON Booth is a 82 y.o. male with medical history significant of coronary artery disease, essential hypertension, hyperaldosteronism, lumbar radiculopathy, and more presents the ED with a chief complaint of dyspnea.  Of note patient was recently admitted on January 21 and discharged on January 23 for the same complaint.  At that time he was diagnosed with acute respiratory failure with hypoxia requiring 2 L nasal cannula secondary to COVID virus and also pneumonia of the left lower lobe.  Patient apparently was not taking his oral Augmentin as prescribed on discharge and there is concern for bacterial pneumonia.  He has been readmitted for acute hypoxemic respiratory failure in the setting of recent COVID infection.  Pulmonology consulted 1/30 to assist with management.  Assessment & Plan:   Principal Problem:   Acute respiratory failure with hypoxia (HCC) Active Problems:   Essential hypertension, benign   Mixed hyperlipidemia   Sepsis (HCC)   Multifocal pneumonia   COVID-19 virus infection   Gastroesophageal reflux disease   Hyponatremia   Hypocalcemia  Assessment and Plan:  Acute hypoxemic respiratory failure likely in the setting of recent COVID-19 infection with possible ALI Obtain ABG No PE noted Chest x-ray and CT imaging demonstrating findings of ongoing COVID-19 infection/acute lung injury that has been associated Procalcitonin level low and therefore will discontinue antibiotics White cell count likely in relation to use of steroids Continue breathing treatments and steroids Pulmicort twice daily ordered Will likely require oxygen on discharge No prior history of tobacco abuse and patient has had COVID vaccinations Appreciate pulmonology consultation as patient continues to have very high oxygen requirement  COVID-19 viral  infection Continue Solu-Medrol as he had COVID on prior admission Follow inflammatory markers  Hypocalcemia Corrected with low albumin  Hyponatremia-stable Likely in the setting of respiratory illness Continue IV normal saline  GERD PPI  Dyslipidemia Statin  Hypertension Continue home medications and monitor  DVT prophylaxis:Heparin Code Status: Full Family Communication: None at bedside Disposition Plan:  Status is: Inpatient Remains inpatient appropriate because: Need for IV medications.  Consultants:  Pulmonology  Procedures:  None  Antimicrobials:  Anti-infectives (From admission, onward)    Start     Dose/Rate Route Frequency Ordered Stop   02/22/22 2000  vancomycin (VANCOREADY) IVPB 1250 mg/250 mL  Status:  Discontinued        1,250 mg 166.7 mL/hr over 90 Minutes Intravenous Every 24 hours 02/21/22 1931 02/22/22 0835   02/21/22 2000  ceFEPIme (MAXIPIME) 2 g in sodium chloride 0.9 % 100 mL IVPB  Status:  Discontinued        2 g 200 mL/hr over 30 Minutes Intravenous Every 12 hours 02/21/22 1931 02/22/22 0835   02/21/22 1945  vancomycin (VANCOREADY) IVPB 1500 mg/300 mL        1,500 mg 150 mL/hr over 120 Minutes Intravenous  Once 02/21/22 1931 02/21/22 2203      Subjective: Patient seen and evaluated today with no new acute complaints or concerns. No acute concerns or events noted overnight.  No significant dyspnea or cough noted this morning.  Continues to have high oxygen requirement.  Objective: Vitals:   02/23/22 0751 02/23/22 0800 02/23/22 0900 02/23/22 1000  BP:  137/63 117/72 135/66  Pulse:  82 66 61  Resp:  19 (!) 22 (!) 24  Temp:  98.7 F (37.1 C)  TempSrc:  Oral    SpO2: 97% 98% (!) 87% 91%  Weight:      Height:        Intake/Output Summary (Last 24 hours) at 02/23/2022 1040 Last data filed at 02/23/2022 0132 Gross per 24 hour  Intake --  Output 650 ml  Net -650 ml   Filed Weights   02/21/22 1610 02/21/22 2100  Weight: 77.1 kg 76  kg    Examination:  General exam: Appears calm and comfortable  Respiratory system: Clear to auscultation. Respiratory effort normal.  15 L high flow nasal cannula oxygen Cardiovascular system: S1 & S2 heard, RRR.  Gastrointestinal system: Abdomen is soft Central nervous system: Alert and awake Extremities: No edema Skin: No significant lesions noted Psychiatry: Flat affect.    Data Reviewed: I have personally reviewed following labs and imaging studies  CBC: Recent Labs  Lab 02/21/22 1642 02/22/22 0414 02/23/22 0444  WBC 16.6* 14.4* 15.5*  NEUTROABS 13.7* 13.3* 13.7*  HGB 15.4 14.2 13.8  HCT 44.7 40.9 39.8  MCV 86.8 86.8 86.3  PLT 407* 390 557*   Basic Metabolic Panel: Recent Labs  Lab 02/21/22 1642 02/22/22 0414 02/23/22 0444  NA 129* 130* 131*  K 3.9 4.0 4.3  CL 96* 100 100  CO2 21* 19* 22  GLUCOSE 113* 180* 281*  BUN 30* 28* 30*  CREATININE 1.05 0.91 0.95  CALCIUM 7.9* 7.8* 7.8*  MG  --  2.0 2.4   GFR: Estimated Creatinine Clearance: 55 mL/min (by C-G formula based on SCr of 0.95 mg/dL). Liver Function Tests: Recent Labs  Lab 02/21/22 1642 02/22/22 0414 02/23/22 0444  AST 57* 51* 32  ALT 69* 70* 56*  ALKPHOS 77 77 73  BILITOT 1.5* 1.6* 1.0  PROT 7.1 6.7 6.1*  ALBUMIN 3.0* 2.7* 2.6*   No results for input(s): "LIPASE", "AMYLASE" in the last 168 hours. No results for input(s): "AMMONIA" in the last 168 hours. Coagulation Profile: No results for input(s): "INR", "PROTIME" in the last 168 hours. Cardiac Enzymes: No results for input(s): "CKTOTAL", "CKMB", "CKMBINDEX", "TROPONINI" in the last 168 hours. BNP (last 3 results) No results for input(s): "PROBNP" in the last 8760 hours. HbA1C: No results for input(s): "HGBA1C" in the last 72 hours. CBG: No results for input(s): "GLUCAP" in the last 168 hours. Lipid Profile: No results for input(s): "CHOL", "HDL", "LDLCALC", "TRIG", "CHOLHDL", "LDLDIRECT" in the last 72 hours. Thyroid Function  Tests: No results for input(s): "TSH", "T4TOTAL", "FREET4", "T3FREE", "THYROIDAB" in the last 72 hours. Anemia Panel: Recent Labs    02/23/22 0444  FERRITIN 928*   Sepsis Labs: Recent Labs  Lab 02/21/22 1642  PROCALCITON 0.19    Recent Results (from the past 240 hour(s))  Resp panel by RT-PCR (RSV, Flu A&B, Covid) Anterior Nasal Swab     Status: Abnormal   Collection Time: 02/13/22  8:52 PM   Specimen: Anterior Nasal Swab  Result Value Ref Range Status   SARS Coronavirus 2 by RT PCR POSITIVE (A) NEGATIVE Final    Comment: (NOTE) SARS-CoV-2 target nucleic acids are DETECTED.  The SARS-CoV-2 RNA is generally detectable in upper respiratory specimens during the acute phase of infection. Positive results are indicative of the presence of the identified virus, but do not rule out bacterial infection or co-infection with other pathogens not detected by the test. Clinical correlation with patient history and other diagnostic information is necessary to determine patient infection status. The expected result is Negative.  Fact Sheet for Patients: EntrepreneurPulse.com.au  Fact Sheet for Healthcare Providers: IncredibleEmployment.be  This test is not yet approved or cleared by the [place] FDA and  has been authorized for detection and/or diagnosis of SARS-CoV-2 by FDA under an Emergency Use Authorization (EUA).  This EUA will remain in effect (meaning this test can be used) for the duration of  the COVID-19 declaration under Section 564(b)(1) of the A ct, 21 U.S.C. section 360bbb-3(b)(1), unless the authorization is terminated or revoked sooner.     Influenza A by PCR NEGATIVE NEGATIVE Final   Influenza B by PCR NEGATIVE NEGATIVE Final    Comment: (NOTE) The Xpert Xpress SARS-CoV-2/FLU/RSV plus assay is intended as an aid in the diagnosis of influenza from Nasopharyngeal swab specimens and should not be used as a sole basis for  treatment. Nasal washings and aspirates are unacceptable for Xpert Xpress SARS-CoV-2/FLU/RSV testing.  Fact Sheet for Patients: EntrepreneurPulse.com.au  Fact Sheet for Healthcare Providers: IncredibleEmployment.be  This test is not yet approved or cleared by the [place] FDA and has been authorized for detection and/or diagnosis of SARS-CoV-2 by FDA under an Emergency Use Authorization (EUA). This EUA will remain in effect (meaning this test can be used) for the duration of the COVID-19 declaration under Section 564(b)(1) of the Act, 21 U.S.C. section 360bbb-3(b)(1), unless the authorization is terminated or revoked.     Resp Syncytial Virus by PCR NEGATIVE NEGATIVE Final    Comment: (NOTE) Fact Sheet for Patients: EntrepreneurPulse.com.au  Fact Sheet for Healthcare Providers: IncredibleEmployment.be  This test is not yet approved or cleared by the [place] FDA and has been authorized for detection and/or diagnosis of SARS-CoV-2 by FDA under an Emergency Use Authorization (EUA). This EUA will remain in effect (meaning this test can be used) for the duration of the COVID-19 declaration under Section 564(b)(1) of the Act, 21 U.S.C. section 360bbb-3(b)(1), unless the authorization is terminated or revoked.  Performed at [facility], [address] [place], [place] [address]   Culture, blood (Routine x 2)     Status: None   Collection Time: 02/13/22  9:10 PM   Specimen: Site Not Specified; Blood  Result Value Ref Range Status   Specimen Description   Final    SITE NOT SPECIFIED BOTTLES DRAWN AEROBIC AND ANAEROBIC Performed at [facility], [address] [place], [place] [address]    Special Requests   Final    Blood Culture adequate volume Performed at [facility], [address] [place], [place] [address]    Culture   Final    NO GROWTH 5 DAYS Performed at [facility], [place] [address], [place], [place] [address]    Report Status 02/18/2022 FINAL  Final  Culture, blood (Routine x 2)     Status: None   Collection Time: 02/13/22  9:16 PM   Specimen: Site Not Specified; Blood  Result Value Ref Range Status   Specimen Description   Final    SITE NOT SPECIFIED BOTTLES DRAWN AEROBIC ONLY Performed at [facility], [address], [place], [place] [address]    Special Requests   Final    Blood Culture adequate volume Performed at [facility], [address], [place], [place] [address]    Culture   Final    NO GROWTH 5 DAYS Performed at [facility], [place] [address] [place],  [address]    Report Status 02/18/2022 FINAL  Final  Urine Culture     Status: None   Collection Time: 02/13/22  11:56 PM   Specimen: In/Out Cath Urine  Result Value Ref Range Status   Specimen Description   Final    IN/OUT CATH URINE Performed at [facility], [address] [place], [place] [address]    Special Requests   Final    NONE Performed at [facility], [address], [place], [place] [address]    Culture   Final    NO GROWTH Performed at [facility], [place] [address], [place], [place] [address]    Report Status 02/15/2022 FINAL  Final  MRSA Next Gen by PCR, Nasal     Status: None   Collection Time: 02/14/22  6:30 PM   Specimen: Nasal Mucosa; Nasal Swab  Result Value Ref Range Status   MRSA by PCR Next Gen NOT DETECTED NOT DETECTED Final    Comment: (NOTE) The GeneXpert MRSA Assay (FDA approved for NASAL specimens only), is one component of a comprehensive MRSA colonization surveillance program. It is not intended to diagnose MRSA infection nor to guide or monitor treatment for MRSA infections. Test performance is not FDA approved in patients less than 83 years old. Performed at [facility], [address] [place], [place] [address]   Expectorated Sputum Assessment w Gram Stain, Rflx to Resp Cult     Status: None   Collection Time: 02/22/22 11:36 PM    Specimen: Sputum  Result Value Ref Range Status   Specimen Description SPUTUM  Final   Special Requests NONE  Final   Sputum evaluation   Final    Sputum specimen not acceptable for testing.  Please recollect.   NOTIFIED TINAJERO,E'@[id number]'$  BY MATTHEWS, B 1.30.2024 Performed at [facility], [address] [place], [place] [address]    Report Status 02/23/2022 FINAL  Final         Radiology Studies: CT Angio Chest Pulmonary Embolism (PE) W or WO Contrast  Result Date: 02/21/2022 CLINICAL DATA:  Significant shortness of breath and hypoxia, history of COVID-19 positivity EXAM: CT ANGIOGRAPHY CHEST WITH CONTRAST TECHNIQUE: Multidetector CT imaging of the chest was performed using the standard protocol during bolus administration of intravenous contrast. Multiplanar CT image reconstructions and MIPs were obtained to evaluate the vascular anatomy. RADIATION DOSE REDUCTION: This exam was performed according to the departmental dose-optimization program which includes automated exposure control, adjustment of the mA and/or kV according to patient size and/or use of iterative reconstruction technique. CONTRAST:  7m OMNIPAQUE IOHEXOL 350 MG/ML SOLN COMPARISON:  02/13/2022 FINDINGS: Cardiovascular: Thoracic aorta demonstrates atherosclerotic calcifications. No aneurysmal dilatation of the thoracic aorta is noted. No findings to suggest dissection are seen. Heart is mildly enlarged. Coronary calcifications are noted. The pulmonary artery shows a normal branching pattern bilaterally. No filling defect to suggest pulmonary embolism is seen. Mediastinum/Nodes: Thoracic inlet is within normal limits. No hilar or mediastinal adenopathy is noted. The esophagus is within normal limits. Lungs/Pleura: Lungs are well aerated bilaterally. Diffuse ground-glass opacities are noted throughout both lungs in a patchy distribution likely related to the underlying history of COVID-19 positivity. No focal confluent infiltrate is  seen. No sizable parenchymal nodules are noted. Upper Abdomen: Gallbladder has been surgically removed. The remainder of the upper abdomen is within normal limits. Musculoskeletal: Degenerative changes of the thoracic spine are seen. No acute rib abnormality is seen. Review of the MIP images confirms the above findings. IMPRESSION: Patchy ground-glass opacities bilaterally likely related to the recent COVID-19 diagnosis. No evidence of pulmonary emboli. No other focal abnormality is seen. Aortic Atherosclerosis (ICD10-I70.0).  Electronically Signed   By: Inez Catalina M.D.   On: 02/21/2022 20:08   DG Chest Port 1 View  Result Date: 02/21/2022 CLINICAL DATA:  Shortness of breath. COVID positive. In hospital admission for COVID pneumonia. EXAM: PORTABLE CHEST 1 VIEW COMPARISON:  Radiograph and CT 02/13/2022 FINDINGS: Prior median sternotomy. Stable heart size and mediastinal contours. Aortic atherosclerosis. Worsening patchy opacity in the left mid lower lung zone. New patchy opacity in the mid lower right lung zone. No pleural fluid or pneumothorax. No acute osseous findings. IMPRESSION: Worsening left lung and new right lung opacities typical of COVID pneumonia. Electronically Signed   By: Keith Rake M.D.   On: 02/21/2022 17:37        Scheduled Meds:  ascorbic acid  500 mg Oral Daily   aspirin EC  81 mg Oral Daily   atorvastatin  20 mg Oral QHS   budesonide (PULMICORT) nebulizer solution  0.25 mg Nebulization BID   carvedilol  12.5 mg Oral BID WC   Chlorhexidine Gluconate Cloth  6 each Topical Q0600   heparin  5,000 Units Subcutaneous Q8H   ipratropium-albuterol  3 mL Nebulization Q6H   losartan  25 mg Oral Daily   methylPREDNISolone (SOLU-MEDROL) injection  80 mg Intravenous Q12H   multivitamin with minerals  1 tablet Oral Daily   pantoprazole  40 mg Oral BID AC   spironolactone  25 mg Oral Daily   zinc sulfate  220 mg Oral Daily   Continuous Infusions:  sodium chloride 75 mL/hr at  02/22/22 0439     LOS: 2 days    Time spent: 35 minutes    Leanny Moeckel Darleen Crocker, DO [facility]  If 7PM-7AM, please contact night-coverage [email or web address] 02/23/2022, 10:40 AM

## 2022-02-24 ENCOUNTER — Other Ambulatory Visit: Payer: Self-pay

## 2022-02-24 ENCOUNTER — Inpatient Hospital Stay (HOSPITAL_COMMUNITY): Payer: PPO

## 2022-02-24 DIAGNOSIS — E896 Postprocedural adrenocortical (-medullary) hypofunction: Secondary | ICD-10-CM

## 2022-02-24 DIAGNOSIS — E782 Mixed hyperlipidemia: Secondary | ICD-10-CM

## 2022-02-24 DIAGNOSIS — I10 Essential (primary) hypertension: Secondary | ICD-10-CM

## 2022-02-24 DIAGNOSIS — U071 COVID-19: Secondary | ICD-10-CM | POA: Diagnosis not present

## 2022-02-24 DIAGNOSIS — J9601 Acute respiratory failure with hypoxia: Secondary | ICD-10-CM | POA: Diagnosis not present

## 2022-02-24 DIAGNOSIS — J189 Pneumonia, unspecified organism: Secondary | ICD-10-CM | POA: Diagnosis not present

## 2022-02-24 DIAGNOSIS — Z951 Presence of aortocoronary bypass graft: Secondary | ICD-10-CM

## 2022-02-24 LAB — HEMOGLOBIN A1C
Hgb A1c MFr Bld: 6.7 % — ABNORMAL HIGH (ref 4.8–5.6)
Mean Plasma Glucose: 145.59 mg/dL

## 2022-02-24 LAB — FERRITIN: Ferritin: 816 ng/mL — ABNORMAL HIGH (ref 24–336)

## 2022-02-24 LAB — COMPREHENSIVE METABOLIC PANEL
ALT: 59 U/L — ABNORMAL HIGH (ref 0–44)
AST: 30 U/L (ref 15–41)
Albumin: 2.6 g/dL — ABNORMAL LOW (ref 3.5–5.0)
Alkaline Phosphatase: 72 U/L (ref 38–126)
Anion gap: 6 (ref 5–15)
BUN: 29 mg/dL — ABNORMAL HIGH (ref 8–23)
CO2: 25 mmol/L (ref 22–32)
Calcium: 8.2 mg/dL — ABNORMAL LOW (ref 8.9–10.3)
Chloride: 103 mmol/L (ref 98–111)
Creatinine, Ser: 0.87 mg/dL (ref 0.61–1.24)
GFR, Estimated: >60 mL/min (ref >60)
Glucose, Bld: 214 mg/dL — ABNORMAL HIGH (ref 70–99)
Potassium: 4.9 mmol/L (ref 3.5–5.1)
Sodium: 134 mmol/L — ABNORMAL LOW (ref 135–145)
Total Bilirubin: 1.1 mg/dL (ref 0.3–1.2)
Total Protein: 6.1 g/dL — ABNORMAL LOW (ref 6.5–8.1)

## 2022-02-24 LAB — CBC WITH DIFFERENTIAL/PLATELET
Abs Immature Granulocytes: 0.17 10*3/uL — ABNORMAL HIGH (ref 0.00–0.07)
Basophils Absolute: 0 10*3/uL (ref 0.0–0.1)
Basophils Relative: 0 %
Eosinophils Absolute: 0 10*3/uL (ref 0.0–0.5)
Eosinophils Relative: 0 %
HCT: 39.7 % (ref 39.0–52.0)
Hemoglobin: 13.5 g/dL (ref 13.0–17.0)
Immature Granulocytes: 1 %
Lymphocytes Relative: 3 %
Lymphs Abs: 0.6 10*3/uL — ABNORMAL LOW (ref 0.7–4.0)
MCH: 29.9 pg (ref 26.0–34.0)
MCHC: 34 g/dL (ref 30.0–36.0)
MCV: 87.8 fL (ref 80.0–100.0)
Monocytes Absolute: 0.9 10*3/uL (ref 0.1–1.0)
Monocytes Relative: 5 %
Neutro Abs: 15.4 10*3/uL — ABNORMAL HIGH (ref 1.7–7.7)
Neutrophils Relative %: 91 %
Platelets: 403 10*3/uL — ABNORMAL HIGH (ref 150–400)
RBC: 4.52 MIL/uL (ref 4.22–5.81)
RDW: 12 % (ref 11.5–15.5)
WBC: 17 10*3/uL — ABNORMAL HIGH (ref 4.0–10.5)
nRBC: 0 % (ref 0.0–0.2)

## 2022-02-24 LAB — MAGNESIUM: Magnesium: 2.5 mg/dL — ABNORMAL HIGH (ref 1.7–2.4)

## 2022-02-24 LAB — GLUCOSE, CAPILLARY
Glucose-Capillary: 247 mg/dL — ABNORMAL HIGH (ref 70–99)
Glucose-Capillary: 263 mg/dL — ABNORMAL HIGH (ref 70–99)

## 2022-02-24 LAB — C-REACTIVE PROTEIN: CRP: 3.8 mg/dL — ABNORMAL HIGH (ref <1.0)

## 2022-02-24 MED ORDER — MORPHINE SULFATE (PF) 2 MG/ML IV SOLN: 1.0000 mg | INTRAVENOUS | Status: DC | PRN

## 2022-02-24 MED ORDER — IPRATROPIUM-ALBUTEROL 0.5-2.5 (3) MG/3ML IN SOLN
3.0000 mL | Freq: Three times a day (TID) | RESPIRATORY_TRACT | Status: DC
  Administered 2022-02-24 – 2022-02-28 (×14): 3 mL via RESPIRATORY_TRACT
  Filled 2022-02-24 (×14): qty 3

## 2022-02-24 MED ORDER — INSULIN ASPART 100 UNIT/ML IJ SOLN
5.0000 [IU] | Freq: Three times a day (TID) | INTRAMUSCULAR | Status: DC
  Administered 2022-02-24 – 2022-02-28 (×13): 5 [IU] via SUBCUTANEOUS

## 2022-02-24 MED ORDER — INSULIN ASPART 100 UNIT/ML IJ SOLN
0.0000 [IU] | Freq: Three times a day (TID) | INTRAMUSCULAR | Status: DC
  Administered 2022-02-24: 8 [IU] via SUBCUTANEOUS
  Administered 2022-02-24: 5 [IU] via SUBCUTANEOUS
  Administered 2022-02-25: 2 [IU] via SUBCUTANEOUS
  Administered 2022-02-25: 3 [IU] via SUBCUTANEOUS
  Administered 2022-02-25 – 2022-02-26 (×2): 5 [IU] via SUBCUTANEOUS
  Administered 2022-02-26: 3 [IU] via SUBCUTANEOUS
  Administered 2022-02-26 – 2022-02-27 (×2): 5 [IU] via SUBCUTANEOUS
  Administered 2022-02-27: 3 [IU] via SUBCUTANEOUS
  Administered 2022-02-27: 8 [IU] via SUBCUTANEOUS
  Administered 2022-02-28 (×2): 5 [IU] via SUBCUTANEOUS

## 2022-02-24 NOTE — Consult Note (Deleted)
NAME:  Christopher Booth, MRN:  [id number], DOB:  05-21-1940, LOS: 3 ADMISSION DATE:  02/21/2022, CONSULTATION DATE:  1/30 REFERRING MD:  Manuella Ghazi, CHIEF COMPLAINT:  post covid resp failure   History of Present Illness:  61 yowm never smoker s/p vax  2 in 2021 for covid s/p covid 19 onset of symptom 02/06/22  admit Jan 21-23 and d/c on tapering prednisone and augmentin for possible superimposed bacteial Pna LL (but apparently never took it) > readmit 1/28 with resp failure   Pertinent  Medical History  HBP with hyperaldosteronism IHD Lumbar radiculopathy       Significant Hospital Events: Including procedures, antibiotic start and stop dates in addition to other pertinent events   PCT 1/28  0.19  (vs 15.54 1/22)  Cta 1/28  Chest CTa    1/28 Patchy ground-glass opacities bilaterally  No evidence of pulmonary emboli. Vanc/ceph/zmax rx 1/28 only     Scheduled Meds:  ascorbic acid  500 mg Oral Daily   aspirin EC  81 mg Oral Daily   atorvastatin  20 mg Oral QHS   budesonide (PULMICORT) nebulizer solution  0.25 mg Nebulization BID   carvedilol  12.5 mg Oral BID WC   Chlorhexidine Gluconate Cloth  6 each Topical Q0600   heparin  5,000 Units Subcutaneous Q8H   insulin aspart  0-15 Units Subcutaneous TID WC   insulin aspart  5 Units Subcutaneous TID WC   ipratropium-albuterol  3 mL Nebulization TID   losartan  25 mg Oral Daily   methylPREDNISolone (SOLU-MEDROL) injection  80 mg Intravenous Q12H   multivitamin with minerals  1 tablet Oral Daily   pantoprazole  40 mg Oral BID AC   spironolactone  25 mg Oral Daily   zinc sulfate  220 mg Oral Daily   Continuous Infusions:  sodium chloride 55 mL/hr at 02/24/22 1045   PRN Meds:.acetaminophen **OR** acetaminophen, albuterol, chlorpheniramine-HYDROcodone, guaiFENesin-dextromethorphan, morphine injection, ondansetron **OR** ondansetron (ZOFRAN) IV, oxyCODONE    Interim History / Subjective:  No new complaints / wants to get up in chair    Objective   Blood pressure (!) 150/109, pulse 71, temperature (!) 97.5 F (36.4 C), temperature source Oral, resp. rate (!) 25, height '5\' 6"'$  (1.676 m), weight 76 kg, SpO2 94 %.        Intake/Output Summary (Last 24 hours) at 02/24/2022 1257 Last data filed at 02/24/2022 0914 Gross per 24 hour  Intake 240 ml  Output 1000 ml  Net -760 ml   Filed Weights   02/21/22 1610 02/21/22 2100  Weight: 77.1 kg 76 kg    Examination: Tmax:  98.6 General appearance:    pleasant wm sitting up at 60 degrees/ min dry cough  At Rest 02 sats  92% on 10 lpm   No jvd Oropharynx clear,  mucosa nl Neck supple Lungs withinsp crackles L>R base with cough on deep insp RRR no s3 or or sign murmur Abd soft/ benign with nl  excursion  Extr warm with no edema or clubbing noted Neuro  Sensorium intact ,  no apparent motor deficits    I personally reviewed images and  impression as follows:  CXR:   portable 1/31 ? Faint air bronchograms LLL/ overall minimal change   Assessment & Plan:  1) Acute resp failure/ hypoxemia s/p covid 19 infection in nl host (x for age 82)  complicated initially by suspected bact pna (supported by high PCT) but now mostly post-inflammatory GG changes likely Fibroproliferative in nature and may  take a while to respond to steroids and likely relapse if not tapered very slowly  -  CRP  1/30  7.7  vs 15.1  x 9 days prior -  Ferritin  1/30 = 928 -  ESR   1/30  = 21  With MRSA screen neg and nl PCT ok to continue off abx     Rec > check LHD/ESR am 21 / no change rx in meantime     Labs   CBC: Recent Labs  Lab 02/21/22 1642 02/22/22 0414 02/23/22 0444 02/24/22 0430  WBC 16.6* 14.4* 15.5* 17.0*  NEUTROABS 13.7* 13.3* 13.7* 15.4*  HGB 15.4 14.2 13.8 13.5  HCT 44.7 40.9 39.8 39.7  MCV 86.8 86.8 86.3 87.8  PLT 407* 390 409* 403*    Basic Metabolic Panel: Recent Labs  Lab 02/21/22 1642 02/22/22 0414 02/23/22 0444 02/24/22 0430  NA 129* 130* 131* 134*  K 3.9  4.0 4.3 4.9  CL 96* 100 100 103  CO2 21* 19* 22 25  GLUCOSE 113* 180* 281* 214*  BUN 30* 28* 30* 29*  CREATININE 1.05 0.91 0.95 0.87  CALCIUM 7.9* 7.8* 7.8* 8.2*  MG  --  2.0 2.4 2.5*   GFR: Estimated Creatinine Clearance: 60.1 mL/min (by C-G formula based on SCr of 0.87 mg/dL). Recent Labs  Lab 02/21/22 1642 02/22/22 0414 02/23/22 0444 02/24/22 0430  PROCALCITON 0.19  --   --   --   WBC 16.6* 14.4* 15.5* 17.0*    Liver Function Tests: Recent Labs  Lab 02/21/22 1642 02/22/22 0414 02/23/22 0444 02/24/22 0430  AST 57* 51* 32 30  ALT 69* 70* 56* 59*  ALKPHOS 77 77 73 72  BILITOT 1.5* 1.6* 1.0 1.1  PROT 7.1 6.7 6.1* 6.1*  ALBUMIN 3.0* 2.7* 2.6* 2.6*   No results for input(s): "LIPASE", "AMYLASE" in the last 168 hours. No results for input(s): "AMMONIA" in the last 168 hours.  ABG    Component Value Date/Time   PHART 7.48 (H) 02/23/2022 0922   PCO2ART 28 (L) 02/23/2022 0922   PO2ART 61 (L) 02/23/2022 0922   HCO3 20.9 02/23/2022 0922   TCO2 22 01/13/2017 1610   ACIDBASEDEF 1.4 02/23/2022 0922   O2SAT 93 02/23/2022 0922     Coagulation Profile: No results for input(s): "INR", "PROTIME" in the last 168 hours.  Cardiac Enzymes: No results for input(s): "CKTOTAL", "CKMB", "CKMBINDEX", "TROPONINI" in the last 168 hours.  H   CBG: Recent Labs  Lab 02/24/22 1122  GLUCAP 247*

## 2022-02-24 NOTE — Progress Notes (Signed)
PROGRESS NOTE    Christopher Booth  FWY:[id number] DOB: 11-01-1940 DOA: 02/21/2022 PCP: Susy Frizzle, MD   Brief Narrative:    Christopher Booth is a 82 y.o. male with medical history significant of coronary artery disease, essential hypertension, hyperaldosteronism, lumbar radiculopathy, and more presents the ED with a chief complaint of dyspnea.  Of note patient was recently admitted on January 21 and discharged on January 23 for the same complaint.  At that time he was diagnosed with acute respiratory failure with hypoxia requiring 2 L nasal cannula secondary to COVID virus and also pneumonia of the left lower lobe.  Patient apparently was not taking his oral Augmentin as prescribed on discharge and there is concern for bacterial pneumonia.  He has been readmitted for acute hypoxemic respiratory failure in the setting of recent COVID infection.  Pulmonology consulted 1/30 to assist with management.  Assessment & Plan:   Principal Problem:   Acute respiratory failure with hypoxia (HCC) Active Problems:   Essential hypertension, benign   Mixed hyperlipidemia   Sepsis (HCC)   Multifocal pneumonia   COVID-19 virus infection   Gastroesophageal reflux disease   Hyponatremia   Hypocalcemia  Assessment and Plan:  Acute hypoxemic respiratory failure likely in the setting of recent COVID-19 infection with possible ALI Obtain ABG No PE noted Chest x-ray and CT imaging demonstrating findings of ongoing COVID-19 infection/acute lung injury that has been associated Procalcitonin level low and therefore will discontinue antibiotics White cell count likely in relation to use of steroids Continue breathing treatments and steroids Pulmicort twice daily ordered Will likely require oxygen on discharge No prior history of tobacco abuse and patient has had COVID vaccinations Appreciate pulmonology consultation as patient continues to have very high oxygen requirement Wean oxygen down to 10L HFNC  today with goal pulse ox 88-92%, encouraged prone positioning;   COVID-19 viral infection Continue Solu-Medrol as he had COVID on prior admission Inflammatory markers trending down  Hypocalcemia Corrected with low albumin  Hyponatremia-stable Likely in the setting of respiratory illness Continue IV normal saline  GERD PPI  Dyslipidemia Statin  Hypertension Continue home medications and monitor   Steroid-induced hyperglycemia - add SSI and prandial coverage with frequent CBG monitoring    DVT prophylaxis:Heparin Code Status: Full Family Communication: None at bedside Disposition Plan:  Status is: Inpatient Remains inpatient appropriate because: Need for IV medications.  Consultants:  Pulmonology  Procedures:  None  Antimicrobials:  Anti-infectives (From admission, onward)    Start     Dose/Rate Route Frequency Ordered Stop   02/22/22 2000  vancomycin (VANCOREADY) IVPB 1250 mg/250 mL  Status:  Discontinued        1,250 mg 166.7 mL/hr over 90 Minutes Intravenous Every 24 hours 02/21/22 1931 02/22/22 0835   02/21/22 2000  ceFEPIme (MAXIPIME) 2 g in sodium chloride 0.9 % 100 mL IVPB  Status:  Discontinued        2 g 200 mL/hr over 30 Minutes Intravenous Every 12 hours 02/21/22 1931 02/22/22 0835   02/21/22 1945  vancomycin (VANCOREADY) IVPB 1500 mg/300 mL        1,500 mg 150 mL/hr over 120 Minutes Intravenous  Once 02/21/22 1931 02/21/22 2203      Subjective: Patient wants to ambulate more today.    Objective: Vitals:   02/24/22 0759 02/24/22 0800 02/24/22 0900 02/24/22 1000  BP:  133/61  (!) 150/109  Pulse: 69 68 70 71  Resp: (!) 27 16 (!) 21 (!) 25  Temp:  TempSrc:      SpO2: (!) 87% (!) 89% 91% 94%  Weight:      Height:        Intake/Output Summary (Last 24 hours) at 02/24/2022 1041 Last data filed at 02/24/2022 0914 Gross per 24 hour  Intake 240 ml  Output 1000 ml  Net -760 ml   Filed Weights   02/21/22 1610 02/21/22 2100  Weight: 77.1  kg 76 kg    Examination:  General exam: Appears calm and comfortable, speaking clear full sentences.  Respiratory system: good air movement bilateral, rales heard. No crackles heard. Cardiovascular system: normal S1 & S2 heard, RRR.  Gastrointestinal system: Abdomen is soft, ND/NT, no HSM, normal BS Central nervous system: Alert and awake no focal findings.  Extremities: No C/C/E.   Skin: No significant lesions noted Psychiatry: normal mood.  Data Reviewed: I have personally reviewed following labs and imaging studies  CBC: Recent Labs  Lab 02/21/22 1642 02/22/22 0414 02/23/22 0444 02/24/22 0430  WBC 16.6* 14.4* 15.5* 17.0*  NEUTROABS 13.7* 13.3* 13.7* 15.4*  HGB 15.4 14.2 13.8 13.5  HCT 44.7 40.9 39.8 39.7  MCV 86.8 86.8 86.3 87.8  PLT 407* 390 409* 237*   Basic Metabolic Panel: Recent Labs  Lab 02/21/22 1642 02/22/22 0414 02/23/22 0444 02/24/22 0430  NA 129* 130* 131* 134*  K 3.9 4.0 4.3 4.9  CL 96* 100 100 103  CO2 21* 19* 22 25  GLUCOSE 113* 180* 281* 214*  BUN 30* 28* 30* 29*  CREATININE 1.05 0.91 0.95 0.87  CALCIUM 7.9* 7.8* 7.8* 8.2*  MG  --  2.0 2.4 2.5*   GFR: Estimated Creatinine Clearance: 60.1 mL/min (by C-G formula based on SCr of 0.87 mg/dL). Liver Function Tests: Recent Labs  Lab 02/21/22 1642 02/22/22 0414 02/23/22 0444 02/24/22 0430  AST 57* 51* 32 30  ALT 69* 70* 56* 59*  ALKPHOS 77 77 73 72  BILITOT 1.5* 1.6* 1.0 1.1  PROT 7.1 6.7 6.1* 6.1*  ALBUMIN 3.0* 2.7* 2.6* 2.6*   No results for input(s): "LIPASE", "AMYLASE" in the last 168 hours. No results for input(s): "AMMONIA" in the last 168 hours. Coagulation Profile: No results for input(s): "INR", "PROTIME" in the last 168 hours. Cardiac Enzymes: No results for input(s): "CKTOTAL", "CKMB", "CKMBINDEX", "TROPONINI" in the last 168 hours. BNP (last 3 results) No results for input(s): "PROBNP" in the last 8760 hours. HbA1C: No results for input(s): "HGBA1C" in the last 72  hours. CBG: No results for input(s): "GLUCAP" in the last 168 hours. Lipid Profile: No results for input(s): "CHOL", "HDL", "LDLCALC", "TRIG", "CHOLHDL", "LDLDIRECT" in the last 72 hours. Thyroid Function Tests: No results for input(s): "TSH", "T4TOTAL", "FREET4", "T3FREE", "THYROIDAB" in the last 72 hours. Anemia Panel: Recent Labs    02/23/22 0444 02/24/22 0430  FERRITIN 928* 816*   Sepsis Labs: Recent Labs  Lab 02/21/22 1642  PROCALCITON 0.19    Recent Results (from the past 240 hour(s))  MRSA Next Gen by PCR, Nasal     Status: None   Collection Time: 02/14/22  6:30 PM   Specimen: Nasal Mucosa; Nasal Swab  Result Value Ref Range Status   MRSA by PCR Next Gen NOT DETECTED NOT DETECTED Final    Comment: (NOTE) The GeneXpert MRSA Assay (FDA approved for NASAL specimens only), is one component of a comprehensive MRSA colonization surveillance program. It is not intended to diagnose MRSA infection nor to guide or monitor treatment for MRSA infections. Test performance is not FDA  approved in patients less than 66 years old. Performed at [facility], [address], [place], [place] [address]   Expectorated Sputum Assessment w Gram Stain, Rflx to Resp Cult     Status: None   Collection Time: 02/22/22 11:36 PM   Specimen: Sputum  Result Value Ref Range Status   Specimen Description SPUTUM  Final   Special Requests NONE  Final   Sputum evaluation   Final    Sputum specimen not acceptable for testing.  Please recollect.   NOTIFIED TINAJERO,E'@0734'$  BY MATTHEWS, B 1.30.2024 Performed at [facility], [address], [place], [place] [address]    Report Status 02/23/2022 FINAL  Final  MRSA Next Gen by PCR, Nasal     Status: None   Collection Time: 02/23/22  9:54 AM   Specimen: Nasal Mucosa; Nasal Swab  Result Value Ref Range Status   MRSA by PCR Next Gen NOT DETECTED NOT DETECTED Final    Comment: (NOTE) The GeneXpert MRSA Assay (FDA approved for NASAL specimens only), is one  component of a comprehensive MRSA colonization surveillance program. It is not intended to diagnose MRSA infection nor to guide or monitor treatment for MRSA infections. Test performance is not FDA approved in patients less than 9 years old. Performed at [facility], [address] [place], [place] [address]     Radiology Studies: DG CHEST PORT 1 VIEW  Result Date: 02/24/2022 CLINICAL DATA:  Acute respiratory failure. Recent diagnosis of COVID-19. EXAM: PORTABLE CHEST 1 VIEW COMPARISON:  Single-view of the chest and CT chest 02/21/2022. Single-view of the chest 02/15/2022. FINDINGS: Again seen is hazy, patchy bilateral airspace disease which appears slightly worse compared to the most recent exam. No pneumothorax or pleural fluid. Heart size is normal. The patient is status post CABG. Aortic atherosclerosis is noted. IMPRESSION: Hazy bilateral airspace disease consistent with pneumonia persists and appears slightly worse compared to the most recent exam. Electronically Signed   By: Inge Rise M.D.   On: 02/24/2022 08:11        Scheduled Meds:  ascorbic acid  500 mg Oral Daily   aspirin EC  81 mg Oral Daily   atorvastatin  20 mg Oral QHS   budesonide (PULMICORT) nebulizer solution  0.25 mg Nebulization BID   carvedilol  12.5 mg Oral BID WC   Chlorhexidine Gluconate Cloth  6 each Topical Q0600   heparin  5,000 Units Subcutaneous Q8H   ipratropium-albuterol  3 mL Nebulization TID   losartan  25 mg Oral Daily   methylPREDNISolone (SOLU-MEDROL) injection  80 mg Intravenous Q12H   multivitamin with minerals  1 tablet Oral Daily   pantoprazole  40 mg Oral BID AC   spironolactone  25 mg Oral Daily   zinc sulfate  220 mg Oral Daily   Continuous Infusions:  sodium chloride 75 mL/hr at 02/22/22 0439    LOS: 3 days   Time spent: 35 minutes  Reighan Hipolito Wynetta Emery, MD [facility]  If 7PM-7AM, please contact night-coverage [email or web address] 02/24/2022, 10:41 AM

## 2022-02-24 NOTE — Progress Notes (Signed)
NAME:  Christopher Booth, MRN:  [id number], DOB:  April 23, 1940, LOS: 3 ADMISSION DATE:  02/21/2022, CONSULTATION DATE:  1/30 REFERRING MD:  Manuella Ghazi, CHIEF COMPLAINT:  post covid resp failure   History of Present Illness:  24 yowm never smoker s/p vax  2 in 2021 for covid s/p covid 19 onset of symptom 02/06/22  admit Jan 21-23 and d/c on tapering prednisone and augmentin for possible superimposed bacteial Pna LL (but apparently never took it) > readmit 1/28 with resp failure   Pertinent  Medical History  HBP with hyperaldosteronism IHD Lumbar radiculopathy       Significant Hospital Events: Including procedures, antibiotic start and stop dates in addition to other pertinent events   PCT 1/28  0.19  (vs 15.54 1/22)  Cta 1/28  Chest CTa    1/28 Patchy ground-glass opacities bilaterally  No evidence of pulmonary emboli. Vanc/ceph/zmax rx 1/28 only     Scheduled Meds:  ascorbic acid  500 mg Oral Daily   aspirin EC  81 mg Oral Daily   atorvastatin  20 mg Oral QHS   budesonide (PULMICORT) nebulizer solution  0.25 mg Nebulization BID   carvedilol  12.5 mg Oral BID WC   Chlorhexidine Gluconate Cloth  6 each Topical Q0600   heparin  5,000 Units Subcutaneous Q8H   insulin aspart  0-15 Units Subcutaneous TID WC   insulin aspart  5 Units Subcutaneous TID WC   ipratropium-albuterol  3 mL Nebulization TID   losartan  25 mg Oral Daily   methylPREDNISolone (SOLU-MEDROL) injection  80 mg Intravenous Q12H   multivitamin with minerals  1 tablet Oral Daily   pantoprazole  40 mg Oral BID AC   spironolactone  25 mg Oral Daily   zinc sulfate  220 mg Oral Daily   Continuous Infusions:  sodium chloride 55 mL/hr at 02/24/22 1045   PRN Meds:.acetaminophen **OR** acetaminophen, albuterol, chlorpheniramine-HYDROcodone, guaiFENesin-dextromethorphan, morphine injection, ondansetron **OR** ondansetron (ZOFRAN) IV, oxyCODONE    Interim History / Subjective:  No new complaints / wants to get up in chair    Objective   Blood pressure (!) 150/109, pulse 71, temperature (!) 97.5 F (36.4 C), temperature source Oral, resp. rate (!) 25, height '5\' 6"'$  (1.676 m), weight 76 kg, SpO2 94 %.        Intake/Output Summary (Last 24 hours) at 02/24/2022 1302 Last data filed at 02/24/2022 0914 Gross per 24 hour  Intake 240 ml  Output 1000 ml  Net -760 ml   Filed Weights   02/21/22 1610 02/21/22 2100  Weight: 77.1 kg 76 kg    Examination: Tmax:  98.6 General appearance:    pleasant wm sitting up at 60 degrees/ min dry cough  At Rest 02 sats  92% on 10 lpm   No jvd Oropharynx clear,  mucosa nl Neck supple Lungs withinsp crackles L>R base with cough on deep insp RRR no s3 or or sign murmur Abd soft/ benign with nl  excursion  Extr warm with no edema or clubbing noted Neuro  Sensorium intact ,  no apparent motor deficits    I personally reviewed images and  impression as follows:  CXR:   portable 1/31 ? Faint air bronchograms LLL/ overall minimal change   Assessment & Plan:  1) Acute resp failure/ hypoxemia s/p covid 19 infection in nl host (x for age 82)  complicated initially by suspected bact pna (supported by high PCT) but now mostly post-inflammatory GG changes likely Fibroproliferative in nature and may  take a while to respond to steroids and likely relapse if not tapered very slowly  -  CRP  1/30  7.7  vs 15.1  x 9 days prior -  Ferritin  1/30 = 928 -  ESR   1/30  = 21  With MRSA screen neg and nl PCT ok to continue off abx     Rec > check LHD/ESR am 21 / no change rx in meantime     Labs   CBC: Recent Labs  Lab 02/21/22 1642 02/22/22 0414 02/23/22 0444 02/24/22 0430  WBC 16.6* 14.4* 15.5* 17.0*  NEUTROABS 13.7* 13.3* 13.7* 15.4*  HGB 15.4 14.2 13.8 13.5  HCT 44.7 40.9 39.8 39.7  MCV 86.8 86.8 86.3 87.8  PLT 407* 390 409* 403*    Basic Metabolic Panel: Recent Labs  Lab 02/21/22 1642 02/22/22 0414 02/23/22 0444 02/24/22 0430  NA 129* 130* 131* 134*  K 3.9  4.0 4.3 4.9  CL 96* 100 100 103  CO2 21* 19* 22 25  GLUCOSE 113* 180* 281* 214*  BUN 30* 28* 30* 29*  CREATININE 1.05 0.91 0.95 0.87  CALCIUM 7.9* 7.8* 7.8* 8.2*  MG  --  2.0 2.4 2.5*   GFR: Estimated Creatinine Clearance: 60.1 mL/min (by C-G formula based on SCr of 0.87 mg/dL). Recent Labs  Lab 02/21/22 1642 02/22/22 0414 02/23/22 0444 02/24/22 0430  PROCALCITON 0.19  --   --   --   WBC 16.6* 14.4* 15.5* 17.0*    Liver Function Tests: Recent Labs  Lab 02/21/22 1642 02/22/22 0414 02/23/22 0444 02/24/22 0430  AST 57* 51* 32 30  ALT 69* 70* 56* 59*  ALKPHOS 77 77 73 72  BILITOT 1.5* 1.6* 1.0 1.1  PROT 7.1 6.7 6.1* 6.1*  ALBUMIN 3.0* 2.7* 2.6* 2.6*   No results for input(s): "LIPASE", "AMYLASE" in the last 168 hours. No results for input(s): "AMMONIA" in the last 168 hours.  ABG    Component Value Date/Time   PHART 7.48 (H) 02/23/2022 0922   PCO2ART 28 (L) 02/23/2022 0922   PO2ART 61 (L) 02/23/2022 0922   HCO3 20.9 02/23/2022 0922   TCO2 22 01/13/2017 1610   ACIDBASEDEF 1.4 02/23/2022 0922   O2SAT 93 02/23/2022 0922     Coagulation Profile: No results for input(s): "INR", "PROTIME" in the last 168 hours.  Cardiac Enzymes: No results for input(s): "CKTOTAL", "CKMB", "CKMBINDEX", "TROPONINI" in the last 168 hours.  H   CBG: Recent Labs  Lab 02/24/22 1122  GLUCAP 247*

## 2022-02-25 ENCOUNTER — Inpatient Hospital Stay: Payer: PPO | Admitting: Family Medicine

## 2022-02-25 DIAGNOSIS — I10 Essential (primary) hypertension: Secondary | ICD-10-CM | POA: Diagnosis not present

## 2022-02-25 DIAGNOSIS — E782 Mixed hyperlipidemia: Secondary | ICD-10-CM | POA: Diagnosis not present

## 2022-02-25 DIAGNOSIS — J9601 Acute respiratory failure with hypoxia: Secondary | ICD-10-CM | POA: Diagnosis not present

## 2022-02-25 DIAGNOSIS — J189 Pneumonia, unspecified organism: Secondary | ICD-10-CM | POA: Diagnosis not present

## 2022-02-25 LAB — SEDIMENTATION RATE: Sed Rate: 8 mm/hr (ref 0–16)

## 2022-02-25 LAB — COMPREHENSIVE METABOLIC PANEL
ALT: 64 U/L — ABNORMAL HIGH (ref 0–44)
AST: 31 U/L (ref 15–41)
Albumin: 2.4 g/dL — ABNORMAL LOW (ref 3.5–5.0)
Alkaline Phosphatase: 76 U/L (ref 38–126)
Anion gap: 7 (ref 5–15)
BUN: 28 mg/dL — ABNORMAL HIGH (ref 8–23)
CO2: 22 mmol/L (ref 22–32)
Calcium: 7.8 mg/dL — ABNORMAL LOW (ref 8.9–10.3)
Chloride: 102 mmol/L (ref 98–111)
Creatinine, Ser: 0.88 mg/dL (ref 0.61–1.24)
GFR, Estimated: >60 mL/min (ref >60)
Glucose, Bld: 192 mg/dL — ABNORMAL HIGH (ref 70–99)
Potassium: 4.1 mmol/L (ref 3.5–5.1)
Sodium: 131 mmol/L — ABNORMAL LOW (ref 135–145)
Total Bilirubin: 0.8 mg/dL (ref 0.3–1.2)
Total Protein: 5.8 g/dL — ABNORMAL LOW (ref 6.5–8.1)

## 2022-02-25 LAB — GLUCOSE, CAPILLARY
Glucose-Capillary: 192 mg/dL — ABNORMAL HIGH (ref 70–99)
Glucose-Capillary: 173 mg/dL — ABNORMAL HIGH (ref 70–99)
Glucose-Capillary: 204 mg/dL — ABNORMAL HIGH (ref 70–99)
Glucose-Capillary: 138 mg/dL — ABNORMAL HIGH (ref 70–99)
Glucose-Capillary: 170 mg/dL — ABNORMAL HIGH (ref 70–99)

## 2022-02-25 LAB — LACTATE DEHYDROGENASE: LDH: 255 U/L — ABNORMAL HIGH (ref 98–192)

## 2022-02-25 NOTE — Progress Notes (Signed)
PROGRESS NOTE    Christopher Booth  EUM:[id number] DOB: 04/17/1940 DOA: 02/21/2022 PCP: Susy Frizzle, MD   Brief Narrative:    Christopher Booth is a 82 y.o. male with medical history significant of coronary artery disease, essential hypertension, hyperaldosteronism, lumbar radiculopathy, and more presents the ED with a chief complaint of dyspnea.  Of note patient was recently admitted on January 21 and discharged on January 23 for the same complaint.  At that time he was diagnosed with acute respiratory failure with hypoxia requiring 2 L nasal cannula secondary to COVID virus and also pneumonia of the left lower lobe.  Patient apparently was not taking his oral Augmentin as prescribed on discharge and there is concern for bacterial pneumonia.  He has been readmitted for acute hypoxemic respiratory failure in the setting of recent COVID infection.  Pulmonology consulted 1/30 to assist with management.  Assessment & Plan:   Principal Problem:   Acute respiratory failure with hypoxia (HCC) Active Problems:   Essential hypertension, benign   Mixed hyperlipidemia   Sepsis (HCC)   Multifocal pneumonia   COVID-19 virus infection   Gastroesophageal reflux disease   Hyponatremia   Hypocalcemia  Assessment and Plan:  Acute hypoxemic respiratory failure likely in the setting of recent COVID-19 infection with possible ALI Obtain ABG No PE noted Chest x-ray and CT imaging demonstrating findings of ongoing COVID-19 infection/acute lung injury that has been associated Procalcitonin level low and therefore will discontinue antibiotics White cell count likely in relation to use of steroids Continue breathing treatments and steroids Pulmicort twice daily ordered Will likely require oxygen on discharge No prior history of tobacco abuse and patient has had COVID vaccinations Appreciate pulmonology consultation as patient continues to have very high oxygen requirement Our goal is to wean oxygen down  to 10L HFNC with goal pulse ox 88-92%, encouraged prone positioning;  Pulmonologist has been consulted  COVID-19 viral infection Continue Solu-Medrol as he had COVID on prior admission Inflammatory markers trending down  Hypocalcemia Corrected with low albumin  Hyponatremia-stable Likely in the setting of respiratory illness Continue IV normal saline  GERD PPI  Dyslipidemia Statin  Hypertension Continue home medications and monitor  Steroid-induced hyperglycemia - added SSI and prandial coverage with frequent CBG monitoring  CBG (last 3)  Recent Labs    02/25/22 0746 02/25/22 1125 02/25/22 1608  GLUCAP 173* 204* 138*    DVT prophylaxis:Heparin Code Status: Full Family Communication: t/c to daughter  Disposition Plan:  Status is: Inpatient Remains inpatient appropriate because: Need for IV medications.  Consultants:  Pulmonology  Procedures:  None  Antimicrobials:  Anti-infectives (From admission, onward)    Start     Dose/Rate Route Frequency Ordered Stop   02/22/22 2000  vancomycin (VANCOREADY) IVPB 1250 mg/250 mL  Status:  Discontinued        1,250 mg 166.7 mL/hr over 90 Minutes Intravenous Every 24 hours 02/21/22 1931 02/22/22 0835   02/21/22 2000  ceFEPIme (MAXIPIME) 2 g in sodium chloride 0.9 % 100 mL IVPB  Status:  Discontinued        2 g 200 mL/hr over 30 Minutes Intravenous Every 12 hours 02/21/22 1931 02/22/22 0835   02/21/22 1945  vancomycin (VANCOREADY) IVPB 1500 mg/300 mL        1,500 mg 150 mL/hr over 120 Minutes Intravenous  Once 02/21/22 1931 02/21/22 2203      Subjective: Patient still wants to ambulate more, he is SOB but it is not causing him distress.  He has a light cough, no chest pain.      Objective: Vitals:   02/25/22 1402 02/25/22 1429 02/25/22 1600 02/25/22 1610  BP:   (!) 109/59   Pulse: 72 70 64   Resp: (!) 22 (!) 23 (!) 22   Temp:    98 F (36.7 C)  TempSrc:    Oral  SpO2: 90% 94% 94%   Weight:      Height:         Intake/Output Summary (Last 24 hours) at 02/25/2022 1642 Last data filed at 02/25/2022 1449 Gross per 24 hour  Intake 1370.47 ml  Output 825 ml  Net 545.47 ml   Filed Weights   02/21/22 1610 02/21/22 2100  Weight: 77.1 kg 76 kg    Examination:  General exam: Appears calm and comfortable, speaking clear full sentences.  Respiratory system: good air movement bilateral, rales heard. No crackles heard. Cardiovascular system: normal S1 & S2 heard, RRR.  Gastrointestinal system: Abdomen is soft, ND/NT, no HSM, normal BS Central nervous system: Alert and awake no focal findings.  Extremities: No C/C/E.   Skin: No significant lesions noted Psychiatry: normal mood.  Data Reviewed: I have personally reviewed following labs and imaging studies  CBC: Recent Labs  Lab 02/21/22 1642 02/22/22 0414 02/23/22 0444 02/24/22 0430  WBC 16.6* 14.4* 15.5* 17.0*  NEUTROABS 13.7* 13.3* 13.7* 15.4*  HGB 15.4 14.2 13.8 13.5  HCT 44.7 40.9 39.8 39.7  MCV 86.8 86.8 86.3 87.8  PLT 407* 390 409* 950*   Basic Metabolic Panel: Recent Labs  Lab 02/21/22 1642 02/22/22 0414 02/23/22 0444 02/24/22 0430 02/25/22 0432  NA 129* 130* 131* 134* 131*  K 3.9 4.0 4.3 4.9 4.1  CL 96* 100 100 103 102  CO2 21* 19* '22 25 22  '$ GLUCOSE 113* 180* 281* 214* 192*  BUN 30* 28* 30* 29* 28*  CREATININE 1.05 0.91 0.95 0.87 0.88  CALCIUM 7.9* 7.8* 7.8* 8.2* 7.8*  MG  --  2.0 2.4 2.5*  --    GFR: Estimated Creatinine Clearance: 59.4 mL/min (by C-G formula based on SCr of 0.88 mg/dL). Liver Function Tests: Recent Labs  Lab 02/21/22 1642 02/22/22 0414 02/23/22 0444 02/24/22 0430 02/25/22 0432  AST 57* 51* 32 30 31  ALT 69* 70* 56* 59* 64*  ALKPHOS 77 77 73 72 76  BILITOT 1.5* 1.6* 1.0 1.1 0.8  PROT 7.1 6.7 6.1* 6.1* 5.8*  ALBUMIN 3.0* 2.7* 2.6* 2.6* 2.4*   No results for input(s): "LIPASE", "AMYLASE" in the last 168 hours. No results for input(s): "AMMONIA" in the last 168 hours. Coagulation  Profile: No results for input(s): "INR", "PROTIME" in the last 168 hours. Cardiac Enzymes: No results for input(s): "CKTOTAL", "CKMB", "CKMBINDEX", "TROPONINI" in the last 168 hours. BNP (last 3 results) No results for input(s): "PROBNP" in the last 8760 hours. HbA1C: Recent Labs    02/24/22 1130  HGBA1C 6.7*   CBG: Recent Labs  Lab 02/24/22 1617 02/25/22 0317 02/25/22 0746 02/25/22 1125 02/25/22 1608  GLUCAP 263* 192* 173* 204* 138*   Lipid Profile: No results for input(s): "CHOL", "HDL", "LDLCALC", "TRIG", "CHOLHDL", "LDLDIRECT" in the last 72 hours. Thyroid Function Tests: No results for input(s): "TSH", "T4TOTAL", "FREET4", "T3FREE", "THYROIDAB" in the last 72 hours. Anemia Panel: Recent Labs    02/23/22 0444 02/24/22 0430  FERRITIN 928* 816*   Sepsis Labs: Recent Labs  Lab 02/21/22 1642  PROCALCITON 0.19    Recent Results (from the past 240 hour(s))  Expectorated  Sputum Assessment w Gram Stain, Rflx to Resp Cult     Status: None   Collection Time: 02/22/22 11:36 PM   Specimen: Sputum  Result Value Ref Range Status   Specimen Description SPUTUM  Final   Special Requests NONE  Final   Sputum evaluation   Final    Sputum specimen not acceptable for testing.  Please recollect.   NOTIFIED TINAJERO,E'@0734'$  BY MATTHEWS, B 1.30.2024 Performed at [facility], [address], [place], [place] [address]    Report Status 02/23/2022 FINAL  Final  MRSA Next Gen by PCR, Nasal     Status: None   Collection Time: 02/23/22  9:54 AM   Specimen: Nasal Mucosa; Nasal Swab  Result Value Ref Range Status   MRSA by PCR Next Gen NOT DETECTED NOT DETECTED Final    Comment: (NOTE) The GeneXpert MRSA Assay (FDA approved for NASAL specimens only), is one component of a comprehensive MRSA colonization surveillance program. It is not intended to diagnose MRSA infection nor to guide or monitor treatment for MRSA infections. Test performance is not FDA approved in patients less than 36  years old. Performed at [facility], [address], [place], [place] [address]     Radiology Studies: DG CHEST PORT 1 VIEW  Result Date: 02/24/2022 CLINICAL DATA:  Acute respiratory failure. Recent diagnosis of COVID-19. EXAM: PORTABLE CHEST 1 VIEW COMPARISON:  Single-view of the chest and CT chest 02/21/2022. Single-view of the chest 02/15/2022. FINDINGS: Again seen is hazy, patchy bilateral airspace disease which appears slightly worse compared to the most recent exam. No pneumothorax or pleural fluid. Heart size is normal. The patient is status post CABG. Aortic atherosclerosis is noted. IMPRESSION: Hazy bilateral airspace disease consistent with pneumonia persists and appears slightly worse compared to the most recent exam. Electronically Signed   By: Inge Rise M.D.   On: 02/24/2022 08:11     Scheduled Meds:  ascorbic acid  500 mg Oral Daily   aspirin EC  81 mg Oral Daily   atorvastatin  20 mg Oral QHS   budesonide (PULMICORT) nebulizer solution  0.25 mg Nebulization BID   carvedilol  12.5 mg Oral BID WC   Chlorhexidine Gluconate Cloth  6 each Topical Q0600   heparin  5,000 Units Subcutaneous Q8H   insulin aspart  0-15 Units Subcutaneous TID WC   insulin aspart  5 Units Subcutaneous TID WC   ipratropium-albuterol  3 mL Nebulization TID   losartan  25 mg Oral Daily   methylPREDNISolone (SOLU-MEDROL) injection  80 mg Intravenous Q12H   multivitamin with minerals  1 tablet Oral Daily   pantoprazole  40 mg Oral BID AC   spironolactone  25 mg Oral Daily   zinc sulfate  220 mg Oral Daily   Continuous Infusions:  sodium chloride 55 mL/hr at 02/25/22 1449    LOS: 4 days   Time spent: 61 minutes  Jniya Madara, MD [facility]  If 7PM-7AM, please contact night-coverage [email or web address] 02/25/2022, 4:42 PM

## 2022-02-25 NOTE — Evaluation (Signed)
Physical Therapy Evaluation Patient Details Name: Christopher Booth MRN: [id number] DOB: Jan 19, 1941 Today's Date: 02/25/2022  History of Present Illness  Christopher Booth is a 82 y.o. male with medical history significant of coronary artery disease, essential hypertension, hyperaldosteronism, lumbar radiculopathy, and more presents the ED with a chief complaint of dyspnea.  Of note patient was recently admitted on January 21 and discharged on January 23 for the same complaint.  At that time he was diagnosed with acute respiratory failure with hypoxia requiring 2 L nasal cannula secondary to COVID virus and also pneumonia of the left lower lobe.  Patient completed course of baricitinib in the hospital and was discharged on 6 days of Augmentin.  Patient was not aware of his Augmentin prescription and therefore has not been taking it.  He reports about 3 days ago he started having worsening dyspnea.  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  He reports he continues to have a nonproductive cough.  He continues to have diarrhea, as he did in the last hospitalization, but it has improved.  He reports he is not having diarrhea 2-3 times a day.  It is nonbloody.  He is exertional dyspnea and generalized weakness.  He denies chest pain and wheeze.  He was discharged with an inhaler and he reports that it does not help at all.  He had no fevers.  He denies any leg swelling but does have some right calf tenderness.  He has myalgias of both legs.  Has had no appetite and has not been eating very much.  He reports he did have a protein drink today, but is about all he will take in 1 day.  He denies abdominal pain, nausea, vomiting.  Patient reports today when he checked his oxygen sat it was 62 and he knew he needed to come into the ED.  Please pick up by EMS they put him on nonrebreather.  His oxygen sats were in the high 80s in the ED.  He did improve with 15 L heated high  flow.  Admission was requested for respiratory failure with hypoxia.  Patient has no further complaints at this time.   Clinical Impression  Patient limited for functional mobility as stated below secondary to BLE weakness, fatigue and poor standing balance. Patient with O2 sat on 14L HFNC from high 70s to mid 90s throughout session. Patient with quick fatigue with all mobility but is eager to participate. He demonstrates good sitting tolerance and sitting balance at EOB. He requires assist to transfer to standing and for labored steps to chair due to weakness and R foot pain. He performs seated exercises in chair and is educated on continuing to perform. Patient will benefit from continued physical therapy in hospital and recommended venue below to increase strength, balance, endurance for safe ADLs and gait.        Recommendations for follow up therapy are one component of a multi-disciplinary discharge planning process, led by the attending physician.  Recommendations may be updated based on patient status, additional functional criteria and insurance authorization.  Follow Up Recommendations Home health PT      Assistance Recommended at Discharge Intermittent Supervision/Assistance  Patient can return home with the following  A little help with walking and/or transfers;A little help with bathing/dressing/bathroom    Equipment Recommendations None recommended by PT  Recommendations for Other Services       Functional Status Assessment Patient has had  a recent decline in their functional status and demonstrates the ability to make significant improvements in function in a reasonable and predictable amount of time.     Precautions / Restrictions Precautions Precautions: Fall Restrictions Weight Bearing Restrictions: No      Mobility  Bed Mobility Overal bed mobility: Needs Assistance Bed Mobility: Supine to Sit     Supine to sit: Min assist, HOB elevated     General bed  mobility comments: assist to pull to seated EOB    Transfers Overall transfer level: Needs assistance Equipment used: 1 person hand held assist Transfers: Sit to/from Stand, Bed to chair/wheelchair/BSC Sit to Stand: Min assist   Step pivot transfers: Min assist       General transfer comment: labored    Ambulation/Gait Ambulation/Gait assistance: Min assist, Mod assist Gait Distance (Feet): 2 Feet Assistive device: 1 person hand held assist Gait Pattern/deviations: Shuffle, Antalgic Gait velocity: decreased     General Gait Details: R foot pain causing limited weightbearing on R, HHA and UE on chair arm for support  Stairs            Wheelchair Mobility    Modified Rankin (Stroke Patients Only)       Balance Overall balance assessment: Mild deficits observed, not formally tested                                           Pertinent Vitals/Pain Pain Assessment Pain Assessment: No/denies pain    Home Living Family/patient expects to be discharged to:: Private residence Living Arrangements: Spouse/significant other Available Help at Discharge: Family Type of Home: House Home Access: Stairs to enter Entrance Stairs-Rails: Can reach both Entrance Stairs-Number of Steps: 7   Home Layout: Two level;Able to live on main level with bedroom/bathroom Home Equipment: Kasandra Knudsen - quad      Prior Function Prior Level of Function : Independent/Modified Independent             Mobility Comments: patient states community ambulation with R AFO ADLs Comments: independent     Hand Dominance        Extremity/Trunk Assessment   Upper Extremity Assessment Upper Extremity Assessment: Generalized weakness    Lower Extremity Assessment Lower Extremity Assessment: Generalized weakness    Cervical / Trunk Assessment Cervical / Trunk Assessment: Normal  Communication   Communication: No difficulties  Cognition Arousal/Alertness:  Awake/alert Behavior During Therapy: WFL for tasks assessed/performed Overall Cognitive Status: Within Functional Limits for tasks assessed                                          General Comments      Exercises General Exercises - Lower Extremity Ankle Circles/Pumps: AROM, AAROM, 20 reps, Seated Long Arc Quad: AROM, Both, 15 reps, Seated Hip Flexion/Marching: AROM, Both, 15 reps, Seated   Assessment/Plan    PT Assessment Patient needs continued PT services  PT Problem List Decreased strength;Decreased activity tolerance;Decreased balance;Decreased mobility;Cardiopulmonary status limiting activity       PT Treatment Interventions DME instruction;Therapeutic exercise;Gait training;Balance training;Stair training;Neuromuscular re-education;Functional mobility training;Therapeutic activities;Patient/family education    PT Goals (Current goals can be found in the Care Plan section)  Acute Rehab PT Goals Patient Stated Goal: return home PT Goal Formulation: With patient Time For Goal Achievement:  03/11/22 Potential to Achieve Goals: Good    Frequency Min 3X/week     Co-evaluation               AM-PAC PT "6 Clicks" Mobility  Outcome Measure Help needed turning from your back to your side while in a flat bed without using bedrails?: A Little Help needed moving from lying on your back to sitting on the side of a flat bed without using bedrails?: A Little Help needed moving to and from a bed to a chair (including a wheelchair)?: A Lot Help needed standing up from a chair using your arms (e.g., wheelchair or bedside chair)?: A Lot Help needed to walk in hospital room?: A Lot Help needed climbing 3-5 steps with a railing? : A Lot 6 Click Score: 14    End of Session Equipment Utilized During Treatment: Oxygen Activity Tolerance: Patient tolerated treatment well;Patient limited by fatigue Patient left: in chair;with call bell/phone within reach Nurse  Communication: Mobility status PT Visit Diagnosis: Unsteadiness on feet (R26.81);Other abnormalities of gait and mobility (R26.89);Muscle weakness (generalized) (M62.81)    Time: 1325-1400 PT Time Calculation (min) (ACUTE ONLY): 35 min   Charges:   PT Evaluation $PT Eval Moderate Complexity: 1 Mod PT Treatments $Therapeutic Activity: 23-37 mins        2:21 PM, 02/25/22 Mearl Latin PT, DPT Physical Therapist at [place] 02/25/2022, 2:20 PM

## 2022-02-26 DIAGNOSIS — J9601 Acute respiratory failure with hypoxia: Secondary | ICD-10-CM | POA: Diagnosis not present

## 2022-02-26 DIAGNOSIS — U071 COVID-19: Secondary | ICD-10-CM | POA: Diagnosis not present

## 2022-02-26 DIAGNOSIS — J189 Pneumonia, unspecified organism: Secondary | ICD-10-CM | POA: Diagnosis not present

## 2022-02-26 LAB — GLUCOSE, CAPILLARY
Glucose-Capillary: 183 mg/dL — ABNORMAL HIGH (ref 70–99)
Glucose-Capillary: 196 mg/dL — ABNORMAL HIGH (ref 70–99)
Glucose-Capillary: 220 mg/dL — ABNORMAL HIGH (ref 70–99)
Glucose-Capillary: 243 mg/dL — ABNORMAL HIGH (ref 70–99)
Glucose-Capillary: 167 mg/dL — ABNORMAL HIGH (ref 70–99)

## 2022-02-26 LAB — COMPREHENSIVE METABOLIC PANEL
ALT: 60 U/L — ABNORMAL HIGH (ref 0–44)
AST: 26 U/L (ref 15–41)
Albumin: 2.3 g/dL — ABNORMAL LOW (ref 3.5–5.0)
Alkaline Phosphatase: 77 U/L (ref 38–126)
Anion gap: 10 (ref 5–15)
BUN: 26 mg/dL — ABNORMAL HIGH (ref 8–23)
CO2: 23 mmol/L (ref 22–32)
Calcium: 7.6 mg/dL — ABNORMAL LOW (ref 8.9–10.3)
Chloride: 102 mmol/L (ref 98–111)
Creatinine, Ser: 0.86 mg/dL (ref 0.61–1.24)
GFR, Estimated: >60 mL/min (ref >60)
Glucose, Bld: 199 mg/dL — ABNORMAL HIGH (ref 70–99)
Potassium: 4.4 mmol/L (ref 3.5–5.1)
Sodium: 135 mmol/L (ref 135–145)
Total Bilirubin: 0.8 mg/dL (ref 0.3–1.2)
Total Protein: 5.7 g/dL — ABNORMAL LOW (ref 6.5–8.1)

## 2022-02-26 NOTE — Progress Notes (Signed)
Pt new to floor this shift, vitals obtained per protocol. O2 sats showed 70-80% on 5L, o2 adjusted to 8L, sats then showed 84%. Respiratory called by this Probation officer. Respiratory placed pt on HFNC 8L, o2 probe also applied to L ear and pt hooked up to continuous o2 monitoring. O2 sats now 94%, pt still on 8L HFNC. Respiratory to follow up as well as oncoming nurse. Plan of care ongoing.

## 2022-02-26 NOTE — Progress Notes (Signed)
PROGRESS NOTE    Christopher Booth  MPN:[id number] DOB: [birth year] DOA: 02/21/2022 PCP: Susy Frizzle, MD   Brief Narrative:    Christopher Booth is a 82 y.o. male with medical history significant of coronary artery disease, essential hypertension, hyperaldosteronism, lumbar radiculopathy, and more presents the ED with a chief complaint of dyspnea.  Of note patient was recently admitted on January 21 and discharged on January 23 for the same complaint.  At that time he was diagnosed with acute respiratory failure with hypoxia requiring 2 L nasal cannula secondary to COVID virus and also pneumonia of the left lower lobe.  Patient apparently was not taking his oral Augmentin as prescribed on discharge and there is concern for bacterial pneumonia.  He has been readmitted for acute hypoxemic respiratory failure in the setting of recent COVID infection.  Pulmonology consulted 1/30 to assist with management.  Assessment & Plan:   Principal Problem:   Acute respiratory failure with hypoxia (HCC) Active Problems:   Essential hypertension, benign   Mixed hyperlipidemia   Sepsis (HCC)   Multifocal pneumonia   COVID-19 virus infection   Gastroesophageal reflux disease   Hyponatremia   Hypocalcemia  Assessment and Plan:  Acute hypoxemic respiratory failure likely in the setting of recent COVID-19 infection with possible ALI Obtain ABG No PE noted Chest x-ray and CT imaging demonstrating findings of ongoing COVID-19 infection/acute lung injury that has been associated Procalcitonin level low and therefore will discontinue antibiotics White cell count likely in relation to use of steroids Continue breathing treatments and steroids Pulmicort twice daily ordered Will require oxygen on discharge No prior history of tobacco abuse and patient has had COVID vaccinations Appreciate pulmonology consultation as patient continues to have very high oxygen requirement Our goal is to wean oxygen down to 6L  HFNC with goal pulse ox 88-92%, encouraged prone positioning;  Pulmonologist has been consulted; will have a long steroid taper per pulmonary to avoid flare up of symptoms   COVID-19 viral infection Continue Solu-Medrol as he had COVID on prior admission Inflammatory markers trending down Weaning down oxygen as able, with goal to get him down to 6L or less of oxygen to go home  Hypocalcemia Corrected with low albumin  Hyponatremia-stable Likely in the setting of respiratory illness Continue IV normal saline  GERD PPI  Dyslipidemia Statin  Hypertension Continue home medications and monitor  Steroid-induced hyperglycemia - added SSI and prandial coverage with frequent CBG monitoring  CBG (last 3)  Recent Labs    02/26/22 0730 02/26/22 1136 02/26/22 1549  GLUCAP 196* 220* 243*    DVT prophylaxis:Heparin Code Status: Full Family Communication: t/c to daughter 2/1 Disposition Plan:  Status is: Inpatient Remains inpatient appropriate because: Need for IV medications.  Consultants:  Pulmonology  Procedures:  None  Antimicrobials:  Anti-infectives (From admission, onward)    Start     Dose/Rate Route Frequency Ordered Stop   02/22/22 2000  vancomycin (VANCOREADY) IVPB 1250 mg/250 mL  Status:  Discontinued        1,250 mg 166.7 mL/hr over 90 Minutes Intravenous Every 24 hours 02/21/22 1931 02/22/22 0835   02/21/22 2000  ceFEPIme (MAXIPIME) 2 g in sodium chloride 0.9 % 100 mL IVPB  Status:  Discontinued        2 g 200 mL/hr over 30 Minutes Intravenous Every 12 hours 02/21/22 1931 02/22/22 0835   02/21/22 1945  vancomycin (VANCOREADY) IVPB 1500 mg/300 mL        1,500 mg 150  mL/hr over 120 Minutes Intravenous  Once 02/21/22 1931 02/21/22 2203      Subjective: Patient felt better after working with PT yesterday.  His breathing feels about the same.  No chest pain.        Objective: Vitals:   02/26/22 1341 02/26/22 1400 02/26/22 1417 02/26/22 1500  BP:  115/63   (!) 120/59  Pulse: 77 80 74 81  Resp: (!) 22 (!) 21 19 (!) 22  Temp:      TempSrc:      SpO2: (!) 88% 94% 96% 90%  Weight:      Height:        Intake/Output Summary (Last 24 hours) at 02/26/2022 1557 Last data filed at 02/26/2022 1300 Gross per 24 hour  Intake 1580.85 ml  Output 800 ml  Net 780.85 ml   Filed Weights   02/21/22 1610 02/21/22 2100  Weight: 77.1 kg 76 kg    Examination:  General exam: Appears calm and comfortable, speaking clear full sentences.  Respiratory system: good air movement bilateral, rales heard. No crackles heard. Cardiovascular system: normal S1 & S2 heard, RRR.  Gastrointestinal system: Abdomen is soft, ND/NT, no HSM, normal BS Central nervous system: Alert and awake no focal findings.  Extremities: No C/C/E.   Skin: No significant lesions noted Psychiatry: normal mood.  Data Reviewed: I have personally reviewed following labs and imaging studies  CBC: Recent Labs  Lab 02/21/22 1642 02/22/22 0414 02/23/22 0444 02/24/22 0430  WBC 16.6* 14.4* 15.5* 17.0*  NEUTROABS 13.7* 13.3* 13.7* 15.4*  HGB 15.4 14.2 13.8 13.5  HCT 44.7 40.9 39.8 39.7  MCV 86.8 86.8 86.3 87.8  PLT 407* 390 409* 599*   Basic Metabolic Panel: Recent Labs  Lab 02/22/22 0414 02/23/22 0444 02/24/22 0430 02/25/22 0432 02/26/22 0432  NA 130* 131* 134* 131* 135  K 4.0 4.3 4.9 4.1 4.4  CL 100 100 103 102 102  CO2 19* '22 25 22 23  '$ GLUCOSE 180* 281* 214* 192* 199*  BUN 28* 30* 29* 28* 26*  CREATININE 0.91 0.95 0.87 0.88 0.86  CALCIUM 7.8* 7.8* 8.2* 7.8* 7.6*  MG 2.0 2.4 2.5*  --   --    GFR: Estimated Creatinine Clearance: 60.8 mL/min (by C-G formula based on SCr of 0.86 mg/dL). Liver Function Tests: Recent Labs  Lab 02/22/22 0414 02/23/22 0444 02/24/22 0430 02/25/22 0432 02/26/22 0432  AST 51* 32 '30 31 26  '$ ALT 70* 56* 59* 64* 60*  ALKPHOS 77 73 72 76 77  BILITOT 1.6* 1.0 1.1 0.8 0.8  PROT 6.7 6.1* 6.1* 5.8* 5.7*  ALBUMIN 2.7* 2.6* 2.6* 2.4* 2.3*   No  results for input(s): "LIPASE", "AMYLASE" in the last 168 hours. No results for input(s): "AMMONIA" in the last 168 hours. Coagulation Profile: No results for input(s): "INR", "PROTIME" in the last 168 hours. Cardiac Enzymes: No results for input(s): "CKTOTAL", "CKMB", "CKMBINDEX", "TROPONINI" in the last 168 hours. BNP (last 3 results) No results for input(s): "PROBNP" in the last 8760 hours. HbA1C: Recent Labs    02/24/22 1130  HGBA1C 6.7*   CBG: Recent Labs  Lab 02/25/22 1957 02/26/22 0253 02/26/22 0730 02/26/22 1136 02/26/22 1549  GLUCAP 170* 183* 196* 220* 243*   Lipid Profile: No results for input(s): "CHOL", "HDL", "LDLCALC", "TRIG", "CHOLHDL", "LDLDIRECT" in the last 72 hours. Thyroid Function Tests: No results for input(s): "TSH", "T4TOTAL", "FREET4", "T3FREE", "THYROIDAB" in the last 72 hours. Anemia Panel: Recent Labs    02/24/22 0430  FERRITIN 816*  Sepsis Labs: Recent Labs  Lab 02/21/22 1642  PROCALCITON 0.19    Recent Results (from the past 240 hour(s))  Expectorated Sputum Assessment w Gram Stain, Rflx to Resp Cult     Status: None   Collection Time: 02/22/22 11:36 PM   Specimen: Sputum  Result Value Ref Range Status   Specimen Description SPUTUM  Final   Special Requests NONE  Final   Sputum evaluation   Final    Sputum specimen not acceptable for testing.  Please recollect.   NOTIFIED TINAJERO,E'@0734'$  BY MATTHEWS, B 1.30.2024 Performed at [facility], [address], [place], [place] [address]    Report Status 02/23/2022 FINAL  Final  MRSA Next Gen by PCR, Nasal     Status: None   Collection Time: 02/23/22  9:54 AM   Specimen: Nasal Mucosa; Nasal Swab  Result Value Ref Range Status   MRSA by PCR Next Gen NOT DETECTED NOT DETECTED Final    Comment: (NOTE) The GeneXpert MRSA Assay (FDA approved for NASAL specimens only), is one component of a comprehensive MRSA colonization surveillance program. It is not intended to diagnose MRSA infection  nor to guide or monitor treatment for MRSA infections. Test performance is not FDA approved in patients less than 34 years old. Performed at [facility], [address], [place], [place] [address]     Radiology Studies: No results found.   Scheduled Meds:  ascorbic acid  500 mg Oral Daily   aspirin EC  81 mg Oral Daily   atorvastatin  20 mg Oral QHS   budesonide (PULMICORT) nebulizer solution  0.25 mg Nebulization BID   carvedilol  12.5 mg Oral BID WC   Chlorhexidine Gluconate Cloth  6 each Topical Q0600   heparin  5,000 Units Subcutaneous Q8H   insulin aspart  0-15 Units Subcutaneous TID WC   insulin aspart  5 Units Subcutaneous TID WC   ipratropium-albuterol  3 mL Nebulization TID   losartan  25 mg Oral Daily   methylPREDNISolone (SOLU-MEDROL) injection  80 mg Intravenous Q12H   multivitamin with minerals  1 tablet Oral Daily   pantoprazole  40 mg Oral BID AC   spironolactone  25 mg Oral Daily   zinc sulfate  220 mg Oral Daily   Continuous Infusions:  sodium chloride 55 mL/hr at 02/26/22 1215    LOS: 5 days   Time spent: 35 minutes  Huyen Perazzo, MD [facility]  If 7PM-7AM, please contact night-coverage [email or web address] 02/26/2022, 3:57 PM

## 2022-02-26 NOTE — Progress Notes (Signed)
Report called and given to [facility], LPN on Dept. 747.

## 2022-02-26 NOTE — TOC Progression Note (Signed)
Transition of Care [facility]) - Progression Note    Patient Details  Name: Christopher Booth MRN: [id number] Date of Birth: 1940-04-19  Transition of Care [facility]) CM/SW Contact  Salome Arnt, [place] Phone Number: 02/26/2022, 11:00 AM  Clinical Narrative:  LCSW discussed recommendation for HHPT with pt. He is agreeable and has no preference on agency. Referred and accepted by Lattie Haw with [facility]. Will need orders. Pt will be in hospital through weekend per MD. TOC will continue to follow.          Expected Discharge Plan and Services       Living arrangements for the past 2 months: [place]: PT [place] Agency: Other - See comment ([facility]) Date HH Agency Contacted: 02/26/22 Time HH Agency Contacted: 1100 Representative spoke with at [place]: [place] Determinants of Health ([place]) Interventions SDOH Screenings   Food Insecurity: No Food Insecurity (02/21/2022)  Housing: Low Risk  (02/21/2022)  Transportation Needs: No Transportation Needs (02/21/2022)  Utilities: Not At Risk (02/21/2022)  Alcohol Screen: Low Risk  (10/30/2020)  Depression (PHQ2-9): Low Risk  (11/12/2021)  Financial Resource Strain: Low Risk  (10/30/2020)  Physical Activity: Insufficiently Active (10/30/2020)  Social Connections: Socially Integrated (10/30/2020)  Stress: No Stress Concern Present (10/30/2020)  Tobacco Use: Low Risk  (02/21/2022)    Readmission Risk Interventions     No data to display

## 2022-02-26 NOTE — Progress Notes (Signed)
Physical Therapy Treatment Patient Details Name: Christopher Booth MRN: [id number] DOB: October 20, 1940 Today's Date: 02/26/2022   History of Present Illness Christopher Booth is a 82 y.o. male with medical history significant of coronary artery disease, essential hypertension, hyperaldosteronism, lumbar radiculopathy, and more presents the ED with a chief complaint of dyspnea.  Of note patient was recently admitted on January 21 and discharged on January 23 for the same complaint.  At that time he was diagnosed with acute respiratory failure with hypoxia requiring 2 L nasal cannula secondary to COVID virus and also pneumonia of the left lower lobe.  Patient completed course of baricitinib in the hospital and was discharged on 6 days of Augmentin.  Patient was not aware of his Augmentin prescription and therefore has not been taking it.  He reports about 3 days ago he started having worsening dyspnea.  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  He reports he continues to have a nonproductive cough.  He continues to have diarrhea, as he did in the last hospitalization, but it has improved.  He reports he is not having diarrhea 2-3 times a day.  It is nonbloody.  He is exertional dyspnea and generalized weakness.  He denies chest pain and wheeze.  He was discharged with an inhaler and he reports that it does not help at all.  He had no fevers.  He denies any leg swelling but does have some right calf tenderness.  He has myalgias of both legs.  Has had no appetite and has not been eating very much.  He reports he did have a protein drink today, but is about all he will take in 1 day.  He denies abdominal pain, nausea, vomiting.  Patient reports today when he checked his oxygen sat it was 62 and he knew he needed to come into the ED.  Please pick up by EMS they put him on nonrebreather.  His oxygen sats were in the high 80s in the ED.  He did improve with 15 L heated high  flow.  Admission was requested for respiratory failure with hypoxia.  Patient has no further complaints at this time.    PT Comments    Patient demonstrates increased endurance/distance for taking steps forward/backward at bedside using his Quad cane, wearing right AFO and shoes without loss of balance, limited mostly due to fatigue, SOB with SpO2 dropping from 94% to 75% while on 6 LPM O2.  Patient tolerated sitting up in chair after therapy with SpO2 at 94% while on 6 LPM O2 - RN notified.  Patient will benefit from continued skilled physical therapy in hospital and recommended venue below to increase strength, balance, endurance for safe ADLs and gait.     Recommendations for follow up therapy are one component of a multi-disciplinary discharge planning process, led by the attending physician.  Recommendations may be updated based on patient status, additional functional criteria and insurance authorization.  Follow Up Recommendations  Home health PT     Assistance Recommended at Discharge Set up Supervision/Assistance  Patient can return home with the following A little help with walking and/or transfers;A little help with bathing/dressing/bathroom   Equipment Recommendations  None recommended by PT    Recommendations for Other Services       Precautions / Restrictions Precautions Precautions: Fall Restrictions Weight Bearing Restrictions: No     Mobility  Bed Mobility Overal bed mobility: Needs Assistance Bed Mobility: Supine  to Sit     Supine to sit: Supervision     General bed mobility comments: required verbal cues for propping up on elbows to hand instead of using bed rail with fair/good return demonstrated    Transfers Overall transfer level: Needs assistance Equipment used: 1 person hand held assist, Quad cane Transfers: Sit to/from Stand, Bed to chair/wheelchair/BSC Sit to Stand: Supervision   Step pivot transfers: Supervision, Min guard       General  transfer comment: increased time, slightly labored movement    Ambulation/Gait Ambulation/Gait assistance: Min guard, Supervision Gait Distance (Feet): 15 Feet Assistive device: Quad cane Gait Pattern/deviations: Decreased step length - left, Decreased stance time - right, Decreased stride length, Step-to pattern Gait velocity: decreased     General Gait Details: slightly increased endurance/distance for taking steps at bedside using his quad-cane, limited mostly due to fatigue, SOB with SpO2 dropping from 95% to 75% on 6 LPM   Stairs             Wheelchair Mobility    Modified Rankin (Stroke Patients Only)       Balance Overall balance assessment: Needs assistance Sitting-balance support: Feet supported, No upper extremity supported Sitting balance-Leahy Scale: Good Sitting balance - Comments: seated at EOB                                    Cognition Arousal/Alertness: Awake/alert Behavior During Therapy: WFL for tasks assessed/performed Overall Cognitive Status: Within Functional Limits for tasks assessed                                          Exercises General Exercises - Lower Extremity Long Arc Quad: Seated, AROM, Strengthening, Both, 10 reps Hip Flexion/Marching: Seated, AROM, Strengthening, Both, 10 reps Toe Raises: Seated, AROM, Strengthening, Both Heel Raises: Seated, AROM, Strengthening, Both, 10 reps    General Comments        Pertinent Vitals/Pain Pain Assessment Pain Assessment: No/denies pain    Home Living                          Prior Function            PT Goals (current goals can now be found in the care plan section) Acute Rehab PT Goals Patient Stated Goal: return home PT Goal Formulation: With patient Time For Goal Achievement: 03/11/22 Potential to Achieve Goals: Good Progress towards PT goals: Progressing toward goals    Frequency    Min 3X/week      PT Plan Current  plan remains appropriate    Co-evaluation              AM-PAC PT "6 Clicks" Mobility   Outcome Measure  Help needed turning from your back to your side while in a flat bed without using bedrails?: None Help needed moving from lying on your back to sitting on the side of a flat bed without using bedrails?: A Little Help needed moving to and from a bed to a chair (including a wheelchair)?: A Little Help needed standing up from a chair using your arms (e.g., wheelchair or bedside chair)?: A Little Help needed to walk in hospital room?: A Little Help needed climbing 3-5 steps with a railing? : A Lot 6 Click Score:  18    End of Session Equipment Utilized During Treatment: Oxygen Activity Tolerance: Patient tolerated treatment well;Patient limited by fatigue Patient left: in chair;with call bell/phone within reach Nurse Communication: Mobility status PT Visit Diagnosis: Unsteadiness on feet (R26.81);Other abnormalities of gait and mobility (R26.89);Muscle weakness (generalized) (M62.81)     Time: 8887-5797 PT Time Calculation (min) (ACUTE ONLY): 31 min  Charges:  $Therapeutic Exercise: 8-22 mins $Therapeutic Activity: 8-22 mins                     12:36 PM, 02/26/22 Lonell Grandchild, MPT Physical Therapist with [facility] [phone] office [phone] mobile phone

## 2022-02-27 DIAGNOSIS — U071 COVID-19: Secondary | ICD-10-CM | POA: Diagnosis not present

## 2022-02-27 DIAGNOSIS — J9601 Acute respiratory failure with hypoxia: Secondary | ICD-10-CM | POA: Diagnosis not present

## 2022-02-27 DIAGNOSIS — J189 Pneumonia, unspecified organism: Secondary | ICD-10-CM | POA: Diagnosis not present

## 2022-02-27 LAB — COMPREHENSIVE METABOLIC PANEL
ALT: 54 U/L — ABNORMAL HIGH (ref 0–44)
AST: 25 U/L (ref 15–41)
Albumin: 2.3 g/dL — ABNORMAL LOW (ref 3.5–5.0)
Alkaline Phosphatase: 83 U/L (ref 38–126)
Anion gap: 7 (ref 5–15)
BUN: 29 mg/dL — ABNORMAL HIGH (ref 8–23)
CO2: 22 mmol/L (ref 22–32)
Calcium: 7.6 mg/dL — ABNORMAL LOW (ref 8.9–10.3)
Chloride: 107 mmol/L (ref 98–111)
Creatinine, Ser: 0.87 mg/dL (ref 0.61–1.24)
GFR, Estimated: >60 mL/min (ref >60)
Glucose, Bld: 221 mg/dL — ABNORMAL HIGH (ref 70–99)
Potassium: 3.9 mmol/L (ref 3.5–5.1)
Sodium: 136 mmol/L (ref 135–145)
Total Bilirubin: 1.2 mg/dL (ref 0.3–1.2)
Total Protein: 5.5 g/dL — ABNORMAL LOW (ref 6.5–8.1)

## 2022-02-27 LAB — GLUCOSE, CAPILLARY
Glucose-Capillary: 208 mg/dL — ABNORMAL HIGH (ref 70–99)
Glucose-Capillary: 195 mg/dL — ABNORMAL HIGH (ref 70–99)
Glucose-Capillary: 270 mg/dL — ABNORMAL HIGH (ref 70–99)
Glucose-Capillary: 208 mg/dL — ABNORMAL HIGH (ref 70–99)

## 2022-02-27 LAB — PROLACTIN: Prolactin: 12.8 ng/mL (ref 3.6–32.0)

## 2022-02-27 NOTE — Progress Notes (Signed)
Patient alert and verbal. Reports no complaints of pain/discomfort. Ambulated to and from bed and chair with assistance. Tolerated medications whole with no complaints. Continues to report shortness of breath with exertion.

## 2022-02-27 NOTE — Progress Notes (Signed)
PROGRESS NOTE    Christopher Booth  FWY:[id number] DOB: August 06, 1940 DOA: 02/21/2022 PCP: Susy Frizzle, MD   Brief Narrative:    Christopher Booth is a 82 y.o. male with medical history significant of coronary artery disease, essential hypertension, hyperaldosteronism, lumbar radiculopathy, and more presents the ED with a chief complaint of dyspnea.  Of note patient was recently admitted on January 21 and discharged on January 23 for the same complaint.  At that time he was diagnosed with acute respiratory failure with hypoxia requiring 2 L nasal cannula secondary to COVID virus and also pneumonia of the left lower lobe.  Patient apparently was not taking his oral Augmentin as prescribed on discharge and there is concern for bacterial pneumonia.  He has been readmitted for acute hypoxemic respiratory failure in the setting of recent COVID infection.  Pulmonology consulted 1/30 to assist with management.  Assessment & Plan:   Principal Problem:   Acute respiratory failure with hypoxia (HCC) Active Problems:   Essential hypertension, benign   Mixed hyperlipidemia   Sepsis (HCC)   Multifocal pneumonia   COVID-19 virus infection   Gastroesophageal reflux disease   Hyponatremia   Hypocalcemia  Assessment and Plan:  Acute hypoxemic respiratory failure likely in the setting of recent COVID-19 infection with possible ALI Obtain ABG No PE noted Chest x-ray and CT imaging demonstrating findings of ongoing COVID-19 infection/acute lung injury that has been associated Procalcitonin level low and therefore will discontinue antibiotics White cell count likely in relation to use of steroids Continue breathing treatments and steroids Pulmicort twice daily ordered Will require oxygen on discharge No prior history of tobacco abuse and patient has had COVID vaccinations Appreciate pulmonology consultation as patient continues to have very high oxygen requirement Our goal is to wean oxygen down to 6L  [place] with goal pulse ox 88-92%, encouraged prone positioning;  Pulmonologist has been consulted; will have a long steroid taper per pulmonary to avoid flare up of symptoms  Pt weaned down to 5L [place] today; if he tolerates this we likely can discharge him home 2/4 with long steroid taper   COVID-19 viral infection Continue Solu-Medrol as he had COVID on prior admission Inflammatory markers trending down Weaning down oxygen as able, with goal to get him down to 6L or less of oxygen to go home  Hypocalcemia Corrected with low albumin  Hyponatremia-stable Likely in the setting of respiratory illness Continue IV normal saline  GERD PPI  Dyslipidemia Statin  Hypertension Continue home medications and monitor  Steroid-induced hyperglycemia - added SSI and prandial coverage with frequent CBG monitoring  CBG (last 3)  Recent Labs    02/27/22 0740 02/27/22 1121 02/27/22 1604  GLUCAP 208* 195* 270*    DVT prophylaxis:Heparin Code Status: Full Family Communication: t/c to daughter 2/1 Disposition Plan:  Status is: Inpatient Remains inpatient appropriate because: Need for IV medications.  Consultants:  Pulmonology  Procedures:  None  Antimicrobials:  Anti-infectives (From admission, onward)    Start     Dose/Rate Route Frequency Ordered Stop   02/22/22 2000  vancomycin (VANCOREADY) IVPB 1250 mg/250 mL  Status:  Discontinued        1,250 mg 166.7 mL/hr over 90 Minutes Intravenous Every 24 hours 02/21/22 1931 02/22/22 0835   02/21/22 2000  ceFEPIme (MAXIPIME) 2 g in sodium chloride 0.9 % 100 mL IVPB  Status:  Discontinued        2 g 200 mL/hr over 30 Minutes Intravenous Every 12 hours 02/21/22 1931  02/22/22 0835   02/21/22 1945  vancomycin (VANCOREADY) IVPB 1500 mg/300 mL        1,500 mg 150 mL/hr over 120 Minutes Intravenous  Once 02/21/22 1931 02/21/22 2203      Subjective: Patient ambulated in room and feels better, no SOB. He gets tired with ambulation but recovers  well with rest.         Objective: Vitals:   02/27/22 0908 02/27/22 1319 02/27/22 1426 02/27/22 1729  BP: (!) 110/57  (!) 100/54 115/62  Pulse: 75  77 70  Resp:   20   Temp:   (!) 97.5 F (36.4 C)   TempSrc:   Oral   SpO2:  96% 95%   Weight:      Height:        Intake/Output Summary (Last 24 hours) at 02/27/2022 1800 Last data filed at 02/27/2022 0757 Gross per 24 hour  Intake --  Output 400 ml  Net -400 ml   Filed Weights   02/21/22 1610 02/21/22 2100  Weight: 77.1 kg 76 kg    Examination:  General exam: Appears calm and comfortable, speaking clear full sentences.  Respiratory system: good air movement bilateral, No rales heard. No crackles heard. Cardiovascular system: normal S1 & S2 heard, RRR.  Gastrointestinal system: Abdomen is soft, ND/NT, no HSM, normal BS Central nervous system: Alert and awake no focal findings.  Extremities: No C/C/E.   Skin: No significant lesions noted Psychiatry: normal mood.  Data Reviewed: I have personally reviewed following labs and imaging studies  CBC: Recent Labs  Lab 02/21/22 1642 02/22/22 0414 02/23/22 0444 02/24/22 0430  WBC 16.6* 14.4* 15.5* 17.0*  NEUTROABS 13.7* 13.3* 13.7* 15.4*  HGB 15.4 14.2 13.8 13.5  HCT 44.7 40.9 39.8 39.7  MCV 86.8 86.8 86.3 87.8  PLT 407* 390 409* 280*   Basic Metabolic Panel: Recent Labs  Lab 02/22/22 0414 02/23/22 0444 02/24/22 0430 02/25/22 0432 02/26/22 0432 02/27/22 0504  NA 130* 131* 134* 131* 135 136  K 4.0 4.3 4.9 4.1 4.4 3.9  CL 100 100 103 102 102 107  CO2 19* '22 25 22 23 22  '$ GLUCOSE 180* 281* 214* 192* 199* 221*  BUN 28* 30* 29* 28* 26* 29*  CREATININE 0.91 0.95 0.87 0.88 0.86 0.87  CALCIUM 7.8* 7.8* 8.2* 7.8* 7.6* 7.6*  MG 2.0 2.4 2.5*  --   --   --    GFR: Estimated Creatinine Clearance: 60.1 mL/min (by C-G formula based on SCr of 0.87 mg/dL). Liver Function Tests: Recent Labs  Lab 02/23/22 0444 02/24/22 0430 02/25/22 0432 02/26/22 0432 02/27/22 0504  AST  32 '30 31 26 25  '$ ALT 56* 59* 64* 60* 54*  ALKPHOS 73 72 76 77 83  BILITOT 1.0 1.1 0.8 0.8 1.2  PROT 6.1* 6.1* 5.8* 5.7* 5.5*  ALBUMIN 2.6* 2.6* 2.4* 2.3* 2.3*   No results for input(s): "LIPASE", "AMYLASE" in the last 168 hours. No results for input(s): "AMMONIA" in the last 168 hours. Coagulation Profile: No results for input(s): "INR", "PROTIME" in the last 168 hours. Cardiac Enzymes: No results for input(s): "CKTOTAL", "CKMB", "CKMBINDEX", "TROPONINI" in the last 168 hours. BNP (last 3 results) No results for input(s): "PROBNP" in the last 8760 hours. HbA1C: No results for input(s): "HGBA1C" in the last 72 hours.  CBG: Recent Labs  Lab 02/26/22 1549 02/26/22 2134 02/27/22 0740 02/27/22 1121 02/27/22 1604  GLUCAP 243* 167* 208* 195* 270*   Lipid Profile: No results for input(s): "CHOL", "HDL", "LDLCALC", "  TRIG", "CHOLHDL", "LDLDIRECT" in the last 72 hours. Thyroid Function Tests: No results for input(s): "TSH", "T4TOTAL", "FREET4", "T3FREE", "THYROIDAB" in the last 72 hours. Anemia Panel: No results for input(s): "VITAMINB12", "FOLATE", "FERRITIN", "TIBC", "IRON", "RETICCTPCT" in the last 72 hours.  Sepsis Labs: Recent Labs  Lab 02/21/22 1642  PROCALCITON 0.19    Recent Results (from the past 240 hour(s))  Expectorated Sputum Assessment w Gram Stain, Rflx to Resp Cult     Status: None   Collection Time: 02/22/22 11:36 PM   Specimen: Sputum  Result Value Ref Range Status   Specimen Description SPUTUM  Final   Special Requests NONE  Final   Sputum evaluation   Final    Sputum specimen not acceptable for testing.  Please recollect.   NOTIFIED TINAJERO,E'@0734'$  BY MATTHEWS, B 1.30.2024 Performed at [facility], [address], [place], [place] [address]    Report Status 02/23/2022 FINAL  Final  MRSA Next Gen by PCR, Nasal     Status: None   Collection Time: 02/23/22  9:54 AM   Specimen: Nasal Mucosa; Nasal Swab  Result Value Ref Range Status   MRSA by PCR Next Gen  NOT DETECTED NOT DETECTED Final    Comment: (NOTE) The GeneXpert MRSA Assay (FDA approved for NASAL specimens only), is one component of a comprehensive MRSA colonization surveillance program. It is not intended to diagnose MRSA infection nor to guide or monitor treatment for MRSA infections. Test performance is not FDA approved in patients less than 74 years old. Performed at [facility], [address] [place], [place] [address]     Radiology Studies: No results found.   Scheduled Meds:  ascorbic acid  500 mg Oral Daily   aspirin EC  81 mg Oral Daily   atorvastatin  20 mg Oral QHS   budesonide (PULMICORT) nebulizer solution  0.25 mg Nebulization BID   carvedilol  12.5 mg Oral BID WC   Chlorhexidine Gluconate Cloth  6 each Topical Q0600   heparin  5,000 Units Subcutaneous Q8H   insulin aspart  0-15 Units Subcutaneous TID WC   insulin aspart  5 Units Subcutaneous TID WC   ipratropium-albuterol  3 mL Nebulization TID   losartan  25 mg Oral Daily   methylPREDNISolone (SOLU-MEDROL) injection  80 mg Intravenous Q12H   multivitamin with minerals  1 tablet Oral Daily   pantoprazole  40 mg Oral BID AC   spironolactone  25 mg Oral Daily   zinc sulfate  220 mg Oral Daily   Continuous Infusions:   LOS: 6 days   Time spent: 35 minutes  Christopher Joaquin, MD [facility]  If 7PM-7AM, please contact night-coverage [email or web address] 02/27/2022, 6:00 PM

## 2022-02-28 ENCOUNTER — Encounter (HOSPITAL_COMMUNITY): Payer: Self-pay | Admitting: Family Medicine

## 2022-02-28 DIAGNOSIS — E871 Hypo-osmolality and hyponatremia: Secondary | ICD-10-CM | POA: Diagnosis not present

## 2022-02-28 DIAGNOSIS — U071 COVID-19: Secondary | ICD-10-CM | POA: Diagnosis not present

## 2022-02-28 DIAGNOSIS — J9601 Acute respiratory failure with hypoxia: Secondary | ICD-10-CM | POA: Diagnosis not present

## 2022-02-28 DIAGNOSIS — J189 Pneumonia, unspecified organism: Secondary | ICD-10-CM | POA: Diagnosis not present

## 2022-02-28 LAB — GLUCOSE, CAPILLARY
Glucose-Capillary: 199 mg/dL — ABNORMAL HIGH (ref 70–99)
Glucose-Capillary: 212 mg/dL — ABNORMAL HIGH (ref 70–99)
Glucose-Capillary: 237 mg/dL — ABNORMAL HIGH (ref 70–99)
Glucose-Capillary: 182 mg/dL — ABNORMAL HIGH (ref 70–99)

## 2022-02-28 MED ORDER — PANTOPRAZOLE SODIUM 40 MG PO TBEC: 40.0000 mg | DELAYED_RELEASE_TABLET | Freq: Two times a day (BID) | ORAL | 1 refills | Status: AC

## 2022-02-28 MED ORDER — PREDNISONE 10 MG PO TABS: ORAL_TABLET | ORAL | 0 refills | Status: AC

## 2022-02-28 MED ORDER — METHYLPREDNISOLONE SODIUM SUCC 125 MG IJ SOLR
80.0000 mg | Freq: Every day | INTRAMUSCULAR | Status: DC
  Administered 2022-02-28: 80 mg via INTRAVENOUS

## 2022-02-28 MED ORDER — GUAIFENESIN-DM 100-10 MG/5ML PO SYRP: 10.0000 mL | ORAL_SOLUTION | ORAL | 0 refills | Status: AC | PRN

## 2022-02-28 MED ORDER — METHYLPREDNISOLONE SODIUM SUCC 40 MG IJ SOLR: 40.0000 mg | Freq: Two times a day (BID) | INTRAMUSCULAR | Status: DC

## 2022-02-28 MED ORDER — LINAGLIPTIN 5 MG PO TABS: 5.0000 mg | ORAL_TABLET | Freq: Every day | ORAL | 0 refills | Status: AC

## 2022-02-28 MED ORDER — IPRATROPIUM-ALBUTEROL 0.5-2.5 (3) MG/3ML IN SOLN: 3.0000 mL | Freq: Three times a day (TID) | RESPIRATORY_TRACT | 2 refills | Status: AC

## 2022-02-28 NOTE — Discharge Instructions (Signed)
IMPORTANT INFORMATION: PAY CLOSE ATTENTION   PHYSICIAN DISCHARGE INSTRUCTIONS  Follow with Primary care provider  Pickard, Warren T, MD  and other consultants as instructed by your Hospitalist Physician  SEEK MEDICAL CARE OR RETURN TO EMERGENCY ROOM IF SYMPTOMS COME BACK, WORSEN OR NEW PROBLEM DEVELOPS   Please note: You were cared for by a hospitalist during your hospital stay. Every effort will be made to forward records to your primary care provider.  You can request that your primary care provider send for your hospital records if they have not received them.  Once you are discharged, your primary care physician will handle any further medical issues. Please note that NO REFILLS for any discharge medications will be authorized once you are discharged, as it is imperative that you return to your primary care physician (or establish a relationship with a primary care physician if you do not have one) for your post hospital discharge needs so that they can reassess your need for medications and monitor your lab values.  Please get a complete blood count and chemistry panel checked by your Primary MD at your next visit, and again as instructed by your Primary MD.  Get Medicines reviewed and adjusted: Please take all your medications with you for your next visit with your Primary MD  Laboratory/radiological data: Please request your Primary MD to go over all hospital tests and procedure/radiological results at the follow up, please ask your primary care provider to get all Hospital records sent to his/her office.  In some cases, they will be blood work, cultures and biopsy results pending at the time of your discharge. Please request that your primary care provider follow up on these results.  If you are diabetic, please bring your blood sugar readings with you to your follow up appointment with primary care.    Please call and make your follow up appointments as soon as possible.    Also  Note the following: If you experience worsening of your admission symptoms, develop shortness of breath, life threatening emergency, suicidal or homicidal thoughts you must seek medical attention immediately by calling 911 or calling your MD immediately  if symptoms less severe.  You must read complete instructions/literature along with all the possible adverse reactions/side effects for all the Medicines you take and that have been prescribed to you. Take any new Medicines after you have completely understood and accpet all the possible adverse reactions/side effects.   Do not drive when taking Pain medications or sleeping medications (Benzodiazepines)  Do not take more than prescribed Pain, Sleep and Anxiety Medications. It is not advisable to combine anxiety,sleep and pain medications without talking with your primary care practitioner  Special Instructions: If you have smoked or chewed Tobacco  in the last 2 yrs please stop smoking, stop any regular Alcohol  and or any Recreational drug use.  Wear Seat belts while driving.  Do not drive if taking any narcotic, mind altering or controlled substances or recreational drugs or alcohol.       

## 2022-02-28 NOTE — Progress Notes (Signed)
No c/o pain, pt slept through the night with respirations even and unlabored. Vitals stable. New IV placed in R hand.

## 2022-02-28 NOTE — Progress Notes (Signed)
SATURATION QUALIFICATIONS: (This note is used to comply with regulatory documentation for home oxygen)  Patient Saturations on  5 Liters HFNC at Rest = 92%  Patient Saturations on 5 Liters HFNC of oxygen while Ambulating = 82%  Please briefly explain why patient needs home oxygen: Patient unable to tolerate being placed on RA. Patients oxygen saturation on 5 Liters HFNC sitting was 92%, while ambulating oxygen saturation dropped to 82% on 5 liters HFNC placed patient on 7 liters HFNC oxygen saturation increased to 84%. Patient unable to tolerate ambulating any longer due to increased shortness of breath. MD Wynetta Emery made aware.

## 2022-02-28 NOTE — Discharge Summary (Signed)
Physician Discharge Summary  Christopher Booth:[id number] DOB: 10-11-1940 DOA: 02/21/2022  PCP: Susy Frizzle, MD  Admit date: 02/21/2022 Discharge date: 02/28/2022  Admitted From:  Home  Disposition: Home with home health   Recommendations for Outpatient Follow-up:  Follow up with PCP in 2 weeks Follow up with [facility] in 3 weeks   Home Health:  PT, RN, DME Home Oxygen 5L/min  Discharge Condition: STABLE   CODE STATUS: FULL DIET: heart healthy/carb modified while on steroid taper then resume heart health   Brief Hospitalization Summary: Please see all hospital notes, images, labs for full details of the hospitalization. ADMISSION HPI: Christopher Booth is a 82 y.o. male with medical history significant of coronary artery disease, essential hypertension, hyperaldosteronism, lumbar radiculopathy, and more presents the ED with a chief complaint of dyspnea.  Of note patient was recently admitted on January 21 and discharged on January 23 for the same complaint.  At that time he was diagnosed with acute respiratory failure with hypoxia requiring 2 L nasal cannula secondary to COVID virus and also pneumonia of the left lower lobe.  Patient apparently was not taking his oral Augmentin as prescribed on discharge and there is concern for bacterial pneumonia.  He has been readmitted for acute hypoxemic respiratory failure in the setting of recent COVID infection. Pulmonology consulted 1/30 to assist with management.  HOSPITAL COURSE BY PROBLEM   Acute hypoxemic respiratory failure likely in the setting of recent COVID-19 infection with possible ALI No PE noted Chest x-ray and CT imaging demonstrating findings of ongoing COVID-19 infection/acute lung injury that has been associated Procalcitonin level low and discontinued antibiotics No prior history of tobacco abuse and patient has had COVID vaccinations Appreciate pulmonology consultation  Pt has been weaned down to 5L [place] with goal pulse  ox 88-92%, encouraged prone positioning;  Pulmonologist has been consulted; Recommendation is that patient will require a long steroid taper per pulmonary to avoid flare up of symptoms  Pt weaned down to 5L [place] and tolerating well.  DC home today.  He will discharge on home oxygen arranged by TOC 5L/min [place].  Home health RN ordered to help him.   I have made an outpatient referral to [facility] for pulmonary follow-up.     COVID-19 viral infection Continue Solu-Medrol as he had COVID on prior admission Inflammatory markers trending down Weaning down oxygen as able, with goal to get him down to 6L or less of oxygen to go home DC home today on long prednisone taper as ordered.    Hypocalcemia Corrected with low albumin   Hyponatremia-resolved (136) Likely in the setting of respiratory illness He was treated with IV normal saline   GERD PPI - BID while on steroid taper recommended    Dyslipidemia Statin   Hypertension Continue home medications and monitor   Mild Steroid-induced hyperglycemia - treated with SSI and prandial coverage with frequent CBG monitoring  CBG (last 3)  Recent Labs    02/27/22 2116 02/28/22 0310 02/28/22 0801  GLUCAP 208* 199* 212*  Weaning down steroids with prednisone taper Tradjenta 5 mg daily x 1 month while on steroid taper.   Carb modified diet while on steroid taper recommended.   Discharge Diagnoses:  Principal Problem:   Acute respiratory failure with hypoxia (HCC) Active Problems:   Essential hypertension, benign   Mixed hyperlipidemia   Sepsis ([place])   Multifocal pneumonia   COVID-19 virus infection   Gastroesophageal reflux disease   Hyponatremia  Hypocalcemia   Discharge Instructions: Discharge Instructions     Ambulatory referral to Pulmonology   Complete by: As directed    Covid hypoxemia follow up long hospitalization   Reason for referral: Other      Allergies as of 02/28/2022       Reactions   Diovan  [valsartan] Other (See Comments)   Unknown reaction        Medication List     STOP taking these medications    amoxicillin-clavulanate 875-125 MG tablet Commonly known as: AUGMENTIN   dextromethorphan-guaiFENesin 30-600 MG 12hr tablet Commonly known as: MUCINEX DM Replaced by: guaiFENesin-dextromethorphan 100-10 MG/5ML syrup   potassium chloride SA 20 MEQ tablet Commonly known as: KLOR-CON M       TAKE these medications    albuterol 108 (90 Base) MCG/ACT inhaler Commonly known as: VENTOLIN HFA Inhale 2 puffs into the lungs every 6 (six) hours as needed for wheezing or shortness of breath.   ascorbic acid 500 MG tablet Commonly known as: VITAMIN C Take 1 tablet (500 mg total) by mouth daily.   aspirin EC 81 MG tablet Take 81 mg by mouth daily.   atorvastatin 20 MG tablet Commonly known as: LIPITOR TAKE ONE TABLET BY MOUTH ONCE DAILY. What changed: when to take this   carvedilol 12.5 MG tablet Commonly known as: COREG Take 12.5 mg by mouth in the morning and at bedtime.   Desitin 40 % Pste Generic drug: Zinc Oxide Apply 3 times a day on affected area to assist with skin protection and healing.   eplerenone 25 MG tablet Commonly known as: INSPRA Take 50 mg by mouth 2 (two) times daily.   guaiFENesin-dextromethorphan 100-10 MG/5ML syrup Commonly known as: ROBITUSSIN DM Take 10 mLs by mouth every 4 (four) hours as needed for cough. Replaces: dextromethorphan-guaiFENesin 30-600 MG 12hr tablet   ipratropium-albuterol 0.5-2.5 (3) MG/3ML Soln Commonly known as: DUONEB Take 3 mLs by nebulization 3 (three) times daily.   linagliptin 5 MG Tabs tablet Commonly known as: TRADJENTA Take 1 tablet (5 mg total) by mouth daily. Take only while on prednisone to help blood sugar Start taking on: March 01, 2022   losartan 25 MG tablet Commonly known as: Cozaar Take 1 tablet (25 mg total) by mouth daily.   multivitamin with minerals Tabs tablet Take 1 tablet by  mouth daily.   pantoprazole 40 MG tablet Commonly known as: Protonix Take 1 tablet (40 mg total) by mouth 2 (two) times daily. What changed: when to take this   predniSONE 10 MG tablet Commonly known as: DELTASONE Take 8 tablets (80 mg total) by mouth daily with breakfast for 5 days, THEN 6 tablets (60 mg total) daily with breakfast for 5 days, THEN 4 tablets (40 mg total) daily with breakfast for 5 days, THEN 2 tablets (20 mg total) daily with breakfast for 5 days, THEN 1 tablet (10 mg total) daily with breakfast for 5 days. Start taking on: March 01, 2022 What changed:  medication strength See the new instructions.   zinc sulfate 220 (50 Zn) MG capsule Take 1 capsule (220 mg total) by mouth daily.               Durable Medical Equipment  (From admission, onward)           Start     Ordered   02/28/22 1000  For home use only DME Nebulizer machine  Once       Question Answer Comment  Patient needs  a nebulizer to treat with the following condition Chronic respiratory failure ([place])   Patient needs a nebulizer to treat with the following condition Acute and chronic respiratory failure with hypoxia ([place])   Length of Need Lifetime      02/28/22 0959   02/28/22 0940  For home use only DME oxygen  Once       Question Answer Comment  Length of Need Lifetime   Mode or (Route) Nasal cannula   Liters per Minute 5   Frequency Continuous (stationary and portable oxygen unit needed)   Oxygen conserving device Yes   Oxygen delivery system Gas      02/28/22 0941            Follow-up Information     [facility] Follow up.   Specialty: Home Health Services Why: Will contact you to schedule home health visits. Contact information: [place] [address] [phone]         Susy Frizzle, MD. Schedule an appointment as soon as possible for a visit in 1 week(s).   Specialty: Family Medicine Why: Hospital Follow Up Contact  information: [address] [place] [address] [phone]         [facility]. Schedule an appointment as soon as possible for a visit in 3 week(s).   Specialty: Pulmonology Why: Hospital Follow Up Contact information: [address], [place] [address] [phone]               Allergies  Allergen Reactions   Diovan [Valsartan] Other (See Comments)    Unknown reaction   Allergies as of 02/28/2022       Reactions   Diovan [valsartan] Other (See Comments)   Unknown reaction        Medication List     STOP taking these medications    amoxicillin-clavulanate 875-125 MG tablet Commonly known as: AUGMENTIN   dextromethorphan-guaiFENesin 30-600 MG 12hr tablet Commonly known as: MUCINEX DM Replaced by: guaiFENesin-dextromethorphan 100-10 MG/5ML syrup   potassium chloride SA 20 MEQ tablet Commonly known as: KLOR-CON M       TAKE these medications    albuterol 108 (90 Base) MCG/ACT inhaler Commonly known as: VENTOLIN HFA Inhale 2 puffs into the lungs every 6 (six) hours as needed for wheezing or shortness of breath.   ascorbic acid 500 MG tablet Commonly known as: VITAMIN C Take 1 tablet (500 mg total) by mouth daily.   aspirin EC 81 MG tablet Take 81 mg by mouth daily.   atorvastatin 20 MG tablet Commonly known as: LIPITOR TAKE ONE TABLET BY MOUTH ONCE DAILY. What changed: when to take this   carvedilol 12.5 MG tablet Commonly known as: COREG Take 12.5 mg by mouth in the morning and at bedtime.   Desitin 40 % Pste Generic drug: Zinc Oxide Apply 3 times a day on affected area to assist with skin protection and healing.   eplerenone 25 MG tablet Commonly known as: INSPRA Take 50 mg by mouth 2 (two) times daily.   guaiFENesin-dextromethorphan 100-10 MG/5ML syrup Commonly known as: ROBITUSSIN DM Take 10 mLs by mouth every 4 (four) hours as needed for cough. Replaces:  dextromethorphan-guaiFENesin 30-600 MG 12hr tablet   ipratropium-albuterol 0.5-2.5 (3) MG/3ML Soln Commonly known as: DUONEB Take 3 mLs by nebulization 3 (three) times daily.   linagliptin 5 MG Tabs tablet Commonly known as: TRADJENTA Take 1 tablet (5 mg total) by mouth daily.  Take only while on prednisone to help blood sugar Start taking on: March 01, 2022   losartan 25 MG tablet Commonly known as: Cozaar Take 1 tablet (25 mg total) by mouth daily.   multivitamin with minerals Tabs tablet Take 1 tablet by mouth daily.   pantoprazole 40 MG tablet Commonly known as: Protonix Take 1 tablet (40 mg total) by mouth 2 (two) times daily. What changed: when to take this   predniSONE 10 MG tablet Commonly known as: DELTASONE Take 8 tablets (80 mg total) by mouth daily with breakfast for 5 days, THEN 6 tablets (60 mg total) daily with breakfast for 5 days, THEN 4 tablets (40 mg total) daily with breakfast for 5 days, THEN 2 tablets (20 mg total) daily with breakfast for 5 days, THEN 1 tablet (10 mg total) daily with breakfast for 5 days. Start taking on: March 01, 2022 What changed:  medication strength See the new instructions.   zinc sulfate 220 (50 Zn) MG capsule Take 1 capsule (220 mg total) by mouth daily.               Durable Medical Equipment  (From admission, onward)           Start     Ordered   02/28/22 1000  For home use only DME Nebulizer machine  Once       Question Answer Comment  Patient needs a nebulizer to treat with the following condition Chronic respiratory failure ([place])   Patient needs a nebulizer to treat with the following condition Acute and chronic respiratory failure with hypoxia ([place])   Length of Need Lifetime      02/28/22 0959   02/28/22 0940  For home use only DME oxygen  Once       Question Answer Comment  Length of Need Lifetime   Mode or (Route) Nasal cannula   Liters per Minute 5   Frequency Continuous (stationary and portable  oxygen unit needed)   Oxygen conserving device Yes   Oxygen delivery system Gas      02/28/22 0941            Procedures/Studies: DG CHEST PORT 1 VIEW  Result Date: 02/24/2022 CLINICAL DATA:  Acute respiratory failure. Recent diagnosis of COVID-19. EXAM: PORTABLE CHEST 1 VIEW COMPARISON:  Single-view of the chest and CT chest 02/21/2022. Single-view of the chest 02/15/2022. FINDINGS: Again seen is hazy, patchy bilateral airspace disease which appears slightly worse compared to the most recent exam. No pneumothorax or pleural fluid. Heart size is normal. The patient is status post CABG. Aortic atherosclerosis is noted. IMPRESSION: Hazy bilateral airspace disease consistent with pneumonia persists and appears slightly worse compared to the most recent exam. Electronically Signed   By: Inge Rise M.D.   On: 02/24/2022 08:11   CT Angio Chest Pulmonary Embolism (PE) W or WO Contrast  Result Date: 02/21/2022 CLINICAL DATA:  Significant shortness of breath and hypoxia, history of COVID-19 positivity EXAM: CT ANGIOGRAPHY CHEST WITH CONTRAST TECHNIQUE: Multidetector CT imaging of the chest was performed using the standard protocol during bolus administration of intravenous contrast. Multiplanar CT image reconstructions and MIPs were obtained to evaluate the vascular anatomy. RADIATION DOSE REDUCTION: This exam was performed according to the departmental dose-optimization program which includes automated exposure control, adjustment of the mA and/or kV according to patient size and/or use of iterative reconstruction technique. CONTRAST:  51m OMNIPAQUE IOHEXOL 350 MG/ML SOLN COMPARISON:  02/13/2022 FINDINGS: Cardiovascular: Thoracic aorta demonstrates atherosclerotic calcifications. No  aneurysmal dilatation of the thoracic aorta is noted. No findings to suggest dissection are seen. Heart is mildly enlarged. Coronary calcifications are noted. The pulmonary artery shows a normal branching pattern  bilaterally. No filling defect to suggest pulmonary embolism is seen. Mediastinum/Nodes: Thoracic inlet is within normal limits. No hilar or mediastinal adenopathy is noted. The esophagus is within normal limits. Lungs/Pleura: Lungs are well aerated bilaterally. Diffuse ground-glass opacities are noted throughout both lungs in a patchy distribution likely related to the underlying history of COVID-19 positivity. No focal confluent infiltrate is seen. No sizable parenchymal nodules are noted. Upper Abdomen: Gallbladder has been surgically removed. The remainder of the upper abdomen is within normal limits. Musculoskeletal: Degenerative changes of the thoracic spine are seen. No acute rib abnormality is seen. Review of the MIP images confirms the above findings. IMPRESSION: Patchy ground-glass opacities bilaterally likely related to the recent COVID-19 diagnosis. No evidence of pulmonary emboli. No other focal abnormality is seen. Aortic Atherosclerosis (ICD10-I70.0). Electronically Signed   By: Inez Catalina M.D.   On: 02/21/2022 20:08   DG Chest Port 1 View  Result Date: 02/21/2022 CLINICAL DATA:  Shortness of breath. COVID positive. In hospital admission for COVID pneumonia. EXAM: PORTABLE CHEST 1 VIEW COMPARISON:  Radiograph and CT 02/13/2022 FINDINGS: Prior median sternotomy. Stable heart size and mediastinal contours. Aortic atherosclerosis. Worsening patchy opacity in the left mid lower lung zone. New patchy opacity in the mid lower right lung zone. No pleural fluid or pneumothorax. No acute osseous findings. IMPRESSION: Worsening left lung and new right lung opacities typical of COVID pneumonia. Electronically Signed   By: Keith Rake M.D.   On: 02/21/2022 17:37   CT Angio Chest PE W and/or Wo Contrast  Result Date: 02/13/2022 CLINICAL DATA:  Abdominal pain.  Concern for pulmonary embolism. EXAM: CT ANGIOGRAPHY CHEST CT ABDOMEN AND PELVIS WITH CONTRAST TECHNIQUE: Multidetector CT imaging of the  chest was performed using the standard protocol during bolus administration of intravenous contrast. Multiplanar CT image reconstructions and MIPs were obtained to evaluate the vascular anatomy. Multidetector CT imaging of the abdomen and pelvis was performed using the standard protocol during bolus administration of intravenous contrast. RADIATION DOSE REDUCTION: This exam was performed according to the departmental dose-optimization program which includes automated exposure control, adjustment of the mA and/or kV according to patient size and/or use of iterative reconstruction technique. CONTRAST:  178m OMNIPAQUE IOHEXOL 350 MG/ML SOLN COMPARISON:  Chest radiograph dated 02/13/2022 and CT abdomen pelvis dated 06/23/2021. FINDINGS: Evaluation of this exam is limited due to respiratory motion artifact. CTA CHEST FINDINGS Cardiovascular: There is no cardiomegaly or pericardial effusion. There is 3 vessel coronary vascular calcification and postsurgical changes of CABG. There is mild atherosclerotic calcification of the thoracic aorta no aneurysmal dilatation or dissection. The origins of the great vessels of the aortic arch appear patent. Evaluation of the pulmonary arteries is very limited due to severe respiratory motion and suboptimal visualization of the peripheral branches. No obvious large or central pulmonary artery embolus identified. Mediastinum/Nodes: No hilar or mediastinal adenopathy. The esophagus is grossly unremarkable. No mediastinal fluid collection. Lungs/Pleura: Patchy area of nodular consolidation involving the left lower lobe and to a lesser degree in the right lower lobe may represent developing infiltrate or aspiration. There is no pleural effusion or pneumothorax. The central airways remain patent. Musculoskeletal: Degenerative changes of the spine. Median sternotomy wires. No acute osseous pathology. Review of the MIP images confirms the above findings. CT ABDOMEN and PELVIS FINDINGS No  intra-abdominal free air or free fluid. Hepatobiliary: Fatty appearing liver. No biliary dilatation. Cholecystectomy. No retained calcified stone noted in the central CBD. Pancreas: Unremarkable. No pancreatic ductal dilatation or surrounding inflammatory changes. Spleen: Normal in size without focal abnormality. Adrenals/Urinary Tract: The left adrenal gland is unremarkable. 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 There is no hydronephrosis on either side. There is symmetric enhancement and excretion of contrast by both kidneys. The visualized ureters and urinary bladder appear unremarkable. Stomach/Bowel: There is loose stool throughout the colon consistent with diarrheal state. Correlation with clinical exam and stool cultures recommended. There is no bowel obstruction or active inflammation. The appendix is unremarkable. Vascular/Lymphatic: Advanced aortoiliac atherosclerotic disease. The IVC is unremarkable. No portal venous gas. There is no adenopathy. Reproductive: The prostate and seminal vesicles are grossly unremarkable. No pelvic mass. Other: None Musculoskeletal: Osteopenia with degenerative changes of the spine. No acute osseous pathology. Review of the MIP images confirms the above findings. IMPRESSION: 1. No obvious large or central pulmonary artery embolus on motion degraded exam. 2. Bibasilar, left greater right, developing infiltrate or aspiration. 3. Diarrheal state. Correlation with clinical exam and stool cultures recommended. No bowel obstruction. Normal appendix. 4. Fatty liver. 5.  Aortic Atherosclerosis (ICD10-I70.0). Electronically Signed   By: Anner Crete M.D.   On: 02/13/2022 22:40   CT ABDOMEN PELVIS W CONTRAST  Result Date: 02/13/2022 CLINICAL DATA:  Abdominal pain.  Concern for pulmonary embolism. EXAM: CT ANGIOGRAPHY CHEST CT ABDOMEN AND PELVIS WITH CONTRAST TECHNIQUE: Multidetector CT imaging of the chest was performed using the standard protocol during bolus  administration of intravenous contrast. Multiplanar CT image reconstructions and MIPs were obtained to evaluate the vascular anatomy. Multidetector CT imaging of the abdomen and pelvis was performed using the standard protocol during bolus administration of intravenous contrast. RADIATION DOSE REDUCTION: This exam was performed according to the departmental dose-optimization program which includes automated exposure control, adjustment of the mA and/or kV according to patient size and/or use of iterative reconstruction technique. CONTRAST:  130m OMNIPAQUE IOHEXOL 350 MG/ML SOLN COMPARISON:  Chest radiograph dated 02/13/2022 and CT abdomen pelvis dated 06/23/2021. FINDINGS: Evaluation of this exam is limited due to respiratory motion artifact. CTA CHEST FINDINGS Cardiovascular: There is no cardiomegaly or pericardial effusion. There is 3 vessel coronary vascular calcification and postsurgical changes of CABG. There is mild atherosclerotic calcification of the thoracic aorta no aneurysmal dilatation or dissection. The origins of the great vessels of the aortic arch appear patent. Evaluation of the pulmonary arteries is very limited due to severe respiratory motion and suboptimal visualization of the peripheral branches. No obvious large or central pulmonary artery embolus identified. Mediastinum/Nodes: No hilar or mediastinal adenopathy. The esophagus is grossly unremarkable. No mediastinal fluid collection. Lungs/Pleura: Patchy area of nodular consolidation involving the left lower lobe and to a lesser degree in the right lower lobe may represent developing infiltrate or aspiration. There is no pleural effusion or pneumothorax. The central airways remain patent. Musculoskeletal: Degenerative changes of the spine. Median sternotomy wires. No acute osseous pathology. Review of the MIP images confirms the above findings. CT ABDOMEN and PELVIS FINDINGS No intra-abdominal free air or free fluid. Hepatobiliary: Fatty  appearing liver. No biliary dilatation. Cholecystectomy. No retained calcified stone noted in the central CBD. Pancreas: Unremarkable. No pancreatic ductal dilatation or surrounding inflammatory changes. Spleen: Normal in size without focal abnormality. Adrenals/Urinary Tract: The left adrenal gland is unremarkable. 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 There is no hydronephrosis on either side. There  is symmetric enhancement and excretion of contrast by both kidneys. The visualized ureters and urinary bladder appear unremarkable. Stomach/Bowel: There is loose stool throughout the colon consistent with diarrheal state. Correlation with clinical exam and stool cultures recommended. There is no bowel obstruction or active inflammation. The appendix is unremarkable. Vascular/Lymphatic: Advanced aortoiliac atherosclerotic disease. The IVC is unremarkable. No portal venous gas. There is no adenopathy. Reproductive: The prostate and seminal vesicles are grossly unremarkable. No pelvic mass. Other: None Musculoskeletal: Osteopenia with degenerative changes of the spine. No acute osseous pathology. Review of the MIP images confirms the above findings. IMPRESSION: 1. No obvious large or central pulmonary artery embolus on motion degraded exam. 2. Bibasilar, left greater right, developing infiltrate or aspiration. 3. Diarrheal state. Correlation with clinical exam and stool cultures recommended. No bowel obstruction. Normal appendix. 4. Fatty liver. 5.  Aortic Atherosclerosis (ICD10-I70.0). Electronically Signed   By: Anner Crete M.D.   On: 02/13/2022 22:40   DG Chest Portable 1 View  Result Date: 02/13/2022 CLINICAL DATA:  Shortness of breath. EXAM: PORTABLE CHEST 1 VIEW COMPARISON:  06/23/2021. FINDINGS: The heart is enlarged and the mediastinal contour is stable. There is atherosclerotic calcification of the aorta. No consolidation, effusion, or pneumothorax. No acute osseous abnormality. Sternotomy wires  are present over the midline. IMPRESSION: No active disease. Electronically Signed   By: Brett Fairy M.D.   On: 02/13/2022 21:05     Subjective: Pt reports that he is breathing well on 5L, he is ambulating, he has support at home with family and agreeable to home healthcare and understands he will discharge home with oxygen and he requested a concentrator.    Discharge Exam: Vitals:   02/28/22 0531 02/28/22 0910  BP: 120/61 119/67  Pulse: 66 71  Resp: 20   Temp: 97.8 F (36.6 C)   SpO2: 95%    Vitals:   02/27/22 2117 02/28/22 0531 02/28/22 0531 02/28/22 0910  BP: (!) 110/53 120/61 120/61 119/67  Pulse: 70 66 66 71  Resp: '18 20 20   '$ Temp: 97.9 F (36.6 C) 97.8 F (36.6 C) 97.8 F (36.6 C)   TempSrc: Oral Oral Oral   SpO2: 94% 95% 95%   Weight:      Height:       General: Pt is alert, awake, not in acute distress Cardiovascular: normal S1/S2 +, no rubs, no gallops Respiratory: good air movement, no increased work of breathing Abdominal: Soft, NT, ND, bowel sounds + Extremities: no edema, no cyanosis   The results of significant diagnostics from this hospitalization (including imaging, microbiology, ancillary and laboratory) are listed below for reference.    Microbiology: Recent Results (from the past 240 hour(s))  Expectorated Sputum Assessment w Gram Stain, Rflx to Resp Cult     Status: None   Collection Time: 02/22/22 11:36 PM   Specimen: Sputum  Result Value Ref Range Status   Specimen Description SPUTUM  Final   Special Requests NONE  Final   Sputum evaluation   Final    Sputum specimen not acceptable for testing.  Please recollect.   NOTIFIED TINAJERO,E'@0734'$  BY MATTHEWS, B 1.30.2024 Performed at [facility], [address] [place], [place] [address]    Report Status 02/23/2022 FINAL  Final  MRSA Next Gen by PCR, Nasal     Status: None   Collection Time: 02/23/22  9:54 AM   Specimen: Nasal Mucosa; Nasal Swab  Result Value Ref Range Status   MRSA by PCR Next  Gen NOT DETECTED  NOT DETECTED Final    Comment: (NOTE) The GeneXpert MRSA Assay (FDA approved for NASAL specimens only), is one component of a comprehensive MRSA colonization surveillance program. It is not intended to diagnose MRSA infection nor to guide or monitor treatment for MRSA infections. Test performance is not FDA approved in patients less than 40 years old. Performed at [facility], [address] [place], [place] [address]      Labs: BNP (last 3 results) No results for input(s): "BNP" in the last 8760 hours. Basic Metabolic Panel: Recent Labs  Lab 02/22/22 0414 02/23/22 0444 02/24/22 0430 02/25/22 0432 02/26/22 0432 02/27/22 0504  NA 130* 131* 134* 131* 135 136  K 4.0 4.3 4.9 4.1 4.4 3.9  CL 100 100 103 102 102 107  CO2 19* '22 25 22 23 22  '$ GLUCOSE 180* 281* 214* 192* 199* 221*  BUN 28* 30* 29* 28* 26* 29*  CREATININE 0.91 0.95 0.87 0.88 0.86 0.87  CALCIUM 7.8* 7.8* 8.2* 7.8* 7.6* 7.6*  MG 2.0 2.4 2.5*  --   --   --    Liver Function Tests: Recent Labs  Lab 02/23/22 0444 02/24/22 0430 02/25/22 0432 02/26/22 0432 02/27/22 0504  AST 32 '30 31 26 25  '$ ALT 56* 59* 64* 60* 54*  ALKPHOS 73 72 76 77 83  BILITOT 1.0 1.1 0.8 0.8 1.2  PROT 6.1* 6.1* 5.8* 5.7* 5.5*  ALBUMIN 2.6* 2.6* 2.4* 2.3* 2.3*   No results for input(s): "LIPASE", "AMYLASE" in the last 168 hours. No results for input(s): "AMMONIA" in the last 168 hours. CBC: Recent Labs  Lab 02/21/22 1642 02/22/22 0414 02/23/22 0444 02/24/22 0430  WBC 16.6* 14.4* 15.5* 17.0*  NEUTROABS 13.7* 13.3* 13.7* 15.4*  HGB 15.4 14.2 13.8 13.5  HCT 44.7 40.9 39.8 39.7  MCV 86.8 86.8 86.3 87.8  PLT 407* 390 409* 403*   Cardiac Enzymes: No results for input(s): "CKTOTAL", "CKMB", "CKMBINDEX", "TROPONINI" in the last 168 hours. BNP: Invalid input(s): "POCBNP" CBG: Recent Labs  Lab 02/27/22 1121 02/27/22 1604 02/27/22 2116 02/28/22 0310 02/28/22 0801  GLUCAP 195* 270* 208* 199* 212*   D-Dimer No  results for input(s): "DDIMER" in the last 72 hours. Hgb A1c No results for input(s): "HGBA1C" in the last 72 hours. Lipid Profile No results for input(s): "CHOL", "HDL", "LDLCALC", "TRIG", "CHOLHDL", "LDLDIRECT" in the last 72 hours. Thyroid function studies No results for input(s): "TSH", "T4TOTAL", "T3FREE", "THYROIDAB" in the last 72 hours.  Invalid input(s): "FREET3" Anemia work up No results for input(s): "VITAMINB12", "FOLATE", "FERRITIN", "TIBC", "IRON", "RETICCTPCT" in the last 72 hours. Urinalysis    Component Value Date/Time   COLORURINE YELLOW 02/13/2022 2356   APPEARANCEUR HAZY (A) 02/13/2022 2356   LABSPEC 1.035 (H) 02/13/2022 2356   PHURINE 5.0 02/13/2022 2356   GLUCOSEU NEGATIVE 02/13/2022 2356   HGBUR MODERATE (A) 02/13/2022 2356   BILIRUBINUR NEGATIVE 02/13/2022 2356   KETONESUR 5 (A) 02/13/2022 2356   PROTEINUR 100 (A) 02/13/2022 2356   NITRITE NEGATIVE 02/13/2022 2356   LEUKOCYTESUR NEGATIVE 02/13/2022 2356   Sepsis Labs Recent Labs  Lab 02/21/22 1642 02/22/22 0414 02/23/22 0444 02/24/22 0430  WBC 16.6* 14.4* 15.5* 17.0*   Microbiology Recent Results (from the past 240 hour(s))  Expectorated Sputum Assessment w Gram Stain, Rflx to Resp Cult     Status: None   Collection Time: 02/22/22 11:36 PM   Specimen: Sputum  Result Value Ref Range Status   Specimen Description SPUTUM  Final   Special Requests NONE  Final   Sputum evaluation   Final    Sputum specimen not acceptable for testing.  Please recollect.   NOTIFIED TINAJERO,E'@0734'$  BY MATTHEWS, B 1.30.2024 Performed at [facility], [address] [place], [place] [address]    Report Status 02/23/2022 FINAL  Final  MRSA Next Gen by PCR, Nasal     Status: None   Collection Time: 02/23/22  9:54 AM   Specimen: Nasal Mucosa; Nasal Swab  Result Value Ref Range Status   MRSA by PCR Next Gen NOT DETECTED NOT DETECTED Final    Comment: (NOTE) The GeneXpert MRSA Assay (FDA approved for NASAL specimens  only), is one component of a comprehensive MRSA colonization surveillance program. It is not intended to diagnose MRSA infection nor to guide or monitor treatment for MRSA infections. Test performance is not FDA approved in patients less than 9 years old. Performed at [facility], [address], [place], [place] [address]    Time coordinating discharge:  41 mins   SIGNED:  Irwin Brakeman, MD  [facility] 02/28/2022, 10:23 AM How to contact the [facility] Attending or Consulting provider [place] or covering provider during after hours [place], for this patient?  Check the care team in [facility] and look for a) attending/consulting TRH provider listed and b) the [facility] team listed Log into [email or web address] and use [place]'s universal password to access. If you do not have the password, please contact the hospital operator. Locate the [facility] provider you are looking for under [facility] and page to a number that you can be directly reached. If you still have difficulty reaching the provider, please page the [facility] (Director on Call) for the Hospitalists listed on amion for assistance.

## 2022-02-28 NOTE — Progress Notes (Signed)
Patient discharged home today, transported home by family. Discharge summary went over with patient, patient verbalized understanding. Belongings sent home with patient. Patient very irritable prior to discharge rushing staff to discharge patient. Informed patient several times that oxygen had to be brought to hospital before patient could be discharged. Patient refused dinner tray and insulin due to not eating. Unable to get full set of vitals due to patient rushing and irritated about not discharging earlier during shift. [employer] [facility] educated patient on how to [place] oxygen and patient received four tanks. Patient discharged with oxygen.

## 2022-02-28 NOTE — TOC Initial Note (Signed)
Transition of Care [facility]) - Initial/Assessment Note    Patient Details  Name: Christopher Booth MRN: [id number] Date of Birth: 1940/06/21  Transition of Care [facility]) CM/SW Contact:    Loreta [address], [place] Phone Number: 02/28/2022, 1:53 PM  Clinical Narrative:                  CSW spoke with Jasmine at [facility], requested home o2 for pt, [employer] accepts, states DME to be delivered to the unit today.    Barriers to Discharge: Continued Medical Work up   Patient Goals and CMS Choice            Expected Discharge Plan and Services       Living arrangements for the past 2 months: Single Family Home Expected Discharge Date: 02/28/22                         HH Arranged: [place] Agency: Other - See comment ([facility]) Date HH Agency Contacted: 02/26/22 Time HH Agency Contacted: 1100 Representative spoke with at [facility]: Lattie Haw  Prior Living Arrangements/Services Living arrangements for the past 2 months: [place] with:: Spouse                   Activities of Daily Living Home Assistive Devices/Equipment: None ADL Screening (condition at time of admission) Patient's cognitive ability adequate to safely complete daily activities?: Yes Is the patient deaf or have difficulty hearing?: No Does the patient have difficulty seeing, even when wearing glasses/contacts?: No Does the patient have difficulty concentrating, remembering, or making decisions?: No Patient able to express need for assistance with ADLs?: Yes Does the patient have difficulty dressing or bathing?: No Independently performs ADLs?: Yes (appropriate for developmental age) Does the patient have difficulty walking or climbing stairs?: Yes Weakness of Legs: Right Weakness of Arms/Hands: None  Permission Sought/Granted                  Emotional Assessment              Admission diagnosis:  SOB (shortness of breath) [R06.02] Hypoxia [R09.02] Acute respiratory failure with hypoxia  ([place]) [J96.01] COVID [U07.1] Patient Active Problem List   Diagnosis Date Noted   Hyponatremia 02/21/2022   Hypocalcemia 02/21/2022   Gastroesophageal reflux disease 02/16/2022   Hypokalemia 02/14/2022   Multifocal pneumonia 02/14/2022   COVID-19 virus infection 02/14/2022   Acute respiratory failure with hypoxia ([place]) 02/14/2022   Sepsis ([place]) 02/13/2022   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   Lumbar radiculopathy 09/20/2019   Body mass index (BMI) 26.0-26.9, adult 09/13/2019   Foot-drop 09/13/2019   Neurogenic claudication 09/13/2019   Elevated glucose 05/28/2019   S/P CABG x 2    Coronary artery disease 01/12/2017   NSTEMI (non-ST elevated myocardial infarction) ([place]) 01/11/2017   Pain in thumb joint with movement of right hand 01/01/2015   PVCs (premature ventricular contractions) 03/29/2011   Precordial pain 03/29/2011   Nonspecific abnormal electrocardiogram (ECG) (EKG) 03/29/2011   Essential hypertension, benign 03/29/2011   Mixed hyperlipidemia 03/29/2011   PCP:  Susy Frizzle, MD Pharmacy:   [facility] [place] [address] [place] [address] Phone: [phone] Fax: [phone]     Social Determinants of Health (SDOH) Social History: SDOH Screenings   Food Insecurity: No Food Insecurity (02/21/2022)  Housing: Low Risk  (02/21/2022)  Transportation Needs: No Transportation Needs (02/21/2022)  Utilities: Not At Risk (02/21/2022)  Alcohol Screen: Low Risk  (10/30/2020)  Depression (PHQ2-9): Low Risk  (11/12/2021)  Financial Resource Strain: Low Risk  (10/30/2020)  Physical Activity: Insufficiently Active (10/30/2020)  Social Connections: Socially Integrated (10/30/2020)  Stress: No Stress Concern Present (10/30/2020)  Tobacco Use: Low Risk  (02/28/2022)   SDOH  Interventions: Housing Interventions: Intervention Not Indicated   Readmission Risk Interventions     No data to display

## 2022-03-01 ENCOUNTER — Emergency Department (HOSPITAL_COMMUNITY): Payer: PPO

## 2022-03-01 ENCOUNTER — Telehealth: Payer: Self-pay | Admitting: *Deleted

## 2022-03-01 ENCOUNTER — Encounter (HOSPITAL_COMMUNITY): Payer: Self-pay | Admitting: Internal Medicine

## 2022-03-01 ENCOUNTER — Encounter: Payer: Self-pay | Admitting: *Deleted

## 2022-03-01 ENCOUNTER — Inpatient Hospital Stay (HOSPITAL_COMMUNITY)
Admission: EM | Admit: 2022-03-01 | Discharge: 2022-03-26 | DRG: 871 | Disposition: E | Payer: PPO | Attending: Internal Medicine | Admitting: Internal Medicine

## 2022-03-01 ENCOUNTER — Other Ambulatory Visit: Payer: Self-pay

## 2022-03-01 DIAGNOSIS — Z8249 Family history of ischemic heart disease and other diseases of the circulatory system: Secondary | ICD-10-CM

## 2022-03-01 DIAGNOSIS — U071 COVID-19: Secondary | ICD-10-CM | POA: Diagnosis not present

## 2022-03-01 DIAGNOSIS — Z7982 Long term (current) use of aspirin: Secondary | ICD-10-CM

## 2022-03-01 DIAGNOSIS — J9311 Primary spontaneous pneumothorax: Secondary | ICD-10-CM

## 2022-03-01 DIAGNOSIS — E876 Hypokalemia: Secondary | ICD-10-CM | POA: Diagnosis not present

## 2022-03-01 DIAGNOSIS — I252 Old myocardial infarction: Secondary | ICD-10-CM

## 2022-03-01 DIAGNOSIS — J189 Pneumonia, unspecified organism: Secondary | ICD-10-CM | POA: Diagnosis not present

## 2022-03-01 DIAGNOSIS — I2510 Atherosclerotic heart disease of native coronary artery without angina pectoris: Secondary | ICD-10-CM | POA: Diagnosis present

## 2022-03-01 DIAGNOSIS — J930 Spontaneous tension pneumothorax: Secondary | ICD-10-CM | POA: Diagnosis not present

## 2022-03-01 DIAGNOSIS — R0602 Shortness of breath: Secondary | ICD-10-CM | POA: Diagnosis not present

## 2022-03-01 DIAGNOSIS — Z888 Allergy status to other drugs, medicaments and biological substances status: Secondary | ICD-10-CM

## 2022-03-01 DIAGNOSIS — R578 Other shock: Secondary | ICD-10-CM | POA: Diagnosis not present

## 2022-03-01 DIAGNOSIS — Z1612 Extended spectrum beta lactamase (ESBL) resistance: Secondary | ICD-10-CM | POA: Diagnosis not present

## 2022-03-01 DIAGNOSIS — R918 Other nonspecific abnormal finding of lung field: Secondary | ICD-10-CM | POA: Diagnosis not present

## 2022-03-01 DIAGNOSIS — E440 Moderate protein-calorie malnutrition: Secondary | ICD-10-CM | POA: Diagnosis present

## 2022-03-01 DIAGNOSIS — Z9981 Dependence on supplemental oxygen: Secondary | ICD-10-CM

## 2022-03-01 DIAGNOSIS — Z66 Do not resuscitate: Secondary | ICD-10-CM | POA: Diagnosis not present

## 2022-03-01 DIAGNOSIS — J9382 Other air leak: Secondary | ICD-10-CM | POA: Diagnosis not present

## 2022-03-01 DIAGNOSIS — Z8673 Personal history of transient ischemic attack (TIA), and cerebral infarction without residual deficits: Secondary | ICD-10-CM

## 2022-03-01 DIAGNOSIS — Z808 Family history of malignant neoplasm of other organs or systems: Secondary | ICD-10-CM

## 2022-03-01 DIAGNOSIS — A4151 Sepsis due to Escherichia coli [E. coli]: Principal | ICD-10-CM | POA: Diagnosis present

## 2022-03-01 DIAGNOSIS — J9601 Acute respiratory failure with hypoxia: Principal | ICD-10-CM

## 2022-03-01 DIAGNOSIS — Y95 Nosocomial condition: Secondary | ICD-10-CM | POA: Diagnosis not present

## 2022-03-01 DIAGNOSIS — R062 Wheezing: Secondary | ICD-10-CM | POA: Diagnosis not present

## 2022-03-01 DIAGNOSIS — E269 Hyperaldosteronism, unspecified: Secondary | ICD-10-CM | POA: Diagnosis not present

## 2022-03-01 DIAGNOSIS — Z6826 Body mass index (BMI) 26.0-26.9, adult: Secondary | ICD-10-CM

## 2022-03-01 DIAGNOSIS — E872 Acidosis, unspecified: Secondary | ICD-10-CM | POA: Diagnosis not present

## 2022-03-01 DIAGNOSIS — Z981 Arthrodesis status: Secondary | ICD-10-CM

## 2022-03-01 DIAGNOSIS — J80 Acute respiratory distress syndrome: Secondary | ICD-10-CM | POA: Diagnosis not present

## 2022-03-01 DIAGNOSIS — J9383 Other pneumothorax: Secondary | ICD-10-CM | POA: Diagnosis present

## 2022-03-01 DIAGNOSIS — R0682 Tachypnea, not elsewhere classified: Secondary | ICD-10-CM | POA: Diagnosis not present

## 2022-03-01 DIAGNOSIS — J155 Pneumonia due to Escherichia coli: Secondary | ICD-10-CM | POA: Diagnosis not present

## 2022-03-01 DIAGNOSIS — J939 Pneumothorax, unspecified: Secondary | ICD-10-CM | POA: Diagnosis not present

## 2022-03-01 DIAGNOSIS — I10 Essential (primary) hypertension: Secondary | ICD-10-CM | POA: Diagnosis not present

## 2022-03-01 DIAGNOSIS — A419 Sepsis, unspecified organism: Secondary | ICD-10-CM | POA: Diagnosis not present

## 2022-03-01 DIAGNOSIS — U099 Post covid-19 condition, unspecified: Secondary | ICD-10-CM | POA: Diagnosis not present

## 2022-03-01 DIAGNOSIS — Z7984 Long term (current) use of oral hypoglycemic drugs: Secondary | ICD-10-CM

## 2022-03-01 DIAGNOSIS — E1165 Type 2 diabetes mellitus with hyperglycemia: Secondary | ICD-10-CM | POA: Diagnosis not present

## 2022-03-01 DIAGNOSIS — R0600 Dyspnea, unspecified: Secondary | ICD-10-CM | POA: Diagnosis not present

## 2022-03-01 DIAGNOSIS — Z515 Encounter for palliative care: Secondary | ICD-10-CM | POA: Diagnosis not present

## 2022-03-01 DIAGNOSIS — J9690 Respiratory failure, unspecified, unspecified whether with hypoxia or hypercapnia: Secondary | ICD-10-CM | POA: Diagnosis not present

## 2022-03-01 DIAGNOSIS — Z4682 Encounter for fitting and adjustment of non-vascular catheter: Secondary | ICD-10-CM | POA: Diagnosis not present

## 2022-03-01 DIAGNOSIS — J849 Interstitial pulmonary disease, unspecified: Secondary | ICD-10-CM | POA: Diagnosis present

## 2022-03-01 DIAGNOSIS — Z951 Presence of aortocoronary bypass graft: Secondary | ICD-10-CM

## 2022-03-01 DIAGNOSIS — J90 Pleural effusion, not elsewhere classified: Secondary | ICD-10-CM | POA: Diagnosis not present

## 2022-03-01 DIAGNOSIS — R6521 Severe sepsis with septic shock: Secondary | ICD-10-CM | POA: Diagnosis not present

## 2022-03-01 DIAGNOSIS — R0902 Hypoxemia: Secondary | ICD-10-CM | POA: Diagnosis not present

## 2022-03-01 DIAGNOSIS — Z79899 Other long term (current) drug therapy: Secondary | ICD-10-CM

## 2022-03-01 DIAGNOSIS — R6520 Severe sepsis without septic shock: Secondary | ICD-10-CM

## 2022-03-01 DIAGNOSIS — T380X5A Adverse effect of glucocorticoids and synthetic analogues, initial encounter: Secondary | ICD-10-CM | POA: Diagnosis not present

## 2022-03-01 DIAGNOSIS — Z800 Family history of malignant neoplasm of digestive organs: Secondary | ICD-10-CM

## 2022-03-01 DIAGNOSIS — R000 Tachycardia, unspecified: Secondary | ICD-10-CM | POA: Diagnosis not present

## 2022-03-01 LAB — COMPREHENSIVE METABOLIC PANEL
ALT: 48 U/L — ABNORMAL HIGH (ref 0–44)
AST: 24 U/L (ref 15–41)
Albumin: 2.4 g/dL — ABNORMAL LOW (ref 3.5–5.0)
Alkaline Phosphatase: 90 U/L (ref 38–126)
Anion gap: 9 (ref 5–15)
BUN: 28 mg/dL — ABNORMAL HIGH (ref 8–23)
CO2: 24 mmol/L (ref 22–32)
Calcium: 7.4 mg/dL — ABNORMAL LOW (ref 8.9–10.3)
Chloride: 103 mmol/L (ref 98–111)
Creatinine, Ser: 0.87 mg/dL (ref 0.61–1.24)
GFR, Estimated: >60 mL/min (ref >60)
Glucose, Bld: 154 mg/dL — ABNORMAL HIGH (ref 70–99)
Potassium: 3.4 mmol/L — ABNORMAL LOW (ref 3.5–5.1)
Sodium: 136 mmol/L (ref 135–145)
Total Bilirubin: 1.8 mg/dL — ABNORMAL HIGH (ref 0.3–1.2)
Total Protein: 5.8 g/dL — ABNORMAL LOW (ref 6.5–8.1)

## 2022-03-01 LAB — URINALYSIS, W/ REFLEX TO CULTURE (INFECTION SUSPECTED)
Bacteria, UA: NONE SEEN
Bilirubin Urine: NEGATIVE
Glucose, UA: NEGATIVE mg/dL
Hgb urine dipstick: NEGATIVE
Ketones, ur: NEGATIVE mg/dL
Leukocytes,Ua: NEGATIVE
Nitrite: NEGATIVE
Protein, ur: NEGATIVE mg/dL
Specific Gravity, Urine: 1.019 (ref 1.005–1.030)
pH: 5 (ref 5.0–8.0)

## 2022-03-01 LAB — CBC WITH DIFFERENTIAL/PLATELET
Abs Immature Granulocytes: 0.21 10*3/uL — ABNORMAL HIGH (ref 0.00–0.07)
Basophils Absolute: 0 10*3/uL (ref 0.0–0.1)
Basophils Relative: 0 %
Eosinophils Absolute: 0 10*3/uL (ref 0.0–0.5)
Eosinophils Relative: 0 %
HCT: 40.8 % (ref 39.0–52.0)
Hemoglobin: 13.8 g/dL (ref 13.0–17.0)
Immature Granulocytes: 1 %
Lymphocytes Relative: 3 %
Lymphs Abs: 0.4 10*3/uL — ABNORMAL LOW (ref 0.7–4.0)
MCH: 30.2 pg (ref 26.0–34.0)
MCHC: 33.8 g/dL (ref 30.0–36.0)
MCV: 89.3 fL (ref 80.0–100.0)
Monocytes Absolute: 0.4 10*3/uL (ref 0.1–1.0)
Monocytes Relative: 3 %
Neutro Abs: 14.3 10*3/uL — ABNORMAL HIGH (ref 1.7–7.7)
Neutrophils Relative %: 93 %
Platelets: 296 10*3/uL (ref 150–400)
RBC: 4.57 MIL/uL (ref 4.22–5.81)
RDW: 12.6 % (ref 11.5–15.5)
WBC: 15.4 10*3/uL — ABNORMAL HIGH (ref 4.0–10.5)
nRBC: 0 % (ref 0.0–0.2)

## 2022-03-01 LAB — FERRITIN: Ferritin: 524 ng/mL — ABNORMAL HIGH (ref 24–336)

## 2022-03-01 LAB — APTT: aPTT: 23 seconds — ABNORMAL LOW (ref 24–36)

## 2022-03-01 LAB — LACTATE DEHYDROGENASE: LDH: 271 U/L — ABNORMAL HIGH (ref 98–192)

## 2022-03-01 LAB — TROPONIN I (HIGH SENSITIVITY)
Troponin I (High Sensitivity): 11 ng/L (ref <18)
Troponin I (High Sensitivity): 12 ng/L (ref <18)

## 2022-03-01 LAB — PROTIME-INR
INR: 1.1 (ref 0.8–1.2)
Prothrombin Time: 13.9 seconds (ref 11.4–15.2)

## 2022-03-01 LAB — LACTIC ACID, PLASMA
Lactic Acid, Venous: 2.5 mmol/L (ref 0.5–1.9)
Lactic Acid, Venous: 2.1 mmol/L (ref 0.5–1.9)

## 2022-03-01 LAB — D-DIMER, QUANTITATIVE: D-Dimer, Quant: 7.82 ug/mL-FEU — ABNORMAL HIGH (ref 0.00–0.50)

## 2022-03-01 LAB — PROCALCITONIN: Procalcitonin: <0.1 ng/mL

## 2022-03-01 LAB — BRAIN NATRIURETIC PEPTIDE: B Natriuretic Peptide: 89 pg/mL (ref 0.0–100.0)

## 2022-03-01 LAB — GLUCOSE, CAPILLARY: Glucose-Capillary: 225 mg/dL — ABNORMAL HIGH (ref 70–99)

## 2022-03-01 MED ORDER — VANCOMYCIN HCL 1500 MG/300ML IV SOLN
1500.0000 mg | INTRAVENOUS | Status: DC
  Administered 2022-03-01 – 2022-03-03 (×3): 1500 mg via INTRAVENOUS
  Filled 2022-03-01 (×4): qty 300

## 2022-03-01 MED ORDER — SODIUM CHLORIDE 0.9 % IV SOLN
2.0000 g | Freq: Once | INTRAVENOUS | Status: AC
  Administered 2022-03-01: 2 g via INTRAVENOUS
  Filled 2022-03-01: qty 12.5

## 2022-03-01 MED ORDER — ACETAMINOPHEN 500 MG PO TABS: 1000.0000 mg | ORAL_TABLET | Freq: Once | ORAL | Status: DC

## 2022-03-01 MED ORDER — ACETAMINOPHEN 650 MG RE SUPP: 650.0000 mg | Freq: Four times a day (QID) | RECTAL | Status: DC | PRN

## 2022-03-01 MED ORDER — VANCOMYCIN HCL 1500 MG/300ML IV SOLN: 1500.0000 mg | Freq: Once | INTRAVENOUS | Status: DC

## 2022-03-01 MED ORDER — MAGNESIUM SULFATE 2 GM/50ML IV SOLN
2.0000 g | Freq: Once | INTRAVENOUS | Status: AC
  Administered 2022-03-01: 2 g via INTRAVENOUS
  Filled 2022-03-01: qty 50

## 2022-03-01 MED ORDER — METHYLPREDNISOLONE SODIUM SUCC 40 MG IJ SOLR
40.0000 mg | Freq: Two times a day (BID) | INTRAMUSCULAR | Status: DC
  Administered 2022-03-02 – 2022-03-03 (×3): 40 mg via INTRAVENOUS
  Filled 2022-03-01 (×3): qty 1

## 2022-03-01 MED ORDER — PREDNISONE 50 MG PO TABS: 50.0000 mg | ORAL_TABLET | Freq: Every day | ORAL | Status: DC

## 2022-03-01 MED ORDER — SODIUM CHLORIDE 0.9 % IV SOLN
2.0000 g | Freq: Three times a day (TID) | INTRAVENOUS | Status: DC
  Administered 2022-03-01 – 2022-03-05 (×11): 2 g via INTRAVENOUS
  Filled 2022-03-01 (×11): qty 12.5

## 2022-03-01 MED ORDER — SODIUM CHLORIDE 0.9 % IV BOLUS
1000.0000 mL | Freq: Once | INTRAVENOUS | Status: AC
  Administered 2022-03-01: 1000 mL via INTRAVENOUS

## 2022-03-01 MED ORDER — IPRATROPIUM-ALBUTEROL 0.5-2.5 (3) MG/3ML IN SOLN: 3.0000 mL | Freq: Once | RESPIRATORY_TRACT | Status: DC

## 2022-03-01 MED ORDER — POTASSIUM CHLORIDE IN NACL 20-0.9 MEQ/L-% IV SOLN: INTRAVENOUS | Status: AC

## 2022-03-01 MED ORDER — POVIDONE-IODINE 10 % EX SOLN
CUTANEOUS | Status: AC
  Filled 2022-03-01: qty 14.8

## 2022-03-01 MED ORDER — CHLORHEXIDINE GLUCONATE CLOTH 2 % EX PADS
6.0000 | MEDICATED_PAD | Freq: Every day | CUTANEOUS | Status: DC
  Administered 2022-03-01 – 2022-03-04 (×4): 6 via TOPICAL

## 2022-03-01 MED ORDER — ONDANSETRON HCL 4 MG PO TABS: 4.0000 mg | ORAL_TABLET | Freq: Four times a day (QID) | ORAL | Status: DC | PRN

## 2022-03-01 MED ORDER — ONDANSETRON HCL 4 MG/2ML IJ SOLN: 4.0000 mg | Freq: Four times a day (QID) | INTRAMUSCULAR | Status: DC | PRN

## 2022-03-01 MED ORDER — GUAIFENESIN-DM 100-10 MG/5ML PO SYRP: 10.0000 mL | ORAL_SOLUTION | ORAL | Status: DC | PRN

## 2022-03-01 MED ORDER — IPRATROPIUM-ALBUTEROL 0.5-2.5 (3) MG/3ML IN SOLN
3.0000 mL | Freq: Three times a day (TID) | RESPIRATORY_TRACT | Status: DC
  Administered 2022-03-01 – 2022-03-02 (×2): 3 mL via RESPIRATORY_TRACT
  Filled 2022-03-01 (×2): qty 3

## 2022-03-01 MED ORDER — METHYLPREDNISOLONE SODIUM SUCC 125 MG IJ SOLR
125.0000 mg | Freq: Once | INTRAMUSCULAR | Status: AC
  Administered 2022-03-01: 125 mg via INTRAVENOUS
  Filled 2022-03-01: qty 2

## 2022-03-01 MED ORDER — ENOXAPARIN SODIUM 40 MG/0.4ML IJ SOSY
40.0000 mg | PREFILLED_SYRINGE | INTRAMUSCULAR | Status: DC
  Administered 2022-03-01 – 2022-03-04 (×4): 40 mg via SUBCUTANEOUS
  Filled 2022-03-01 (×3): qty 0.4

## 2022-03-01 MED ORDER — INSULIN ASPART 100 UNIT/ML IJ SOLN
0.0000 [IU] | Freq: Three times a day (TID) | INTRAMUSCULAR | Status: DC
  Administered 2022-03-01: 3 [IU] via SUBCUTANEOUS
  Administered 2022-03-02: 2 [IU] via SUBCUTANEOUS

## 2022-03-01 MED ORDER — ALBUTEROL SULFATE (2.5 MG/3ML) 0.083% IN NEBU: 2.5000 mg | INHALATION_SOLUTION | Freq: Once | RESPIRATORY_TRACT | Status: DC

## 2022-03-01 MED ORDER — VANCOMYCIN HCL IN DEXTROSE 1-5 GM/200ML-% IV SOLN
1000.0000 mg | Freq: Once | INTRAVENOUS | Status: DC
  Filled 2022-03-01: qty 200

## 2022-03-01 MED ORDER — ACETAMINOPHEN 325 MG PO TABS
650.0000 mg | ORAL_TABLET | Freq: Four times a day (QID) | ORAL | Status: DC | PRN
  Administered 2022-03-01 – 2022-03-02 (×2): 650 mg via ORAL
  Filled 2022-03-01 (×2): qty 2

## 2022-03-01 NOTE — Progress Notes (Signed)
Elink following for sepsis protocol. 

## 2022-03-01 NOTE — Assessment & Plan Note (Signed)
With acute hypoxic respiratory failure.  In the setting of recent COVID-19 and bacterial pneumonia.  No trauma.  Chest x-ray showing left tension pneumothorax with collapse of left lung.  Underwent chest tube insertion in the ED, with subsequent chest x-ray showing decrease in pneumothorax, and bilateral multifocal airspace opacities. - Per General surgery - Daily chest x-ray

## 2022-03-01 NOTE — Assessment & Plan Note (Signed)
Potassium 3.4. -Replete

## 2022-03-01 NOTE — Consult Note (Signed)
[place]  Reason for Consult: Left pneumothorax, chest tube insertion Referring Physician: Dr. Roderic Palau.  Chief Complaint   Shortness of Breath     HPI: Christopher Booth is a 82 y.o. male who presents with 1 day history of worsening shortness of breath.  He was recently admitted to the hospital, 1/21-1/23 for COVID and lower lobe pneumonia.  He was then readmitted from 1/28 - 2/4 for COVID pneumonia and acute respiratory failure.  He was discharged home yesterday on 5 L nasal cannula, with plan to follow-up with pulmonology.  He was placed on a steroid taper as well.  He has a past medical history significant for hypertension, hyperaldosteronism status post right adrenalectomy, and coronary artery disease status post CABG.  He is currently taking an 81 mg aspirin daily.  He had worsening shortness of breath, which resulted in him returning to the hospital.  In the ED, he underwent a chest x-ray which demonstrated a left-sided tension pneumothorax. General surgery was consulted for chest tube insertion.  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)    Pre-diabetes    Spinal stenosis    Temporomandibular joint disease    Wears glasses     Past Surgical History:  Procedure Laterality Date   ADRENAL GLAND SURGERY  10/17/2019   CATARACT EXTRACTION W/PHACO Right 01/28/2015   Procedure: CATARACT EXTRACTION PHACO AND INTRAOCULAR LENS PLACEMENT RIGHT EYE;  Surgeon: Williams Che, MD;  Location: [facility];  Service: Ophthalmology;  Laterality: Right;  CDE:27.23   CATARACT EXTRACTION W/PHACO Left 04/14/2015   Procedure: CATARACT  EXTRACTION PHACO AND INTRAOCULAR LENS PLACEMENT LEFT EYE CDE=15.86;  Surgeon: Williams Che, MD;  Location: [facility];  Service: Ophthalmology;  Laterality: Left;   [place]   COLONOSCOPY     CORONARY ARTERY BYPASS GRAFT N/A 01/12/2017   Procedure: CORONARY ARTERY BYPASS GRAFTING (CABG);  Surgeon: Grace Isaac, MD;  Location: [facility];  Service: Open Heart Surgery;  Laterality: N/A;  Times 3 using left internal mammary artery to LAD and endoscopically harvested right thigh saphenous vein to OM and PD   EYE SURGERY N/A    Phreesia 07/21/2019   KNEE ARTHROSCOPY Right 10/09/2013   Procedure: RIGHT ARTHROSCOPY KNEE Partial medial and lateral meniscal tear and chondroplasty;  Surgeon: Hessie Dibble, MD;  Location: [place];  Service: Orthopedics;  Laterality: Right;  Partial medial and lateral meniscal tear and chondroplasty    LEFT HEART CATH AND CORONARY ANGIOGRAPHY N/A 01/11/2017   Procedure: LEFT HEART CATH AND CORONARY ANGIOGRAPHY;  Surgeon: Jettie Booze, MD;  Location: [facility];  Service: Cardiovascular;  Laterality: N/A;   LUMBAR LAMINECTOMY/DECOMPRESSION MICRODISCECTOMY Right 09/20/2019   Procedure: Right Lumbar four- Lumbar five Lumbar five Sacral one Laminectomy/Foraminotomy;  Surgeon: Kristeen Miss, MD;  Location: [facility];  Service: Neurosurgery;  Laterality: Right;   ROBOTIC ADRENALECTOMY Right 10/17/2019   Procedure: XI ROBOTIC ADRENALECTOMY;  Surgeon: Ralene Ok, MD;  Location: [facility];  Service: General;  Laterality: Right;   TEE WITHOUT CARDIOVERSION N/A 01/12/2017  Procedure: TRANSESOPHAGEAL ECHOCARDIOGRAM (TEE);  Surgeon: Grace Isaac, MD;  Location: [facility];  Service: Open Heart Surgery;  Laterality: N/A;   TONSILLECTOMY     VASECTOMY N/A    Phreesia 07/21/2019    Family History  Problem Relation Age of Onset   Hypertension Other    Cancer Brother        Colon   Cancer Brother        Melanoma     Social History   Tobacco Use   Smoking status: Never    Passive exposure: Never   Smokeless tobacco: Never  Vaping Use   Vaping Use: Never used  Substance Use Topics   Alcohol use: Yes    Comment: Occasional   Drug use: No    Medications: I have reviewed the patient's current medications.  Allergies  Allergen Reactions   Diovan [Valsartan] Other (See Comments)    Unknown reaction     ROS:  Pertinent items are noted in HPI.  Blood pressure 132/81, pulse (!) 104, resp. rate (!) 32, SpO2 91 %. Physical Exam Vitals reviewed.  Constitutional:      Appearance: He is well-developed. He is not toxic-appearing.  HENT:     Head: Normocephalic and atraumatic.  Eyes:     Extraocular Movements: Extraocular movements intact.     Pupils: Pupils are equal, round, and reactive to light.  Cardiovascular:     Rate and Rhythm: Tachycardia present.  Pulmonary:     Effort: Tachypnea present.     Comments: Decreased breath sounds on left Abdominal:     Palpations: Abdomen is soft.     Tenderness: There is no abdominal tenderness.  Musculoskeletal:        General: Normal range of motion.     Cervical back: Normal range of motion.  Skin:    General: Skin is warm and dry.  Neurological:     General: No focal deficit present.     Mental Status: He is alert and oriented to person, place, and time.  Psychiatric:        Mood and Affect: Mood normal.        Behavior: Behavior normal.     Results: Results for orders placed or performed during the hospital encounter of 02/27/2022 (from the past 48 hour(s))  Lactic acid, plasma     Status: Abnormal   Collection Time: 03/18/2022  2:45 PM  Result Value Ref Range   Lactic Acid, Venous 2.5 (HH) 0.5 - 1.9 mmol/L    Comment: CRITICAL RESULT CALLED TO, READ BACK BY AND VERIFIED WITH: TALBOTT,T ON 03/03/2022 AT 1540 BY LOY,C Performed at [facility], [address] [place], [place] [address]   Protime-INR     Status: None   Collection Time:  02/26/2022  2:45 PM  Result Value Ref Range   Prothrombin Time 13.9 11.4 - 15.2 seconds   INR 1.1 0.8 - 1.2    Comment: (NOTE) INR goal varies based on device and disease states. Performed at [facility], [address], [place], [place] [address]   APTT     Status: Abnormal   Collection Time: 03/08/2022  2:45 PM  Result Value Ref Range   aPTT 23 (L) 24 - 36 seconds    Comment: Performed at [facility], [address], [place], [place] [address]  Blood Culture (routine x 2)     Status: None (Preliminary result)   Collection Time: 03/07/2022  2:45 PM   Specimen: Left Antecubital; Blood  Result  Value Ref Range   Specimen Description LEFT ANTECUBITAL    Special Requests      BOTTLES DRAWN AEROBIC AND ANAEROBIC Blood Culture results may not be optimal due to an inadequate volume of blood received in culture bottles Performed at [facility], [address] [place], [place] [address]    Culture PENDING    Report Status PENDING     DG Chest Port 1 View  Result Date: 03/16/2022 CLINICAL DATA:  [id number] Chest tube in place [id number] EXAM: PORTABLE CHEST 1 VIEW COMPARISON:  March 01, 2022 FINDINGS: The cardiomediastinal silhouette is unchanged in contour.LEFT chest tube is in place. No pleural effusion. Interval decrease in LEFT-sided pneumothorax with minimal residual LEFT apical pneumothorax. There is pneumomediastinum. Multifocal bilateral airspace opacities, most confluent in the RIGHT upper lobe and LEFT lung base. IMPRESSION: 1. Interval decrease in LEFT-sided pneumothorax with minimal residual LEFT apical pneumothorax. 2. Multifocal bilateral airspace opacities, most confluent in the RIGHT upper lobe and LEFT lung base. 3. Pneumomediastinum. Electronically Signed   By: Valentino Saxon M.D.   On: 03/02/2022 15:36   DG Chest Port 1 View  Result Date: 03/17/2022 CLINICAL DATA:  Shortness of breath EXAM: PORTABLE CHEST 1 VIEW COMPARISON:  Portable exam 1410 hours compared to 02/24/2022 FINDINGS:  Large LEFT pneumothorax with mediastinal shift to RIGHT. Findings are consistent with LEFT tension pneumothorax. Normal heart size post CABG. Patchy infiltrate RIGHT lung. Atelectatic LEFT lung. No pleural effusion. LEFT nipple shadow noted. IMPRESSION: LEFT tension pneumothorax with collapse of LEFT lung. Patchy infiltrates RIGHT lung. Critical Value/emergent results were called by telephone at the time of interpretation on 02/28/2022 at 2:20 pm to provider Broadus John ZAMMIT MD, who verbally acknowledged these results. Electronically Signed   By: Lavonia Dana M.D.   On: 02/25/2022 14:20     Assessment & Plan:  Christopher Booth is a 82 y.o. male who presents for persistent shortness of breath status post recent admissions for COVID pneumonia.  Imaging and blood work evaluated by myself.  -I explained the imaging findings to the patient, and that he would require left-sided chest tube placement for his pneumothorax -The risk and benefits of left chest tube placement were discussed including but not limited to bleeding, infection, need for additional procedures, and injury to surrounding structures.  After careful consideration, Christopher Booth has decided to proceed with chest tube placement. -Please refer to separate dictation for details of the procedure -Patient to be admitted to the hospitalist -Chest tube to -20 mmHg -Postprocedure chest x-ray with small apical pneumothorax but otherwise improved expansion of the lung -Small airleak noted after placement of chest tube, will continue to monitor -Daily chest x-rays -Appreciate hospitalist recommendations  All questions were answered to the satisfaction of the patient.  -- Graciella Freer, DO [facility] [address] [place], [place] [address] [phone] (office)

## 2022-03-01 NOTE — Patient Outreach (Addendum)
  Care Coordination TOC Note Transition Care Management Follow-up Telephone Call Date of discharge and from where: [facility] 02/28/22 How have you been since you were released from the hospital? Feeling better but O2 sats still dropping with exertion or talking and has a dry cough.  Any questions or concerns? Yes. Frustrated with the length of time it took to discharge from hospital yesterday. Per patient, orders were placed around 8:00am and he was discharged at 6:00 pm.   Items Reviewed: Did the pt receive and understand the discharge instructions provided? Yes  Medications obtained and verified? No . Reviewed and verified dishcarge meds. To be delivered by [facility]. Prednisone taper, tradjenta, ipratropium-albuterol neb solution Other? Yes discussed O2 usage at 6L/min. Orders are for 5 but because his tubing is so long and he is desatting into the 80s with exertion and talking, [employer] adjusted it to 6. They came out at 3:00am this morning and delivered floor concentrator b/c he ran out of O2 in the tanks provided.  Any new allergies since your discharge? No  Dietary orders reviewed? Yes Do you have support at home? Yes . Limited support. Daughter lives in [place] and checks in by phone. He provides care for his disabled wife. Neighbors check-in but they are elderly and are getting tired themselves.   Home Care and Equipment/Supplies: Were home health services ordered? yes If so, what is the name of the agency? [facility]) Has the agency set up a time to come to the patient's home? no Were any new equipment or medical supplies ordered?  Yes: Oxygen and nebulizer What is the name of the medical supply agency? [facility] Were you able to get the supplies/equipment? yes Do you have any questions related to the use of the equipment or supplies? No  Functional Questionnaire: (I = Independent and D = Dependent) ADLs: I  Bathing/Dressing- I  Meal Prep- I  Eating- I  Maintaining  continence- I  Transferring/Ambulation- I  Managing Meds- I  Follow up appointments reviewed:  PCP Hospital f/u appt confirmed? No   Specialist Hospital f/u appt confirmed? No   Are transportation arrangements needed? No  If their condition worsens, is the pt aware to call PCP or go to the Emergency Dept.? Yes Was the patient provided with contact information for the PCP's office or ED? Yes Was to pt encouraged to call back with questions or concerns? Yes  SDOH assessments and interventions completed:   Yes SDOH Interventions Today    Flowsheet Row Most Recent Value  SDOH Interventions   Food Insecurity Interventions Intervention Not Indicated  Transportation Interventions Intervention Not Indicated  Financial Strain Interventions Intervention Not Indicated       Care Coordination Interventions:  PCP follow up appointment requested Referred for Care Coordination Services:  RN Care Coordinator Pulmonary appt requested (3 week f/u recommended)    Reached out to [facility] need to start PT ASAP     Encounter Outcome:  Pt. Visit Completed    Chong Sicilian, BSN, RN-BC RN Care Coordinator [place]: [phone] Main #: [phone]

## 2022-03-01 NOTE — ED Triage Notes (Signed)
Pt arrived via [facility] c/o sob, per EMS pt SpO2 qas in the 70s on RA when they arrived, started on non-rebreather and a neb 5 of albuterol. Just recently discharged from hospital for covid and pneumonia, tachycardia

## 2022-03-01 NOTE — Progress Notes (Addendum)
Gave patient Recruitment consultant.  Patient able to do 527m, X 5, but was exhausted, so left IS and flutter at bedside.  Placed patient on HFNC at 10L and patient fluctuating between 92 to 94% on sats.  Will monitor patient throughout night.

## 2022-03-01 NOTE — Assessment & Plan Note (Addendum)
2 Recent hospitalization in the setting of COVID infection and bacterial pneumonia within the past 10-14 days. -Acute lung injury and requirement of 5 L nasal cannula supplementation at time of discharge on her last admission. -Presenting with worsening breathing and hypoxia with workup demonstrating spontaneous left tension pneumothorax. -Status post chest tube placement; general surgery assisting with care -Some improvement appreciated but is still having some air leak. -Pulmonology is consulted and will follow recommendations.   -Depending clinical improvement patient may require transfer to [facility] for cardiothoracic surgery and he is open to being intubated if needed for acute management.   -Continue current IV steroids and IV antibiotics.

## 2022-03-01 NOTE — Assessment & Plan Note (Addendum)
Stable. -N.p.o. for now, hold carvedilol, losartan

## 2022-03-01 NOTE — H&P (Signed)
History and Physical    Christopher Booth WLN:[id number] DOB: 1940-08-12 DOA: 03/11/2022  PCP: Susy Frizzle, MD  Patient coming from: Home  I have personally briefly reviewed patient's old medical records in [place]  Chief Complaint: Difficulty breathing  HPI: Christopher Booth is a 82 y.o. male with medical history significant for  CABG, HTN.  Patient presented to the ED via EMS with complaints of worsening difficulty breathing that started today. Patient was just discharged from the hospital yesterday.  He has had 2 hospitalizations in January-related to lung infection-COVID and bacterial.  Hospitalization 1/21 to 1/23-for sepsis with COVID and concomitant bacterial infection.  Completed 3 days of baricitinib while inpatient, antibiotics inpatient, discharged to complete course of antibiotics at home, per notes he did not take his Augmentin on discharge.  Rehospitalization 1/28 to 2/4-acute hypoxic respiratory failure in the setting of recent COVID infection.  CT findings demonstrating ongoing COVID infection, acute lung injury.  Procalcitonin was low so antibiotics were discontinued.  Pulmonology was consulted, recommended long steroid taper.  He was discharged on 5 L to follow-up with pulmonology.  ED Course: O2 sats down to 73% on room air on arrival, on nonrebreather.  Temperature 98.6.  Tachycardic heart rate initially up to 116.-Improved after thoracentesis (rate- 83 - 116).  Tachypnea heart rate 27- 36.  Blood pressure systolic 740C to 144Y.  Lactic acidosis 2.5 > 2.1.  Troponin 11 > 12. Leukocytosis of 15.4. Chest x-ray showing-left tension pneumothorax with collapse of left lung. General surgery consulted underwent chest tube insertion.  Subsequent chest x-ray showed decreasing left-sided pneumothorax with minimal residual left apical pneumothorax.  Also multifocal bilateral airspace opacities. Broad-spectrum antibiotics IV Vanco and cefepime started, 125 mg Solu-Medrol  given. Subsequently placed on 6 L nasal cannula and O2 sats dropped to 80s. Currently on nonrebreather.  Review of Systems: As per HPI all other systems reviewed and negative.  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   Essential hypertension    Gynecomastia    Hyperaldosteronism    Hypertension    Lumbar radiculopathy    Myocardial infarction [facility])    Pneumonia    Postoperative atrial fibrillation ([place])    Pre-diabetes    Spinal stenosis    Temporomandibular joint disease    Wears glasses     Past Surgical History:  Procedure Laterality Date   ADRENAL GLAND SURGERY  10/17/2019   CATARACT EXTRACTION W/PHACO Right 01/28/2015   Procedure: CATARACT EXTRACTION PHACO AND INTRAOCULAR LENS PLACEMENT RIGHT EYE;  Surgeon: Williams Che, MD;  Location: [facility];  Service: Ophthalmology;  Laterality: Right;  CDE:27.23   CATARACT EXTRACTION W/PHACO Left 04/14/2015   Procedure: CATARACT EXTRACTION PHACO AND INTRAOCULAR LENS PLACEMENT LEFT EYE CDE=15.86;  Surgeon: Williams Che, MD;  Location: [facility];  Service: Ophthalmology;  Laterality: Left;   [place]   COLONOSCOPY     CORONARY ARTERY BYPASS GRAFT N/A 01/12/2017   Procedure: CORONARY ARTERY BYPASS GRAFTING (CABG);  Surgeon: Grace Isaac, MD;  Location: [place];  Service: Open Heart Surgery;  Laterality: N/A;  Times 3 using left internal mammary artery to LAD and endoscopically harvested right thigh saphenous vein to OM and PD   EYE SURGERY N/A    [employer]reesia 07/21/2019   KNEE  ARTHROSCOPY Right 10/09/2013   Procedure: RIGHT ARTHROSCOPY KNEE Partial medial and lateral meniscal tear and chondroplasty;  Surgeon: Hessie Dibble, MD;  Location: [place];  Service: Orthopedics;  Laterality: Right;  Partial medial and lateral meniscal tear and  chondroplasty    LEFT HEART CATH AND CORONARY ANGIOGRAPHY N/A 01/11/2017   Procedure: LEFT HEART CATH AND CORONARY ANGIOGRAPHY;  Surgeon: Jettie Booze, MD;  Location: [place] [facility];  Service: Cardiovascular;  Laterality: N/A;   LUMBAR LAMINECTOMY/DECOMPRESSION MICRODISCECTOMY Right 09/20/2019   Procedure: Right Lumbar four- Lumbar five Lumbar five Sacral one Laminectomy/Foraminotomy;  Surgeon: Kristeen Miss, MD;  Location: [place];  Service: Neurosurgery;  Laterality: Right;   ROBOTIC ADRENALECTOMY Right 10/17/2019   Procedure: XI ROBOTIC ADRENALECTOMY;  Surgeon: Ralene Ok, MD;  Location: [place];  Service: General;  Laterality: Right;   TEE WITHOUT CARDIOVERSION N/A 01/12/2017   Procedure: TRANSESOPHAGEAL ECHOCARDIOGRAM (TEE);  Surgeon: Grace Isaac, MD;  Location: [place];  Service: Open Heart Surgery;  Laterality: N/A;   TONSILLECTOMY     VASECTOMY N/A    [employer]reesia 07/21/2019     reports that he has never smoked. He has never been exposed to tobacco smoke. He has never used smokeless tobacco. He reports current alcohol use. He reports that he does not use drugs.  Allergies  Allergen Reactions   Diovan [Valsartan] Other (See Comments)    Unknown reaction    Family History  Problem Relation Age of Onset   Hypertension Other    Cancer Brother        Colon   Cancer Brother        Melanoma    Prior to Admission medications   Medication Sig Start Date End Date Taking? Authorizing Provider  albuterol (VENTOLIN HFA) 108 (90 Base) MCG/ACT inhaler Inhale 2 puffs into the lungs every 6 (six) hours as needed for wheezing or shortness of breath. 02/16/22  Yes Barton Dubois, MD  ascorbic acid (VITAMIN C) 500 MG tablet Take 1 tablet (500 mg total) by mouth daily. 02/17/22   Barton Dubois, MD  aspirin 81 MG EC tablet Take 81 mg by mouth daily.    [provider]  atorvastatin (LIPITOR) 20 MG tablet TAKE ONE TABLET BY MOUTH ONCE DAILY. Patient taking differently:  Take 20 mg by mouth at bedtime. 08/17/21   Susy Frizzle, MD  carvedilol (COREG) 12.5 MG tablet Take 12.5 mg by mouth in the morning and at bedtime.    [provider]  eplerenone (INSPRA) 25 MG tablet Take 50 mg by mouth 2 (two) times daily. 03/31/21   [provider]  guaiFENesin-dextromethorphan (ROBITUSSIN DM) 100-10 MG/5ML syrup Take 10 mLs by mouth every 4 (four) hours as needed for cough. 02/28/22   Johnson, Clanford L, MD  ipratropium-albuterol (DUONEB) 0.5-2.5 (3) MG/3ML SOLN Take 3 mLs by nebulization 3 (three) times daily. 02/28/22   Johnson, Clanford L, MD  linagliptin (TRADJENTA) 5 MG TABS tablet Take 1 tablet (5 mg total) by mouth daily. Take only while on prednisone to help blood sugar 03/17/2022   Johnson, Clanford L, MD  losartan (COZAAR) 25 MG tablet Take 1 tablet (25 mg total) by mouth daily. 09/30/20   Susy Frizzle, MD  Multiple Vitamin (MULTIVITAMIN WITH MINERALS) TABS tablet Take 1 tablet by mouth daily.    [provider]  pantoprazole (PROTONIX) 40 MG tablet Take 1 tablet (40 mg total) by mouth 2 (two) times daily. 02/28/22 02/28/23  Murlean Iba, MD  predniSONE (DELTASONE) 10 MG tablet Take 8 tablets (80 mg total) by mouth daily with breakfast for 5 days, THEN 6 tablets (60 mg total) daily with breakfast for 5 days, THEN 4 tablets (40 mg total) daily with breakfast for 5 days, THEN 2 tablets (20 mg total) daily with breakfast for 5 days, THEN 1 tablet (10 mg total) daily with breakfast for 5 days. 03/19/2022 03/26/22  Murlean Iba, MD  Zinc Oxide (DESITIN) 40 % PSTE Apply 3 times a day on affected area to assist with skin protection and healing. 02/16/22   Barton Dubois, MD  zinc sulfate 220 (50 Zn) MG capsule Take 1 capsule (220 mg total) by mouth daily. 02/17/22   Barton Dubois, MD  telmisartan-hydrochlorothiazide (MICARDIS HCT) 80-12.5 MG per tablet Take 1 tablet by mouth daily.  04/13/11  [provider]    Physical Exam: Vitals:    03/18/2022 1500 03/18/2022 1530 03/06/2022 1600 03/23/2022 1700  BP:   129/67 (!) 133/56  Pulse: (!) 110 (!) 104 100 89  Resp: (!) 35 (!) 32 (!) 31 (!) 30  SpO2: 93% 91% 90% 95%    Constitutional: NAD, calm, comfortable Vitals:   03/09/2022 1500 03/19/2022 1530 02/27/2022 1600 03/10/2022 1700  BP:   129/67 (!) 133/56  Pulse: (!) 110 (!) 104 100 89  Resp: (!) 35 (!) 32 (!) 31 (!) 30  SpO2: 93% 91% 90% 95%   Eyes: PERRL, lids and conjunctivae normal ENMT: Mucous membranes are moist.  Neck: normal, supple, no masses, no thyromegaly Respiratory: Equal breath sounds, mild to moderate increased work of breathing, chest tube left chest Cardiovascular: Regular rate and rhythm, no murmurs / rubs / gallops. No extremity edema.  Extremities warm. Abdomen: no tenderness, no masses palpated. No hepatosplenomegaly.   Musculoskeletal: no clubbing / cyanosis. No joint deformity upper and lower extremities.  Right foot drop. Skin: no rashes, lesions, ulcers. No induration Neurologic: No apparent cranial nerve abnormalities, moving extremities spontaneously Psychiatric: Normal judgment and insight. Alert and oriented x 3. Normal mood.   Labs on Admission: I have personally reviewed following labs and imaging studies  CBC: Recent Labs  Lab 02/23/22 0444 02/24/22 0430 03/15/2022 1600  WBC 15.5* 17.0* 15.4*  NEUTROABS 13.7* 15.4* 14.3*  HGB 13.8 13.5 13.8  HCT 39.8 39.7 40.8  MCV 86.3 87.8 89.3  PLT 409* 403* 295   Basic Metabolic Panel: Recent Labs  Lab 02/23/22 0444 02/24/22 0430 02/25/22 0432 02/26/22 0432 02/27/22 0504 03/10/2022 1600  NA 131* 134* 131* 135 136 136  K 4.3 4.9 4.1 4.4 3.9 3.4*  CL 100 103 102 102 107 103  CO2 '22 25 22 23 22 24  '$ GLUCOSE 281* 214* 192* 199* 221* 154*  BUN 30* 29* 28* 26* 29* 28*  CREATININE 0.95 0.87 0.88 0.86 0.87 0.87  CALCIUM 7.8* 8.2* 7.8* 7.6* 7.6* 7.4*  MG 2.4 2.5*  --   --   --   --    GFR: Estimated Creatinine Clearance: 60.1 mL/min (by C-G formula based  on SCr of 0.87 mg/dL). Liver Function Tests: Recent Labs  Lab 02/24/22 0430 02/25/22 0432 02/26/22 0432 02/27/22 0504 03/13/2022 1600  AST '30 31 26 25 24  '$ ALT 59* 64* 60* 54* 48*  ALKPHOS 72 76 77 83 90  BILITOT 1.1 0.8 0.8 1.2 1.8*  PROT 6.1* 5.8* 5.7* 5.5* 5.8*  ALBUMIN 2.6* 2.4* 2.3* 2.3* 2.4*   Coagulation Profile: Recent Labs  Lab 03/15/2022 1445  INR 1.1   CBG: Recent  Labs  Lab 02/27/22 2116 02/28/22 0310 02/28/22 0801 02/28/22 1132 02/28/22 1701  GLUCAP 208* 199* 212* 237* 182*    Radiological Exams on Admission: DG Chest Port 1 View  Result Date: 03/04/2022 CLINICAL DATA:  [id number] Chest tube in place [id number] EXAM: PORTABLE CHEST 1 VIEW COMPARISON:  March 01, 2022 FINDINGS: The cardiomediastinal silhouette is unchanged in contour.LEFT chest tube is in place. No pleural effusion. Interval decrease in LEFT-sided pneumothorax with minimal residual LEFT apical pneumothorax. There is pneumomediastinum. Multifocal bilateral airspace opacities, most confluent in the RIGHT upper lobe and LEFT lung base. IMPRESSION: 1. Interval decrease in LEFT-sided pneumothorax with minimal residual LEFT apical pneumothorax. 2. Multifocal bilateral airspace opacities, most confluent in the RIGHT upper lobe and LEFT lung base. 3. Pneumomediastinum. Electronically Signed   By: Valentino Saxon M.D.   On: 02/25/2022 15:36   DG Chest Port 1 View  Result Date: 03/20/2022 CLINICAL DATA:  Shortness of breath EXAM: PORTABLE CHEST 1 VIEW COMPARISON:  Portable exam 1410 hours compared to 02/24/2022 FINDINGS: Large LEFT pneumothorax with mediastinal shift to RIGHT. Findings are consistent with LEFT tension pneumothorax. Normal heart size post CABG. Patchy infiltrate RIGHT lung. Atelectatic LEFT lung. No pleural effusion. LEFT nipple shadow noted. IMPRESSION: LEFT tension pneumothorax with collapse of LEFT lung. Patchy infiltrates RIGHT lung. Critical Value/emergent results were called by telephone at the  time of interpretation on 03/16/2022 at 2:20 pm to provider Broadus John ZAMMIT MD, who verbally acknowledged these results. Electronically Signed   By: Lavonia Dana M.D.   On: 03/11/2022 14:20    EKG: Independently reviewed.  Sinus tachycardia, poor quality EKG, rate 112.  QTc 469.  Assessment/Plan Principal Problem:   Spontaneous pneumothorax Active Problems:   Sepsis ([place])   Multifocal pneumonia   COVID-19 virus infection   Acute respiratory failure with hypoxia (HCC)   Hypokalemia   Essential hypertension, benign  Assessment and Plan: * Spontaneous pneumothorax With acute hypoxic respiratory failure.  In the setting of recent COVID-19 and bacterial pneumonia.  No trauma.  Chest x-ray showing left tension pneumothorax with collapse of left lung.  Underwent chest tube insertion in the ED, with subsequent chest x-ray showing decrease in pneumothorax, and bilateral multifocal airspace opacities. - Per General surgery - Daily chest x-ray  Acute respiratory failure with hypoxia (HCC) O2 sats initially 73% on room air, status post chest tube insertion for spontaneous pneumothorax, O2 sats dropped to 80s on 6 L nasal cannula, is currently on nonrebreather with sats > 90%.  COVID-19 virus infection Recent COVID-19 infection diagnosed 02/13/22.  Completed 3-day course of baricitinib.  Chest x-ray with persistent multifocal infiltrates. - Will check inflammatory panel and trend-ferritin, D-dimer, LDH, CRP -Sinew steroids  Multifocal pneumonia 2 Recent hospitalization COVID and bacterial pneumonia within the past 2 weeks.  Chest x-ray today showing persistent multifocal opacities.  Meeting severe sepsis criteria with tachycardia- 83- 116, tachypnea, leukocytosis of 15.4.  Tachycardia and tachypnea improved after thoracentesis.  Pulmonology consulted, concern for acute lung injury, recommended slow steroid taper. CTA chest 1/28-patchy groundglass opacity likely related to COVID. -1 L bolus given,  continue N/s + 20 KCL 75cc/hr x 15hrs -Continue IV vancomycin and cefepime -Procalcitonin - 125 mg Solu-Medrol given, will continue weight-based dosing for COVID - ?? ARDS, Consult pulmonology in a.m. - SSI- S while on steroids  Hypokalemia Potassium 3.4. -Replete  Essential hypertension, benign Stable. -N.p.o. for now, hold carvedilol, losartan   DVT prophylaxis: Lovenox Code Status: DNR- Confirmed with patient at bedside Family Communication:  None at bedside Disposition Plan: ~/> 2 days Consults called: Gen Surg, will likely benefit from pulmonology consult in a.m. Admission status: Inpt Stepdown I certify that at the point of admission it is my clinical judgment that the patient will require inpatient hospital care spanning beyond 2 midnights from the point of admission due to high intensity of service, high risk for further deterioration and high frequency of surveillance required.   Author: Bethena Roys, MD 03/23/2022 7:50 PM  For on call review [email or web address].

## 2022-03-01 NOTE — ED Notes (Signed)
CMP redrawn

## 2022-03-01 NOTE — Procedures (Addendum)
[facility] Chest Tube Insertion Procedure Note  Indications:  Clinically significant Pneumothorax  Pre-operative Diagnosis: Pneumothorax  Post-operative Diagnosis: Pneumothorax  Procedure Details  Informed consent was obtained for the procedure, including sedation.  Risks of lung perforation, hemorrhage, arrhythmia, and adverse drug reaction were discussed.   After betadine skin prep, patient was draped.  1% lidocaine was used throughout the procedure.  The chest was accessed in the left lateral fourth intercostal space with access needle.  Using Seldinger technique, a wire was then inserted and tract was dilated.  A 14 [place] pneumothorax catheter was inserted without difficulty.  The chest tube was then attached to Pleur-evac and placed in -20 mmHg.  The tube was sutured into place with 3-0 silk suture.  An occlusive dressing was placed with gauze and foam tape.  Patient tolerated the procedure without issue.  Findings: Air evacuated upon obtaining access to chest  Estimated Blood Loss:  Minimal         Specimens:  None              Complications:  None; patient tolerated the procedure well.         Disposition:  Admit to hospitalist         Condition: stable  Graciella Freer, DO [facility] [address] [place], [place] [address] [phone] (office)

## 2022-03-01 NOTE — ED Notes (Signed)
MD at bedside, respiratory at bedside

## 2022-03-01 NOTE — ED Provider Notes (Signed)
[facility] Provider Note   CSN: [id number] Arrival date & time: 03/17/2022  1353     History {Add pertinent medical, surgical, social history, OB history to HPI:1} Chief Complaint  Patient presents with   Shortness of Breath    Christopher Booth is a 82 y.o. male.  Patient has a history of hypertension, coronary artery disease, COVID and pneumonia.  He was discharged yesterday from the hospital on 4 L nasal.  He became extremely short of breath today  The history is provided by the patient and medical records. No language interpreter was used.  Shortness of Breath Severity:  Severe Onset quality:  Sudden Timing:  Constant Progression:  Worsening Chronicity:  Recurrent Context: activity   Relieved by:  Nothing Worsened by:  Nothing Ineffective treatments:  None tried Associated symptoms: no abdominal pain, no chest pain, no cough, no headaches and no rash        Home Medications Prior to Admission medications   Medication Sig Start Date End Date Taking? Authorizing Provider  albuterol (VENTOLIN HFA) 108 (90 Base) MCG/ACT inhaler Inhale 2 puffs into the lungs every 6 (six) hours as needed for wheezing or shortness of breath. 02/16/22   Barton Dubois, MD  ascorbic acid (VITAMIN C) 500 MG tablet Take 1 tablet (500 mg total) by mouth daily. 02/17/22   Barton Dubois, MD  aspirin 81 MG EC tablet Take 81 mg by mouth daily.    [provider]  atorvastatin (LIPITOR) 20 MG tablet TAKE ONE TABLET BY MOUTH ONCE DAILY. Patient taking differently: Take 20 mg by mouth at bedtime. 08/17/21   Susy Frizzle, MD  carvedilol (COREG) 12.5 MG tablet Take 12.5 mg by mouth in the morning and at bedtime.    [provider]  eplerenone (INSPRA) 25 MG tablet Take 50 mg by mouth 2 (two) times daily. 03/31/21   [provider]  guaiFENesin-dextromethorphan (ROBITUSSIN DM) 100-10 MG/5ML syrup Take 10 mLs by mouth every 4 (four) hours  as needed for cough. 02/28/22   Johnson, Clanford L, MD  ipratropium-albuterol (DUONEB) 0.5-2.5 (3) MG/3ML SOLN Take 3 mLs by nebulization 3 (three) times daily. 02/28/22   Johnson, Clanford L, MD  linagliptin (TRADJENTA) 5 MG TABS tablet Take 1 tablet (5 mg total) by mouth daily. Take only while on prednisone to help blood sugar 02/28/2022   Johnson, Clanford L, MD  losartan (COZAAR) 25 MG tablet Take 1 tablet (25 mg total) by mouth daily. 09/30/20   Susy Frizzle, MD  Multiple Vitamin (MULTIVITAMIN WITH MINERALS) TABS tablet Take 1 tablet by mouth daily.    [provider]  pantoprazole (PROTONIX) 40 MG tablet Take 1 tablet (40 mg total) by mouth 2 (two) times daily. 02/28/22 02/28/23  Wynetta Emery, Clanford L, MD  predniSONE (DELTASONE) 10 MG tablet Take 8 tablets (80 mg total) by mouth daily with breakfast for 5 days, THEN 6 tablets (60 mg total) daily with breakfast for 5 days, THEN 4 tablets (40 mg total) daily with breakfast for 5 days, THEN 2 tablets (20 mg total) daily with breakfast for 5 days, THEN 1 tablet (10 mg total) daily with breakfast for 5 days. 03/12/2022 03/26/22  Murlean Iba, MD  Zinc Oxide (DESITIN) 40 % PSTE Apply 3 times a day on affected area to assist with skin protection and healing. 02/16/22   Barton Dubois, MD  zinc sulfate 220 (50 Zn) MG capsule Take 1 capsule (220 mg total) by mouth  daily. 02/17/22   Barton Dubois, MD  telmisartan-hydrochlorothiazide (MICARDIS HCT) 80-12.5 MG per tablet Take 1 tablet by mouth daily.  04/13/11  [provider]      Allergies    Diovan [valsartan]    Review of Systems   Review of Systems  Constitutional:  Negative for appetite change and fatigue.  HENT:  Negative for congestion, ear discharge and sinus pressure.   Eyes:  Negative for discharge.  Respiratory:  Positive for shortness of breath. Negative for cough.   Cardiovascular:  Negative for chest pain.  Gastrointestinal:  Negative for abdominal pain and diarrhea.   Genitourinary:  Negative for frequency and hematuria.  Musculoskeletal:  Negative for back pain.  Skin:  Negative for rash.  Neurological:  Negative for seizures and headaches.  Psychiatric/Behavioral:  Negative for hallucinations.     Physical Exam Updated Vital Signs BP 132/81   Pulse (!) 104   Resp (!) 32   SpO2 91%  Physical Exam Vitals and nursing note reviewed.  Constitutional:      Appearance: He is well-developed.  HENT:     Head: Normocephalic.     Nose: Nose normal.  Eyes:     General: No scleral icterus.    Conjunctiva/sclera: Conjunctivae normal.  Neck:     Thyroid: No thyromegaly.  Cardiovascular:     Rate and Rhythm: Normal rate and regular rhythm.     Heart sounds: No murmur heard.    No friction rub. No gallop.  Pulmonary:     Breath sounds: No stridor. No wheezing or rales.     Comments: Decreased breath sounds on the left Chest:     Chest wall: No tenderness.  Abdominal:     General: There is no distension.     Tenderness: There is no abdominal tenderness. There is no rebound.  Musculoskeletal:        General: Normal range of motion.     Cervical back: Neck supple.  Lymphadenopathy:     Cervical: No cervical adenopathy.  Skin:    Findings: No erythema or rash.  Neurological:     Mental Status: He is alert and oriented to person, place, and time.     Motor: No abnormal muscle tone.     Coordination: Coordination normal.  Psychiatric:        Behavior: Behavior normal.     ED Results / Procedures / Treatments   Labs (all labs ordered are listed, but only abnormal results are displayed) Labs Reviewed  LACTIC ACID, PLASMA - Abnormal; Notable for the following components:      Result Value   Lactic Acid, Venous 2.5 (*)    All other components within normal limits  APTT - Abnormal; Notable for the following components:   aPTT 23 (*)    All other components within normal limits  CULTURE, BLOOD (ROUTINE X 2)  RESP PANEL BY RT-PCR (RSV, FLU  A&B, COVID)  RVPGX2  PROTIME-INR  LACTIC ACID, PLASMA  CBC WITH DIFFERENTIAL/PLATELET  URINALYSIS, W/ REFLEX TO CULTURE (INFECTION SUSPECTED)  BRAIN NATRIURETIC PEPTIDE  COMPREHENSIVE METABOLIC PANEL  CBC WITH DIFFERENTIAL/PLATELET  TROPONIN I (HIGH SENSITIVITY)  TROPONIN I (HIGH SENSITIVITY)    EKG None  Radiology DG Chest Port 1 View  Result Date: 03/20/2022 CLINICAL DATA:  [id number] Chest tube in place [id number] EXAM: PORTABLE CHEST 1 VIEW COMPARISON:  March 01, 2022 FINDINGS: The cardiomediastinal silhouette is unchanged in contour.LEFT chest tube is in place. No pleural effusion. Interval decrease in LEFT-sided pneumothorax with  minimal residual LEFT apical pneumothorax. There is pneumomediastinum. Multifocal bilateral airspace opacities, most confluent in the RIGHT upper lobe and LEFT lung base. IMPRESSION: 1. Interval decrease in LEFT-sided pneumothorax with minimal residual LEFT apical pneumothorax. 2. Multifocal bilateral airspace opacities, most confluent in the RIGHT upper lobe and LEFT lung base. 3. Pneumomediastinum. Electronically Signed   By: Valentino Saxon M.D.   On: 02/28/2022 15:36   DG Chest Port 1 View  Result Date: 03/23/2022 CLINICAL DATA:  Shortness of breath EXAM: PORTABLE CHEST 1 VIEW COMPARISON:  Portable exam 1410 hours compared to 02/24/2022 FINDINGS: Large LEFT pneumothorax with mediastinal shift to RIGHT. Findings are consistent with LEFT tension pneumothorax. Normal heart size post CABG. Patchy infiltrate RIGHT lung. Atelectatic LEFT lung. No pleural effusion. LEFT nipple shadow noted. IMPRESSION: LEFT tension pneumothorax with collapse of LEFT lung. Patchy infiltrates RIGHT lung. Critical Value/emergent results were called by telephone at the time of interpretation on 03/20/2022 at 2:20 pm to provider Broadus John Mariel Lukins MD, who verbally acknowledged these results. Electronically Signed   By: Lavonia Dana M.D.   On: 03/24/2022 14:20    Procedures Procedures  {Document  cardiac monitor, telemetry assessment procedure when appropriate:1}  Medications Ordered in ED Medications  ceFEPIme (MAXIPIME) 2 g in sodium chloride 0.9 % 100 mL IVPB (2 g Intravenous New Bag/Given 03/11/2022 1535)  methylPREDNISolone sodium succinate (SOLU-MEDROL) 125 mg/2 mL injection 125 mg (has no administration in time range)  magnesium sulfate IVPB 2 g 50 mL (2 g Intravenous New Bag/Given 03/02/2022 1533)  povidone-iodine (BETADINE) 10 % external solution (has no administration in time range)  ceFEPIme (MAXIPIME) 2 g in sodium chloride 0.9 % 100 mL IVPB (has no administration in time range)  vancomycin (VANCOREADY) IVPB 1500 mg/300 mL (has no administration in time range)    ED Course/ Medical Decision Making/ A&P   {  CRITICAL CARE Performed by: Milton Ferguson Total critical care time: 40 minutes Critical care time was exclusive of separately billable procedures and treating other patients. Critical care was necessary to treat or prevent imminent or life-threatening deterioration. Critical care was time spent personally by me on the following activities: development of treatment plan with patient and/or surrogate as well as nursing, discussions with consultants, evaluation of patient's response to treatment, examination of patient, obtaining history from patient or surrogate, ordering and performing treatments and interventions, ordering and review of laboratory studies, ordering and review of radiographic studies, pulse oximetry and re-evaluation of patient's condition.   Patient has a pneumothorax on the left side.  Chest tube has been placed by general surgeon Dr. Brendolyn Patty.   Patient improved significantly with the chest tube.  Patient will be admitted to medicine with surgery following Click here for ABCD2, HEART and other calculatorsREFRESH Note before signing :1}                          Medical Decision Making Amount and/or Complexity of Data Reviewed Labs:  ordered. Radiology: ordered. ECG/medicine tests: ordered.  Risk Prescription drug management.   Patient with COVID pneumonia and hypoxia and pneumothorax.  Chest tube has been placed and he will be admitted to medicine with general surgery following  {Document critical care time when appropriate:1} {Document review of labs and clinical decision tools ie heart score, Chads2Vasc2 etc:1}  {Document your independent review of radiology images, and any outside records:1} {Document your discussion with family members, caretakers, and with consultants:1} {Document social determinants of health affecting pt's care:1} {  Document your decision making why or why not admission, treatments were needed:1} Final Clinical Impression(s) / ED Diagnoses Final diagnoses:  None    Rx / DC Orders ED Discharge Orders     None

## 2022-03-01 NOTE — Assessment & Plan Note (Signed)
O2 sats initially 73% on room air, status post chest tube insertion for spontaneous pneumothorax, O2 sats dropped to 80s on 6 L nasal cannula, is currently on nonrebreather with sats > 90%.

## 2022-03-01 NOTE — Progress Notes (Signed)
RT called due to patients sats dropping. Upon arrival to room patient was on 6L nasal cannula and sats were 81. RT took patient off of nasal cannula and placed him back on 15L NRB. Patients sats increased to 93% and he is tolerating well at this time.

## 2022-03-01 NOTE — Assessment & Plan Note (Signed)
Recent COVID-19 infection diagnosed 02/13/22.  Completed 3-day course of baricitinib.  Chest x-ray with persistent multifocal infiltrates. - Will check inflammatory panel and trend-ferritin, D-dimer, LDH, CRP -Sinew steroids

## 2022-03-01 NOTE — Progress Notes (Signed)
Pharmacy Antibiotic Note  Christopher Booth is a 82 y.o. male admitted on 02/27/2022 with pneumonia.  Pharmacy has been consulted for Vancomycin and cefepime dosing. Just discharged from hospital for covid (02/13/22)and pneumonia  on 02/27/2022  Plan: Vancomycin 1500 mg IV Q 24 hrs. Goal AUC 400-550. Expected AUC: 520 SCr used: 0.87  Cefepime 2gm IV q8h F/U cxs and clinical progress Monitor V/S, labs and levels as indicated   No data recorded.  Recent Labs  Lab 02/23/22 0444 02/24/22 0430 02/25/22 0432 02/26/22 0432 02/27/22 0504  WBC 15.5* 17.0*  --   --   --   CREATININE 0.95 0.87 0.88 0.86 0.87    Estimated Creatinine Clearance: 60.1 mL/min (by C-G formula based on SCr of 0.87 mg/dL).    Allergies  Allergen Reactions   Diovan [Valsartan] Other (See Comments)    Unknown reaction    Antimicrobials this admission: Vancomycin 2/5 >>  Cefepime 2/5 >>   Microbiology results: 2/5 BCx: pending 2/5 UCx: pending   MRSA PCR:   Thank you for allowing pharmacy to be a part of this patient's care.  Isac Sarna, BS Pharm D, BCPS Clinical Pharmacist 03/23/2022 2:47 PM

## 2022-03-01 NOTE — ED Notes (Signed)
Attempted [place] at 6L and SpO2 dropped back down to the mid-high 80s, placed back on non-rebreather, Respiratory made aware and MD made aware

## 2022-03-01 NOTE — ED Provider Notes (Signed)
Pt signed out by Dr. Roderic Palau pending labs.    CBC with wbc elevated at 15.4, lactic 2.5, 2nd lactic 2.1; inr 1.1, bnp 89, trop 11; cmp with k 3.4, bun 28, albumin low at 2.4, ca low at 7.4  Pt given maxipime and vancomycin for possible sepsis.  He's been given IVFs.  Dr. [name] will admit.   Isla Pence, MD 03/24/2022 [phone]

## 2022-03-01 NOTE — ED Notes (Signed)
Pt on non-re breather, SpO2 96%

## 2022-03-02 ENCOUNTER — Inpatient Hospital Stay (HOSPITAL_COMMUNITY): Payer: PPO

## 2022-03-02 DIAGNOSIS — J9601 Acute respiratory failure with hypoxia: Secondary | ICD-10-CM | POA: Diagnosis not present

## 2022-03-02 DIAGNOSIS — J9383 Other pneumothorax: Secondary | ICD-10-CM | POA: Diagnosis not present

## 2022-03-02 DIAGNOSIS — J189 Pneumonia, unspecified organism: Secondary | ICD-10-CM | POA: Diagnosis not present

## 2022-03-02 DIAGNOSIS — I10 Essential (primary) hypertension: Secondary | ICD-10-CM

## 2022-03-02 DIAGNOSIS — E876 Hypokalemia: Secondary | ICD-10-CM

## 2022-03-02 LAB — CBC
HCT: 37.8 % — ABNORMAL LOW (ref 39.0–52.0)
Hemoglobin: 12.6 g/dL — ABNORMAL LOW (ref 13.0–17.0)
MCH: 30.1 pg (ref 26.0–34.0)
MCHC: 33.3 g/dL (ref 30.0–36.0)
MCV: 90.2 fL (ref 80.0–100.0)
Platelets: 254 10*3/uL (ref 150–400)
RBC: 4.19 MIL/uL — ABNORMAL LOW (ref 4.22–5.81)
RDW: 12.8 % (ref 11.5–15.5)
WBC: 13.1 10*3/uL — ABNORMAL HIGH (ref 4.0–10.5)
nRBC: 0 % (ref 0.0–0.2)

## 2022-03-02 LAB — BASIC METABOLIC PANEL
Anion gap: 7 (ref 5–15)
BUN: 26 mg/dL — ABNORMAL HIGH (ref 8–23)
CO2: 20 mmol/L — ABNORMAL LOW (ref 22–32)
Calcium: 6.9 mg/dL — ABNORMAL LOW (ref 8.9–10.3)
Chloride: 109 mmol/L (ref 98–111)
Creatinine, Ser: 0.77 mg/dL (ref 0.61–1.24)
GFR, Estimated: >60 mL/min (ref >60)
Glucose, Bld: 183 mg/dL — ABNORMAL HIGH (ref 70–99)
Potassium: 4.1 mmol/L (ref 3.5–5.1)
Sodium: 136 mmol/L (ref 135–145)

## 2022-03-02 LAB — GLUCOSE, CAPILLARY
Glucose-Capillary: 161 mg/dL — ABNORMAL HIGH (ref 70–99)
Glucose-Capillary: 132 mg/dL — ABNORMAL HIGH (ref 70–99)
Glucose-Capillary: 311 mg/dL — ABNORMAL HIGH (ref 70–99)

## 2022-03-02 LAB — D-DIMER, QUANTITATIVE: D-Dimer, Quant: 3.71 ug/mL-FEU — ABNORMAL HIGH (ref 0.00–0.50)

## 2022-03-02 LAB — PROCALCITONIN: Procalcitonin: 0.49 ng/mL

## 2022-03-02 LAB — FERRITIN: Ferritin: 650 ng/mL — ABNORMAL HIGH (ref 24–336)

## 2022-03-02 LAB — C-REACTIVE PROTEIN
CRP: 1.4 mg/dL — ABNORMAL HIGH (ref <1.0)
CRP: 11.2 mg/dL — ABNORMAL HIGH (ref <1.0)

## 2022-03-02 MED ORDER — IPRATROPIUM-ALBUTEROL 0.5-2.5 (3) MG/3ML IN SOLN
3.0000 mL | Freq: Four times a day (QID) | RESPIRATORY_TRACT | Status: DC
  Administered 2022-03-02 – 2022-03-05 (×12): 3 mL via RESPIRATORY_TRACT
  Filled 2022-03-02 (×12): qty 3

## 2022-03-02 MED ORDER — ALBUTEROL SULFATE (2.5 MG/3ML) 0.083% IN NEBU: 2.5000 mg | INHALATION_SOLUTION | RESPIRATORY_TRACT | Status: DC | PRN

## 2022-03-02 MED ORDER — ORAL CARE MOUTH RINSE: 15.0000 mL | OROMUCOSAL | Status: DC | PRN

## 2022-03-02 MED ORDER — INSULIN ASPART 100 UNIT/ML IJ SOLN
0.0000 [IU] | Freq: Three times a day (TID) | INTRAMUSCULAR | Status: DC
  Administered 2022-03-02: 7 [IU] via SUBCUTANEOUS
  Administered 2022-03-03 (×3): 2 [IU] via SUBCUTANEOUS
  Administered 2022-03-03: 1 [IU] via SUBCUTANEOUS
  Administered 2022-03-04: 3 [IU] via SUBCUTANEOUS
  Administered 2022-03-04: 2 [IU] via SUBCUTANEOUS

## 2022-03-02 NOTE — Progress Notes (Signed)
[facility] Progress Note     Subjective: Patient seen and examined. He is resting comfortably in bed.  He is currently saturating in the low 90s, high 80s on 6L [place].  He states that he feels much better since chest tube placement.  He had 130 cc of serosanguinous output from the chest tube since placement.  Repeat CXR this AM demonstrates stable apical pneumothorax and stable pneumomediastinum vs medial pneumothorax.  Objective: Vital signs in last 24 hours: Temp:  [97.6 F (36.4 C)-98.6 F (37 C)] 98 F (36.7 C) (02/06 0738) Pulse Rate:  [58-116] 80 (02/06 0900) Resp:  [14-36] 23 (02/06 0900) BP: (107-148)/(48-82) 119/48 (02/06 0900) SpO2:  [73 %-100 %] 90 % (02/06 0900) Weight:  [75.9 kg] 75.9 kg (02/05 2040) Last BM Date : 02/26/22  Intake/Output from previous day: 02/05 0701 - 02/06 0700 In: 822.3 [I.V.:422.3; IV Piggyback:400] Out: 626 [Urine:500; Chest Tube:126] Intake/Output this shift: Total I/O In: 120 [P.O.:120] Out: -   General appearance: alert, cooperative, and no distress Chest wall: left sided chest tube in place with 1-2 column air leak noted with cough, serosanguinous drainage in pleurovac  Lab Results:  Recent Labs    03/08/2022 1600 03/02/22 0445  WBC 15.4* 13.1*  HGB 13.8 12.6*  HCT 40.8 37.8*  PLT 296 254   BMET Recent Labs    02/26/2022 1600 03/02/22 0445  NA 136 136  K 3.4* 4.1  CL 103 109  CO2 24 20*  GLUCOSE 154* 183*  BUN 28* 26*  CREATININE 0.87 0.77  CALCIUM 7.4* 6.9*   PT/INR Recent Labs    03/13/2022 1445  LABPROT 13.9  INR 1.1    Studies/Results: DG Chest Port 1 View  Result Date: 03/02/2022 CLINICAL DATA:  History of pneumothorax.  Chest tube. EXAM: PORTABLE CHEST 1 VIEW COMPARISON:  Chest x-ray 03/16/2022.  Chest CT 02/21/2022. FINDINGS: Prior CABG. Heart size stable. Left chest tube in stable position. Unchanged left apical pneumothorax. Medial left pneumothorax versus pneumomediastinum again noted without  interim change. Multifocal bilateral pulmonary infiltrates again noted without interim change. No pleural effusion. No acute bony abnormalities identified. IMPRESSION: 1. Prior CABG. Heart size stable. 2. Left chest tube in stable position. Unchanged left apical pneumothorax. Medial left pneumothorax versus pneumomediastinum again noted without interim change. 3. Multifocal bilateral pulmonary infiltrates again noted without interim change. Electronically Signed   By: Marcello Moores  Register M.D.   On: 03/02/2022 07:22   DG Chest Port 1 View  Result Date: 03/20/2022 CLINICAL DATA:  [id number] Chest tube in place [id number] EXAM: PORTABLE CHEST 1 VIEW COMPARISON:  March 01, 2022 FINDINGS: The cardiomediastinal silhouette is unchanged in contour.LEFT chest tube is in place. No pleural effusion. Interval decrease in LEFT-sided pneumothorax with minimal residual LEFT apical pneumothorax. There is pneumomediastinum. Multifocal bilateral airspace opacities, most confluent in the RIGHT upper lobe and LEFT lung base. IMPRESSION: 1. Interval decrease in LEFT-sided pneumothorax with minimal residual LEFT apical pneumothorax. 2. Multifocal bilateral airspace opacities, most confluent in the RIGHT upper lobe and LEFT lung base. 3. Pneumomediastinum. Electronically Signed   By: Valentino Saxon M.D.   On: 03/16/2022 15:36   DG Chest Port 1 View  Result Date: 03/12/2022 CLINICAL DATA:  Shortness of breath EXAM: PORTABLE CHEST 1 VIEW COMPARISON:  Portable exam 1410 hours compared to 02/24/2022 FINDINGS: Large LEFT pneumothorax with mediastinal shift to RIGHT. Findings are consistent with LEFT tension pneumothorax. Normal heart size post CABG. Patchy infiltrate RIGHT lung. Atelectatic LEFT lung. No pleural effusion.  LEFT nipple shadow noted. IMPRESSION: LEFT tension pneumothorax with collapse of LEFT lung. Patchy infiltrates RIGHT lung. Critical Value/emergent results were called by telephone at the time of interpretation on 03/02/2022  at 2:20 pm to provider Broadus John ZAMMIT MD, who verbally acknowledged these results. Electronically Signed   By: Lavonia Dana M.D.   On: 02/28/2022 14:20    Anti-infectives: Anti-infectives (From admission, onward)    Start     Dose/Rate Route Frequency Ordered Stop   03/16/2022 2200  ceFEPIme (MAXIPIME) 2 g in sodium chloride 0.9 % 100 mL IVPB        2 g 200 mL/hr over 30 Minutes Intravenous Every 8 hours 03/12/2022 1505     03/16/2022 1600  vancomycin (VANCOREADY) IVPB 1500 mg/300 mL        1,500 mg 150 mL/hr over 120 Minutes Intravenous Every 24 hours 03/15/2022 1505     03/23/2022 1500  vancomycin (VANCOREADY) IVPB 1500 mg/300 mL  Status:  Discontinued        1,500 mg 150 mL/hr over 120 Minutes Intravenous  Once 02/28/2022 1437 03/12/2022 1505   03/14/2022 1415  vancomycin (VANCOCIN) IVPB 1000 mg/200 mL premix  Status:  Discontinued        1,000 mg 200 mL/hr over 60 Minutes Intravenous  Once 03/09/2022 1405 03/23/2022 1437   02/28/2022 1415  ceFEPIme (MAXIPIME) 2 g in sodium chloride 0.9 % 100 mL IVPB        2 g 200 mL/hr over 30 Minutes Intravenous  Once 03/19/2022 1405 03/02/2022 1612       Assessment/Plan:  Patient is an 82 year old male who was admitted status post left-sided chest tube placement for spontaneous pneumothorax.  He has been recently admitted twice for COVID and bilateral pneumonia.  -AM CXR with stable apical pneumothorax and stable pneumomediastinum vs medial pneumothorax -1-2 column air leak noted with cough -Will maintain the chest tube to -20 mmHg given persistent pneumothorax with air leak -Continue daily CXRs -Continue to monitor chest tube output -If patient's air leak fails to resolve, patient may require transfer to [facility] for evaluation by thoracic surgery -Care per primary team   LOS: 1 day    [place] 03/02/2022

## 2022-03-02 NOTE — Progress Notes (Signed)
Progress Note   Patient: Christopher Booth:[id number] DOB: 07-Jan-1941 DOA: 03/24/2022     1 DOS: the patient was seen and examined on 03/02/2022   Brief hospital course: As per H&P written by Dr. [name] on 02/26/2022 Christopher Booth is a 82 y.o. male with medical history significant for  CABG, HTN.  Patient presented to the ED via EMS with complaints of worsening difficulty breathing that started today. Patient was just discharged from the hospital yesterday.  He has had 2 hospitalizations in January-related to lung infection-COVID and bacterial.   Hospitalization 1/21 to 1/23-for sepsis with COVID and concomitant bacterial infection.  Completed 3 days of baricitinib while inpatient, antibiotics inpatient, discharged to complete course of antibiotics at home, per notes he did not take his Augmentin on discharge.   Rehospitalization 1/28 to 2/4-acute hypoxic respiratory failure in the setting of recent COVID infection.  CT findings demonstrating ongoing COVID infection, acute lung injury.  Procalcitonin was low so antibiotics were discontinued.  Pulmonology was consulted, recommended long steroid taper.  He was discharged on 5 L to follow-up with pulmonology.   ED Course: O2 sats down to 73% on room air on arrival, on nonrebreather.  Temperature 98.6.  Tachycardic heart rate initially up to 116.-Improved after thoracentesis (rate- 83 - 116).  Tachypnea heart rate 27- 36.  Blood pressure systolic 811B to 147W.  Lactic acidosis 2.5 > 2.1.  Troponin 11 > 12. Leukocytosis of 15.4. Chest x-ray showing-left tension pneumothorax with collapse of left lung. General surgery consulted underwent chest tube insertion.  Subsequent chest x-ray showed decreasing left-sided pneumothorax with minimal residual left apical pneumothorax.  Also multifocal bilateral airspace opacities. Broad-spectrum antibiotics IV Vanco and cefepime started, 125 mg Solu-Medrol given. Subsequently placed on 6 L nasal cannula and O2 sats  dropped to 80s.  Assessment and Plan: * Spontaneous pneumothorax -As mentioned above; continue to follow general surgery recommendations. -Continue daily x-ray -Chest tube in place. -Pulmonology consulted.  Acute respiratory failure with hypoxia (HCC) -O2 sats initially 73% on room air, status post chest tube insertion for spontaneous pneumothorax. -Oxygen saturation 88-89% on 7 L high flow nasal cannula supplementation. -Overnight required transient use of nonrebreather to maintain saturation above 90%. -Pulmonology service has been consulted and will follow recommendations. -Continue steroids and bronchodilators management.  COVID-19 virus infection -Recent COVID-19 infection diagnosed 02/13/22.  Completed 3-day course of baricitinib and has been discharged on slow steroids tapering.  -Continue supportive care -Follow recommendation by infection prevention, at this moment patient might no need airborne precaution see having more than 15 days since he was initially diagnosed with COVID.  Multifocal pneumonia 2 Recent hospitalization in the setting of COVID infection and bacterial pneumonia within the past 10-14 days. -Acute lung injury and requirement of 5 L nasal cannula supplementation at time of discharge on her last admission. -Presenting with worsening breathing and hypoxia with workup demonstrating spontaneous left tension pneumothorax. -Status post chest tube placement; general surgery assisting with care -Some improvement appreciated but is still having some air leak. -Pulmonology is consulted and will follow recommendations.   -Depending clinical improvement patient may require transfer to [facility] for cardiothoracic surgery and he is open to being intubated if needed for acute management.   -Continue current IV steroids and IV antibiotics.  Hypokalemia -Repleted and within normal limits currently -Continue to follow electrolytes trend.  Essential hypertension,  benign -Soft blood pressure at time of admission -Vital signs stabilize currently; continue holding for now oral antihypertensive agents.  Subjective: . Afebrile currently; still demonstrating shortness of breath with minimal exertion and requirement of 7 L high flow nasal cannula supplementation.  Tachypnea appreciated during examination and desaturation into the mid 80s with minimal activity (despite supplementation).  Chest tube in place.  Physical Exam: Vitals:   03/02/22 0743 03/02/22 0800 03/02/22 0900 03/02/22 1000  BP:  128/70 (!) 119/48 (!) 121/56  Pulse:  74 80 77  Resp:  (!) 24 (!) 23 (!) 28  Temp:      TempSrc:      SpO2: 96% 98% 90% (!) 87%  Weight:      Height:       General exam: Alert, awake, oriented x 3; demonstrating mild difficulty speaking in full sentences and desaturation to mid 80s despite being on 7 L nasal cannula supplementation currently. Respiratory system: Decreased breath sounds on the left side; positive rhonchi bilaterally; no wheezing.  Tachypnea with minimal exertion appreciated. Cardiovascular system:RRR. No rubs or gallops; no JVD. Gastrointestinal system: Abdomen is nondistended, soft and nontender. No organomegaly or masses felt. Normal bowel sounds heard. Central nervous system: Alert and oriented. No focal neurological deficits. Extremities: No cyanosis, clubbing or edema. Skin: No petechiae. Psychiatry: Judgement and insight appear normal. Mood & affect appropriate.   Data Reviewed: Procalcitonin: 1.19 Basic metabolic panel: Sodium 147, potassium 4.1, chloride 109, BUN 26, bicarb 20, creatinine 0.77 and GFR more than 60 WGN:FAOZH blood cells 13.1, hemoglobin 12.6 and platelet count 254 K CRP: 11.2 D-dimer: 3.71 Ferritin: 650  Family Communication: No family at bedside.  Disposition: Status is: Inpatient Remains inpatient appropriate because: Continue treatment for acute respiratory failure with hypoxia in the setting of a  spontaneous pneumothorax.   Planned Discharge Destination: Hopefully Home with Home Health   Time spent: 50 minutes  Author: Barton Dubois, MD 03/02/2022 10:33 AM  For on call review [email or web address].

## 2022-03-02 NOTE — Progress Notes (Signed)
Checked on patient, patient still on [place] now at 8L per RN documentation and sat is at 99%.  Patient resting comfortably.

## 2022-03-02 NOTE — TOC Initial Note (Signed)
Transition of Care [facility]) - Initial/Assessment Note    Patient Details  Name: Christopher Booth MRN: [id number] Date of Birth: [birth year]  Transition of [facility]) CM/SW Contact:    Salome Arnt, LCSW Phone Number: 03/02/2022, 1:46 PM  Clinical Narrative: Pt admitted for spontaneous pneumothorax. Assessment completed due to high risk readmission score. This is pt's 3rd hospital stay in a month. He is known to TOC. Pt d/c this past weekend and was set up with [facility] HHPT/RN and a nebulizer and home O2 (5L) through [employer]. Per Lattie Haw with [phone], they were unable to start care prior to return to hospital. [facility] will accept again at d/c. TOC will continue to follow.                   Expected Discharge Plan: [place] Barriers to Discharge: Continued Medical Work up   Patient Goals and CMS Choice Patient states their goals for this hospitalization and ongoing recovery are:: anticipate return home          Expected Discharge Plan and Services In-house Referral: Clinical Social Work   Post Acute Care Choice: Resumption of Svcs/PTA Provider Living arrangements for the past 2 months: Single Family Home                           HH Arranged: RN, PT [facility] Agency: [facility] [phone]) Date HH Agency Contacted: 03/02/22 Time Delta: 65 Representative spoke with at [place]: [place] Arrangements/Services Living arrangements for the past 2 months: [place] Lives with:: Spouse Patient language and need for interpreter reviewed:: Yes Do you feel safe going back to the place where you live?: Yes          Current home services: DME, Home PT, Home RN (home O2, nebulizer) Criminal Activity/Legal Involvement Pertinent to Current Situation/Hospitalization: No - Comment as needed  Activities of Daily Living Home Assistive Devices/Equipment: Cane (specify quad or straight) ADL Screening (condition at time of  admission) Patient's cognitive ability adequate to safely complete daily activities?: No Is the patient deaf or have difficulty hearing?: No Does the patient have difficulty seeing, even when wearing glasses/contacts?: No Does the patient have difficulty concentrating, remembering, or making decisions?: No Patient able to express need for assistance with ADLs?: Yes Does the patient have difficulty dressing or bathing?: No Independently performs ADLs?: Yes (appropriate for developmental age) Does the patient have difficulty walking or climbing stairs?: Yes Weakness of Legs: Right Weakness of Arms/Hands: None  Permission Sought/Granted                  Emotional Assessment       Orientation: : Oriented to Self, Oriented to Place, Oriented to  Time, Oriented to Situation Alcohol / Substance Use: Not Applicable Psych Involvement: No (comment)  Admission diagnosis:  Spontaneous pneumothorax [J93.83] Primary spontaneous pneumothorax [J93.11] Multifocal pneumonia [J18.9] Acute hypoxic respiratory failure ([place]) [J96.01] Patient Active Problem List   Diagnosis Date Noted   Spontaneous pneumothorax 03/09/2022   Hyponatremia 02/21/2022   Hypocalcemia 02/21/2022   Gastroesophageal reflux disease 02/16/2022   Hypokalemia 02/14/2022   Multifocal pneumonia 02/14/2022   COVID-19 virus infection 02/14/2022   Acute respiratory failure with hypoxia ([place]) 02/14/2022   Sepsis ([place]) 02/13/2022   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[place]) 10/17/2019   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   Lumbar radiculopathy 09/20/2019  Body mass index (BMI) 26.0-26.9, adult 09/13/2019   Foot-drop 09/13/2019   Neurogenic claudication 09/13/2019   Elevated glucose 05/28/2019   S/P CABG x 2    Coronary artery disease 01/12/2017   NSTEMI (non-ST elevated myocardial infarction) ([employer]) 01/11/2017   Pain in thumb joint with movement  of right hand 01/01/2015   PVCs (premature ventricular contractions) 03/29/2011   Precordial pain 03/29/2011   Nonspecific abnormal electrocardiogram (ECG) (EKG) 03/29/2011   Essential hypertension, benign 03/29/2011   Mixed hyperlipidemia 03/29/2011   PCP:  Susy Frizzle, MD Pharmacy:   [facility] [place] [address] [place] [address] Phone: [phone] Fax: [phone]     Social Determinants of Health (SDOH) Social History: SDOH Screenings   Food Insecurity: No Food Insecurity (03/02/2022)  Housing: Low Risk  (03/17/2022)  Transportation Needs: No Transportation Needs (03/14/2022)  Utilities: Not At Risk (03/11/2022)  Alcohol Screen: Low Risk  (10/30/2020)  Depression (PHQ2-9): Low Risk  (11/12/2021)  Financial Resource Strain: Low Risk  (03/08/2022)  Physical Activity: Insufficiently Active (10/30/2020)  Social Connections: Socially Integrated (10/30/2020)  Stress: No Stress Concern Present (10/30/2020)  Tobacco Use: Low Risk  (02/28/2022)   SDOH Interventions:     Readmission Risk Interventions    03/02/2022    1:43 PM  Readmission Risk Prevention Plan  Transportation Screening Complete  HRI or [place] Complete  Social Work Consult for [place] Planning/Counseling Complete  Palliative Care Screening Not Applicable  Medication Review Press photographer) Complete

## 2022-03-02 NOTE — Consult Note (Signed)
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  Left pigtail was placed with partial expansion of left lung.  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 rate (!) 31, height '5\' 6"'$  (1.676 m), weight 75.9 kg, SpO2 93 %.        Intake/Output Summary (Last 24 hours) at 03/02/2022 1412 Last data filed at 03/02/2022 1408 Gross per 24 hour  Intake 1849.91 ml  Output 851 ml  Net 998.91 ml   Filed Weights   03/18/2022 2040  Weight: 75.9 kg    Examination: General: Elderly man, mild distress but is able to speak in full sentences, well-built and well-nourished HENT: No pallor, icterus, no JVD, Lungs: Decreased breath sounds on left, no chest wall crepitus, mild accessory muscle use, airleak only on coughing, not on spontaneous  breathing Cardiovascular: S1-S2 regular, no murmur Abdomen: Soft, nontender, no hepatosplenomegaly Extremities: No edema, no deformity Neuro: Alert, interactive, right foot drop GU: Clear urine  Chest x-ray independently reviewed shows partial expansion of left lung with left medial pneumothorax  Labs show normal electrolytes, low procalcitonin, decreased leukocytosis  Resolved Hospital Problem list     Assessment & Plan:  Acute respiratory failure with hypoxia Recent COVID pneumonia, vaccinated but not boosted Left spontaneous pneumothorax -left lung appears partially expanded on chest x-ray however airleak seems to have improved and on my exam was only present on coughing not on spontaneous breathing with suggest improvement. 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  He request DNR status should he be found without pulse but agrees to full medical care otherwise including mechanical ventilation if required]  Labs   CBC: Recent Labs  Lab 02/24/22 0430 03/06/2022 1600 03/02/22 0445  WBC 17.0* 15.4* 13.1*  NEUTROABS 15.4* 14.3*  --   HGB 13.5 13.8 12.6*  HCT 39.7 40.8 37.8*  MCV 87.8 89.3 90.2  PLT 403* 296 941    Basic Metabolic Panel: Recent Labs  Lab 02/24/22 0430 02/25/22 0432 02/26/22  7673 02/27/22 0504 03/04/2022 1600 03/02/22 0445  NA 134* 131* 135 136 136 136  K 4.9 4.1 4.4 3.9 3.4* 4.1  CL 103 102 102 107 103 109  CO2 '25 22 23 22 24 '$ 20*  GLUCOSE 214* 192* 199* 221* 154* 183*  BUN 29* 28* 26*  29* 28* 26*  CREATININE 0.87 0.88 0.86 0.87 0.87 0.77  CALCIUM 8.2* 7.8* 7.6* 7.6* 7.4* 6.9*  MG 2.5*  --   --   --   --   --    GFR: Estimated Creatinine Clearance: 65.4 mL/min (by C-G formula based on SCr of 0.77 mg/dL). Recent Labs  Lab 02/24/22 0430 02/25/2022 1445 03/16/2022 1600 03/02/22 0445  PROCALCITON  --   --  <0.10 0.49  WBC 17.0*  --  15.4* 13.1*  LATICACIDVEN  --  2.5* 2.1*  --     Liver Function Tests: Recent Labs  Lab 02/24/22 0430 02/25/22 0432 02/26/22 0432 02/27/22 0504 03/09/2022 1600  AST '30 31 26 25 24  '$ ALT 59* 64* 60* 54* 48*  ALKPHOS 72 76 77 83 90  BILITOT 1.1 0.8 0.8 1.2 1.8*  PROT 6.1* 5.8* 5.7* 5.5* 5.8*  ALBUMIN 2.6* 2.4* 2.3* 2.3* 2.4*   No results for input(s): "LIPASE", "AMYLASE" in the last 168 hours. No results for input(s): "AMMONIA" in the last 168 hours.  ABG    Component Value Date/Time   PHART 7.48 (H) 02/23/2022 0922   PCO2ART 28 (L) 02/23/2022 0922   PO2ART 61 (L) 02/23/2022 0922   HCO3 20.9 02/23/2022 0922   TCO2 22 01/13/2017 1610   ACIDBASEDEF 1.4 02/23/2022 0922   O2SAT 93 02/23/2022 0922     Coagulation Profile: Recent Labs  Lab 03/16/2022 1445  INR 1.1    Cardiac Enzymes: No results for input(s): "CKTOTAL", "CKMB", "CKMBINDEX", "TROPONINI" in the last 168 hours.  HbA1C: Hgb A1c MFr Bld  Date/Time Value Ref Range Status  02/24/2022 11:30 AM 6.7 (H) 4.8 - 5.6 % Final    Comment:    (NOTE) Pre diabetes:          5.7%-6.4%  Diabetes:              >6.4%  Glycemic control for   <7.0% adults with diabetes   08/10/2021 02:34 PM 5.9 (H) <5.7 % of total Hgb Final    Comment:    For someone without known diabetes, a hemoglobin  A1c value between 5.7% and 6.4% is consistent with prediabetes and should be confirmed with a  follow-up test. . For someone with known diabetes, a value <7% indicates that their diabetes is well controlled. A1c targets should be individualized based on duration of diabetes, age,  comorbid conditions, and other considerations. . This assay result is consistent with an increased risk of diabetes. . Currently, no consensus exists regarding use of hemoglobin A1c for diagnosis of diabetes for children. .     CBG: Recent Labs  Lab 02/28/22 1132 02/28/22 1701 03/15/2022 2120 03/02/22 0548 03/02/22 0736  GLUCAP 237* 182* 225* 161* 132*    Review of Systems:   Shortness of breath Cough Chest pressure Right foot drop Poor appetite   Past Medical History:  He,  has a past medical history of Adrenal mass ([place]), Arthritis, CAD (coronary artery disease), Essential hypertension, Gynecomastia, Hyperaldosteronism, Hypertension, Lumbar radiculopathy, Myocardial infarction ([place]), Pneumonia, Postoperative atrial fibrillation ([place]), Pre-diabetes, Spinal stenosis, Temporomandibular joint disease, and Wears glasses.   Surgical History:   Past Surgical History:  Procedure Laterality Date  ADRENAL GLAND SURGERY  10/17/2019   CATARACT EXTRACTION W/PHACO Right 01/28/2015   Procedure: CATARACT EXTRACTION PHACO AND INTRAOCULAR LENS PLACEMENT RIGHT EYE;  Surgeon: Williams Che, MD;  Location: [facility];  Service: Ophthalmology;  Laterality: Right;  CDE:27.23   CATARACT EXTRACTION W/PHACO Left 04/14/2015   Procedure: CATARACT EXTRACTION PHACO AND INTRAOCULAR LENS PLACEMENT LEFT EYE CDE=15.86;  Surgeon: Williams Che, MD;  Location: [facility];  Service: Ophthalmology;  Laterality: Left;   [place]   COLONOSCOPY     CORONARY ARTERY BYPASS GRAFT N/A 01/12/2017   Procedure: CORONARY ARTERY BYPASS GRAFTING (CABG);  Surgeon: Grace Isaac, MD;  Location: [place] [facility];  Service: Open Heart Surgery;  Laterality: N/A;  Times 3 using left internal mammary artery to LAD and endoscopically harvested right thigh saphenous vein to OM and PD   EYE SURGERY N/A    Phreesia 07/21/2019   KNEE ARTHROSCOPY Right 10/09/2013   Procedure: RIGHT ARTHROSCOPY KNEE  Partial medial and lateral meniscal tear and chondroplasty;  Surgeon: Hessie Dibble, MD;  Location: [place];  Service: Orthopedics;  Laterality: Right;  Partial medial and lateral meniscal tear and chondroplasty    LEFT HEART CATH AND CORONARY ANGIOGRAPHY N/A 01/11/2017   Procedure: LEFT HEART CATH AND CORONARY ANGIOGRAPHY;  Surgeon: Jettie Booze, MD;  Location: [place] [facility];  Service: Cardiovascular;  Laterality: N/A;   LUMBAR LAMINECTOMY/DECOMPRESSION MICRODISCECTOMY Right 09/20/2019   Procedure: Right Lumbar four- Lumbar five Lumbar five Sacral one Laminectomy/Foraminotomy;  Surgeon: Kristeen Miss, MD;  Location: [place];  Service: Neurosurgery;  Laterality: Right;   ROBOTIC ADRENALECTOMY Right 10/17/2019   Procedure: XI ROBOTIC ADRENALECTOMY;  Surgeon: Ralene Ok, MD;  Location: [place];  Service: General;  Laterality: Right;   TEE WITHOUT CARDIOVERSION N/A 01/12/2017   Procedure: TRANSESOPHAGEAL ECHOCARDIOGRAM (TEE);  Surgeon: Grace Isaac, MD;  Location: [place];  Service: Open Heart Surgery;  Laterality: N/A;   TONSILLECTOMY     VASECTOMY N/A    Phreesia 07/21/2019     Social History:   reports that he has never smoked. He has never been exposed to tobacco smoke. He has never used smokeless tobacco. He reports current alcohol use. He reports that he does not use drugs.   Family History:  His family history includes Cancer in his brother and brother; Hypertension in an other family member.   Allergies Allergies  Allergen Reactions   Diovan [Valsartan] Other (See Comments)    Unknown reaction     Home Medications  Prior to Admission medications   Medication Sig Start Date End Date Taking? Authorizing Provider  albuterol (VENTOLIN HFA) 108 (90 Base) MCG/ACT inhaler Inhale 2 puffs into the lungs every 6 (six) hours as needed for wheezing or shortness of breath. 02/16/22  Yes Barton Dubois, MD  ascorbic acid (VITAMIN C) 500 MG tablet Take 1  tablet (500 mg total) by mouth daily. 02/17/22   Barton Dubois, MD  aspirin 81 MG EC tablet Take 81 mg by mouth daily.    [provider]  atorvastatin (LIPITOR) 20 MG tablet TAKE ONE TABLET BY MOUTH ONCE DAILY. Patient taking differently: Take 20 mg by mouth at bedtime. 08/17/21   Susy Frizzle, MD  carvedilol (COREG) 12.5 MG tablet Take 12.5 mg by mouth in the morning and at bedtime.    [provider]  eplerenone (INSPRA) 25 MG tablet Take 50 mg by mouth 2 (two) times daily. 03/31/21  [provider]  guaiFENesin-dextromethorphan (ROBITUSSIN DM) 100-10 MG/5ML syrup Take 10 mLs by mouth every 4 (four) hours as needed for cough. 02/28/22   Johnson, Clanford L, MD  ipratropium-albuterol (DUONEB) 0.5-2.5 (3) MG/3ML SOLN Take 3 mLs by nebulization 3 (three) times daily. 02/28/22   Johnson, Clanford L, MD  linagliptin (TRADJENTA) 5 MG TABS tablet Take 1 tablet (5 mg total) by mouth daily. Take only while on prednisone to help blood sugar 02/28/2022   Johnson, Clanford L, MD  losartan (COZAAR) 25 MG tablet Take 1 tablet (25 mg total) by mouth daily. 09/30/20   Susy Frizzle, MD  Multiple Vitamin (MULTIVITAMIN WITH MINERALS) TABS tablet Take 1 tablet by mouth daily.    [provider]  pantoprazole (PROTONIX) 40 MG tablet Take 1 tablet (40 mg total) by mouth 2 (two) times daily. 02/28/22 02/28/23  Wynetta Emery, Clanford L, MD  predniSONE (DELTASONE) 10 MG tablet Take 8 tablets (80 mg total) by mouth daily with breakfast for 5 days, THEN 6 tablets (60 mg total) daily with breakfast for 5 days, THEN 4 tablets (40 mg total) daily with breakfast for 5 days, THEN 2 tablets (20 mg total) daily with breakfast for 5 days, THEN 1 tablet (10 mg total) daily with breakfast for 5 days. 02/28/2022 03/26/22  Murlean Iba, MD  Zinc Oxide (DESITIN) 40 % PSTE Apply 3 times a day on affected area to assist with skin protection and healing. 02/16/22   Barton Dubois, MD  zinc sulfate 220 (50 Zn) MG  capsule Take 1 capsule (220 mg total) by mouth daily. 02/17/22   Barton Dubois, MD  telmisartan-hydrochlorothiazide (MICARDIS HCT) 80-12.5 MG per tablet Take 1 tablet by mouth daily.  04/13/11  [provider]      Kara Mead MD. FCCP. [facility] Pager : [phone]  If no response to pager , please call [phone] until 7 pm After 7:00 pm call [place]  [phone]   03/02/2022

## 2022-03-03 ENCOUNTER — Inpatient Hospital Stay (HOSPITAL_COMMUNITY): Payer: PPO

## 2022-03-03 ENCOUNTER — Other Ambulatory Visit (HOSPITAL_COMMUNITY): Payer: PPO

## 2022-03-03 DIAGNOSIS — J9383 Other pneumothorax: Secondary | ICD-10-CM | POA: Diagnosis not present

## 2022-03-03 DIAGNOSIS — J9601 Acute respiratory failure with hypoxia: Secondary | ICD-10-CM | POA: Diagnosis not present

## 2022-03-03 LAB — MRSA NEXT GEN BY PCR, NASAL: MRSA by PCR Next Gen: NOT DETECTED

## 2022-03-03 LAB — BLOOD GAS, ARTERIAL
Acid-base deficit: 0.4 mmol/L (ref 0.0–2.0)
Bicarbonate: 24.1 mmol/L (ref 20.0–28.0)
Drawn by: 30136
O2 Saturation: 99.4 %
Patient temperature: 36.9
pCO2 arterial: 38 mmHg (ref 32–48)
pH, Arterial: 7.41 (ref 7.35–7.45)
pO2, Arterial: 123 mmHg — ABNORMAL HIGH (ref 83–108)

## 2022-03-03 LAB — GLUCOSE, CAPILLARY
Glucose-Capillary: 136 mg/dL — ABNORMAL HIGH (ref 70–99)
Glucose-Capillary: 177 mg/dL — ABNORMAL HIGH (ref 70–99)
Glucose-Capillary: 178 mg/dL — ABNORMAL HIGH (ref 70–99)
Glucose-Capillary: 181 mg/dL — ABNORMAL HIGH (ref 70–99)
Glucose-Capillary: 189 mg/dL — ABNORMAL HIGH (ref 70–99)
Glucose-Capillary: 193 mg/dL — ABNORMAL HIGH (ref 70–99)

## 2022-03-03 LAB — PROCALCITONIN: Procalcitonin: 0.34 ng/mL

## 2022-03-03 LAB — D-DIMER, QUANTITATIVE: D-Dimer, Quant: 2.72 ug/mL-FEU — ABNORMAL HIGH (ref 0.00–0.50)

## 2022-03-03 LAB — C-REACTIVE PROTEIN: CRP: 8.4 mg/dL — ABNORMAL HIGH (ref <1.0)

## 2022-03-03 LAB — FERRITIN: Ferritin: 533 ng/mL — ABNORMAL HIGH (ref 24–336)

## 2022-03-03 MED ORDER — ONDANSETRON HCL 4 MG/2ML IJ SOLN: 4.0000 mg | Freq: Four times a day (QID) | INTRAMUSCULAR | Status: DC | PRN

## 2022-03-03 MED ORDER — FAMOTIDINE 20 MG PO TABS: 20.0000 mg | ORAL_TABLET | Freq: Two times a day (BID) | ORAL | Status: DC

## 2022-03-03 MED ORDER — ACETAMINOPHEN 325 MG PO TABS: 650.0000 mg | ORAL_TABLET | Freq: Four times a day (QID) | ORAL | Status: DC | PRN

## 2022-03-03 MED ORDER — ONDANSETRON HCL 4 MG PO TABS: 4.0000 mg | ORAL_TABLET | Freq: Four times a day (QID) | ORAL | Status: DC | PRN

## 2022-03-03 MED ORDER — ASPIRIN 81 MG PO TBEC: 81.0000 mg | DELAYED_RELEASE_TABLET | Freq: Every day | ORAL | Status: DC

## 2022-03-03 MED ORDER — MIDAZOLAM HCL 2 MG/2ML IJ SOLN
INTRAMUSCULAR | Status: AC
  Administered 2022-03-03: 2 mg via INTRAVENOUS
  Filled 2022-03-03: qty 2

## 2022-03-03 MED ORDER — ATORVASTATIN CALCIUM 40 MG PO TABS: 40.0000 mg | ORAL_TABLET | Freq: Every day | ORAL | Status: DC

## 2022-03-03 MED ORDER — PROPOFOL 1000 MG/100ML IV EMUL
0.0000 ug/kg/min | INTRAVENOUS | Status: DC
  Administered 2022-03-03 – 2022-03-04 (×4): 30 ug/kg/min via INTRAVENOUS
  Administered 2022-03-05: 50 ug/kg/min via INTRAVENOUS
  Administered 2022-03-05: 40 ug/kg/min via INTRAVENOUS
  Administered 2022-03-05: 50 ug/kg/min via INTRAVENOUS
  Filled 2022-03-03 (×2): qty 100
  Filled 2022-03-03: qty 200
  Filled 2022-03-03 (×4): qty 100

## 2022-03-03 MED ORDER — FENTANYL CITRATE (PF) 100 MCG/2ML IJ SOLN
INTRAMUSCULAR | Status: AC
  Filled 2022-03-03: qty 2

## 2022-03-03 MED ORDER — ACETAMINOPHEN 650 MG RE SUPP: 650.0000 mg | Freq: Four times a day (QID) | RECTAL | Status: DC | PRN

## 2022-03-03 MED ORDER — GUAIFENESIN-DM 100-10 MG/5ML PO SYRP
10.0000 mL | ORAL_SOLUTION | ORAL | Status: DC | PRN
  Administered 2022-03-05: 10 mL
  Filled 2022-03-03: qty 10

## 2022-03-03 MED ORDER — ORAL CARE MOUTH RINSE
15.0000 mL | OROMUCOSAL | Status: DC
  Administered 2022-03-03 – 2022-03-05 (×23): 15 mL via OROMUCOSAL

## 2022-03-03 MED ORDER — FENTANYL CITRATE PF 50 MCG/ML IJ SOSY
25.0000 ug | PREFILLED_SYRINGE | INTRAMUSCULAR | Status: DC | PRN
  Administered 2022-03-03 – 2022-03-04 (×4): 50 ug via INTRAVENOUS
  Administered 2022-03-04 – 2022-03-05 (×5): 100 ug via INTRAVENOUS
  Filled 2022-03-03 (×3): qty 1
  Filled 2022-03-03 (×2): qty 2
  Filled 2022-03-03: qty 1
  Filled 2022-03-03 (×3): qty 2

## 2022-03-03 MED ORDER — FAMOTIDINE 20 MG PO TABS
20.0000 mg | ORAL_TABLET | Freq: Two times a day (BID) | ORAL | Status: DC
  Administered 2022-03-03 (×2): 20 mg
  Filled 2022-03-03 (×3): qty 1

## 2022-03-03 MED ORDER — MIDAZOLAM HCL 2 MG/2ML IJ SOLN
1.0000 mg | INTRAMUSCULAR | Status: DC | PRN
  Administered 2022-03-03 – 2022-03-05 (×9): 2 mg via INTRAVENOUS
  Filled 2022-03-03 (×9): qty 2

## 2022-03-03 MED ORDER — POLYETHYLENE GLYCOL 3350 17 G PO PACK
17.0000 g | PACK | Freq: Every day | ORAL | Status: DC
  Administered 2022-03-04 – 2022-03-05 (×2): 17 g
  Filled 2022-03-03 (×3): qty 1

## 2022-03-03 MED ORDER — FENTANYL CITRATE PF 50 MCG/ML IJ SOSY: 25.0000 ug | PREFILLED_SYRINGE | INTRAMUSCULAR | Status: DC | PRN

## 2022-03-03 MED ORDER — ROCURONIUM BROMIDE 10 MG/ML (PF) SYRINGE
PREFILLED_SYRINGE | INTRAVENOUS | Status: AC
  Administered 2022-03-03: 50 mg
  Filled 2022-03-03: qty 10

## 2022-03-03 MED ORDER — SODIUM CHLORIDE 0.9% FLUSH
10.0000 mL | Freq: Three times a day (TID) | INTRAVENOUS | Status: DC
  Administered 2022-03-03 – 2022-03-05 (×6): 10 mL via INTRAPLEURAL

## 2022-03-03 MED ORDER — CARVEDILOL 12.5 MG PO TABS: 12.5000 mg | ORAL_TABLET | Freq: Two times a day (BID) | ORAL | Status: DC

## 2022-03-03 MED ORDER — ASPIRIN 81 MG PO CHEW
81.0000 mg | CHEWABLE_TABLET | Freq: Every day | ORAL | Status: DC
  Administered 2022-03-04 – 2022-03-05 (×2): 81 mg
  Filled 2022-03-03 (×2): qty 1

## 2022-03-03 MED ORDER — ATORVASTATIN CALCIUM 40 MG PO TABS
40.0000 mg | ORAL_TABLET | Freq: Every day | ORAL | Status: DC
  Administered 2022-03-04 – 2022-03-05 (×2): 40 mg
  Filled 2022-03-03 (×2): qty 1

## 2022-03-03 MED ORDER — KETAMINE HCL 50 MG/5ML IJ SOSY
PREFILLED_SYRINGE | INTRAMUSCULAR | Status: AC
  Filled 2022-03-03: qty 10

## 2022-03-03 MED ORDER — ORAL CARE MOUTH RINSE: 15.0000 mL | OROMUCOSAL | Status: DC | PRN

## 2022-03-03 MED ORDER — SUCCINYLCHOLINE CHLORIDE 200 MG/10ML IV SOSY
PREFILLED_SYRINGE | INTRAVENOUS | Status: AC
  Filled 2022-03-03: qty 10

## 2022-03-03 MED ORDER — METHYLPREDNISOLONE SODIUM SUCC 40 MG IJ SOLR
40.0000 mg | Freq: Two times a day (BID) | INTRAMUSCULAR | Status: AC
  Administered 2022-03-03 – 2022-03-04 (×3): 40 mg via INTRAVENOUS
  Filled 2022-03-03 (×3): qty 1

## 2022-03-03 MED ORDER — DOCUSATE SODIUM 50 MG/5ML PO LIQD
100.0000 mg | Freq: Two times a day (BID) | ORAL | Status: DC
  Administered 2022-03-03 – 2022-03-05 (×4): 100 mg
  Filled 2022-03-03 (×5): qty 10

## 2022-03-03 MED ORDER — FENTANYL CITRATE PF 50 MCG/ML IJ SOSY
PREFILLED_SYRINGE | INTRAMUSCULAR | Status: AC
  Administered 2022-03-03: 50 ug via INTRAVENOUS
  Filled 2022-03-03: qty 2

## 2022-03-03 MED ORDER — PREDNISONE 20 MG PO TABS
50.0000 mg | ORAL_TABLET | Freq: Every day | ORAL | Status: DC
  Filled 2022-03-03: qty 1

## 2022-03-03 MED ORDER — ETOMIDATE 2 MG/ML IV SOLN
INTRAVENOUS | Status: AC
  Administered 2022-03-03: 40 mg
  Filled 2022-03-03: qty 20

## 2022-03-03 NOTE — Progress Notes (Signed)
    1329 Versed '2mg'$   1329 Fent 143mg  1332 '20mg'$  etomidate  1333 10 etomidate 1333 568m fent 1334 '50mg'$  Roc 1335 intubated 7.5 ETT 24'@lip'$  + color change OG at 65 CXR awaiting

## 2022-03-03 NOTE — Progress Notes (Signed)
Social/Ethics-- pt is a retired Music therapist, he was previously a DNR.... He requested to be intubated and rescinded his DNR to allow for further workup and treatment of his pulmonary issues at this time including possible consultation with CT surgery and possible VATS procedure if needed -However patient would not like to be kept alive on the ventilator for prolonged period of time if likelihood of meaningful recovery was low -Patient's legal wife/spouse has cognitive and speech challenges due to prior stroke -Patient's advanced directive discussed with his daughter Ms Marcie Mowers answered -At this time patient is a Full code  -Transfer to [facility] ICU -Dr. Gabriel Rung service attending  Roxan Hockey, MD

## 2022-03-03 NOTE — Procedures (Signed)
Intubation Procedure Note  Christopher Booth  [id number]  April 03, 1940  Date:03/03/22  Time:1:50 PM   Provider Performing:Hanadi Stanly V. Donella Pascarella    Procedure: Intubation (31500)  Indication(s) Respiratory Failure  Consent Risks of the procedure as well as the alternatives and risks of each were explained to the patient and/or caregiver.  Consent for the procedure was obtained and is signed in the bedside chart   Anesthesia Etomidate, Versed, Fentanyl, and Rocuronium   Time Out Verified patient identification, verified procedure, site/side was marked, verified correct patient position, special equipment/implants available, medications/allergies/relevant history reviewed, required imaging and test results available.   Sterile Technique Usual hand hygeine, masks, and gloves were used   Procedure Description Patient positioned in bed supine.  Sedation given as noted above.  Patient was intubated with endotracheal tube using Glidescope.  View was Grade 1 full glottis .  Number of attempts was 1.  Colorimetric CO2 detector was consistent with tracheal placement.   Complications/Tolerance None; patient tolerated the procedure well. Chest X-ray is ordered to verify placement.   EBL Minimal   Specimen(s) None  Christopher Hruska V. Elsworth Soho MD

## 2022-03-03 NOTE — Progress Notes (Addendum)
Progress Note   Patient: Christopher Booth BUL:[id number] DOB: 10/27/1940 DOA: 03/21/2022     2 DOS: the patient was seen and examined on 03/03/2022   Brief hospital course: As per H&P written by Dr. [name] on 03/06/2022 KEVAN PROUTY is a 82 y.o. male with medical history significant for  CABG, HTN.  Patient presented to the ED via EMS with complaints of worsening difficulty breathing that started today. Patient was just discharged from the hospital yesterday.  He has had 2 hospitalizations in January-related to lung infection-COVID and bacterial.   Hospitalization 1/21 to 1/23-for sepsis with COVID and concomitant bacterial infection.  Completed 3 days of baricitinib while inpatient, antibiotics inpatient, discharged to complete course of antibiotics at home, per notes he did not take his Augmentin on discharge.   Rehospitalization 1/28 to 2/4-acute hypoxic respiratory failure in the setting of recent COVID infection.  CT findings demonstrating ongoing COVID infection, acute lung injury.  Procalcitonin was low so antibiotics were discontinued.  Pulmonology was consulted, recommended long steroid taper.  He was discharged on 5 L to follow-up with pulmonology.   ED Course: O2 sats down to 73% on room air on arrival, on nonrebreather.  Temperature 98.6.  Tachycardic heart rate initially up to 116.-Improved after thoracentesis (rate- 83 - 116).  Tachypnea heart rate 27- 36.  Blood pressure systolic 321Y to 248G.  Lactic acidosis 2.5 > 2.1.  Troponin 11 > 12. Leukocytosis of 15.4. Chest x-ray showing-left tension pneumothorax with collapse of left lung. General surgery consulted underwent chest tube insertion.  Subsequent chest x-ray showed decreasing left-sided pneumothorax with minimal residual left apical pneumothorax.  Also multifocal bilateral airspace opacities. Broad-spectrum antibiotics IV Vanco and cefepime started, 125 mg Solu-Medrol given. Subsequently placed on 6 L nasal cannula and O2 sats  dropped to 80s.  Assessment and Plan: 1)Lt sided  Spontaneous pneumothorax --Chest tube in situ -Ongoing management by general surgery and pulmonologist -Repeat chest x-ray on 03/03/2022 noted  2)Acute respiratory failure with hypoxia -due to #1 above --03/03/22- Significant hypoxia with high oxygen requirement persist He is currently maintaining 88-90% on HFNC at  100% and 40L   3)Recent COVID-19 virus infection -Recent COVID-19 infection diagnosed 02/13/22.  Completed 3-day course of baricitinib  -Continue steroids -Continue supportive care -Pulmonology consult appreciated -No need for further airborne isolation  4)Multifocal pneumonia 2 Recent hospitalization in the setting of COVID infection and bacterial pneumonia within the past 2 weeks or so -Left-sided spontaneous pneumothorax as above -Depending clinical improvement patient may require transfer to [facility] for cardiothoracic surgery and he is open to being intubated if needed for acute management.   - 03/03/22 PCT < 0.10 >> 0.49 >> 0.34 -Continue IV cefepime, bronchodilators, mucolytics -c/n  IV Solu-Medrol and IV vancomycin  5)Hypokalemia -Replaced and normalized  6)HTN- -BP is improved okay to restart Coreg but continue to hold losartan  7)CAD--- history of prior CABG - chest pain-free -Continue aspirin, Coreg and Lipitor  8) social/ethics--- DNR/DNI, no further significant limitations to treatment otherwise  9)DM2-A1c 6.7 reflecting good diabetic control PTA\ Use Novolog/Humalog Sliding scale insulin with Accu-Cheks/Fingersticks as ordered   Subjective: . -Dyspnea and Worsening -RT at bedside, oxygen requirement increasing -He is currently maintaining 88-90% on HFNC at  100% and 40L   Physical Exam: Vitals:   03/03/22 0904 03/03/22 0905 03/03/22 0906 03/03/22 1019  BP:      Pulse: 73 70 79 81  Resp: (!) 26 (!) 23 (!) 26 (!) 31  Temp:  TempSrc:      SpO2: 94% 94% 94% (!) 83%  Weight:       Height:        Physical Exam  Gen:- Awake Alert, in no acute distress  HEENT:- Ward.AT, No sclera icterus Nose- HFNC  Neck-Supple Neck,No JVD,.  Lungs-diminished breath especially on the left, left-sided chest tube noted  CV- S1, S2 normal, irregular , prior sternotomy scar abd-  +ve B.Sounds, Abd Soft, No tenderness,    Extremity/Skin:- No  edema,   good pedal pulses  Psych-affect is appropriate, oriented x3 Neuro-no new focal deficits, no tremors   Family Communication: No family at bedside.  Disposition:-Anticipate discharge home with home health once medically stable  Status is: Inpatient Remains inpatient appropriate because: Continue treatment for acute respiratory failure with hypoxia in the setting of a spontaneous pneumothorax.   Planned Discharge Destination: Hopefully Home with Home Health  Author: Roxan Hockey, MD 03/03/2022 11:14 AM  For on call review [email or web address].

## 2022-03-03 NOTE — Progress Notes (Signed)
PCCM interval progress note:  Pt arrived from [facility] stable on ventilator.  Chest tube has an air leak, vitals stable.  Repeat CXR in the morning, continue antibiotics and steroids.    Otilio Carpen Sherelle Castelli, PA-C [facility] See Amion for pager If no response to pager , please call [phone] until 7pm After 7:00 pm call Elink  [phone]?[phone]

## 2022-03-03 NOTE — Progress Notes (Signed)
Patient transferred to [facility]. Daughter Olivia Mackie made aware. Dr. Joesph Fillers confirmed that is a full code prior to transport per patient's wishes to only be left unresponsive if found that way and requesting intubation. Report given to [facility].

## 2022-03-03 NOTE — Inpatient Diabetes Management (Signed)
Inpatient Diabetes Program Recommendations  AACE/ADA: New Consensus Statement on Inpatient Glycemic Control (2015)  Target Ranges:  Prepandial:   less than 140 mg/dL      Peak postprandial:   less than 180 mg/dL (1-2 hours)      Critically ill patients:  140 - 180 mg/dL   Lab Results  Component Value Date   GLUCAP 136 (H) 03/03/2022   HGBA1C 6.7 (H) 02/24/2022    Review of Glycemic Control  Latest Reference Range & Units 03/02/22 05:48 03/02/22 07:36 03/02/22 16:02 03/03/22 00:33 03/03/22 09:12  Glucose-Capillary 70 - 99 mg/dL 161 (H) 132 (H) 311 (H) 177 (H) 136 (H)   COVID/pneumothorax Diabetes history: DM 2 Outpatient Diabetes medications: Tradjenta 5 mg Daily Current orders for Inpatient glycemic control:  Novolog 0-9 units Q8 hours  Solumedrol 40 mg Q12 hours  Inpatient Diabetes Program Recommendations:   Glucose elevated in the evening times. All other readings okay.  -  May consider increasing frequency of Novolog Correction to Q6 hours.  Thanks,  Tama Headings RN, MSN, BC-ADM Inpatient Diabetes Coordinator Team Pager [phone] (8a-5p)

## 2022-03-03 NOTE — Progress Notes (Signed)
[facility] Progress Note     Subjective: Patient seen and examined.  He is resting in bed on HFNC and nonrebreather.  He is saturating in the high 80s, and desaturates to low 80s when talking.  He feels tired and is requesting transfer to [facility] for intubation.  His chest tube has had 100 cc of serosanguinous output in the last 24 hours.  He has a 1 column air leak with coughing. CXR still demonstrates stable apical pneumothorax.  Objective: Vital signs in last 24 hours: Temp:  [97.8 F (36.6 C)-98.6 F (37 C)] 98.5 F (36.9 C) (02/07 1114) Pulse Rate:  [63-84] 81 (02/07 1019) Resp:  [14-31] 31 (02/07 1019) BP: (116-167)/(52-113) 140/113 (02/07 0730) SpO2:  [83 %-100 %] 83 % (02/07 1019) FiO2 (%):  [100 %] 100 % (02/07 1019) Last BM Date : 03/02/22  Intake/Output from previous day: 02/06 0701 - 02/07 0700 In: 1417.6 [P.O.:510; I.V.:707.5; IV Piggyback:200.1] Out: 9563 [Urine:1250; Chest Tube:104] Intake/Output this shift: No intake/output data recorded.  General appearance: alert and moderate distress Chest wall: left chest tube site clean, serosanguinous output in cannister, 1 column air leak with cough  Lab Results:  Recent Labs    02/25/2022 1600 03/02/22 0445  WBC 15.4* 13.1*  HGB 13.8 12.6*  HCT 40.8 37.8*  PLT 296 254   BMET Recent Labs    03/08/2022 1600 03/02/22 0445  NA 136 136  K 3.4* 4.1  CL 103 109  CO2 24 20*  GLUCOSE 154* 183*  BUN 28* 26*  CREATININE 0.87 0.77  CALCIUM 7.4* 6.9*   PT/INR Recent Labs    03/18/2022 1445  LABPROT 13.9  INR 1.1    Studies/Results: DG CHEST PORT 1 VIEW  Result Date: 03/03/2022 CLINICAL DATA:  Dyspnea EXAM: PORTABLE CHEST 1 VIEW COMPARISON:  03/03/2022 at 0752 hours FINDINGS: Left chest tube remains in place. Stable cardiomediastinal contours status post sternotomy and CABG. Stable size of a small left apical pneumothorax. Air density at the medial aspect of the left hemithorax may be related  to pneumothorax versus pneumomediastinum. This is also stable in appearance. Extensive bilateral airspace opacities, most confluent within the left lower lobe, unchanged. IMPRESSION: 1. Stable size of small left apical pneumothorax. Left chest tube remains in place. 2. Air density at the medial aspect of the left hemithorax may be related to pneumothorax versus pneumomediastinum. This is also stable in appearance. Electronically Signed   By: Davina Poke D.O.   On: 03/03/2022 09:43   DG Chest Port 1 View  Result Date: 03/03/2022 CLINICAL DATA:  Chest tube in place. EXAM: PORTABLE CHEST 1 VIEW COMPARISON:  March 02, 2022. FINDINGS: Stable cardiomediastinal silhouette. Status post coronary bypass graft. Stable position of left-sided chest tube with stable left-sided pneumothorax. Stable bilateral lung opacities. Bony thorax is unremarkable. IMPRESSION: Stable position of left-sided chest tube is stable left-sided pneumothorax. Stable bilateral lung opacities. Electronically Signed   By: Marijo Conception M.D.   On: 03/03/2022 08:15   DG Chest Port 1 View  Result Date: 03/02/2022 CLINICAL DATA:  History of pneumothorax.  Chest tube. EXAM: PORTABLE CHEST 1 VIEW COMPARISON:  Chest x-ray 03/19/2022.  Chest CT 02/21/2022. FINDINGS: Prior CABG. Heart size stable. Left chest tube in stable position. Unchanged left apical pneumothorax. Medial left pneumothorax versus pneumomediastinum again noted without interim change. Multifocal bilateral pulmonary infiltrates again noted without interim change. No pleural effusion. No acute bony abnormalities identified. IMPRESSION: 1. Prior CABG. Heart size stable. 2. Left  chest tube in stable position. Unchanged left apical pneumothorax. Medial left pneumothorax versus pneumomediastinum again noted without interim change. 3. Multifocal bilateral pulmonary infiltrates again noted without interim change. Electronically Signed   By: Marcello Moores  Register M.D.   On: 03/02/2022 07:22    DG Chest Port 1 View  Result Date: 03/03/2022 CLINICAL DATA:  [id number] Chest tube in place [id number] EXAM: PORTABLE CHEST 1 VIEW COMPARISON:  March 01, 2022 FINDINGS: The cardiomediastinal silhouette is unchanged in contour.LEFT chest tube is in place. No pleural effusion. Interval decrease in LEFT-sided pneumothorax with minimal residual LEFT apical pneumothorax. There is pneumomediastinum. Multifocal bilateral airspace opacities, most confluent in the RIGHT upper lobe and LEFT lung base. IMPRESSION: 1. Interval decrease in LEFT-sided pneumothorax with minimal residual LEFT apical pneumothorax. 2. Multifocal bilateral airspace opacities, most confluent in the RIGHT upper lobe and LEFT lung base. 3. Pneumomediastinum. Electronically Signed   By: Valentino Saxon M.D.   On: 03/10/2022 15:36   DG Chest Port 1 View  Result Date: 03/24/2022 CLINICAL DATA:  Shortness of breath EXAM: PORTABLE CHEST 1 VIEW COMPARISON:  Portable exam 1410 hours compared to 02/24/2022 FINDINGS: Large LEFT pneumothorax with mediastinal shift to RIGHT. Findings are consistent with LEFT tension pneumothorax. Normal heart size post CABG. Patchy infiltrate RIGHT lung. Atelectatic LEFT lung. No pleural effusion. LEFT nipple shadow noted. IMPRESSION: LEFT tension pneumothorax with collapse of LEFT lung. Patchy infiltrates RIGHT lung. Critical Value/emergent results were called by telephone at the time of interpretation on 03/18/2022 at 2:20 pm to provider Broadus John ZAMMIT MD, who verbally acknowledged these results. Electronically Signed   By: Lavonia Dana M.D.   On: 02/27/2022 14:20    Anti-infectives: Anti-infectives (From admission, onward)    Start     Dose/Rate Route Frequency Ordered Stop   03/03/2022 2200  ceFEPIme (MAXIPIME) 2 g in sodium chloride 0.9 % 100 mL IVPB        2 g 200 mL/hr over 30 Minutes Intravenous Every 8 hours 03/09/2022 1505     03/20/2022 1600  vancomycin (VANCOREADY) IVPB 1500 mg/300 mL        1,500 mg 150 mL/hr  over 120 Minutes Intravenous Every 24 hours 03/04/2022 1505     02/26/2022 1500  vancomycin (VANCOREADY) IVPB 1500 mg/300 mL  Status:  Discontinued        1,500 mg 150 mL/hr over 120 Minutes Intravenous  Once 02/26/2022 1437 02/28/2022 1505   03/23/2022 1415  vancomycin (VANCOCIN) IVPB 1000 mg/200 mL premix  Status:  Discontinued        1,000 mg 200 mL/hr over 60 Minutes Intravenous  Once 03/08/2022 1405 03/16/2022 1437   03/07/2022 1415  ceFEPIme (MAXIPIME) 2 g in sodium chloride 0.9 % 100 mL IVPB        2 g 200 mL/hr over 30 Minutes Intravenous  Once 03/17/2022 1405 03/18/2022 1612       Assessment/Plan:  Patient is an 82 year old male who was admitted status post left-sided chest tube placement for spontaneous pneumothorax and recent admissions for COVID and bilateral pneumonia.  -Patient is having increasing oxygen requirements this morning and is still having episodes of desaturation -Chest x-ray with stable apical pneumothorax -1 column air leak noted with cough -Patient has requested transfer to [facility] and intubation -I would leave chest tube on -20 mmHg of suction given persistent pneumothorax, air leak, and plan for intubation -Appreciate pulmonology/critical care recommendations -Would consider getting thoracic surgery involved if patient continues to have persistent air leak -Care  per primary team   LOS: 2 days    [place] 03/03/2022

## 2022-03-03 NOTE — Progress Notes (Addendum)
-  Patient continued to have significant decline in respiratory status with increasing oxygen requirements - After conference with PCCM attending, Dr Elsworth Soho ,  general surgeon Dr. Andris Flurry, bedside RN, respiratory therapist and patient himself ... decision was made with patient to go ahead and intubate and transfer to [facility] ICU - -Serial chest x-ray shows left-sided chest tube in situ, however Patient with small but persistent air leak Patient will most likely need CT surgery consult for possible VATS - Further management per critical care team at [place] significant input from PCCM attending Dr. Elsworth Soho who actually intubated patient himself  - Patient is now intubated and sedated  Social/Ethics-- pt is a retired Music therapist, he was previously a DNR.... He requested to be intubated and rescinded his DNR to allow for further workup and treatment of his pulmonary issues at this time including possible consultation with CT surgery and possible VATS procedure if needed -However patient would not like to be kept alive on the ventilator for prolonged period of time if likelihood of meaningful recovery was low. -Patient's legal wife/spouse has cognitive and speech challenges due to prior stroke -Patient's advanced directive discussed with his daughter Ms Marcie Mowers answered -At this time patient is a Full code  Dispo----Transfer to [facility] ICU -Dr. Gabriel Rung service attending  CRITICAL CARE Performed by: Roxan Hockey  Patient is now intubated and sedated  Vent settings:- PRVC/100%/5/24/510 TV  Total critical care time: 43 minutes  Critical care time was exclusive of separately billable procedures and treating other patients.  Critical care was necessary to treat or prevent imminent or life-threatening deterioration.  Critical care was time spent personally by me on the following activities: development of treatment plan  with patient and/or surrogate as well as nursing, discussions with consultants, evaluation of patient's response to treatment, examination of patient, obtaining history from patient or surrogate, ordering and performing treatments and interventions, ordering and review of laboratory studies, ordering and review of radiographic studies, pulse oximetry and re-evaluation of patient's condition.  -Total critical care time 43 minutes  Roxan Hockey, MD

## 2022-03-03 NOTE — Progress Notes (Signed)
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  Left pigtail was placed with partial expansion of left lung.  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 Saturation around 92% Had a rough morning. Patient requested intubation  Objective   Blood pressure (!) 140/113, pulse 81, temperature 98.5 F (36.9 C), temperature source Axillary, resp. rate (!) 31, height '5\' 6"'$  (1.676 m), weight 75.9 kg, SpO2 (!) 83 %.    Vent Mode: PRVC FiO2 (%):  [100 %] 100 % Set Rate:  [24 bmp] 24 bmp Vt Set:  [510 mL-550 mL] 510 mL PEEP:  [5 cmH20] 5 cmH20 Plateau Pressure:  [23 cmH20] 23 cmH20   Intake/Output Summary (Last 24 hours) at 03/03/2022 1416 Last data filed at 03/03/2022 1100 Gross per 24 hour  Intake 240 ml  Output 1129 ml  Net -889 ml    Filed Weights   03/02/2022 2040  Weight: 75.9 kg    Examination: General: Elderly man, mild distress  well-built and well-nourished HENT: No pallor, icterus, no JVD, Lungs: Decreased breath sounds on left, no chest wall crepitus, + accessory muscle use, airleak + 1 chest tube Cardiovascular: S1-S2 regular, no murmur Abdomen: Soft, nontender, no hepatosplenomegaly Extremities: No edema, no deformity Neuro: Alert, interactive, right foot drop GU: Clear urine  Chest x-ray independently reviewed shows partial expansion of left lung with left medial pneumothorax and small apical pneumothorax  Labs show normal electrolytes, low procalcitonin, decreased leukocytosis  Resolved Hospital Problem list     Assessment & Plan:  Acute respiratory failure with hypoxia  -Patient reports being tired, on high heated high flow nasal cannula and nonrebreather with desaturation. -Proceed with intubation, low tidal volume ventilation, plateau pressure less than 30-pressure less than 15.  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 -Was treated with baricitinib on initial admit  -Continue Solu-Medrol 40 every 12.  left spontaneous pneumothorax -left lung appears partially expanded with persistent medial pneumothorax  -CT chest without contrast when stable -Flush pigtail twice daily   CAD, hypertension -continue Coreg, hold if blood pressure soft Best Practice (right click and "Reselect all SmartList Selections" daily)  Transferred to [facility] arranged, informed critical care team Initiate tube feeds Subcu Lovenox Code Status:  DNR Last date of multidisciplinary goals of care discussion [name] is the caregiver for his wife who had a stroke.  He requests DNR status should he be found without pulse but agrees to full medical care  otherwise including mechanical ventilation if required, he designates his daughter as HCPOA  Labs   CBC: Recent Labs  Lab 03/17/2022 1600  03/02/22 0445  WBC 15.4* 13.1*  NEUTROABS 14.3*  --   HGB 13.8 12.6*  HCT 40.8 37.8*  MCV 89.3 90.2  PLT 296 254     Basic Metabolic Panel: Recent Labs  Lab 02/25/22 0432 02/26/22 0432 02/27/22 0504 03/12/2022 1600 03/02/22 0445  NA 131* 135 136 136 136  K 4.1 4.4 3.9 3.4* 4.1  CL 102 102 107 103 109  CO2 '22 23 22 24 '$ 20*  GLUCOSE 192* 199* 221* 154* 183*  BUN 28* 26* 29* 28* 26*  CREATININE 0.88 0.86 0.87 0.87 0.77  CALCIUM 7.8* 7.6* 7.6* 7.4* 6.9*    GFR: Estimated Creatinine Clearance: 65.4 mL/min (by C-G formula based on SCr of 0.77 mg/dL). Recent Labs  Lab 03/18/2022 1445 03/09/2022 1600 03/02/22 0445 03/03/22 0356  PROCALCITON  --  <0.10 0.49 0.34  WBC  --  15.4* 13.1*  --   LATICACIDVEN 2.5* 2.1*  --   --      Liver Function Tests: Recent Labs  Lab 02/25/22 0432 02/26/22 0432 02/27/22 0504 03/11/2022 1600  AST '31 26 25 24  '$ ALT 64* 60* 54* 48*  ALKPHOS 76 77 83 90  BILITOT 0.8 0.8 1.2 1.8*  PROT 5.8* 5.7* 5.5* 5.8*  ALBUMIN 2.4* 2.3* 2.3* 2.4*    No results for input(s): "LIPASE", "AMYLASE" in the last 168 hours. No results for input(s): "AMMONIA" in the last 168 hours.  ABG    Component Value Date/Time   PHART 7.48 (H) 02/23/2022 0922   PCO2ART 28 (L) 02/23/2022 0922   PO2ART 61 (L) 02/23/2022 0922   HCO3 20.9 02/23/2022 0922   TCO2 22 01/13/2017 1610   ACIDBASEDEF 1.4 02/23/2022 0922   O2SAT 93 02/23/2022 0922     Coagulation Profile: Recent Labs  Lab 02/25/2022 1445  INR 1.1     Cardiac Enzymes: No results for input(s): "CKTOTAL", "CKMB", "CKMBINDEX", "TROPONINI" in the last 168 hours.  HbA1C: Hgb A1c MFr Bld  Date/Time Value Ref Range Status  02/24/2022 11:30 AM 6.7 (H) 4.8 - 5.6 % Final    Comment:    (NOTE) Pre diabetes:          5.7%-6.4%  Diabetes:              >6.4%  Glycemic control for   <7.0% adults with diabetes   08/10/2021 02:34 PM 5.9 (H) <5.7 % of total Hgb Final    Comment:    For someone without known  diabetes, a hemoglobin  A1c value between 5.7% and 6.4% is consistent with prediabetes and should be confirmed with a  follow-up test. . For someone with known diabetes, a value <7% indicates that their diabetes is well controlled. A1c targets should be individualized based on duration of diabetes, age, comorbid conditions, and other considerations. . This assay result is consistent with an increased risk of diabetes. . Currently, no consensus exists regarding use of hemoglobin A1c for diagnosis of diabetes for children. .     CBG: Recent Labs  Lab 03/02/22 0548 03/02/22 0736 03/02/22 1602 03/03/22 0033 03/03/22 0912  GLUCAP 161* 132* 311* 177* 136*    My independent critical care time was 50 minutes  Kara Mead MD. FCCP. [facility] Pager : [phone]  If no response to pager , please call [phone] until 7 pm After 7:00 pm call  [place]  [phone]   03/03/2022

## 2022-03-04 ENCOUNTER — Inpatient Hospital Stay (HOSPITAL_COMMUNITY): Payer: PPO

## 2022-03-04 DIAGNOSIS — J9383 Other pneumothorax: Secondary | ICD-10-CM | POA: Diagnosis not present

## 2022-03-04 LAB — BLOOD GAS, ARTERIAL
Acid-base deficit: 2.2 mmol/L — ABNORMAL HIGH (ref 0.0–2.0)
Bicarbonate: 22.4 mmol/L (ref 20.0–28.0)
O2 Saturation: 96.8 %
Patient temperature: 36.6
pCO2 arterial: 36 mmHg (ref 32–48)
pH, Arterial: 7.4 (ref 7.35–7.45)
pO2, Arterial: 78 mmHg — ABNORMAL LOW (ref 83–108)

## 2022-03-04 LAB — CBC WITH DIFFERENTIAL/PLATELET
Abs Immature Granulocytes: 0.1 10*3/uL — ABNORMAL HIGH (ref 0.00–0.07)
Basophils Absolute: 0 10*3/uL (ref 0.0–0.1)
Basophils Relative: 0 %
Eosinophils Absolute: 0 10*3/uL (ref 0.0–0.5)
Eosinophils Relative: 0 %
HCT: 37.3 % — ABNORMAL LOW (ref 39.0–52.0)
Hemoglobin: 12.9 g/dL — ABNORMAL LOW (ref 13.0–17.0)
Immature Granulocytes: 1 %
Lymphocytes Relative: 2 %
Lymphs Abs: 0.3 10*3/uL — ABNORMAL LOW (ref 0.7–4.0)
MCH: 31.1 pg (ref 26.0–34.0)
MCHC: 34.6 g/dL (ref 30.0–36.0)
MCV: 89.9 fL (ref 80.0–100.0)
Monocytes Absolute: 0.2 10*3/uL (ref 0.1–1.0)
Monocytes Relative: 2 %
Neutro Abs: 13.8 10*3/uL — ABNORMAL HIGH (ref 1.7–7.7)
Neutrophils Relative %: 95 %
Platelets: 174 10*3/uL (ref 150–400)
RBC: 4.15 MIL/uL — ABNORMAL LOW (ref 4.22–5.81)
RDW: 12.9 % (ref 11.5–15.5)
WBC: 14.4 10*3/uL — ABNORMAL HIGH (ref 4.0–10.5)
nRBC: 0 % (ref 0.0–0.2)

## 2022-03-04 LAB — BASIC METABOLIC PANEL
Anion gap: 8 (ref 5–15)
BUN: 28 mg/dL — ABNORMAL HIGH (ref 8–23)
CO2: 22 mmol/L (ref 22–32)
Calcium: 7.4 mg/dL — ABNORMAL LOW (ref 8.9–10.3)
Chloride: 105 mmol/L (ref 98–111)
Creatinine, Ser: 0.7 mg/dL (ref 0.61–1.24)
GFR, Estimated: >60 mL/min (ref >60)
Glucose, Bld: 176 mg/dL — ABNORMAL HIGH (ref 70–99)
Potassium: 4.7 mmol/L (ref 3.5–5.1)
Sodium: 135 mmol/L (ref 135–145)

## 2022-03-04 LAB — GLUCOSE, CAPILLARY
Glucose-Capillary: 185 mg/dL — ABNORMAL HIGH (ref 70–99)
Glucose-Capillary: 177 mg/dL — ABNORMAL HIGH (ref 70–99)
Glucose-Capillary: 157 mg/dL — ABNORMAL HIGH (ref 70–99)
Glucose-Capillary: 202 mg/dL — ABNORMAL HIGH (ref 70–99)
Glucose-Capillary: 253 mg/dL — ABNORMAL HIGH (ref 70–99)
Glucose-Capillary: 308 mg/dL — ABNORMAL HIGH (ref 70–99)
Glucose-Capillary: 258 mg/dL — ABNORMAL HIGH (ref 70–99)

## 2022-03-04 LAB — MAGNESIUM
Magnesium: 2.2 mg/dL (ref 1.7–2.4)
Magnesium: 2.2 mg/dL (ref 1.7–2.4)
Magnesium: 2.1 mg/dL (ref 1.7–2.4)

## 2022-03-04 LAB — PHOSPHORUS
Phosphorus: 3.1 mg/dL (ref 2.5–4.6)
Phosphorus: 3 mg/dL (ref 2.5–4.6)
Phosphorus: 3.3 mg/dL (ref 2.5–4.6)

## 2022-03-04 LAB — TRIGLYCERIDES: Triglycerides: 110 mg/dL (ref <150)

## 2022-03-04 MED ORDER — CARVEDILOL 3.125 MG PO TABS
6.2500 mg | ORAL_TABLET | Freq: Two times a day (BID) | ORAL | Status: DC
  Administered 2022-03-05: 6.25 mg
  Filled 2022-03-04: qty 2

## 2022-03-04 MED ORDER — ADULT MULTIVITAMIN W/MINERALS CH
1.0000 | ORAL_TABLET | Freq: Every day | ORAL | Status: DC
  Administered 2022-03-04 – 2022-03-05 (×2): 1
  Filled 2022-03-04 (×2): qty 1

## 2022-03-04 MED ORDER — OSMOLITE 1.5 CAL PO LIQD
1000.0000 mL | ORAL | Status: DC
  Administered 2022-03-04 – 2022-03-05 (×3): 1000 mL
  Filled 2022-03-04 (×2): qty 1000

## 2022-03-04 MED ORDER — THIAMINE MONONITRATE 100 MG PO TABS
100.0000 mg | ORAL_TABLET | Freq: Every day | ORAL | Status: DC
  Administered 2022-03-04 – 2022-03-05 (×2): 100 mg
  Filled 2022-03-04 (×2): qty 1

## 2022-03-04 MED ORDER — SODIUM CHLORIDE 0.9 % IV SOLN: 250.0000 mL | INTRAVENOUS | Status: DC

## 2022-03-04 MED ORDER — VITAL HIGH PROTEIN PO LIQD: 1000.0000 mL | ORAL | Status: DC

## 2022-03-04 MED ORDER — PHENYLEPHRINE HCL-NACL 20-0.9 MG/250ML-% IV SOLN
25.0000 ug/min | INTRAVENOUS | Status: DC
  Administered 2022-03-05: 25 ug/min via INTRAVENOUS
  Administered 2022-03-05: 45 ug/min via INTRAVENOUS
  Filled 2022-03-04 (×2): qty 250

## 2022-03-04 MED ORDER — INSULIN ASPART 100 UNIT/ML IJ SOLN
0.0000 [IU] | INTRAMUSCULAR | Status: DC
  Administered 2022-03-04: 7 [IU] via SUBCUTANEOUS
  Administered 2022-03-04: 5 [IU] via SUBCUTANEOUS
  Administered 2022-03-05: 3 [IU] via SUBCUTANEOUS
  Administered 2022-03-05: 5 [IU] via SUBCUTANEOUS
  Administered 2022-03-05: 2 [IU] via SUBCUTANEOUS

## 2022-03-04 MED ORDER — ALBUMIN HUMAN 25 % IV SOLN
12.5000 g | Freq: Once | INTRAVENOUS | Status: AC
  Administered 2022-03-04: 12.5 g via INTRAVENOUS
  Filled 2022-03-04: qty 50

## 2022-03-04 MED ORDER — PANTOPRAZOLE SODIUM 40 MG IV SOLR
40.0000 mg | Freq: Two times a day (BID) | INTRAVENOUS | Status: DC
  Administered 2022-03-04 – 2022-03-05 (×3): 40 mg via INTRAVENOUS
  Filled 2022-03-04 (×3): qty 10

## 2022-03-04 MED ORDER — FUROSEMIDE 10 MG/ML IJ SOLN
40.0000 mg | Freq: Once | INTRAMUSCULAR | Status: AC
  Administered 2022-03-04: 40 mg via INTRAVENOUS
  Filled 2022-03-04: qty 4

## 2022-03-04 MED ORDER — PROSOURCE TF20 ENFIT COMPATIBL EN LIQD
60.0000 mL | Freq: Every day | ENTERAL | Status: DC
  Administered 2022-03-04 – 2022-03-05 (×2): 60 mL
  Filled 2022-03-04 (×2): qty 60

## 2022-03-04 NOTE — TOC Progression Note (Signed)
Transition of Care [facility]) - Progression Note    Patient Details  Name: Christopher Booth MRN: [id number] Date of Birth: [birth year]  Transition of Care [facility]) CM/SW [place], RN Phone Number: 03/04/2022, 11:37 AM  Clinical Narrative:     Patient continues on propofol vent support, ARDS picture He is being diuresed. 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 He is a full code and his daughter is HCPOA.  TOC will continue to follow for needs, recommendations, and transitions of care.   Expected Discharge Plan: [place] Barriers to Discharge: Continued Medical Work up  Expected Discharge Plan and Services In-house Referral: Clinical Social Work   Post Acute Care Choice: Resumption of Svcs/PTA Provider Living arrangements for the past 2 months: Single Family Home                           HH Arranged: RN, PT [facility] Agency: [place] [phone]) Date HH Agency Contacted: 03/02/22 Time [place]: [place] Representative spoke with at [place]: [place] ([place]) Interventions SDOH Screenings   Food Insecurity: No Food Insecurity (03/13/2022)  Housing: Low Risk  (03/18/2022)  Transportation Needs: No Transportation Needs (02/28/2022)  Utilities: Not At Risk (03/19/2022)  Alcohol Screen: Low Risk  (10/30/2020)  Depression (PHQ2-9): Low Risk  (11/12/2021)  Financial Resource Strain: Low Risk  (03/23/2022)  Physical Activity: Insufficiently Active (10/30/2020)  Social Connections: Socially Integrated (10/30/2020)  Stress: No Stress Concern Present (10/30/2020)  Tobacco Use: Low Risk  (02/28/2022)    Readmission Risk Interventions    03/02/2022    1:43 PM  Readmission Risk Prevention Plan  Transportation Screening Complete  HRI or [place] Complete  Social Work Consult for [place] Planning/Counseling Complete  Palliative Care Screening Not Applicable  Medication Review Press photographer) Complete

## 2022-03-04 NOTE — Progress Notes (Signed)
[place] Progress Note Patient Name: Christopher Booth DOB: 1940-11-06 MRN: [id number]   Date of Service  03/04/2022  HPI/Events of Note  Hypotension - BP = 79/59. No central venous line. No CVP. Likely related to sedation needed to maintain ventilator synchrony. LVEF = 55-60%. Albumin = 2.4.  eICU Interventions  Plan: 25% Albumin 12.5 gm IV now.  Phenylephrine IV infusion via PIV. Titrate to MAP >= 65.     Intervention Category Major Interventions: Hypotension - evaluation and management  [name] 03/04/2022, 10:30 PM

## 2022-03-04 NOTE — Progress Notes (Signed)
[place] Progress Note Patient Name: Christopher Booth DOB: [birth year] MRN: [id number]   Date of Service  03/04/2022  HPI/Events of Note  Hyperglycemia - Blood glucose = 253. Currently on Q 8 hour sensitive Novolog SSI. NPO.   eICU Interventions  Plan: Change to Q 4 hour sensitive Novolog SSI.     Intervention Category Major Interventions: Hyperglycemia - active titration of insulin therapy  Lysle Dingwall 03/04/2022, 7:51 PM

## 2022-03-04 NOTE — Progress Notes (Signed)
An USGPIV (ultrasound guided PIV) has been placed for short-term vasopressor infusion. A correctly placed ivWatch must be used when administering Vasopressors. Should this treatment be needed beyond 72 hours, central line access should be obtained.  It will be the responsibility of the bedside nurse to follow best practice to prevent extravasations.   

## 2022-03-04 NOTE — Progress Notes (Signed)
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  Left pigtail was placed with partial expansion of left lung.  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 rate (!) 24, height '5\' 6"'$  (1.676 m), weight 76.3 kg, SpO2 96 %.    Vent Mode: PRVC FiO2 (%):  [70 %-100 %] 70 % Set Rate:  [24 bmp] 24 bmp Vt Set:  [510 mL-550 mL] 510 mL PEEP:  [5 cmH20] 5 cmH20 Plateau Pressure:  [21 cmH20-23 cmH20] 21 cmH20   Intake/Output Summary (Last 24 hours) at 03/04/2022 8250 Last data filed at 03/04/2022 0600 Gross per 24 hour  Intake 1302.52 ml  Output 750 ml  Net 552.52 ml   Filed Weights   03/04/2022 2040 03/03/22 1745  Weight: 75.9 kg 76.3 kg    Examination: General appearance: 82 y.o., male,intubated  Eyes: PERRL, tracking appropriately Neck: Trachea midline; no  lymphadenopathy, no JVD Lungs: mech breath sounds bl, equal chest rise CV: RRR, no murmur  Abdomen: Soft, non-tender; non-distended, BS present  Extremities: No peripheral edema, warm Skin: Normal turgor and texture; no rash Neuro: Alert and oriented to person and place, no focal deficit   Chest tube tidaling with smaller air leak than yesterday    Resolved Hospital Problem list     Assessment & Plan:  Acute respiratory failure with hypoxia 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 -Continue Solu-Medrol will try to taper fairly quickly in setting persistent air leak mindful of possibility of relapsing pneumonitis -Diurese for net negative fluid balance - full vent support, keep peep <10 as able, DP <15   CAD HTN -continue Coreg, hold if blood pressure soft   Best Practice (right click and "Reselect all SmartList Selections" daily)  Transferred to [facility] arranged, informed critical care team Initiate tube feeds Subcu Lovenox Code Status:  DNR changed to FULL on 2/7 - see progress note from Dr. [name] Last date of multidisciplinary goals of care discussion [he is the caregiver for his wife who had a stroke.  He designates his daughter as HCPOA  Labs   CBC: Recent Labs  Lab 03/16/2022 1600 03/02/22 0445 03/04/22 0520  WBC 15.4* 13.1* 14.4*  NEUTROABS 14.3*  --  13.8*  HGB 13.8 12.6* 12.9*  HCT 40.8 37.8* 37.3*  MCV 89.3 90.2 89.9  PLT 296 254 998    Basic Metabolic Panel: Recent Labs  Lab 02/26/22 0432 02/27/22 0504 03/11/2022 1600 03/02/22 0445 03/04/22 0520  NA 135 136 136 136 135  K 4.4 3.9 3.4* 4.1 4.7  CL 102 107 103 109 105  CO2 '23 22 24 '$ 20* 22  GLUCOSE 199* 221* 154* 183* 176*  BUN 26* 29* 28* 26* 28*  CREATININE 0.86 0.87 0.87 0.77 0.70   CALCIUM 7.6* 7.6* 7.4* 6.9* 7.4*  MG  --   --   --   --  2.2  PHOS  --   --   --   --  3.1   GFR: Estimated Creatinine Clearance: 65.4 mL/min (by C-G formula based on SCr of 0.7 mg/dL). Recent Labs  Lab 03/03/2022 1445 03/04/2022 1600 03/02/22 0445 03/03/22 0356 03/04/22 0520  PROCALCITON  --  <0.10 0.49 0.34  --   WBC  --  15.4* 13.1*  --  14.4*  LATICACIDVEN 2.5* 2.1*  --   --   --     Liver Function Tests: Recent Labs  Lab 02/26/22 0432 02/27/22 0504 03/08/2022 1600  AST '26 25 24  '$ ALT 60* 54* 48*  ALKPHOS 77 83 90  BILITOT 0.8 1.2 1.8*  PROT 5.7* 5.5* 5.8*  ALBUMIN 2.3* 2.3* 2.4*   No results for input(s): "LIPASE", "AMYLASE" in the last 168 hours. No results for input(s): "AMMONIA" in the last 168 hours.  ABG    Component Value Date/Time   PHART 7.41 03/03/2022 1450   PCO2ART 38 03/03/2022 1450   PO2ART 123 (H) 03/03/2022 1450   HCO3 24.1 03/03/2022 1450   TCO2 22 01/13/2017 1610   ACIDBASEDEF 0.4 03/03/2022 1450   O2SAT 99.4 03/03/2022 1450     Coagulation Profile: Recent Labs  Lab 03/21/2022 1445  INR 1.1    Cardiac Enzymes: No results for input(s): "CKTOTAL", "CKMB", "CKMBINDEX", "TROPONINI" in the last 168 hours.  HbA1C: Hgb A1c MFr Bld  Date/Time Value Ref Range Status  02/24/2022 11:30 AM 6.7 (H) 4.8 - 5.6 % Final    Comment:    (NOTE) Pre diabetes:          5.7%-6.4%  Diabetes:              >6.4%  Glycemic control for   <7.0% adults with diabetes   08/10/2021 02:34 PM 5.9 (H) <5.7 % of total Hgb Final    Comment:    For someone without known diabetes, a hemoglobin  A1c value between 5.7% and 6.4% is consistent with prediabetes and should be confirmed with a  follow-up test. . For someone with known diabetes, a value <7% indicates that their diabetes is well controlled. A1c targets should be individualized based on duration of diabetes, age, comorbid conditions, and other considerations. . This assay result is consistent with an  increased risk of diabetes. . Currently, no consensus exists regarding use of hemoglobin A1c for diagnosis of diabetes for children. .     CBG: Recent Labs  Lab 03/03/22 1537 03/03/22 1742 03/03/22 1909 03/03/22 2304 03/04/22 0316  GLUCAP 178* 181* 189* 193* 185*   My independent critical care time was 45 minutes  [place]   If no response to pager , please call [phone] until 7 pm After 7:00 pm call [place]  [phone]   03/04/2022

## 2022-03-04 NOTE — Progress Notes (Addendum)
Initial Nutrition Assessment  DOCUMENTATION CODES:  Non-severe (moderate) malnutrition in context of acute illness/injury  INTERVENTION:  Initiate tube feeding via OGT: Osmolite 1.5 at 50 ml/h (1200 ml per day) Prosource TF20 60 ml 1x/d Provides 1880 kcal, 95 gm protein, 914 ml free water daily Pt at mild risk for refeeding based on malnutrition. Will add thiamine and monitor electrolytes x3 days. MVI with minerals daily, thiamine '100mg'$  x 5 days  NUTRITION DIAGNOSIS:  Moderate Malnutrition related to acute illness as evidenced by mild muscle depletion, severe fat depletion.  GOAL:  Patient will meet greater than or equal to 90% of their needs  MONITOR:  Vent status, Labs, I & O's, TF tolerance  REASON FOR ASSESSMENT:  Consult Enteral/tube feeding initiation and management  ASSESSMENT:  Pt with hx of CAD, HTN, atrial fibrillation, and hx of MI presented to ED with worsening SOB. Found to have a pneumothorax. Several recent admissions noted  2/5 - chest tube placed 2/7 - intubated, transferred to [facility]    Patient is currently intubated on ventilator support. Resting in bed at the time of assessment. No family at bedside. OGT in place with the side port in the mid-stomach per chest XR.   MV: 10.5 L/min Temp (24hrs), Avg:97.6 F (36.4 C), Min:96.4 F (35.8 C), Max:98.5 F (36.9 C)  Propofol: 13.66 ml/hr (361 kcal/d)   Intake/Output Summary (Last 24 hours) at 03/04/2022 0931 Last data filed at 03/04/2022 7782 Gross per 24 hour  Intake 1341.26 ml  Output 825 ml  Net 516.26 ml   Net IO Since Admission: 776.17 mL [03/04/22 0931]  Average Meal Intake: 2/6-2/7: 39% intake x 4 recorded meals prior to intubation  Nutritionally Relevant Medications: Scheduled Meds:  atorvastatin  40 mg Per Tube Daily   docusate  100 mg Per Tube BID   PROSource TF20  60 mL Per Tube Daily   VITAL HIGH PROTEIN  1,000 mL Per Tube Q24H   furosemide  40 mg Intravenous Once   insulin aspart  0-9  Units Subcutaneous Q8H   methylPREDNISolone (SOLU-MEDROL) injection  40 mg Intravenous Q12H   pantoprazole IV  40 mg Intravenous Q12H   polyethylene glycol  17 g Per Tube Daily   Continuous Infusions:  propofol (DIPRIVAN) infusion 30 mcg/kg/min (03/04/22 0600)   PRN Meds: ondansetron  Labs Reviewed: BUN 28 CBG ranges from 177-193 mg/dL over the last 24 hours  NUTRITION - FOCUSED PHYSICAL EXAM: Flowsheet Row Most Recent Value  Orbital Region Severe depletion  Upper Arm Region No depletion  Thoracic and Lumbar Region No depletion  Buccal Region Severe depletion  Temple Region Mild depletion  Clavicle Bone Region Mild depletion  Clavicle and Acromion Bone Region Moderate depletion  Scapular Bone Region Moderate depletion  Dorsal Hand Unable to assess  [mittens]  Patellar Region Mild depletion  Anterior Thigh Region Mild depletion  Posterior Calf Region Moderate depletion  Edema (RD Assessment) None  Hair Reviewed  Eyes Reviewed  Mouth Reviewed  Skin Reviewed  Nails Reviewed   Diet Order:   Diet Order             Diet NPO time specified  Diet effective now                   EDUCATION NEEDS:  Not appropriate for education at this time  Skin:  Skin Assessment: Reviewed RN Assessment  Last BM:  2/6 - type 4  Height:  Ht Readings from Last 1 Encounters:  03/03/22 '5\' 6"'$  (  1.676 m)    Weight:  Wt Readings from Last 1 Encounters:  03/03/22 76.3 kg    Ideal Body Weight:  64.5 kg  BMI:  Body mass index is 27.15 kg/m.  Estimated Nutritional Needs:  Kcal:  1800-2000 kcal/d Protein:  90-105 g/d Fluid:  1.8-2 L/d    Christopher Booth, RD, LDN Clinical Dietitian RD pager # available in [place]  After hours/weekend pager # available in [facility]

## 2022-03-05 ENCOUNTER — Inpatient Hospital Stay (HOSPITAL_COMMUNITY): Payer: PPO

## 2022-03-05 ENCOUNTER — Ambulatory Visit: Payer: Self-pay | Admitting: *Deleted

## 2022-03-05 ENCOUNTER — Inpatient Hospital Stay: Payer: Self-pay

## 2022-03-05 DIAGNOSIS — J9383 Other pneumothorax: Secondary | ICD-10-CM | POA: Diagnosis not present

## 2022-03-05 DIAGNOSIS — E440 Moderate protein-calorie malnutrition: Secondary | ICD-10-CM | POA: Insufficient documentation

## 2022-03-05 DIAGNOSIS — J9601 Acute respiratory failure with hypoxia: Secondary | ICD-10-CM | POA: Diagnosis not present

## 2022-03-05 LAB — GLUCOSE, CAPILLARY
Glucose-Capillary: 289 mg/dL — ABNORMAL HIGH (ref 70–99)
Glucose-Capillary: 223 mg/dL — ABNORMAL HIGH (ref 70–99)
Glucose-Capillary: 179 mg/dL — ABNORMAL HIGH (ref 70–99)
Glucose-Capillary: 195 mg/dL — ABNORMAL HIGH (ref 70–99)

## 2022-03-05 LAB — DIC (DISSEMINATED INTRAVASCULAR COAGULATION)PANEL
D-Dimer, Quant: 5.57 ug/mL-FEU — ABNORMAL HIGH (ref 0.00–0.50)
Fibrinogen: 471 mg/dL (ref 210–475)
INR: 1.2 (ref 0.8–1.2)
Platelets: 210 10*3/uL (ref 150–400)
Prothrombin Time: 15.2 seconds (ref 11.4–15.2)
Smear Review: NONE SEEN
aPTT: 28 seconds (ref 24–36)

## 2022-03-05 LAB — POCT I-STAT 7, (LYTES, BLD GAS, ICA,H+H)
Acid-base deficit: 3 mmol/L — ABNORMAL HIGH (ref 0.0–2.0)
Acid-base deficit: 4 mmol/L — ABNORMAL HIGH (ref 0.0–2.0)
Bicarbonate: 22.8 mmol/L (ref 20.0–28.0)
Bicarbonate: 25.1 mmol/L (ref 20.0–28.0)
Calcium, Ion: 1.17 mmol/L (ref 1.15–1.40)
Calcium, Ion: 1.18 mmol/L (ref 1.15–1.40)
HCT: 34 % — ABNORMAL LOW (ref 39.0–52.0)
HCT: 37 % — ABNORMAL LOW (ref 39.0–52.0)
Hemoglobin: 11.6 g/dL — ABNORMAL LOW (ref 13.0–17.0)
Hemoglobin: 12.6 g/dL — ABNORMAL LOW (ref 13.0–17.0)
O2 Saturation: 87 %
O2 Saturation: 83 %
Potassium: 3.8 mmol/L (ref 3.5–5.1)
Potassium: 5 mmol/L (ref 3.5–5.1)
Sodium: 142 mmol/L (ref 135–145)
Sodium: 141 mmol/L (ref 135–145)
TCO2: 24 mmol/L (ref 22–32)
TCO2: 27 mmol/L (ref 22–32)
pCO2 arterial: 40.6 mmHg (ref 32–48)
pCO2 arterial: 66.5 mmHg (ref 32–48)
pH, Arterial: 7.356 (ref 7.35–7.45)
pH, Arterial: 7.184 — CL (ref 7.35–7.45)
pO2, Arterial: 56 mmHg — ABNORMAL LOW (ref 83–108)
pO2, Arterial: 60 mmHg — ABNORMAL LOW (ref 83–108)

## 2022-03-05 LAB — COMPREHENSIVE METABOLIC PANEL
ALT: 58 U/L — ABNORMAL HIGH (ref 0–44)
AST: 40 U/L (ref 15–41)
Albumin: 1.9 g/dL — ABNORMAL LOW (ref 3.5–5.0)
Alkaline Phosphatase: 79 U/L (ref 38–126)
Anion gap: 8 (ref 5–15)
BUN: 49 mg/dL — ABNORMAL HIGH (ref 8–23)
CO2: 21 mmol/L — ABNORMAL LOW (ref 22–32)
Calcium: 7.2 mg/dL — ABNORMAL LOW (ref 8.9–10.3)
Chloride: 108 mmol/L (ref 98–111)
Creatinine, Ser: 1.1 mg/dL (ref 0.61–1.24)
GFR, Estimated: >60 mL/min (ref >60)
Glucose, Bld: 191 mg/dL — ABNORMAL HIGH (ref 70–99)
Potassium: 5.1 mmol/L (ref 3.5–5.1)
Sodium: 137 mmol/L (ref 135–145)
Total Bilirubin: 1 mg/dL (ref 0.3–1.2)
Total Protein: 5.3 g/dL — ABNORMAL LOW (ref 6.5–8.1)

## 2022-03-05 LAB — CBC
HCT: 35.2 % — ABNORMAL LOW (ref 39.0–52.0)
Hemoglobin: 12.2 g/dL — ABNORMAL LOW (ref 13.0–17.0)
MCH: 30.9 pg (ref 26.0–34.0)
MCHC: 34.7 g/dL (ref 30.0–36.0)
MCV: 89.1 fL (ref 80.0–100.0)
Platelets: 205 10*3/uL (ref 150–400)
RBC: 3.95 MIL/uL — ABNORMAL LOW (ref 4.22–5.81)
RDW: 13.3 % (ref 11.5–15.5)
WBC: 15.8 10*3/uL — ABNORMAL HIGH (ref 4.0–10.5)
nRBC: 0 % (ref 0.0–0.2)

## 2022-03-05 LAB — CULTURE, RESPIRATORY W GRAM STAIN: Gram Stain: NONE SEEN

## 2022-03-05 LAB — BASIC METABOLIC PANEL
Anion gap: 16 — ABNORMAL HIGH (ref 5–15)
BUN: 45 mg/dL — ABNORMAL HIGH (ref 8–23)
CO2: 18 mmol/L — ABNORMAL LOW (ref 22–32)
Calcium: 7.4 mg/dL — ABNORMAL LOW (ref 8.9–10.3)
Chloride: 105 mmol/L (ref 98–111)
Creatinine, Ser: 0.82 mg/dL (ref 0.61–1.24)
GFR, Estimated: >60 mL/min (ref >60)
Glucose, Bld: 249 mg/dL — ABNORMAL HIGH (ref 70–99)
Potassium: 4.1 mmol/L (ref 3.5–5.1)
Sodium: 139 mmol/L (ref 135–145)

## 2022-03-05 LAB — MAGNESIUM
Magnesium: 2.3 mg/dL (ref 1.7–2.4)
Magnesium: 2.2 mg/dL (ref 1.7–2.4)

## 2022-03-05 LAB — LACTIC ACID, PLASMA: Lactic Acid, Venous: 1.6 mmol/L (ref 0.5–1.9)

## 2022-03-05 LAB — HIV ANTIBODY (ROUTINE TESTING W REFLEX): HIV Screen 4th Generation wRfx: NONREACTIVE

## 2022-03-05 LAB — TROPONIN I (HIGH SENSITIVITY): Troponin I (High Sensitivity): 15 ng/L (ref <18)

## 2022-03-05 LAB — SEDIMENTATION RATE: Sed Rate: 13 mm/hr (ref 0–16)

## 2022-03-05 LAB — PHOSPHORUS: Phosphorus: 2.3 mg/dL — ABNORMAL LOW (ref 2.5–4.6)

## 2022-03-05 MED ORDER — SODIUM BICARBONATE 8.4 % IV SOLN: 50.0000 meq | Freq: Once | INTRAVENOUS | Status: DC

## 2022-03-05 MED ORDER — FENTANYL CITRATE PF 50 MCG/ML IJ SOSY: 25.0000 ug | PREFILLED_SYRINGE | Freq: Once | INTRAMUSCULAR | Status: DC

## 2022-03-05 MED ORDER — VASOPRESSIN 20 UNITS/100 ML INFUSION FOR SHOCK: 0.0000 [IU]/min | INTRAVENOUS | Status: DC

## 2022-03-05 MED ORDER — FENTANYL 2500MCG IN NS 250ML (10MCG/ML) PREMIX INFUSION
25.0000 ug/h | INTRAVENOUS | Status: DC
  Administered 2022-03-05: 25 ug/h via INTRAVENOUS
  Filled 2022-03-05: qty 250

## 2022-03-05 MED ORDER — GLYCOPYRROLATE 0.2 MG/ML IJ SOLN: 0.2000 mg | INTRAMUSCULAR | Status: DC | PRN

## 2022-03-05 MED ORDER — GLYCOPYRROLATE 1 MG PO TABS: 1.0000 mg | ORAL_TABLET | ORAL | Status: DC | PRN

## 2022-03-05 MED ORDER — FUROSEMIDE 10 MG/ML IJ SOLN
40.0000 mg | Freq: Once | INTRAMUSCULAR | Status: DC
  Filled 2022-03-05: qty 4

## 2022-03-05 MED ORDER — ONDANSETRON 4 MG PO TBDP: 4.0000 mg | ORAL_TABLET | Freq: Four times a day (QID) | ORAL | Status: DC | PRN

## 2022-03-05 MED ORDER — ROCURONIUM BROMIDE 10 MG/ML (PF) SYRINGE
80.0000 mg | PREFILLED_SYRINGE | INTRAVENOUS | Status: DC | PRN
  Filled 2022-03-05: qty 8

## 2022-03-05 MED ORDER — MIDAZOLAM HCL 2 MG/2ML IJ SOLN
2.0000 mg | INTRAMUSCULAR | Status: DC | PRN
  Administered 2022-03-05: 4 mg via INTRAVENOUS
  Filled 2022-03-05: qty 4

## 2022-03-05 MED ORDER — POLYVINYL ALCOHOL 1.4 % OP SOLN: 1.0000 [drp] | Freq: Four times a day (QID) | OPHTHALMIC | Status: DC | PRN

## 2022-03-05 MED ORDER — METHYLPREDNISOLONE SODIUM SUCC 40 MG IJ SOLR
40.0000 mg | Freq: Two times a day (BID) | INTRAMUSCULAR | Status: DC
  Administered 2022-03-05: 40 mg via INTRAVENOUS
  Filled 2022-03-05: qty 1

## 2022-03-05 MED ORDER — LORAZEPAM 2 MG/ML IJ SOLN
1.0000 mg | INTRAMUSCULAR | Status: DC | PRN
  Administered 2022-03-05: 1 mg via INTRAVENOUS
  Filled 2022-03-05: qty 1

## 2022-03-05 MED ORDER — LACTATED RINGERS IV BOLUS: 500.0000 mL | Freq: Once | INTRAVENOUS | Status: DC

## 2022-03-05 MED ORDER — ACETAMINOPHEN 325 MG PO TABS: 650.0000 mg | ORAL_TABLET | Freq: Four times a day (QID) | ORAL | Status: DC | PRN

## 2022-03-05 MED ORDER — VASOPRESSIN 20 UNITS/100 ML INFUSION FOR SHOCK
INTRAVENOUS | Status: AC
  Administered 2022-03-05: 0.03 [IU]/min via INTRAVENOUS
  Filled 2022-03-05: qty 100

## 2022-03-05 MED ORDER — FENTANYL 2500MCG IN NS 250ML (10MCG/ML) PREMIX INFUSION: 0.0000 ug/h | INTRAVENOUS | Status: DC

## 2022-03-05 MED ORDER — VECURONIUM BROMIDE 10 MG IV SOLR
6.0000 mg | Freq: Once | INTRAVENOUS | Status: AC
  Administered 2022-03-05: 6 mg via INTRAVENOUS

## 2022-03-05 MED ORDER — SODIUM CHLORIDE 0.9 % IV SOLN: INTRAVENOUS | Status: DC

## 2022-03-05 MED ORDER — INSULIN GLARGINE-YFGN 100 UNIT/ML ~~LOC~~ SOLN
10.0000 [IU] | Freq: Every day | SUBCUTANEOUS | Status: DC
  Administered 2022-03-05: 10 [IU] via SUBCUTANEOUS
  Filled 2022-03-05: qty 0.1

## 2022-03-05 MED ORDER — FENTANYL BOLUS VIA INFUSION: 25.0000 ug | INTRAVENOUS | Status: DC | PRN

## 2022-03-05 MED ORDER — ACETAMINOPHEN 650 MG RE SUPP: 650.0000 mg | Freq: Four times a day (QID) | RECTAL | Status: DC | PRN

## 2022-03-05 MED ORDER — DIPHENHYDRAMINE HCL 50 MG/ML IJ SOLN: 25.0000 mg | INTRAMUSCULAR | Status: DC | PRN

## 2022-03-05 MED ORDER — POTASSIUM PHOSPHATES 15 MMOLE/5ML IV SOLN
15.0000 mmol | Freq: Once | INTRAVENOUS | Status: DC
  Administered 2022-03-05: 15 mmol via INTRAVENOUS
  Filled 2022-03-05: qty 5

## 2022-03-05 MED ORDER — FENTANYL BOLUS VIA INFUSION
100.0000 ug | INTRAVENOUS | Status: DC | PRN
  Administered 2022-03-05: 100 ug via INTRAVENOUS

## 2022-03-05 MED ORDER — SODIUM CHLORIDE 0.9 % IV SOLN
1.0000 g | Freq: Three times a day (TID) | INTRAVENOUS | Status: DC
  Administered 2022-03-05: 1 g via INTRAVENOUS
  Filled 2022-03-05: qty 20

## 2022-03-05 MED ORDER — LACTATED RINGERS IV BOLUS
500.0000 mL | Freq: Once | INTRAVENOUS | Status: AC
  Administered 2022-03-05: 500 mL via INTRAVENOUS

## 2022-03-05 MED ORDER — ROCURONIUM BROMIDE 50 MG/5ML IV SOLN: 1.0000 mg/kg | INTRAVENOUS | Status: DC | PRN

## 2022-03-05 MED ORDER — GLYCOPYRROLATE 0.2 MG/ML IJ SOLN
0.2000 mg | INTRAMUSCULAR | Status: DC | PRN
  Administered 2022-03-05: 0.2 mg via INTRAVENOUS
  Filled 2022-03-05: qty 1

## 2022-03-05 MED ORDER — ONDANSETRON HCL 4 MG/2ML IJ SOLN: 4.0000 mg | Freq: Four times a day (QID) | INTRAMUSCULAR | Status: DC | PRN

## 2022-03-05 MED ORDER — VECURONIUM BROMIDE 10 MG IV SOLR
INTRAVENOUS | Status: AC
  Filled 2022-03-05: qty 10

## 2022-03-05 MED ORDER — NOREPINEPHRINE 4 MG/250ML-% IV SOLN
2.0000 ug/min | INTRAVENOUS | Status: DC
  Administered 2022-03-05: 2 ug/min via INTRAVENOUS
  Filled 2022-03-05 (×2): qty 250

## 2022-03-05 MED ORDER — SODIUM CHLORIDE 0.9 % IV SOLN: 250.0000 mL | INTRAVENOUS | Status: DC

## 2022-03-06 LAB — CULTURE, BLOOD (ROUTINE X 2): Culture: NO GROWTH

## 2022-03-06 LAB — ANTI-DNA ANTIBODY, DOUBLE-STRANDED: ds DNA Ab: <1 IU/mL (ref 0–9)

## 2022-03-06 LAB — ANA: Anti Nuclear Antibody (ANA): NEGATIVE

## 2022-03-06 LAB — GLOMERULAR BASEMENT MEMBRANE ANTIBODIES: GBM Ab: <0.2 units (ref 0.0–0.9)

## 2022-03-06 LAB — SJOGRENS SYNDROME-A EXTRACTABLE NUCLEAR ANTIBODY: SSA (Ro) (ENA) Antibody, IgG: <0.2 AI (ref 0.0–0.9)

## 2022-03-06 LAB — SJOGRENS SYNDROME-B EXTRACTABLE NUCLEAR ANTIBODY: SSB (La) (ENA) Antibody, IgG: <0.2 AI (ref 0.0–0.9)

## 2022-03-07 LAB — RHEUMATOID FACTOR: Rheumatoid fact SerPl-aCnc: 10.9 IU/mL (ref <14.0)

## 2022-03-07 LAB — CYCLIC CITRUL PEPTIDE ANTIBODY, IGG/IGA: CCP Antibodies IgG/IgA: 3 units (ref 0–19)

## 2022-03-08 LAB — ANCA TITERS
Atypical P-ANCA titer: <1:20 {titer}
C-ANCA: <1:20 {titer}
P-ANCA: <1:20 {titer}

## 2022-03-11 LAB — PNEUMOCYSTIS PCR: Result Pneumocystis PCR: NEGATIVE

## 2022-03-17 ENCOUNTER — Ambulatory Visit: Payer: PPO | Admitting: Cardiology

## 2022-03-26 NOTE — Progress Notes (Signed)
Critical ABG results given to Dr. Alfonse Spruce. Verbal order received to increase RR to 22. RT will continue to be available as needed.

## 2022-03-26 NOTE — Patient Outreach (Signed)
  Care Coordination   03-09-2022 Name: TREK KIMBALL MRN: [id number] DOB: [birth year]   Patient is currently admitted to [facility] Intensive Care Unit. No discharge plans as of today.    Follow Up Plan:  Additional outreach attempts will be made to offer the patient care coordination information and services. RNCC will monitor TOC list for discharge and follow-up accordingly.   Encounter Outcome:  No Answer   Care Coordination Interventions:  No, not indicated    Chong Sicilian, BSN, RN-BC RN Care Coordinator [place]: [phone] Main #: [phone]

## 2022-03-26 NOTE — Progress Notes (Signed)
PHARMACY ANTIBIOTIC CONSULT NOTE   Christopher Booth a 82 y.o. male admitted with post-COVID ILD. Patient was intubated 2/7 and TA from that time is growing ESBL E coli. Patient has been treated with Cefepime up to this point. Patient is currently afebrile, with WBC 14.4 (on steroids), no evidence of consolidation on Mar 21, 2022 CXR, but patient's oxygenation status is acutely worsening and blood pressure is dropping, necessitating start of vasopressors. Pharmacy has been consulted for meropenem dosing.    Estimated Creatinine Clearance: 63.8 mL/min (by C-G formula based on SCr of 0.82 mg/dL).  Plan: START meropenem 1g IV Q8H x7d (stop date entered)  STOP cefepime  Monitor renal function, clinical status, C/S, de-escalation   Allergies:  Allergies  Allergen Reactions   Diovan [Valsartan] Other (See Comments)    Unknown reaction    Filed Weights   03/16/2022 2040 03/03/22 1745  Weight: 75.9 kg (167 lb 5.3 oz) 76.3 kg (168 lb 3.4 oz)       Latest Ref Rng & Units 21-Mar-2022    9:00 AM 03/04/2022    5:20 AM 03/02/2022    4:45 AM  CBC  WBC 4.0 - 10.5 K/uL  14.4  13.1   Hemoglobin 13.0 - 17.0 g/dL 11.6  12.9  12.6   Hematocrit 39.0 - 52.0 % 34.0  37.3  37.8   Platelets 150 - 400 K/uL  174  254     Antibiotics Given (last 72 hours)     Date/Time Action Medication Dose Rate   03/02/22 1309 New Bag/Given   ceFEPIme (MAXIPIME) 2 g in sodium chloride 0.9 % 100 mL IVPB 2 g 200 mL/hr   03/02/22 1508 New Bag/Given   vancomycin (VANCOREADY) IVPB 1500 mg/300 mL 1,500 mg 150 mL/hr   03/02/22 2153 New Bag/Given   ceFEPIme (MAXIPIME) 2 g in sodium chloride 0.9 % 100 mL IVPB 2 g 200 mL/hr   03/03/22 0534 New Bag/Given   ceFEPIme (MAXIPIME) 2 g in sodium chloride 0.9 % 100 mL IVPB 2 g 200 mL/hr   03/03/22 1521 New Bag/Given   ceFEPIme (MAXIPIME) 2 g in sodium chloride 0.9 % 100 mL IVPB 2 g 200 mL/hr   03/03/22 1823 New Bag/Given   vancomycin (VANCOREADY) IVPB 1500 mg/300 mL 1,500 mg 150 mL/hr   03/03/22  2134 New Bag/Given   ceFEPIme (MAXIPIME) 2 g in sodium chloride 0.9 % 100 mL IVPB 2 g 200 mL/hr   03/04/22 0516 New Bag/Given   ceFEPIme (MAXIPIME) 2 g in sodium chloride 0.9 % 100 mL IVPB 2 g 200 mL/hr   03/04/22 1448 New Bag/Given   ceFEPIme (MAXIPIME) 2 g in sodium chloride 0.9 % 100 mL IVPB 2 g 200 mL/hr   03/04/22 2113 New Bag/Given   ceFEPIme (MAXIPIME) 2 g in sodium chloride 0.9 % 100 mL IVPB 2 g 200 mL/hr   March 21, 2022 0520 New Bag/Given   ceFEPIme (MAXIPIME) 2 g in sodium chloride 0.9 % 100 mL IVPB 2 g 200 mL/hr       Antimicrobials this admission: Vanco 2/5>>2/7 Cefepime 2/5>>2/7  Meropenem 2/9>>(2/16)   Microbiology results: 2/5 Bcx: NGTD  2/7 TA: ESBL E coli  2/7 MRSA PCR: not detected   Thank you for allowing pharmacy to be a part of this patient's care.  Adria Dill, PharmD PGY-2 Infectious Diseases Resident  03-21-2022 10:40 AM

## 2022-03-26 NOTE — Inpatient Diabetes Management (Signed)
Inpatient Diabetes Program Recommendations  AACE/ADA: New Consensus Statement on Inpatient Glycemic Control (2015)  Target Ranges:  Prepandial:   less than 140 mg/dL      Peak postprandial:   less than 180 mg/dL (1-2 hours)      Critically ill patients:  140 - 180 mg/dL   Lab Results  Component Value Date   GLUCAP 223 (H) 03-14-22   HGBA1C 6.7 (H) 02/24/2022    Review of Glycemic Control  Latest Reference Range & Units 03/04/22 07:32 03/04/22 11:19 03/04/22 15:20 03/04/22 19:10 03/04/22 20:07 03/04/22 23:11 14-Mar-2022 03:06 03/14/2022 07:26  Glucose-Capillary 70 - 99 mg/dL 177 (H) 157 (H) 202 (H) 253 (H) 308 (H) 258 (H) 289 (H) 223 (H)  (H): Data is abnormally high  COVID/pneumothorax Diabetes history: DM 2 Outpatient Diabetes medications: Tradjenta 5 mg Daily Current orders for Inpatient glycemic control:  Semglee 10 units QD Novolog 0-9 units Q4H, Solumedrol 40 mg Q12H, Osmolite @ 50 ml/hr  Inpatient Diabetes Program Recommendations:    Glucose is elevated with tube feeds.  Semglee added today.  May also consider,  Novolog 3 units Q4H tube feeds coverage (hold if feeds held or discontinued).  Will continue to follow while inpatient.  Thank you, Reche Dixon, MSN, [place] Diabetes Coordinator Inpatient Diabetes Program [phone] (team pager from 8a-5p)

## 2022-03-26 NOTE — Progress Notes (Addendum)
   Interdisciplinary Goals of Care Family Meeting   Date carried out: 03-25-22  Location of the meeting: Bedside  Member's involved: Daughter, resident, pver phone  Durable Power of Attorney or acting medical decision maker: daugther Christopher Booth    Discussion: We discussed goals of care for Christopher Booth .  Heading into refractory septic shock  Code status:   Code Status: DNR   Disposition: Continue current acute care  +  she di dNOT consent to CVL. She wants to come to bedside and then decide. She told resident earlire is DNR  STat abg, cxr, trop, lactate, Procalcbc, bmet, DIC panel ordered + CXR  Time spent for the meeting: 15 min over phone     SIGNATURE    Dr. Brand Males, M.D., F.C.C.P,  Pulmonary and Critical Care Medicine Staff Physician, [place] Director - Interstitial Lung Disease  Program  Medical Director - [facility] [place], [place], [address]   Pager: [phone], If no answer  -[place] or [place] [phone] Telephone (clinical office): [phone] Telephone (research): [phone]  2:00 PM March 25, 2022

## 2022-03-26 NOTE — Discharge Summary (Signed)
DISCHARGE SUMMARY    Date of admit: 03/17/2022  1:54 PM Date of discharge: 2022/03/10  8:24 PM Length of Stay: 4 days  PCP is Susy Frizzle, MD  CAUSE(S) OF DEATH  Septic shock due to Hospital Acquired Pneumonia - ESBL E colii    PROBLEM LIST ACtive problems   Spontaneous pneumothorax   Essential hypertension, benign   Sepsis ([place])   Hypokalemia   HAP (hospital-acquired pneumonia)   COVID-19 virus infection   Acute respiratory failure with hypoxia (HCC)   Malnutrition of moderate degree Post-COVID ILD and acute lung injury DNR Comfort Care/Terminal Care     Present on Admission Present on Admission:  Spontaneous pneumothorax  Sepsis ([place])  HAP (hospital-acquired pneumonia)  Essential hypertension, benign  Acute respiratory failure with hypoxia (HCC)  Hypokalemia  COVID-19 virus infection   Active Problems Principal Problem:   Spontaneous pneumothorax Active Problems:   Essential hypertension, benign   Sepsis ([place])   Hypokalemia   HAP (hospital-acquired pneumonia)   COVID-19 virus infection   Acute respiratory failure with hypoxia (HCC)   Malnutrition of moderate degree   Resolved Problems Active Hospital Problems   Diagnosis Date Noted   Spontaneous pneumothorax 03/12/2022   Malnutrition of moderate degree 03/10/2022   HAP (hospital-acquired pneumonia) 02/14/2022   Acute respiratory failure with hypoxia ([place]) 02/14/2022   Hypokalemia 02/14/2022   COVID-19 virus infection 02/14/2022   Sepsis ([place]) 02/13/2022   Essential hypertension, benign 03/29/2011    Resolved Hospital Problems  No resolved problems to display.     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   Lumbar radiculopathy 09/20/2019   Body mass index (BMI) 26.0-26.9, adult 09/13/2019   Foot-drop 09/13/2019   Neurogenic claudication 09/13/2019   Elevated glucose 05/28/2019   S/P CABG x 2    Coronary artery disease 01/12/2017   NSTEMI (non-ST elevated myocardial infarction) ([place]) 01/11/2017   Pain in thumb joint with movement of right hand 01/01/2015   PVCs (premature ventricular contractions) 03/29/2011   Precordial pain 03/29/2011   Nonspecific abnormal electrocardiogram (ECG) (EKG) 03/29/2011   Essential hypertension, benign 03/29/2011   Mixed hyperlipidemia 03/29/2011      SUMMARY Christopher Booth was 82 y.o. patient with    has a past medical history of Adrenal mass ([place]), Arthritis, CAD (coronary artery disease), Essential hypertension, Gynecomastia, Hyperaldosteronism, Hypertension, Lumbar radiculopathy, Myocardial infarction ([place]), Pneumonia, Postoperative atrial fibrillation ([place]), Pre-diabetes, Spinal stenosis, Temporomandibular joint disease, and Wears glasses.   has a past surgical history that includes Colonoscopy; Cervical fusion (1993); Cholecystectomy (1995); Tonsillectomy; Knee arthroscopy (Right, 10/09/2013); Cataract extraction w/PHACO (Right, 01/28/2015); Cataract extraction w/PHACO (Left, 04/14/2015); LEFT HEART CATH AND CORONARY ANGIOGRAPHY (N/A, 01/11/2017); Coronary artery bypass graft (N/A, 01/12/2017); TEE without cardioversion (N/A, 01/12/2017); Eye surgery (N/A); Vasectomy (N/A); Lumbar laminectomy/decompression microdiscectomy (Right, 09/20/2019); Robotic adrenalectomy (Right, 10/17/2019); and Adrenal gland surgery (10/17/2019).   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  Left pigtail  was placed with partial expansion of left lung.  Placed on 6 L nasal cannula and then on nonrebreather and admitted to ICU. He was initially hospitalized 1/21 to 1/23 for COVID infection/pneumonia, given 3 days of baricitinib, CT angio chest showed bibasilar infiltrates He was again hospitalized 1/28 to 2/4, CT chest showing bilateral diffuse airspace disease and groundglass infiltrates, discharged on 5 L of oxygen with prolonged steroid taper starting at 80 mg prednisone   COURSE   2/5 left pigtail placed by general surgery 03/02/22 - needing 7L HFNC 03/03/22 intubated, transferred to [facility] from [place]. Antibitics being continued. Remained DNR -> later in day full code. Cocnern for HAP and cefepime cntinued. Resp culture sent 2/8 - developed hypotension coirculatory shock and pressors started 03/26/22 refractory shock. Cultures from 2/7 gorwing ESBL E Colii. On vent. HIgh fio2 needs 100%. Later in day: Code status changed to DNR. Family conversations: Comfort care. Terminal wean and patient expired  03/26/22 -date of death   SIGNED Dr. Brand Males, M.D., [facility].C.P Pulmonary and Critical Care Medicine Staff Physician [place] [facility] Pager: [phone], If no answer or between  15:00h - 7:00h: call [phone]  03/10/2022 6:29 AM

## 2022-03-26 NOTE — Progress Notes (Signed)
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  Left pigtail was placed with partial expansion of left lung.  Placed on 6 L nasal cannula and then on nonrebreather and admitted to ICU He was initially hospitalized 1/21 to 1/23 for COVID infection/pneumonia, given 3 days of baricitinib, CT angio chest showed bibasilar infiltrates He was again hospitalized 1/28 to 2/4, CT chest showing bilateral diffuse airspace disease and groundglass infiltrates, discharged on 5 L of oxygen with prolonged steroid taper starting at 80 mg prednisone  Pertinent  Medical History  CABG 2018 Left adrenalectomy Hyperaldosteronism Hypertension  Significant Hospital Events: Including procedures, antibiotic start and stop dates in addition to other pertinent events   2/5 left pigtail placed by general surgery 03/03/22 intubated, transferred to [facility]   Interim History / Subjective:  He was started on Neo-Synephrine overnight due to titrating up sensation  Weaning 1 hour yesterday. Intubated and sedated.  Objective   Blood pressure (!) 88/58, pulse 92, temperature 98.7 F (37.1 C), temperature source Axillary, resp. rate 15, height 5' 6"$  (1.676 m), weight 76.3 kg, SpO2 95 %.    Vent Mode: PRVC FiO2 (%):  [40 %-80 %] 80 % Set Rate:  [16 bmp] 16 bmp Vt Set:  [510 mL] 510 mL PEEP:  [5 cmH20] 5 cmH20 Pressure Support:  [5 cmH20] 5 cmH20   Intake/Output Summary (Last 24 hours) at 03/29/2022 0751 Last data filed at March 29, 2022 0700 Gross per 24 hour  Intake 1660.02 ml  Output 2940 ml  Net -1279.98 ml    Filed Weights   03/23/2022 2040 03/03/22 1745  Weight: 75.9 kg 76.3 kg    Examination: General appearance: 82  y.o., male,intubated  Eyes: Not opening eyes Neck: Trachea midline; no lymphadenopathy Lungs: mech breath sounds bl, equal chest rise.  Mild expiratory wheezing CV: RRR, no murmur  Abdomen: Soft, non-tender; non-distended, BS present  Extremities: +1 bilateral lower extremity edema Skin: Normal turgor and texture; no rash Neuro: Sedated  Gtt: Neo 35, 50 prop PRVC 16/510/5/60 ABG 7.4/36  Resolved Hospital Problem list     Assessment & Plan:  Acute respiratory failure with hypoxia Fibroproliferative ARDS in setting recent covid-19 pneumonitis Left pneumothorax with persistent air leak Possible pneumomediastinum Minimal output from chest tube -Continue empiric cefepime for HAP. Pending Res Cx -Steroid taper to pred 50 mg daily -Diurese for net negative fluid balance - full vent support, keep peep <10 as able, DP <15.  Ending if able. -If patient continues to have persistent air leak, he may need surgical procedure. -CXR today  Shock In the setting of IV sedation -Neo-Synephrine with goal MAP greater than 65  CAD s/p CABG HTN -Hold Coreg, losartan and eplerenone -Continue Lipitor and ASA -Can give 1 more dose of lasix pending AM BMP. Total net -500cc  Type 2 diabetes Hyperglycemia in the setting of steroids A1c 6.7 -Added Semglee 10 units daily  Best Practice (right click and "Reselect all SmartList Selections" daily)  Subcu Lovenox Code Status:  DNR changed to FULL on 2/7 - see progress note from Dr. [name] No central line Foley Last date of multidisciplinary goals of care discussion [name] is the caregiver for his wife who had a stroke.  He designates his daughter  as HCPOA  Labs   CBC: Recent Labs  Lab 02/27/2022 1600 03/02/22 0445 03/04/22 0520  WBC 15.4* 13.1* 14.4*  NEUTROABS 14.3*  --  13.8*  HGB 13.8 12.6* 12.9*  HCT 40.8 37.8* 37.3*  MCV 89.3 90.2 89.9  PLT 296 254 174     Basic Metabolic Panel: Recent Labs  Lab 02/27/22 0504 02/28/2022 1600  03/02/22 0445 03/04/22 0520 03/04/22 0820 03/04/22 1641 03/07/22 0553  NA 136 136 136 135  --   --   --   K 3.9 3.4* 4.1 4.7  --   --   --   CL 107 103 109 105  --   --   --   CO2 22 24 20* 22  --   --   --   GLUCOSE 221* 154* 183* 176*  --   --   --   BUN 29* 28* 26* 28*  --   --   --   CREATININE 0.87 0.87 0.77 0.70  --   --   --   CALCIUM 7.6* 7.4* 6.9* 7.4*  --   --   --   MG  --   --   --  2.2 2.2 2.1 2.3  PHOS  --   --   --  3.1 3.0 3.3 2.3*    GFR: Estimated Creatinine Clearance: 65.4 mL/min (by C-G formula based on SCr of 0.7 mg/dL). Recent Labs  Lab 03/13/2022 1445 02/28/2022 1600 03/02/22 0445 03/03/22 0356 03/04/22 0520  PROCALCITON  --  <0.10 0.49 0.34  --   WBC  --  15.4* 13.1*  --  14.4*  LATICACIDVEN 2.5* 2.1*  --   --   --      Liver Function Tests: Recent Labs  Lab 02/27/22 0504 02/28/2022 1600  AST 25 24  ALT 54* 48*  ALKPHOS 83 90  BILITOT 1.2 1.8*  PROT 5.5* 5.8*  ALBUMIN 2.3* 2.4*    No results for input(s): "LIPASE", "AMYLASE" in the last 168 hours. No results for input(s): "AMMONIA" in the last 168 hours.  ABG    Component Value Date/Time   PHART 7.4 03/04/2022 2300   PCO2ART 36 03/04/2022 2300   PO2ART 78 (L) 03/04/2022 2300   HCO3 22.4 03/04/2022 2300   TCO2 22 01/13/2017 1610   ACIDBASEDEF 2.2 (H) 03/04/2022 2300   O2SAT 96.8 03/04/2022 2300     Coagulation Profile: Recent Labs  Lab 02/28/2022 1445  INR 1.1     Cardiac Enzymes: No results for input(s): "CKTOTAL", "CKMB", "CKMBINDEX", "TROPONINI" in the last 168 hours.  HbA1C: Hgb A1c MFr Bld  Date/Time Value Ref Range Status  02/24/2022 11:30 AM 6.7 (H) 4.8 - 5.6 % Final    Comment:    (NOTE) Pre diabetes:          5.7%-6.4%  Diabetes:              >6.4%  Glycemic control for   <7.0% adults with diabetes   08/10/2021 02:34 PM 5.9 (H) <5.7 % of total Hgb Final    Comment:    For someone without known diabetes, a hemoglobin  A1c value between 5.7% and 6.4% is  consistent with prediabetes and should be confirmed with a  follow-up test. . For someone with known diabetes, a value <7% indicates that their diabetes is well controlled. A1c targets should be individualized based on duration of diabetes, age, comorbid conditions, and other considerations. . This assay result is consistent with an increased risk  of diabetes. . Currently, no consensus exists regarding use of hemoglobin A1c for diagnosis of diabetes for children. .     CBG: Recent Labs  Lab 03/04/22 1910 03/04/22 2007 03/04/22 2311 Mar 21, 2022 0306 March 21, 2022 0726  GLUCAP Nicolaus, DO Internal Medicine Residency My pager: [phone]

## 2022-03-26 NOTE — IPAL (Signed)
I called and spoke to patient's daughter, Nathaniel Man on the phone.  I updated her on the current progress.  Patient had a increase in oxygen requirement and required more sedation for ventilator dyssynchrony.  His pneumothorax is improved so the main underlying cause is ILD and ESBL pneumonia.   Olivia Mackie states that patient does not wish to be on life support.  She would like to make him DNR and continue to monitor for the next few days.  If he is unable to be weaned off of the ventilator, we will have another goals of care conversation.  All questions were answered.  Code status: DNR

## 2022-03-26 NOTE — Procedures (Signed)
Extubation Procedure Note  Patient Details:   Name: Christopher Booth DOB: 1940/04/19 MRN: [id number]   Airway Documentation:    Vent end date: 03/08/22 Vent end time: 1615   Evaluation  O2 sats: currently acceptable Complications: No apparent complications Patient did tolerate procedure well. Bilateral Breath Sounds: Clear, Diminished   No  Patient extubated for withdrawal of care to comfort care.  No complications.   Judith Part 03-08-2022, 4:20 PM

## 2022-03-26 NOTE — Progress Notes (Signed)
ABG results given to Dr. Alfonse Spruce. FiO2 increased to 100% and peep increased to 8. RT will continue to monitor and be available as needed.

## 2022-03-26 NOTE — Progress Notes (Signed)
This chaplain responded to RN-Gloria page for EOL spiritual care.  The Pt. daughter, son in law, and two friends are at the bedside. The family accepted the chaplain's invitation for storytelling and prayer. Smiles and laughter accompanied the memories.  The chaplain understands the Pt. enthusiasm and dedication allowed him to enjoy many hobbies and a curious lifestyle surrounded by family and friends.    The chaplain is available for F/U spiritual care as needed.  Chaplain Sallyanne Kuster [phone]

## 2022-03-26 NOTE — IPAL (Signed)
  Interdisciplinary Goals of Care Family Meeting     Date carried out: 03-29-22   Location of the meeting: Bedside   Member's involved: Daughter, daughter's husband, Lesle Reek RN   Durable Power of Attorney or acting medical decision maker: daugther Nathaniel Man     Discussion: I updated family on patient's current status.  Patient is critically ill with septic shock from ESBL pneumonia, requiring maximal support from ventilator.  pO2 of 60 on 100% FiO2.  He is requiring 2 vasopressors. Olivia Mackie understands that he is high risk for decompensation.  She states that patient made it clear for her that he would not want to be kept on life support.  It is unlikely that he would have a meaningful recovery after this event, back to his prior functional status. Olivia Mackie would like to transition to comfort care to alleviate any further suffering.   Code status:   Code Status: DNR    Disposition: Transition to comfort care per family request.   Time spent for the meeting: [place] min   Gaylan Gerold, DO Internal Medicine Residency My pager: [phone]

## 2022-03-26 DEATH — deceased

## 2022-11-15 ENCOUNTER — Encounter: Payer: PPO | Admitting: Family Medicine

## 2023-01-08 IMAGING — MR MR ABDOMEN WO/W CM
21 series · 48 of 48 positions shown · IV contrast (gadavist)
Comparison: MR abdomen, 08/01/2019, CT abdomen, 07/06/2019,
11/17/2010

CLINICAL DATA: Left adrenal mass, status post right adrenalectomy

EXAM:
MRI ABDOMEN WITHOUT AND WITH CONTRAST
TECHNIQUE: Multiplanar multisequence MR imaging of the abdomen was performed
both before and after the administration of intravenous contrast.
CONTRAST:  7mL GADAVIST GADOBUTROL 1 MMOL/ML IV SOLN

[Series 3: cor haste · coronal · 6.0mm · 1.19mm/px · 1 of 30 slices shown]
[im 1/30]
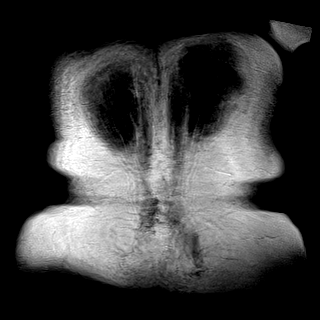

[Series 4: ax haste · axial · 6.0mm · 1.19mm/px · 1 of 30 slices shown]
[im 1/30]
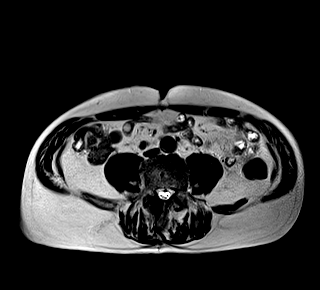

[Series 5: T2 fat-sat · axial · 6.0mm · 1.19mm/px · 1 of 30 slices shown]
[im 1/30]
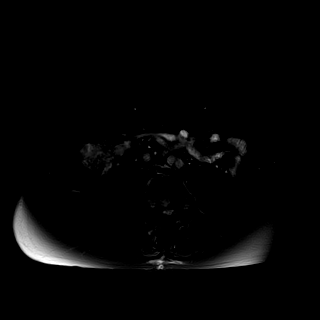

[Series 6: ax in and · axial · 3.0mm · 1.19mm/px · z∈[-196,+41]mm · 2 of 80 slices shown (1 of 2)]
[im 1/80]
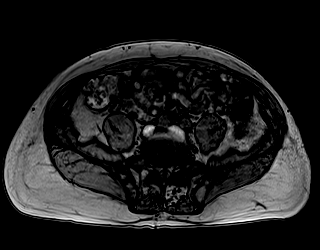
[im 80/80]
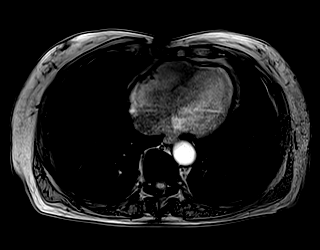

[Series 7: ax in and · axial · 3.0mm · 1.19mm/px · z∈[-196,+41]mm · 3 of 80 slices shown (2 of 2)]
[im 1/80]
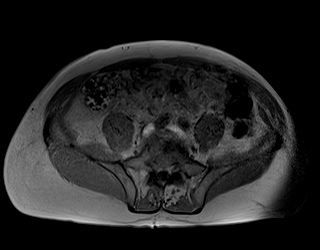
[im 40/80]
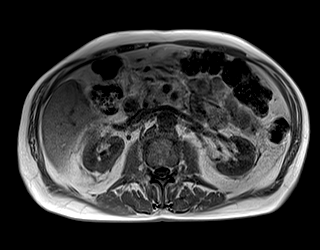
[im 80/80]
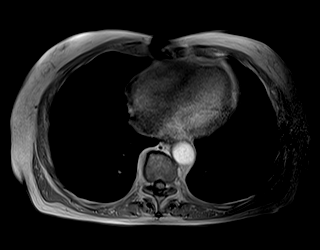

[Series 8: cor in-opp_opp · coronal · 3.0mm · 1.19mm/px · 3 of 72 slices shown]
[im 1/72]
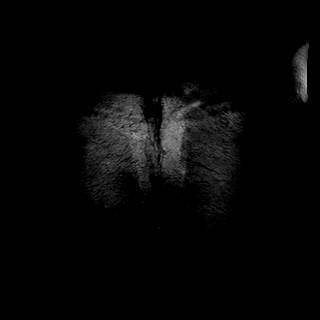
[im 36/72]
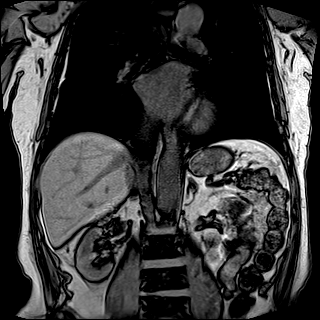
[im 72/72]
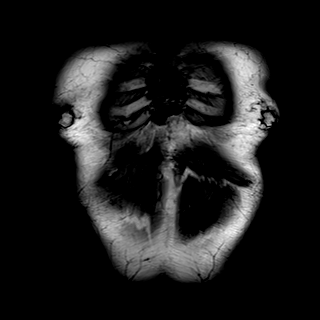

[Series 9: cor in-opp_in · coronal · 3.0mm · 1.19mm/px · 3 of 72 slices shown]
[im 1/72]
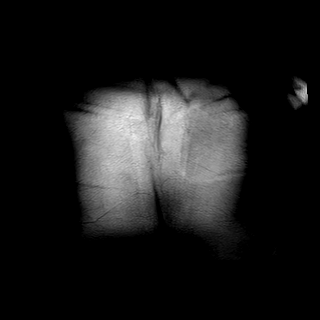
[im 36/72]
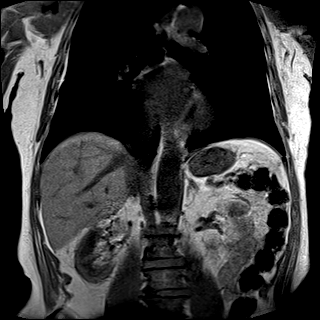
[im 72/72]
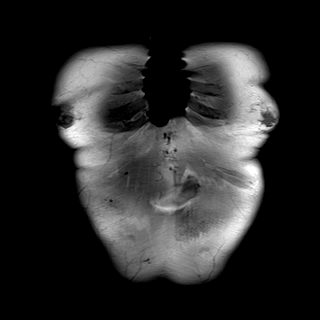

[Series 10: DWI · axial · 6.0mm · 1.42mm/px · 1 of 30 slices shown (1 of 4)]
[im 1/30]
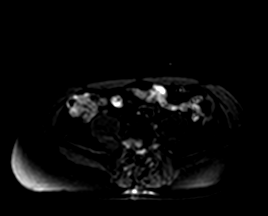

[Series 10: DWI · axial · 6.0mm · 1.42mm/px · 1 of 30 slices shown (2 of 4)]
[im 1/30]
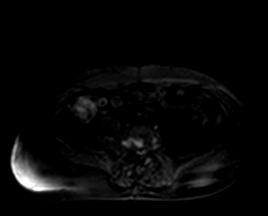

[Series 10: DWI · axial · 6.0mm · 1.42mm/px · 1 of 30 slices shown (3 of 4)]
[im 1/30]
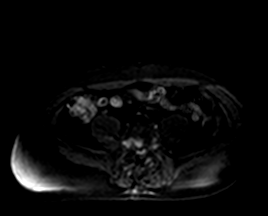

[Series 11: DWI · axial · 6.0mm · 1.42mm/px · 1 of 30 slices shown (4 of 4)]
[im 1/30]
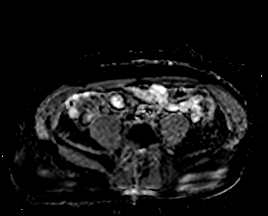

[Series 12: t1_vibe_fs_tra_p4_bh_pre · axial · 3.0mm · 1.19mm/px · z∈[-198,+15]mm · 3 of 72 slices shown]
[im 1/72]
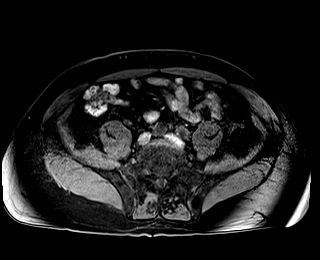
[im 36/72]
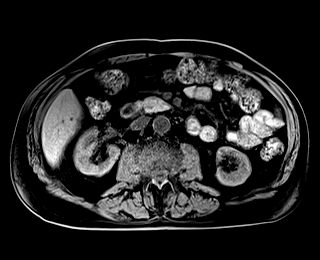
[im 72/72]
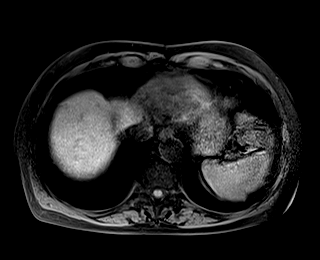

[Series 14: t1_vibe_fs_tra_p4_bh_post · axial · 3.0mm · 1.19mm/px · z∈[-198,+15]mm · 3 of 72 slices shown (1 of 4)]
[im 1/72]
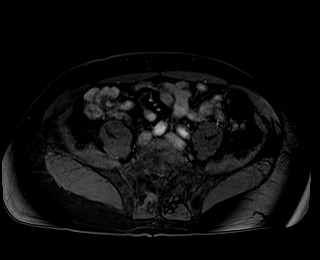
[im 36/72]
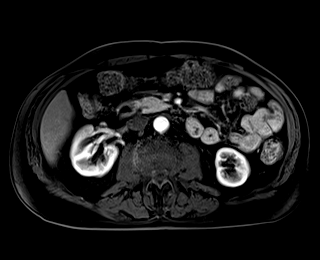
[im 72/72]
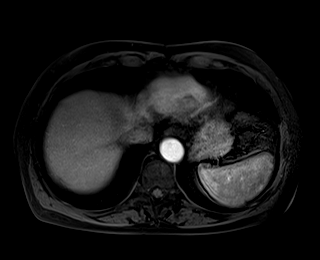

[Series 15: t1_vibe_fs_tra_p4_bh_post_sub · axial · 3.0mm · 1.19mm/px · z∈[-198,+15]mm · 3 of 72 slices shown (1 of 4)]
[im 1/72]
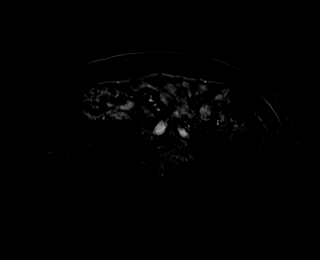
[im 36/72]
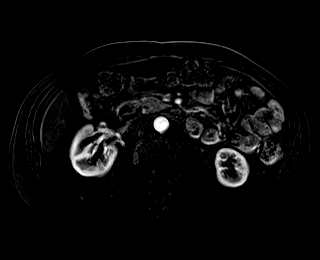
[im 72/72]
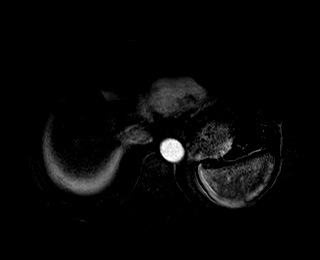

[Series 16: t1_vibe_fs_tra_p4_bh_post · axial · 3.0mm · 1.19mm/px · z∈[-198,+15]mm · 3 of 72 slices shown (2 of 4)]
[im 1/72]
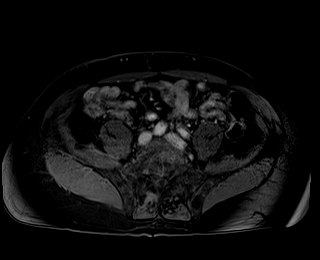
[im 36/72]
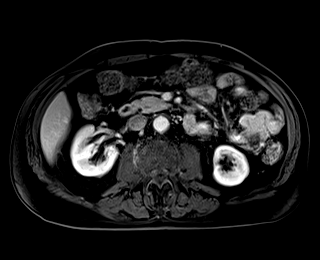
[im 72/72]
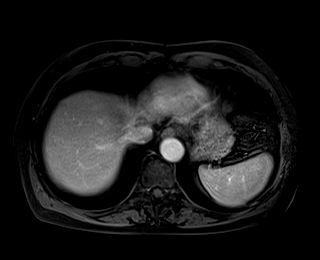

[Series 17: t1_vibe_fs_tra_p4_bh_post_sub · axial · 3.0mm · 1.19mm/px · z∈[-198,+15]mm · 3 of 72 slices shown (2 of 4)]
[im 1/72]
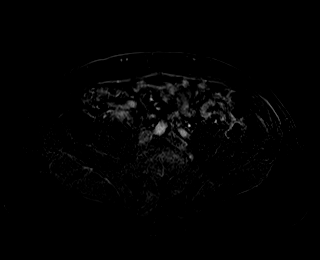
[im 36/72]
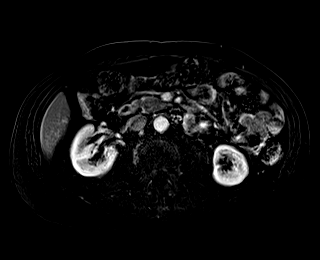
[im 72/72]
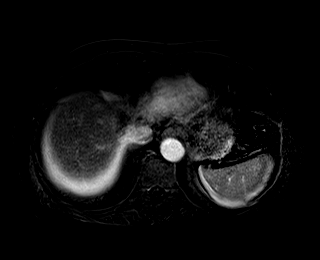

[Series 18: t1_vibe_fs_tra_p4_bh_post · axial · 3.0mm · 1.19mm/px · z∈[-198,+15]mm · 3 of 72 slices shown (3 of 4)]
[im 1/72]
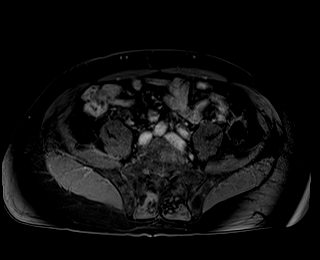
[im 36/72]
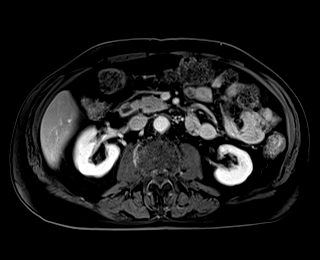
[im 72/72]
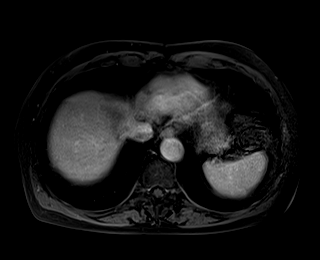

[Series 19: t1_vibe_fs_tra_p4_bh_post_sub · axial · 3.0mm · 1.19mm/px · z∈[-198,+15]mm · 3 of 72 slices shown (3 of 4)]
[im 1/72]
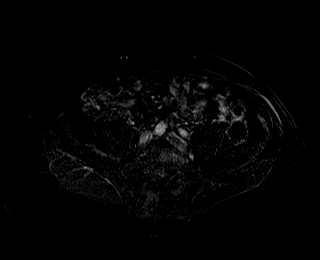
[im 36/72]
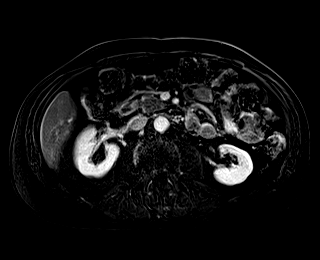
[im 72/72]
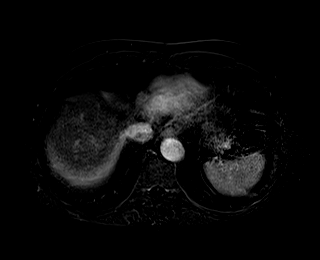

[Series 20: t1_vibe_fs_tra_p4_bh_post · axial · 3.0mm · 1.19mm/px · z∈[-198,+15]mm · 3 of 72 slices shown (4 of 4)]
[im 1/72]
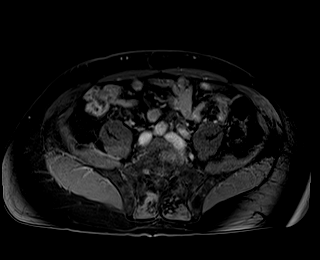
[im 36/72]
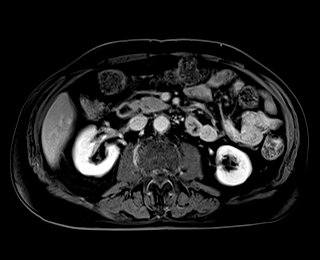
[im 72/72]
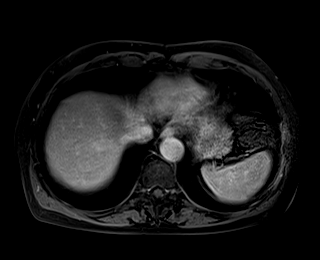

[Series 21: t1_vibe_fs_tra_p4_bh_post_sub · axial · 3.0mm · 1.19mm/px · z∈[-198,+15]mm · 3 of 72 slices shown (4 of 4)]
[im 1/72]
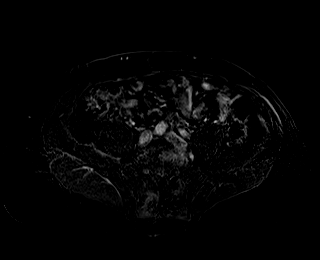
[im 36/72]
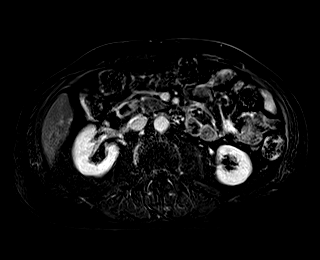
[im 72/72]
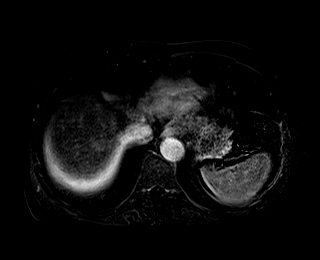

[Series 22: T1 dynamic post-contrast · coronal · 3.0mm · 1.31mm/px · 3 of 72 slices shown]
[im 1/72]
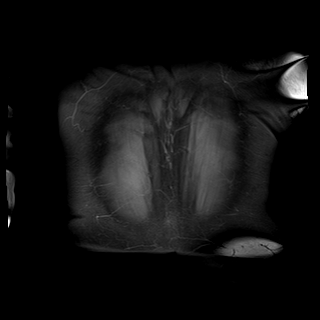
[im 36/72]
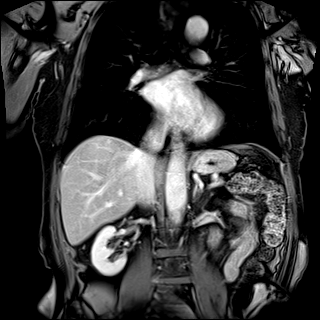
[im 72/72]
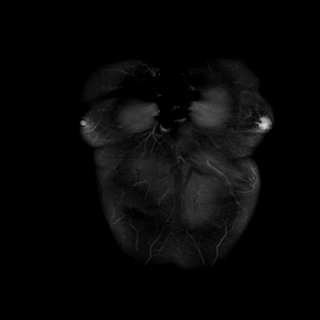

[48 of 48 positions shown; findings below may reference images not displayed]

FINDINGS: Lower chest: No acute findings.

Hepatobiliary: No mass or other parenchymal abnormality identified.
Status post cholecystectomy. No biliary ductal dilatation.

Pancreas: No mass, inflammatory changes, or other parenchymal
abnormality identified. No pancreatic ductal dilatation.

Spleen:  Within normal limits in size and appearance.

Adrenals/Urinary Tract: Status post interval right adrenalectomy. No
evidence of locally recurrent soft tissue. A 1.1 cm left adrenal
nodule demonstrates subtle macroscopic fat signal dropout on opposed
phase imaging (series 6, image 37) and is stable in comparison to
multiple prior examinations dating back to 8828. The kidneys are
normal. No evidence of hydronephrosis.

Stomach/Bowel: Visualized portions within the abdomen are
unremarkable.

Vascular/Lymphatic: No pathologically enlarged lymph nodes
identified. No abdominal aortic aneurysm demonstrated.

Other:  None.

Musculoskeletal: No suspicious bone lesions identified.
IMPRESSION: 1. Status post interval right adrenalectomy. No evidence of locally
recurrent soft tissue.
2. A 1.1 cm left adrenal nodule demonstrates subtle macroscopic fat
signal dropout on opposed phase imaging and is stable in comparison
to multiple prior examinations dating back to 8828. This is
consistent with a definitively benign adenoma.
3. Status post cholecystectomy.
